# Patient Record
Sex: Male | Born: 1980 | Race: White | Hispanic: No | Marital: Single | Smoking: Current every day smoker
Health system: Southern US, Community
[De-identification: ages and names within clinical notes are randomized; demographics above are authoritative.]

## PROBLEM LIST (undated history)

## (undated) DIAGNOSIS — F17200 Nicotine dependence, unspecified, uncomplicated: Secondary | ICD-10-CM

## (undated) DIAGNOSIS — M549 Dorsalgia, unspecified: Secondary | ICD-10-CM

## (undated) DIAGNOSIS — G8929 Other chronic pain: Secondary | ICD-10-CM

## (undated) DIAGNOSIS — F1910 Other psychoactive substance abuse, uncomplicated: Secondary | ICD-10-CM

## (undated) DIAGNOSIS — B1920 Unspecified viral hepatitis C without hepatic coma: Secondary | ICD-10-CM

## (undated) DIAGNOSIS — F319 Bipolar disorder, unspecified: Secondary | ICD-10-CM

## (undated) HISTORY — DX: Nicotine dependence, unspecified, uncomplicated: F17.200

## (undated) HISTORY — PX: MANDIBLE FRACTURE SURGERY: SHX706

## (undated) HISTORY — PX: ARM AMPUTATION: SUR21

---

## 2013-12-13 ENCOUNTER — Emergency Department (HOSPITAL_COMMUNITY): Payer: Self-pay

## 2013-12-13 ENCOUNTER — Inpatient Hospital Stay (HOSPITAL_COMMUNITY): Payer: MEDICAID

## 2013-12-13 ENCOUNTER — Inpatient Hospital Stay (HOSPITAL_COMMUNITY): Payer: Self-pay

## 2013-12-13 ENCOUNTER — Inpatient Hospital Stay (HOSPITAL_COMMUNITY)
Admission: EM | Admit: 2013-12-13 | Discharge: 2013-12-18 | DRG: 917 | Disposition: A | Discharge status: Home / Self Care | Payer: Self-pay | Attending: Pulmonary Disease | Admitting: Pulmonary Disease

## 2013-12-13 ENCOUNTER — Encounter (HOSPITAL_COMMUNITY): Payer: Self-pay | Admitting: Emergency Medicine

## 2013-12-13 DIAGNOSIS — J690 Pneumonitis due to inhalation of food and vomit: Secondary | ICD-10-CM | POA: Diagnosis present

## 2013-12-13 DIAGNOSIS — R7309 Other abnormal glucose: Secondary | ICD-10-CM | POA: Diagnosis present

## 2013-12-13 DIAGNOSIS — E876 Hypokalemia: Secondary | ICD-10-CM | POA: Diagnosis present

## 2013-12-13 DIAGNOSIS — D649 Anemia, unspecified: Secondary | ICD-10-CM | POA: Diagnosis present

## 2013-12-13 DIAGNOSIS — T40601A Poisoning by unspecified narcotics, accidental (unintentional), initial encounter: Secondary | ICD-10-CM | POA: Diagnosis present

## 2013-12-13 DIAGNOSIS — D696 Thrombocytopenia, unspecified: Secondary | ICD-10-CM | POA: Diagnosis present

## 2013-12-13 DIAGNOSIS — J9600 Acute respiratory failure, unspecified whether with hypoxia or hypercapnia: Secondary | ICD-10-CM | POA: Diagnosis present

## 2013-12-13 DIAGNOSIS — J9690 Respiratory failure, unspecified, unspecified whether with hypoxia or hypercapnia: Secondary | ICD-10-CM

## 2013-12-13 DIAGNOSIS — J019 Acute sinusitis, unspecified: Secondary | ICD-10-CM | POA: Diagnosis present

## 2013-12-13 DIAGNOSIS — T401X1A Poisoning by heroin, accidental (unintentional), initial encounter: Secondary | ICD-10-CM

## 2013-12-13 DIAGNOSIS — F1110 Opioid abuse, uncomplicated: Secondary | ICD-10-CM | POA: Diagnosis present

## 2013-12-13 DIAGNOSIS — J9621 Acute and chronic respiratory failure with hypoxia: Secondary | ICD-10-CM

## 2013-12-13 DIAGNOSIS — E8809 Other disorders of plasma-protein metabolism, not elsewhere classified: Secondary | ICD-10-CM | POA: Diagnosis present

## 2013-12-13 DIAGNOSIS — F329 Major depressive disorder, single episode, unspecified: Secondary | ICD-10-CM | POA: Diagnosis present

## 2013-12-13 DIAGNOSIS — E872 Acidosis, unspecified: Secondary | ICD-10-CM | POA: Diagnosis present

## 2013-12-13 DIAGNOSIS — B1920 Unspecified viral hepatitis C without hepatic coma: Secondary | ICD-10-CM | POA: Diagnosis present

## 2013-12-13 DIAGNOSIS — F3289 Other specified depressive episodes: Secondary | ICD-10-CM | POA: Diagnosis present

## 2013-12-13 DIAGNOSIS — G9340 Encephalopathy, unspecified: Secondary | ICD-10-CM | POA: Diagnosis present

## 2013-12-13 DIAGNOSIS — I10 Essential (primary) hypertension: Secondary | ICD-10-CM | POA: Diagnosis present

## 2013-12-13 DIAGNOSIS — T401X4A Poisoning by heroin, undetermined, initial encounter: Principal | ICD-10-CM | POA: Diagnosis present

## 2013-12-13 DIAGNOSIS — I959 Hypotension, unspecified: Secondary | ICD-10-CM | POA: Diagnosis present

## 2013-12-13 LAB — URINALYSIS, ROUTINE W REFLEX MICROSCOPIC
Bilirubin Urine: NEGATIVE
Hgb urine dipstick: NEGATIVE
KETONES UR: NEGATIVE mg/dL
LEUKOCYTES UA: NEGATIVE
NITRITE: NEGATIVE
PH: 5.5 (ref 5.0–8.0)
Protein, ur: NEGATIVE mg/dL
SPECIFIC GRAVITY, URINE: 1.022 (ref 1.005–1.030)
Urobilinogen, UA: 0.2 mg/dL (ref 0.0–1.0)

## 2013-12-13 LAB — COMPREHENSIVE METABOLIC PANEL
ALT: 29 U/L (ref 0–53)
AST: 51 U/L — ABNORMAL HIGH (ref 0–37)
Albumin: 3.5 g/dL (ref 3.5–5.2)
Alkaline Phosphatase: 74 U/L (ref 39–117)
BUN: 15 mg/dL (ref 6–23)
CO2: 23 meq/L (ref 19–32)
Calcium: 8.8 mg/dL (ref 8.4–10.5)
Chloride: 95 mEq/L — ABNORMAL LOW (ref 96–112)
Creatinine, Ser: 1.12 mg/dL (ref 0.50–1.35)
GFR calc non Af Amer: 84 mL/min — ABNORMAL LOW (ref >90)
Glucose, Bld: 221 mg/dL — ABNORMAL HIGH (ref 70–99)
POTASSIUM: 3.7 meq/L (ref 3.7–5.3)
Sodium: 136 mEq/L — ABNORMAL LOW (ref 137–147)
TOTAL PROTEIN: 6.5 g/dL (ref 6.0–8.3)
Total Bilirubin: 0.3 mg/dL (ref 0.3–1.2)

## 2013-12-13 LAB — CBC WITH DIFFERENTIAL/PLATELET
Basophils Absolute: 0 10*3/uL (ref 0.0–0.1)
Basophils Relative: 0 % (ref 0–1)
EOS ABS: 0.3 10*3/uL (ref 0.0–0.7)
EOS PCT: 2 % (ref 0–5)
HCT: 45.5 % (ref 39.0–52.0)
HEMOGLOBIN: 15.8 g/dL (ref 13.0–17.0)
LYMPHS PCT: 39 % (ref 12–46)
Lymphs Abs: 4.6 10*3/uL — ABNORMAL HIGH (ref 0.7–4.0)
MCH: 31.9 pg (ref 26.0–34.0)
MCHC: 34.7 g/dL (ref 30.0–36.0)
MCV: 91.7 fL (ref 78.0–100.0)
MONOS PCT: 8 % (ref 3–12)
Monocytes Absolute: 0.9 10*3/uL (ref 0.1–1.0)
Neutro Abs: 6.2 10*3/uL (ref 1.7–7.7)
Neutrophils Relative %: 51 % (ref 43–77)
Platelets: 218 10*3/uL (ref 150–400)
RBC: 4.96 MIL/uL (ref 4.22–5.81)
RDW: 12.4 % (ref 11.5–15.5)
WBC: 12 10*3/uL — AB (ref 4.0–10.5)

## 2013-12-13 LAB — I-STAT ARTERIAL BLOOD GAS, ED
ACID-BASE EXCESS: 1 mmol/L (ref 0.0–2.0)
BICARBONATE: 28.6 meq/L — AB (ref 20.0–24.0)
O2 SAT: 100 %
PO2 ART: 271 mmHg — AB (ref 80.0–100.0)
Patient temperature: 98.6
TCO2: 30 mmol/L (ref 0–100)
pCO2 arterial: 54.6 mmHg — ABNORMAL HIGH (ref 35.0–45.0)
pH, Arterial: 7.327 — ABNORMAL LOW (ref 7.350–7.450)

## 2013-12-13 LAB — GLUCOSE, CAPILLARY
GLUCOSE-CAPILLARY: 188 mg/dL — AB (ref 70–99)
GLUCOSE-CAPILLARY: 71 mg/dL (ref 70–99)
GLUCOSE-CAPILLARY: 88 mg/dL (ref 70–99)

## 2013-12-13 LAB — TROPONIN I
Troponin I: <0.3 ng/mL (ref <0.30)
Troponin I: <0.3 ng/mL (ref <0.30)
Troponin I: <0.3 ng/mL (ref <0.30)

## 2013-12-13 LAB — URINE MICROSCOPIC-ADD ON

## 2013-12-13 LAB — RAPID URINE DRUG SCREEN, HOSP PERFORMED
Amphetamines: NOT DETECTED
BARBITURATES: NOT DETECTED
BENZODIAZEPINES: NOT DETECTED
Cocaine: NOT DETECTED
Opiates: POSITIVE — AB
Tetrahydrocannabinol: NOT DETECTED

## 2013-12-13 LAB — MRSA PCR SCREENING: MRSA BY PCR: NEGATIVE

## 2013-12-13 LAB — LACTIC ACID, PLASMA
LACTIC ACID, VENOUS: 5.3 mmol/L — AB (ref 0.5–2.2)
LACTIC ACID, VENOUS: 1.6 mmol/L (ref 0.5–2.2)
Lactic Acid, Venous: 1.5 mmol/L (ref 0.5–2.2)

## 2013-12-13 MED ORDER — MIDAZOLAM HCL 2 MG/2ML IJ SOLN
INTRAMUSCULAR | Status: AC
  Administered 2013-12-13: 2 mg via INTRAVENOUS
  Filled 2013-12-13: qty 2

## 2013-12-13 MED ORDER — PANTOPRAZOLE SODIUM 40 MG IV SOLR
40.0000 mg | Freq: Every day | INTRAVENOUS | Status: DC
  Administered 2013-12-13 – 2013-12-14 (×2): 40 mg via INTRAVENOUS
  Filled 2013-12-13 (×4): qty 40

## 2013-12-13 MED ORDER — SODIUM CHLORIDE 0.9 % IV SOLN
INTRAVENOUS | Status: DC
  Administered 2013-12-13: 12:00:00 via INTRAVENOUS
  Administered 2013-12-13: 1000 mL via INTRAVENOUS
  Administered 2013-12-13 – 2013-12-14 (×2): via INTRAVENOUS
  Administered 2013-12-14: 1000 mL via INTRAVENOUS
  Administered 2013-12-15 – 2013-12-16 (×3): via INTRAVENOUS

## 2013-12-13 MED ORDER — PROPOFOL 10 MG/ML IV EMUL
INTRAVENOUS | Status: AC
  Administered 2013-12-13: 10 ug/kg/min via INTRAVENOUS
  Filled 2013-12-13: qty 100

## 2013-12-13 MED ORDER — SODIUM CHLORIDE 0.9 % IV SOLN
25.0000 ug/h | INTRAVENOUS | Status: DC
  Administered 2013-12-13: 100 ug/h via INTRAVENOUS
  Filled 2013-12-13 (×2): qty 50

## 2013-12-13 MED ORDER — MIDAZOLAM HCL 5 MG/ML IJ SOLN: 4.0000 mg | INTRAMUSCULAR | Status: DC | PRN

## 2013-12-13 MED ORDER — BIOTENE DRY MOUTH MT LIQD
15.0000 mL | Freq: Four times a day (QID) | OROMUCOSAL | Status: DC
  Administered 2013-12-13 – 2013-12-17 (×16): 15 mL via OROMUCOSAL

## 2013-12-13 MED ORDER — ENOXAPARIN SODIUM 40 MG/0.4ML ~~LOC~~ SOLN
40.0000 mg | SUBCUTANEOUS | Status: DC
  Administered 2013-12-13 – 2013-12-15 (×3): 40 mg via SUBCUTANEOUS
  Filled 2013-12-13 (×4): qty 0.4

## 2013-12-13 MED ORDER — MIDAZOLAM HCL 2 MG/2ML IJ SOLN
INTRAMUSCULAR | Status: AC
  Administered 2013-12-13: 2 mg
  Filled 2013-12-13: qty 8

## 2013-12-13 MED ORDER — METOPROLOL TARTRATE 1 MG/ML IV SOLN: 2.5000 mg | INTRAVENOUS | Status: DC | PRN

## 2013-12-13 MED ORDER — ACETAMINOPHEN 160 MG/5ML PO SOLN
650.0000 mg | Freq: Once | ORAL | Status: AC
  Administered 2013-12-13: 650 mg
  Filled 2013-12-13: qty 20.3

## 2013-12-13 MED ORDER — SODIUM CHLORIDE 0.9 % IV SOLN: 250.0000 mL | INTRAVENOUS | Status: DC | PRN

## 2013-12-13 MED ORDER — FENTANYL CITRATE 0.05 MG/ML IJ SOLN
100.0000 ug | INTRAMUSCULAR | Status: DC | PRN
  Administered 2013-12-14 – 2013-12-16 (×12): 100 ug via INTRAVENOUS
  Filled 2013-12-13 (×13): qty 2

## 2013-12-13 MED ORDER — PROPOFOL 10 MG/ML IV EMUL
5.0000 ug/kg/min | Freq: Once | INTRAVENOUS | Status: AC
  Administered 2013-12-13: 10 ug/kg/min via INTRAVENOUS

## 2013-12-13 MED ORDER — NALOXONE HCL 0.4 MG/ML IJ SOLN
0.4000 mg | Freq: Once | INTRAMUSCULAR | Status: AC
  Administered 2013-12-13: 0.4 mg via INTRAVENOUS

## 2013-12-13 MED ORDER — INSULIN ASPART 100 UNIT/ML ~~LOC~~ SOLN
0.0000 [IU] | SUBCUTANEOUS | Status: DC
  Administered 2013-12-15 (×2): 2 [IU] via SUBCUTANEOUS

## 2013-12-13 MED ORDER — MIDAZOLAM HCL 2 MG/2ML IJ SOLN
INTRAMUSCULAR | Status: AC
  Administered 2013-12-13: 4 mg
  Filled 2013-12-13: qty 4

## 2013-12-13 MED ORDER — CHLORHEXIDINE GLUCONATE 0.12 % MT SOLN
15.0000 mL | Freq: Two times a day (BID) | OROMUCOSAL | Status: DC
  Administered 2013-12-13 – 2013-12-17 (×6): 15 mL via OROMUCOSAL
  Filled 2013-12-13 (×7): qty 15

## 2013-12-13 MED ORDER — ETOMIDATE 2 MG/ML IV SOLN: 0.3000 mg/kg | Freq: Once | INTRAVENOUS | Status: DC

## 2013-12-13 MED ORDER — MIDAZOLAM HCL 2 MG/2ML IJ SOLN
2.0000 mg | Freq: Once | INTRAMUSCULAR | Status: AC
  Administered 2013-12-13: 2 mg via INTRAVENOUS

## 2013-12-13 MED ORDER — ETOMIDATE 2 MG/ML IV SOLN
20.0000 mg | Freq: Once | INTRAVENOUS | Status: AC
  Administered 2013-12-13: 20 mg via INTRAVENOUS

## 2013-12-13 MED ORDER — SODIUM CHLORIDE 0.9 % IV SOLN
Freq: Once | INTRAVENOUS | Status: AC
  Administered 2013-12-13: 10:00:00 via INTRAVENOUS

## 2013-12-13 MED ORDER — ROCURONIUM BROMIDE 50 MG/5ML IV SOLN
1.0000 mg/kg | Freq: Once | INTRAVENOUS | Status: AC
  Administered 2013-12-13: 70 mg via INTRAVENOUS

## 2013-12-13 MED ORDER — PROPOFOL 10 MG/ML IV EMUL
INTRAVENOUS | Status: AC
  Administered 2013-12-13: 20 ug/kg/min via INTRAVENOUS
  Filled 2013-12-13: qty 100

## 2013-12-13 MED ORDER — PROPOFOL 10 MG/ML IV EMUL
5.0000 ug/kg/min | INTRAVENOUS | Status: DC
  Administered 2013-12-13: 50 ug/kg/min via INTRAVENOUS
  Administered 2013-12-13 (×2): 70 ug/kg/min via INTRAVENOUS
  Administered 2013-12-13: 20 ug/kg/min via INTRAVENOUS
  Administered 2013-12-14: 50 ug/kg/min via INTRAVENOUS
  Administered 2013-12-14 (×3): 70 ug/kg/min via INTRAVENOUS
  Administered 2013-12-14: 40 ug/kg/min via INTRAVENOUS
  Administered 2013-12-14: 70 ug/kg/min via INTRAVENOUS
  Administered 2013-12-15: 60 ug/kg/min via INTRAVENOUS
  Administered 2013-12-15 (×3): 70 ug/kg/min via INTRAVENOUS
  Administered 2013-12-15: 30 ug/kg/min via INTRAVENOUS
  Administered 2013-12-15 – 2013-12-16 (×4): 70 ug/kg/min via INTRAVENOUS
  Filled 2013-12-13 (×21): qty 100

## 2013-12-13 NOTE — Progress Notes (Signed)
Pt transported to 2M11, report given to RT. No apparent complications noted.
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
## 2013-12-13 NOTE — ED Notes (Signed)
Propofol turned down to 6010mcg/kg from 30 mcg/kg to to drop in B/p Iv fluids infusing at this time
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
## 2013-12-13 NOTE — ED Notes (Signed)
Friends of pt at bedside , updated on pt status

## 2013-12-14 ENCOUNTER — Encounter (HOSPITAL_COMMUNITY): Payer: Self-pay | Admitting: Certified Registered Nurse Anesthetist

## 2013-12-14 ENCOUNTER — Inpatient Hospital Stay (HOSPITAL_COMMUNITY): Payer: Self-pay

## 2013-12-14 DIAGNOSIS — J690 Pneumonitis due to inhalation of food and vomit: Secondary | ICD-10-CM

## 2013-12-14 LAB — CBC
HEMATOCRIT: 42.9 % (ref 39.0–52.0)
Hemoglobin: 14.6 g/dL (ref 13.0–17.0)
MCH: 30.9 pg (ref 26.0–34.0)
MCHC: 34 g/dL (ref 30.0–36.0)
MCV: 90.9 fL (ref 78.0–100.0)
Platelets: 136 10*3/uL — ABNORMAL LOW (ref 150–400)
RBC: 4.72 MIL/uL (ref 4.22–5.81)
RDW: 12.6 % (ref 11.5–15.5)
WBC: 21.5 10*3/uL — ABNORMAL HIGH (ref 4.0–10.5)

## 2013-12-14 LAB — BASIC METABOLIC PANEL
BUN: 11 mg/dL (ref 6–23)
CO2: 22 mEq/L (ref 19–32)
Calcium: 8.4 mg/dL (ref 8.4–10.5)
Chloride: 105 mEq/L (ref 96–112)
Creatinine, Ser: 0.85 mg/dL (ref 0.50–1.35)
Glucose, Bld: 80 mg/dL (ref 70–99)
Potassium: 4 mEq/L (ref 3.7–5.3)
SODIUM: 140 meq/L (ref 137–147)

## 2013-12-14 LAB — TRIGLYCERIDES: TRIGLYCERIDES: 112 mg/dL (ref <150)

## 2013-12-14 LAB — GLUCOSE, CAPILLARY
GLUCOSE-CAPILLARY: 93 mg/dL (ref 70–99)
GLUCOSE-CAPILLARY: 125 mg/dL — AB (ref 70–99)
GLUCOSE-CAPILLARY: 94 mg/dL (ref 70–99)
Glucose-Capillary: 103 mg/dL — ABNORMAL HIGH (ref 70–99)
Glucose-Capillary: 79 mg/dL (ref 70–99)
Glucose-Capillary: 87 mg/dL (ref 70–99)

## 2013-12-14 LAB — BLOOD GAS, ARTERIAL
Acid-Base Excess: 2 mmol/L (ref 0.0–2.0)
Bicarbonate: 25.8 mEq/L — ABNORMAL HIGH (ref 20.0–24.0)
DRAWN BY: 10006
FIO2: 40 %
LHR: 16 {breaths}/min
O2 Saturation: 92.4 %
PCO2 ART: 40.5 mmHg (ref 35.0–45.0)
PEEP: 5 cmH2O
PH ART: 7.426 (ref 7.350–7.450)
Patient temperature: 100.3
TCO2: 27 mmol/L (ref 0–100)
VT: 640 mL
pO2, Arterial: 64.6 mmHg — ABNORMAL LOW (ref 80.0–100.0)

## 2013-12-14 LAB — LACTIC ACID, PLASMA: LACTIC ACID, VENOUS: 1.8 mmol/L (ref 0.5–2.2)

## 2013-12-14 LAB — PHOSPHORUS
PHOSPHORUS: 1.4 mg/dL — AB (ref 2.3–4.6)
Phosphorus: 1.8 mg/dL — ABNORMAL LOW (ref 2.3–4.6)
Phosphorus: 1.5 mg/dL — ABNORMAL LOW (ref 2.3–4.6)

## 2013-12-14 LAB — MAGNESIUM
Magnesium: 1.8 mg/dL (ref 1.5–2.5)
Magnesium: 2.4 mg/dL (ref 1.5–2.5)
Magnesium: 2 mg/dL (ref 1.5–2.5)

## 2013-12-14 LAB — STREP PNEUMONIAE URINARY ANTIGEN: STREP PNEUMO URINARY ANTIGEN: NEGATIVE

## 2013-12-14 LAB — PROCALCITONIN: PROCALCITONIN: 3.72 ng/mL

## 2013-12-14 MED ORDER — VANCOMYCIN HCL IN DEXTROSE 1-5 GM/200ML-% IV SOLN
1000.0000 mg | Freq: Three times a day (TID) | INTRAVENOUS | Status: DC
  Administered 2013-12-15 – 2013-12-16 (×4): 1000 mg via INTRAVENOUS
  Filled 2013-12-14 (×6): qty 200

## 2013-12-14 MED ORDER — ACETAMINOPHEN 160 MG/5ML PO SOLN
650.0000 mg | Freq: Four times a day (QID) | ORAL | Status: DC | PRN
  Administered 2013-12-14 – 2013-12-15 (×3): 650 mg
  Filled 2013-12-14 (×3): qty 20.3

## 2013-12-14 MED ORDER — PRO-STAT SUGAR FREE PO LIQD
30.0000 mL | Freq: Two times a day (BID) | ORAL | Status: DC
  Filled 2013-12-14 (×2): qty 30

## 2013-12-14 MED ORDER — SODIUM PHOSPHATE 3 MMOLE/ML IV SOLN
10.0000 mmol | Freq: Once | INTRAVENOUS | Status: AC
  Administered 2013-12-14: 10 mmol via INTRAVENOUS
  Filled 2013-12-14: qty 3.33

## 2013-12-14 MED ORDER — LEVOFLOXACIN IN D5W 750 MG/150ML IV SOLN
750.0000 mg | INTRAVENOUS | Status: DC
  Administered 2013-12-14 – 2013-12-17 (×4): 750 mg via INTRAVENOUS
  Filled 2013-12-14 (×5): qty 150

## 2013-12-14 MED ORDER — VANCOMYCIN HCL 10 G IV SOLR
1750.0000 mg | Freq: Once | INTRAVENOUS | Status: AC
  Administered 2013-12-14: 1750 mg via INTRAVENOUS
  Filled 2013-12-14: qty 1750

## 2013-12-14 MED ORDER — VITAL HIGH PROTEIN PO LIQD
1000.0000 mL | ORAL | Status: DC
  Filled 2013-12-14 (×2): qty 1000

## 2013-12-14 MED ORDER — MAGNESIUM SULFATE 40 MG/ML IJ SOLN
2.0000 g | Freq: Once | INTRAMUSCULAR | Status: AC
  Administered 2013-12-14: 2 g via INTRAVENOUS
  Filled 2013-12-14: qty 50

## 2013-12-14 MED ORDER — VITAL AF 1.2 CAL PO LIQD
1000.0000 mL | ORAL | Status: DC
  Administered 2013-12-14 – 2013-12-15 (×2): 1000 mL
  Filled 2013-12-14 (×7): qty 1000

## 2013-12-14 NOTE — Clinical Social Work Note (Signed)
CSW met with fiance and pt/fiance friend Pi in conference room on 37M.  Assessment to follow.  Nonnie Done, [place] [phone]  Clinical Social Work

## 2013-12-14 NOTE — Progress Notes (Signed)
ANTIBIOTIC CONSULT NOTE - INITIAL  Pharmacy Consult for Vancomcyin Indication:  Sepsis and ongoing Fevers  Allergies  Allergen Reactions  . Penicillins     Unknown-childhood allergy   Patient Measurements: Height: 6\' 1"  (185.4 cm) Weight: 178 lb 5.6 oz (80.9 kg) IBW/kg (Calculated) : 79.9  Vital Signs: Temp: 100.1 F (37.8 C) (04/09 1500) Temp src: Core (Comment) (04/09 1200) BP: 123/65 mmHg (04/09 1531) Pulse Rate: 81 (04/09 1531) Intake/Output from previous day: 04/08 0701 - 04/09 0700 In: 2895.1 [I.V.:2895.1] Out: 1375 [Urine:1375] Intake/Output from this shift: Total I/O In: 1703.1 [I.V.:1199.8; NG/GT:50; IV Piggyback:453.3] Out: 655 [Urine:655]  Labs:  Recent Labs  12/13/13 0956 12/14/13 0411  WBC 12.0* 21.5*  HGB 15.8 14.6  PLT 218 136*  CREATININE 1.12 0.85   Estimated Creatinine Clearance: 138.4 ml/min (by C-G formula based on Cr of 0.85).  Microbiology: Recent Results (from the past 720 hour(s))  MRSA PCR SCREENING     Status: None   Collection Time    12/13/13 12:26 PM      Result Value Ref Range Status   MRSA by PCR NEGATIVE  NEGATIVE Final   Comment:            The GeneXpert MRSA Assay (FDA     approved for NASAL specimens     only), is one component of a     comprehensive MRSA colonization     surveillance program. It is not     intended to diagnose MRSA     infection nor to guide or     monitor treatment for     MRSA infections.   Medical History: Past Medical History  Diagnosis Date  . Smoker     Medications:  Anti-infectives   Start     Dose/Rate Route Frequency Ordered Stop   12/14/13 1545  vancomycin (VANCOCIN) 1,750 mg in sodium chloride 0.9 % 500 mL IVPB     1,750 mg 250 mL/hr over 120 Minutes Intravenous  Once 12/14/13 1536     12/14/13 1130  levofloxacin (LEVAQUIN) IVPB 750 mg     750 mg 100 mL/hr over 90 Minutes Intravenous Every 24 hours 12/14/13 1057       Assessment: 32yo male with multiple medical problems that  continues to have fever.  He has had IV levofloxacin today around 11AM.  We have been asked to broaden his IV antibiotics to include Vancomycin.  He has a creatinine of 0.85 and an estimated crcl of 138 ml/min.  He continues to have some leukocytosis with WBC = 21.5K.  He remains intubated with recent cxr findings of bilateral lung infiltrates.  Goal of Therapy:  Vancomycin trough level 15-20 mcg/ml  Plan:  1.  Vancomycin 1750 mg IV x 1 2.  Begin Vancomycin 1000 mg every 8 hours 3.  Will check s/s level and adjust 4.  Monitor renal function  Nadara MustardNita Wisam Siefring, PharmD., MS Clinical Pharmacist Pager:  [phone] Thank you for allowing pharmacy to be part of this patients care team. 12/14/2013,3:50 PM

## 2013-12-14 NOTE — Progress Notes (Signed)
INITIAL NUTRITION ASSESSMENT  DOCUMENTATION CODES Per approved criteria  -Not Applicable   INTERVENTION:  Utilize 47M PEPuP Protocol: initiate TF via OGT with Vital AF 1.2 at 25 ml/h on day 1; on day 2, increase to goal rate of 65 ml/h (1560 ml per day) to provide 1872 kcals, 117 gm protein, 1265 ml free water daily.  Above TF regimen plus current propofol will provide a total of 2503 kcals, 117 gm protein, 1265 ml free water daily.  NUTRITION DIAGNOSIS: Inadequate oral intake related to inability to eat as evidenced by NPO status.   Goal: Intake to meet >90% of estimated nutrition needs.  Monitor:  TF tolerance/adequacy, weight trend, labs, vent status.  Reason for Assessment: MD Consult for TF initiation and management.  33 y.o. male  Admitting Dx: suspected heroin overdose  ASSESSMENT: Patient is a 33 yo M with unknown PMH brought to ED on 4/8 after his girlfriend found him unresponsive, suspected heroine overdose. In ED received 2 doses of Narcan without significant improvement and was intubated.   Patient is currently intubated on ventilator support. Received MD Consult for TF initiation and management. Nutrition focused physical exam completed.  No muscle or subcutaneous fat depletion noticed. MV: 9.1 L/min Temp (24hrs), Avg:101 F (38.3 C), Min:98.4 F (36.9 C), Max:104.6 F (40.3 C)  Propofol: 23.9 ml/hr providing 631 kcals/day.  Height: Ht Readings from Last 1 Encounters:  12/13/13 6\' 1"  (1.854 m)    Weight: Wt Readings from Last 1 Encounters:  12/14/13 178 lb 5.6 oz (80.9 kg)    Ideal Body Weight: 83.6 kg  % Ideal Body Weight: 97%  Wt Readings from Last 10 Encounters:  12/14/13 178 lb 5.6 oz (80.9 kg)    Usual Body Weight: unknown  % Usual Body Weight: N/A  BMI:  Body mass index is 23.54 kg/(m^2).  Estimated Nutritional Needs: Kcal: 2540 Protein: 115-130 gm Fluid: 2.3-2.5 L  Skin: no wounds  Diet Order: NPO  EDUCATION NEEDS: -Education  not appropriate at this time   Intake/Output Summary (Last 24 hours) at 12/14/13 1119 Last data filed at 12/14/13 1000  Gross per 24 hour  Intake 3178.4 ml  Output   1510 ml  Net 1668.4 ml    Last BM: PTA   Labs:   Recent Labs Lab 12/13/13 0956 12/14/13 0411  NA 136* 140  K 3.7 4.0  CL 95* 105  CO2 23 22  BUN 15 11  CREATININE 1.12 0.85  CALCIUM 8.8 8.4  MG  --  1.8  PHOS  --  1.8*  GLUCOSE 221* 80    CBG (last 3)   Recent Labs  12/14/13 0024 12/14/13 0413 12/14/13 0837  GLUCAP 87 103* 79    Scheduled Meds: . antiseptic oral rinse  15 mL Mouth Rinse QID  . chlorhexidine  15 mL Mouth Rinse BID  . enoxaparin (LOVENOX) injection  40 mg Subcutaneous Q24H  . feeding supplement (PRO-STAT SUGAR FREE 64)  30 mL Per Tube BID  . feeding supplement (VITAL HIGH PROTEIN)  1,000 mL Per Tube Q24H  . insulin aspart  0-15 Units Subcutaneous 6 times per day  . levofloxacin (LEVAQUIN) IV  750 mg Intravenous Q24H  . magnesium sulfate 1 - 4 g bolus IVPB  2 g Intravenous Once  . pantoprazole (PROTONIX) IV  40 mg Intravenous QHS  . sodium phosphate  Dextrose 5% IVPB  10 mmol Intravenous Once    Continuous Infusions: . sodium chloride 1,000 mL (12/14/13 0820)  . propofol  40 mcg/kg/min (12/14/13 0831)    Past Medical History  Diagnosis Date  . Smoker     History reviewed. No pertinent past surgical history.   Joaquin Courts, RD, LDN, CNSC Pager [phone] After Hours Pager [phone]
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
## 2013-12-14 NOTE — Progress Notes (Signed)
PULMONARY / CRITICAL CARE MEDICINE   Name: Oscar Burke MRN: [id number] DOB: 03-Sep-1981    ADMISSION DATE:  12/13/2013 CONSULTATION DATE:  12/13/2013  REFERRING MD : EDP PRIMARY SERVICE: PCCM  CHIEF COMPLAINT:  Suspected heroine overdose  BRIEF PATIENT DESCRIPTION: 33 yo M with unknown PMH brought to ED after his girlfriend found him unresponsive, suspected heroine overdose. In ED received 2 doses of Narcan without significant improvement and was intubated. PCCM was consulted.  SIGNIFICANT EVENTS / STUDIES:  4/8 Admitted for suspected heroine overdose, responded to narcan. Hypertensive with persistent fever(Tmax 104.6) 4/8 Head CT with no acute abnormality. Acute on chronic sinusitis  4/8 CXR Increased density in the right perihilar region, representing possible subsegmental atelectasis or pneumonia 4/9 CXR Development of bilateral pulmonary alveolar infiltrates, particularly left lower lobe  LINES / TUBES: ETT 4/8 >>  OGT 4/8 >>  Foley 4/8 >>  CULTURES: MRSA 4/8 NEG UC 4/8 NG BC 4/9 >> RC (tracheal) 4/9 >>  ANTIBIOTICS: None  SUBJECTIVE:  No acute events overnight. Pt currently sedated.   VITAL SIGNS: Temp:  [98.4 F (36.9 C)-104.6 F (40.3 C)] 100.2 F (37.9 C) (04/09 0900) Pulse Rate:  [68-142] 92 (04/09 0900) Resp:  [14-30] 21 (04/09 0900) BP: (84-195)/(42-116) 123/69 mmHg (04/09 0900) SpO2:  [92 %-100 %] 97 % (04/09 0900) FiO2 (%):  [40 %-100 %] 40 % (04/09 0900) Weight:  [79.2 kg (174 lb 9.7 oz)-80.9 kg (178 lb 5.6 oz)] 80.9 kg (178 lb 5.6 oz) (04/09 0500) HEMODYNAMICS:   VENTILATOR SETTINGS: Vent Mode:  [-] CPAP;PSV FiO2 (%):  [40 %-100 %] 40 % Set Rate:  [16 bmp] 16 bmp Vt Set:  [640 mL] 640 mL PEEP:  [5 cmH20] 5 cmH20 Plateau Pressure:  [9 cmH20-19 cmH20] 9 cmH20 INTAKE / OUTPUT: Intake/Output     04/08 0701 - 04/09 0700 04/09 0701 - 04/10 0700   I.V. (mL/kg) 2895.1 (35.8) 283.3 (3.5)   Total Intake(mL/kg) 2895.1 (35.8) 283.3 (3.5)   Urine  (mL/kg/hr) 1375 75 (0.3)   Total Output 1375 75   Net +1520.1 +208.3          PHYSICAL EXAMINATION: General: Intubated Neuro: Sedated, rass 2 to -2, not following commands HEENT: OETT / OGT  Cardiovascular: Normal rate and rhythm Lungs: Diminished breath sounds  Abdomen: Soft, nontender, bowel sounds diminished  Musculoskeletal: SCD's,  no edema  Skin: Intact. Multiple tattoos throughout body.    LABS:  CBC  Recent Labs Lab 12/13/13 0956 12/14/13 0411  WBC 12.0* 21.5*  HGB 15.8 14.6  HCT 45.5 42.9  PLT 218 136*   BMET  Recent Labs Lab 12/13/13 0956 12/14/13 0411  NA 136* 140  K 3.7 4.0  CL 95* 105  CO2 23 22  BUN 15 11  CREATININE 1.12 0.85  GLUCOSE 221* 80   Electrolytes  Recent Labs Lab 12/13/13 0956 12/14/13 0411  CALCIUM 8.8 8.4  MG  --  1.8  PHOS  --  1.8*   Sepsis Markers  Recent Labs Lab 12/13/13 1640 12/13/13 2240 12/14/13 0411  LATICACIDVEN 1.6 1.5 1.8   ABG  Recent Labs Lab 12/13/13 1055 12/14/13 0620  PHART 7.327* 7.426  PCO2ART 54.6* 40.5  PO2ART 271.0* 64.6*   Liver Enzymes  Recent Labs Lab 12/13/13 0956  AST 51*  ALT 29  ALKPHOS 74  BILITOT 0.3  ALBUMIN 3.5   Cardiac Enzymes  Recent Labs Lab 12/13/13 0956 12/13/13 1600 12/13/13 2222  TROPONINI <0.30 <0.30 <0.30   Glucose  Recent  Labs Lab 12/13/13 1219 12/13/13 1549 12/13/13 1920 12/14/13 0024 12/14/13 0413 12/14/13 0837  GLUCAP 188* 71 88 87 103* 79    Imaging Ct Head Wo Contrast  12/13/2013   CLINICAL DATA:  Overdose.  EXAM: CT HEAD WITHOUT CONTRAST  TECHNIQUE: Contiguous axial images were obtained from the base of the skull through the vertex without intravenous contrast.  COMPARISON:  None.  FINDINGS: No acute cortical infarct, hemorrhage, or mass lesion ispresent. Ventricles are of normal size. No significant extra-axial fluid collection is present. There is an air-fluid level identified within the left maxillary sinus. Mucosal thickening  involving the ethmoid air cells and sphenoid sinus also noted. The mastoid air cells appear clear. The skull is intact. The osseous skull is intact.  IMPRESSION: 1. No acute intracranial abnormalities. 2. Acute on chronic sinus inflammation.   Electronically Signed   By: Signa Kell M.D.   On: 12/13/2013 18:33   Dg Chest Port 1 View  12/14/2013   CLINICAL DATA:  Intubation.  EXAM: PORTABLE CHEST - 1 VIEW  COMPARISON:  DG CHEST 1V PORT dated 12/13/2013  FINDINGS: Endotracheal tube and NG tube in stable position. Mediastinum hilar structures are unremarkable. Heart size and pulmonary vascularity normal. Interval development of bilateral pulmonary infiltrates particularly in the left lower lobe. Pneumonia could present in this fashion. Acute pulmonary edema could present in this fashion. No pleural effusion or pneumothorax. No acute osseous abnormality.  IMPRESSION: 1. Endotracheal tube and NG tube in stable position. 2. Interval development of bilateral pulmonary alveolar infiltrates particularly left lower lobe. 3. Heart size and pulmonary vascularity normal.   Electronically Signed   By: Maisie Fus  Register   On: 12/14/2013 07:37   Dg Chest Portable 1 View  12/13/2013   CLINICAL DATA:  Suspected drug overdose  EXAM: PORTABLE CHEST - 1 VIEW  COMPARISON:  None.  FINDINGS: The lungs are adequately inflated. There is hazy increased density in the right mid lung which may reflect subsegmental atelectasis or other alveolar filling processes including pneumonia. There is no shift of the midline. The endotracheal tube tip lies 5.3 cm above the crotch of the carina. The cardiac silhouette is normal in size. The pulmonary vascularity is not engorged. The observed portions of the bony thorax appear normal.  IMPRESSION: 1. Increased density in the right perihilar region may reflect subsegmental atelectasis or pneumonia. 2. The endotracheal tube tip lies 5.3 cm above the crotch of the carina.   Electronically Signed   By:  David  [place]   On: 12/13/2013 11:05   Dg Abd Portable 1v  12/13/2013   CLINICAL DATA:  Orogastric tube placement  EXAM: PORTABLE ABDOMEN - 1 VIEW  COMPARISON:  None.  FINDINGS: Orogastric tube tip is in the proximal stomach. The side port is immediately inferior to the gastroesophageal junction. The bowel gas pattern is unremarkable. No obstruction or free air is seen on this supine examination there is moderate stool in the colon.  IMPRESSION: Orogastric tube tip in proximal stomach. The side port is immediately distal to the gastroesophageal junction. It may be prudent to consider advancing the tube 6 to 8 cm to confirm that the side port remains in the stomach. Bowel gas pattern unremarkable.   Electronically Signed   By: Bretta Bang M.D.   On: 12/13/2013 16:46     ASSESSMENT / PLAN:  PULMONARY  A:  Acute respiratory failure  Bilateral Pulmonary infiltrates    - PNA vs pulmonary edema  Acute on chronic sinusitis  P:  Continue mechanical support with daily SBT, cpap5 ps 5, goal 1 hr Follow pcxr in am  Keep same MV, abg reviewed Neurostatus status does not support extubation  CARDIOVASCULAR  A:  Sinus Tachycardia - resolved Hypotension - resolved P:  Monitor on telemetry  Control fevers  RENAL  A:  Hypophosphatemia, hypomag Lactic acidosis - resolved P:  Currently on NS 125, keep Replace electrolytes as needed Monitor volume status   GASTROINTESTINAL  A:  Nutrition  GI Ppx  Transaminitis - AST, possibly ethanol induced Hep c P:  NPO on IVF's, consider starting TF's IV protonix daily Limit tylenol 2 grams/day   HEMATOLOGIC  A:  Thrombocytopenia  DVT Ppx  P:  SCD  Monitor plt in setting of lovenox use  INFECTIOUS  A:  Leukocytosis with persistent low-grade fever,likely aspiration Acute on chronic sinusitis  History of IVDU   - consider endocarditis  Allergy pcn not well described P:  Follow CXR Monitor CBC  Baseline PCT to limit abx? Consider  addition levofloxacin If spike further add vanc Echo assess veg  ENDOCRINE  A:  Hyperglycemia - resolved P:  Moderate SSI  CBG Q4hr monitoring   NEUROLOGIC  A:  Acute encephalopathy  Substance abuse    -UDS  pos for opiates  Suspected Heroin overdose R/o anoxia during low RR P:  Daily WUA  Cont Propofol gtt  Cont Fentanyl PRN May need MRI if not improved   Marjan Rabbani PGY-1 IMTS  12/14/2013, 9:40 AM   Ccm time 30 min   I have fully examined this patient and agree with above findings.    And edited in full  Mcarthur RossettiDaniel J. Tyson AliasFeinstein, MD, FACP Pgr: [phone] [facility]

## 2013-12-15 ENCOUNTER — Inpatient Hospital Stay (HOSPITAL_COMMUNITY): Payer: Self-pay

## 2013-12-15 DIAGNOSIS — J9600 Acute respiratory failure, unspecified whether with hypoxia or hypercapnia: Secondary | ICD-10-CM

## 2013-12-15 LAB — COMPREHENSIVE METABOLIC PANEL
ALBUMIN: 2.4 g/dL — AB (ref 3.5–5.2)
ALT: 14 U/L (ref 0–53)
AST: 20 U/L (ref 0–37)
Alkaline Phosphatase: 63 U/L (ref 39–117)
BUN: 6 mg/dL (ref 6–23)
CO2: 23 mEq/L (ref 19–32)
Calcium: 8.1 mg/dL — ABNORMAL LOW (ref 8.4–10.5)
Chloride: 104 mEq/L (ref 96–112)
Creatinine, Ser: 0.79 mg/dL (ref 0.50–1.35)
GFR calc Af Amer: >90 mL/min (ref >90)
GFR calc non Af Amer: >90 mL/min (ref >90)
Glucose, Bld: 140 mg/dL — ABNORMAL HIGH (ref 70–99)
POTASSIUM: 3.4 meq/L — AB (ref 3.7–5.3)
Sodium: 139 mEq/L (ref 137–147)
TOTAL PROTEIN: 5.2 g/dL — AB (ref 6.0–8.3)
Total Bilirubin: 0.5 mg/dL (ref 0.3–1.2)

## 2013-12-15 LAB — CBC WITH DIFFERENTIAL/PLATELET
Basophils Absolute: 0 10*3/uL (ref 0.0–0.1)
Basophils Relative: 0 % (ref 0–1)
EOS PCT: 1 % (ref 0–5)
Eosinophils Absolute: 0.2 10*3/uL (ref 0.0–0.7)
HEMATOCRIT: 36.6 % — AB (ref 39.0–52.0)
Hemoglobin: 12.3 g/dL — ABNORMAL LOW (ref 13.0–17.0)
LYMPHS ABS: 1.3 10*3/uL (ref 0.7–4.0)
LYMPHS PCT: 9 % — AB (ref 12–46)
MCH: 30.4 pg (ref 26.0–34.0)
MCHC: 33.6 g/dL (ref 30.0–36.0)
MCV: 90.6 fL (ref 78.0–100.0)
MONO ABS: 1.3 10*3/uL — AB (ref 0.1–1.0)
MONOS PCT: 9 % (ref 3–12)
Neutro Abs: 11.9 10*3/uL — ABNORMAL HIGH (ref 1.7–7.7)
Neutrophils Relative %: 81 % — ABNORMAL HIGH (ref 43–77)
Platelets: 127 10*3/uL — ABNORMAL LOW (ref 150–400)
RBC: 4.04 MIL/uL — AB (ref 4.22–5.81)
RDW: 12.7 % (ref 11.5–15.5)
WBC: 14.7 10*3/uL — ABNORMAL HIGH (ref 4.0–10.5)

## 2013-12-15 LAB — HEPATITIS PANEL, ACUTE
HCV Ab: REACTIVE — AB
HEP A IGM: NONREACTIVE
Hep B C IgM: NONREACTIVE
Hepatitis B Surface Ag: NEGATIVE

## 2013-12-15 LAB — GLUCOSE, CAPILLARY
GLUCOSE-CAPILLARY: 102 mg/dL — AB (ref 70–99)
GLUCOSE-CAPILLARY: 95 mg/dL (ref 70–99)
Glucose-Capillary: 128 mg/dL — ABNORMAL HIGH (ref 70–99)
Glucose-Capillary: 139 mg/dL — ABNORMAL HIGH (ref 70–99)
Glucose-Capillary: 95 mg/dL (ref 70–99)
Glucose-Capillary: 99 mg/dL (ref 70–99)

## 2013-12-15 LAB — MAGNESIUM: Magnesium: 2 mg/dL (ref 1.5–2.5)

## 2013-12-15 LAB — HIV ANTIBODY (ROUTINE TESTING W REFLEX): HIV: NONREACTIVE

## 2013-12-15 LAB — CK: Total CK: 252 U/L — ABNORMAL HIGH (ref 7–232)

## 2013-12-15 LAB — PROCALCITONIN: Procalcitonin: 2.53 ng/mL

## 2013-12-15 LAB — PHOSPHORUS: Phosphorus: 1.4 mg/dL — ABNORMAL LOW (ref 2.3–4.6)

## 2013-12-15 MED ORDER — MIDAZOLAM HCL 2 MG/2ML IJ SOLN
4.0000 mg | Freq: Once | INTRAMUSCULAR | Status: AC
  Administered 2013-12-15: 4 mg via INTRAVENOUS
  Filled 2013-12-15: qty 4

## 2013-12-15 MED ORDER — SODIUM PHOSPHATE 3 MMOLE/ML IV SOLN
10.0000 mmol | Freq: Once | INTRAVENOUS | Status: AC
  Administered 2013-12-15: 10 mmol via INTRAVENOUS
  Filled 2013-12-15: qty 3.33

## 2013-12-15 MED ORDER — FENTANYL CITRATE 0.05 MG/ML IJ SOLN
100.0000 ug | Freq: Once | INTRAMUSCULAR | Status: AC
  Administered 2013-12-15: 100 ug via INTRAVENOUS
  Filled 2013-12-15: qty 2

## 2013-12-15 MED ORDER — FENTANYL CITRATE 0.05 MG/ML IJ SOLN
25.0000 ug/h | INTRAMUSCULAR | Status: DC
  Administered 2013-12-15: 100 ug/h via INTRAVENOUS
  Administered 2013-12-16: 400 ug/h via INTRAVENOUS
  Filled 2013-12-15 (×2): qty 50

## 2013-12-15 MED ORDER — PANTOPRAZOLE SODIUM 40 MG PO PACK
40.0000 mg | PACK | Freq: Every day | ORAL | Status: DC
  Administered 2013-12-15 – 2013-12-16 (×2): 40 mg
  Filled 2013-12-15 (×3): qty 20

## 2013-12-15 MED ORDER — POTASSIUM CHLORIDE 20 MEQ/15ML (10%) PO LIQD
20.0000 meq | ORAL | Status: AC
  Administered 2013-12-15 (×2): 20 meq
  Filled 2013-12-15 (×2): qty 15

## 2013-12-15 MED ORDER — POTASSIUM PHOSPHATE DIBASIC 3 MMOLE/ML IV SOLN
10.0000 mmol | Freq: Once | INTRAVENOUS | Status: DC
  Filled 2013-12-15: qty 3.33

## 2013-12-15 NOTE — Progress Notes (Signed)
  Echocardiogram 2D Echocardiogram has been performed.  Real [name] 12/15/2013, 11:35 AM

## 2013-12-15 NOTE — Progress Notes (Signed)
[facility] ICU Electrolyte Replacement Protocol  Patient Name: Oscar Burke DOB: 10-07-1980 MRN: [id number]  Date of Service  12/15/2013   HPI/Events of Note    Recent Labs Lab 12/13/13 0956 12/14/13 0411 12/14/13 1320 12/14/13 2246 12/15/13 0220  NA 136* 140  --   --  139  K 3.7 4.0  --   --  3.4*  CL 95* 105  --   --  104  CO2 23 22  --   --  23  GLUCOSE 221* 80  --   --  140*  BUN 15 11  --   --  6  CREATININE 1.12 0.85  --   --  0.79  CALCIUM 8.8 8.4  --   --  8.1*  MG  --  1.8 2.4 2.0  --   PHOS  --  1.8* 1.5* 1.4*  --     Estimated Creatinine Clearance: 147 ml/min (by C-G formula based on Cr of 0.79).  Intake/Output     04/09 0701 - 04/10 0700   I.V. (mL/kg) 3324.3 (41.1)   NG/GT 440   IV Piggyback 1363.3   Total Intake(mL/kg) 5127.6 (63.4)   Urine (mL/kg/hr) 2175 (1.1)   Total Output 2175   Net +2952.6        - I/O DETAILED x24h    Total I/O In: 2374.6 [I.V.:1649.6; NG/GT:315; IV Piggyback:410] Out: 1395 [Urine:1395] - I/O THIS SHIFT    ASSESSMENT   eICURN Interventions  ICU Electrolyte Replacement Protocol criteria met.Labs Replaced per protocol.MD notified   ASSESSMENT: MAJOR ELECTROLYTE    Lorene Dy 12/15/2013, 6:14 AM

## 2013-12-15 NOTE — Progress Notes (Signed)
PULMONARY / CRITICAL CARE MEDICINE   Name: Oscar Burke MRN: [id number] DOB: 07-Sep-1981    ADMISSION DATE:  12/13/2013 CONSULTATION DATE:  12/13/2013  REFERRING MD : EDP PRIMARY SERVICE: PCCM  CHIEF COMPLAINT:  Suspected heroine overdose  BRIEF PATIENT DESCRIPTION: 33 yo M with unknown PMH brought to ED after his girlfriend found him unresponsive, suspected heroine overdose. In ED received 2 doses of Narcan without significant improvement and was intubated. PCCM was consulted.  SIGNIFICANT EVENTS / STUDIES:  4/8 Admitted for suspected heroine overdose, responded to narcan. Hypertensive with persistent fever(Tmax 104.6) 4/8 Head CT with no acute abnormality. Acute on chronic sinusitis  4/8 CXR Increased density in the right perihilar region, representing possible subsegmental atelectasis or pneumonia 4/9 CXR Development of bilateral pulmonary alveolar infiltrates, particularly left lower lobe.  4/10 Persistent fevers overnight 4/10 CXR with worsening consolidation and collapse in left lower lobe    LINES / TUBES: ETT 4/8 >>  OGT 4/8 >>  Foley 4/8 >>  CULTURES: MRSA 4/8 NEG UC 4/8 NG BC 4/9 >> RC (tracheal) 4/9 >> few GPC pairs, rare GNR Strep Pneum 4/9 NEG Resp panel 4/10 >>  ANTIBIOTICS: IV Levaquin 4/9 >> IV Vanc 4/9 >>  SUBJECTIVE:  Persistent fevers yesterday and overnight. Still intubated and sedated.   VITAL SIGNS: Temp:  [99.1 F (37.3 C)-101.9 F (38.8 C)] 100.3 F (37.9 C) (04/10 0800) Pulse Rate:  [73-94] 86 (04/10 0800) Resp:  [15-18] 16 (04/10 0800) BP: (95-125)/(32-69) 121/66 mmHg (04/10 0800) SpO2:  [92 %-100 %] 98 % (04/10 0800) FiO2 (%):  [40 %] 40 % (04/10 0800) Weight:  [81.7 kg (180 lb 1.9 oz)] 81.7 kg (180 lb 1.9 oz) (04/10 0500) HEMODYNAMICS:   VENTILATOR SETTINGS: Vent Mode:  [-] PRVC FiO2 (%):  [40 %] 40 % Set Rate:  [16 bmp] 16 bmp Vt Set:  [640 mL] 640 mL PEEP:  [5 cmH20] 5 cmH20 Plateau Pressure:  [15 cmH20-20 cmH20] 15  cmH20 INTAKE / OUTPUT: Intake/Output     04/09 0701 - 04/10 0700 04/10 0701 - 04/11 0700   I.V. (mL/kg) 3482.6 (42.6) 158.3 (1.9)   NG/GT 505 65   IV Piggyback 1405.3 42   Total Intake(mL/kg) 5392.9 (66) 265.3 (3.2)   Urine (mL/kg/hr) 2175 (1.1) 225 (1.3)   Total Output 2175 225   Net +3217.9 +40.3          PHYSICAL EXAMINATION: General: Intubated Neuro: Sedated rass 1, not folllowing commands HEENT: OETT / OGT  Cardiovascular: Normal rate and rhythm Lungs: Diminished breath sounds  Abdomen: Soft, nontender, bowel sounds diminished  Musculoskeletal: SCD's,  no edema  Skin: Intact. Multiple tattoos throughout body.    LABS:  CBC  Recent Labs Lab 12/13/13 0956 12/14/13 0411 12/15/13 0220  WBC 12.0* 21.5* 14.7*  HGB 15.8 14.6 12.3*  HCT 45.5 42.9 36.6*  PLT 218 136* 127*   BMET  Recent Labs Lab 12/13/13 0956 12/14/13 0411 12/15/13 0220  NA 136* 140 139  K 3.7 4.0 3.4*  CL 95* 105 104  CO2 23 22 23   BUN 15 11 6   CREATININE 1.12 0.85 0.79  GLUCOSE 221* 80 140*   Electrolytes  Recent Labs Lab 12/13/13 0956 12/14/13 0411 12/14/13 1320 12/14/13 2246 12/15/13 0220  CALCIUM 8.8 8.4  --   --  8.1*  MG  --  1.8 2.4 2.0  --   PHOS  --  1.8* 1.5* 1.4*  --    Sepsis Markers  Recent Labs Lab 12/13/13 1640  12/13/13 2240 12/14/13 0411 12/14/13 1320 12/15/13 0220  LATICACIDVEN 1.6 1.5 1.8  --   --   PROCALCITON  --   --   --  3.72 2.53   ABG  Recent Labs Lab 12/13/13 1055 12/14/13 0620  PHART 7.327* 7.426  PCO2ART 54.6* 40.5  PO2ART 271.0* 64.6*   Liver Enzymes  Recent Labs Lab 12/13/13 0956 12/15/13 0220  AST 51* 20  ALT 29 14  ALKPHOS 74 63  BILITOT 0.3 0.5  ALBUMIN 3.5 2.4*   Cardiac Enzymes  Recent Labs Lab 12/13/13 0956 12/13/13 1600 12/13/13 2222  TROPONINI <0.30 <0.30 <0.30   Glucose  Recent Labs Lab 12/14/13 0837 12/14/13 1144 12/14/13 1545 12/14/13 1917 12/15/13 0030 12/15/13 0352  GLUCAP 79 93 125* 94 95  139*    Imaging Ct Head Wo Contrast  12/13/2013   CLINICAL DATA:  Overdose.  EXAM: CT HEAD WITHOUT CONTRAST  TECHNIQUE: Contiguous axial images were obtained from the base of the skull through the vertex without intravenous contrast.  COMPARISON:  None.  FINDINGS: No acute cortical infarct, hemorrhage, or mass lesion ispresent. Ventricles are of normal size. No significant extra-axial fluid collection is present. There is an air-fluid level identified within the left maxillary sinus. Mucosal thickening involving the ethmoid air cells and sphenoid sinus also noted. The mastoid air cells appear clear. The skull is intact. The osseous skull is intact.  IMPRESSION: 1. No acute intracranial abnormalities. 2. Acute on chronic sinus inflammation.   Electronically Signed   By: Signa Kell M.D.   On: 12/13/2013 18:33   Dg Chest Port 1 View  12/15/2013   CLINICAL DATA:  Endotracheal tube assessment.  EXAM: PORTABLE CHEST - 1 VIEW  COMPARISON:  12/14/2013  FINDINGS: Endotracheal tube has its tip 7 cm above the carina. Nasogastric tube enters the abdomen. There is worsening consolidation volume loss in the left lower lobe. Mild patchy density persists in the right lower lobe.  IMPRESSION: Endotracheal tube 7 cm above the carina.  Worsening consolidation and collapse in the left lower lobe.   Electronically Signed   By: Paulina Fusi M.D.   On: 12/15/2013 07:08   Dg Chest Port 1 View  12/14/2013   CLINICAL DATA:  Intubation.  EXAM: PORTABLE CHEST - 1 VIEW  COMPARISON:  DG CHEST 1V PORT dated 12/13/2013  FINDINGS: Endotracheal tube and NG tube in stable position. Mediastinum hilar structures are unremarkable. Heart size and pulmonary vascularity normal. Interval development of bilateral pulmonary infiltrates particularly in the left lower lobe. Pneumonia could present in this fashion. Acute pulmonary edema could present in this fashion. No pleural effusion or pneumothorax. No acute osseous abnormality.  IMPRESSION: 1.  Endotracheal tube and NG tube in stable position. 2. Interval development of bilateral pulmonary alveolar infiltrates particularly left lower lobe. 3. Heart size and pulmonary vascularity normal.   Electronically Signed   By: Maisie Fus  Register   On: 12/14/2013 07:37   Dg Chest Portable 1 View  12/13/2013   CLINICAL DATA:  Suspected drug overdose  EXAM: PORTABLE CHEST - 1 VIEW  COMPARISON:  None.  FINDINGS: The lungs are adequately inflated. There is hazy increased density in the right mid lung which may reflect subsegmental atelectasis or other alveolar filling processes including pneumonia. There is no shift of the midline. The endotracheal tube tip lies 5.3 cm above the crotch of the carina. The cardiac silhouette is normal in size. The pulmonary vascularity is not engorged. The observed portions of the bony thorax appear  normal.  IMPRESSION: 1. Increased density in the right perihilar region may reflect subsegmental atelectasis or pneumonia. 2. The endotracheal tube tip lies 5.3 cm above the crotch of the carina.   Electronically Signed   By: David  [place]   On: 12/13/2013 11:05   Dg Abd Portable 1v  12/13/2013   CLINICAL DATA:  Orogastric tube placement  EXAM: PORTABLE ABDOMEN - 1 VIEW  COMPARISON:  None.  FINDINGS: Orogastric tube tip is in the proximal stomach. The side port is immediately inferior to the gastroesophageal junction. The bowel gas pattern is unremarkable. No obstruction or free air is seen on this supine examination there is moderate stool in the colon.  IMPRESSION: Orogastric tube tip in proximal stomach. The side port is immediately distal to the gastroesophageal junction. It may be prudent to consider advancing the tube 6 to 8 cm to confirm that the side port remains in the stomach. Bowel gas pattern unremarkable.   Electronically Signed   By: Bretta Bang M.D.   On: 12/13/2013 16:46     ASSESSMENT / PLAN:  PULMONARY  A:  Acute respiratory failure requiring ventilator  support Left Lower Lobe PNA Acute on chronic sinusitis  P:  Continue mechanical support with daily SBT as tolerated, cpap5 ps 5 goal 2 hrs Continue vanc and levaquin Follow pCXR for LLL No role bronch  CARDIOVASCULAR  A:  Sinus Tachycardia - resolved Hypotension - resolved P:  Monitor on telemetry  Control fevers  RENAL  A:  Hypokalemia  Hypoalbuminemia  Lactic acidosis - resolved P:  Currently on NS 125 cc/hr Avoid free water till we r/o anoxia Replace electrolytes K  Monitor volume status   GASTROINTESTINAL  A:  Nutrition  GI Ppx  Hepatitis C infection P:  IVF's and TF's IV protonix daily F/U hepatitis panel Limit tylenol 2 grams/day   HEMATOLOGIC  A:  Thrombocytopenia  Normocytic Anemia    - likely dilutional DVT Ppx  P:  Maintain SCD's  Lovenox   INFECTIOUS  A:  Leukocytosis with persistent fevers    - CXR with worsening consolidation and collapse, baseline PCT 3.72  Acute on chronic sinusitis  History of IVDU   - consider endocarditis  Allergy pcn not well described P:  Follow pCXR for LL in am  Monitor CBC and PCT Continue levaquin and vanc Awaiting 2D-Echo to assess for veg F/U HIV Ab pending  ENDOCRINE  A:  Hyperglycemia - resolved P:  Moderate SSI  CBG Q4hr monitoring   NEUROLOGIC  A:  Acute encephalopathy  Depression Substance abuse    -UDS  pos for opiates  Suspected Heroin overdose ? UPDATE: follows commands, no MRI needed P:  Daily WUA  Cont Propofol gtt  Cont Fentanyl PRN   Marjan Rabbani PGY-1 IMTS  12/15/2013, 9:05 AM   Ccm time 30 min   I have fully examined this patient and agree with above findings.    Edited infull  Mcarthur Rossetti. Tyson Alias, MD, FACP Pgr: [phone] [facility]

## 2013-12-16 ENCOUNTER — Inpatient Hospital Stay (HOSPITAL_COMMUNITY): Payer: Self-pay

## 2013-12-16 LAB — BASIC METABOLIC PANEL
BUN: 7 mg/dL (ref 6–23)
CHLORIDE: 109 meq/L (ref 96–112)
CO2: 24 meq/L (ref 19–32)
Calcium: 9 mg/dL (ref 8.4–10.5)
Creatinine, Ser: 0.76 mg/dL (ref 0.50–1.35)
GFR calc non Af Amer: >90 mL/min (ref >90)
Glucose, Bld: 88 mg/dL (ref 70–99)
Potassium: 4.1 mEq/L (ref 3.7–5.3)
SODIUM: 147 meq/L (ref 137–147)

## 2013-12-16 LAB — CBC
HEMATOCRIT: 40.8 % (ref 39.0–52.0)
Hemoglobin: 13.9 g/dL (ref 13.0–17.0)
MCH: 30.9 pg (ref 26.0–34.0)
MCHC: 34.1 g/dL (ref 30.0–36.0)
MCV: 90.7 fL (ref 78.0–100.0)
PLATELETS: 170 10*3/uL (ref 150–400)
RBC: 4.5 MIL/uL (ref 4.22–5.81)
RDW: 12.5 % (ref 11.5–15.5)
WBC: 12.2 10*3/uL — AB (ref 4.0–10.5)

## 2013-12-16 LAB — PHOSPHORUS: PHOSPHORUS: 1.8 mg/dL — AB (ref 2.3–4.6)

## 2013-12-16 LAB — GLUCOSE, CAPILLARY
GLUCOSE-CAPILLARY: 71 mg/dL (ref 70–99)
GLUCOSE-CAPILLARY: 89 mg/dL (ref 70–99)
Glucose-Capillary: 115 mg/dL — ABNORMAL HIGH (ref 70–99)
Glucose-Capillary: 117 mg/dL — ABNORMAL HIGH (ref 70–99)
Glucose-Capillary: 82 mg/dL (ref 70–99)
Glucose-Capillary: 98 mg/dL (ref 70–99)

## 2013-12-16 LAB — MAGNESIUM: Magnesium: 1.8 mg/dL (ref 1.5–2.5)

## 2013-12-16 LAB — PROCALCITONIN: PROCALCITONIN: 0.97 ng/mL

## 2013-12-16 MED ORDER — ONDANSETRON HCL 4 MG/2ML IJ SOLN
INTRAMUSCULAR | Status: AC
  Filled 2013-12-16: qty 2

## 2013-12-16 MED ORDER — VANCOMYCIN HCL IN DEXTROSE 1-5 GM/200ML-% IV SOLN
1000.0000 mg | Freq: Three times a day (TID) | INTRAVENOUS | Status: DC
  Administered 2013-12-16 – 2013-12-17 (×3): 1000 mg via INTRAVENOUS
  Filled 2013-12-16 (×5): qty 200

## 2013-12-16 MED ORDER — KETOROLAC TROMETHAMINE 30 MG/ML IJ SOLN
30.0000 mg | Freq: Once | INTRAMUSCULAR | Status: AC
  Administered 2013-12-16: 30 mg via INTRAVENOUS
  Filled 2013-12-16: qty 1

## 2013-12-16 MED ORDER — SODIUM PHOSPHATE 3 MMOLE/ML IV SOLN
10.0000 mmol | Freq: Once | INTRAVENOUS | Status: AC
  Administered 2013-12-16: 10 mmol via INTRAVENOUS
  Filled 2013-12-16: qty 3.33

## 2013-12-16 MED ORDER — MIDAZOLAM HCL 2 MG/2ML IJ SOLN: 2.0000 mg | INTRAMUSCULAR | Status: DC | PRN

## 2013-12-16 MED ORDER — ONDANSETRON HCL 4 MG/2ML IJ SOLN
4.0000 mg | Freq: Three times a day (TID) | INTRAMUSCULAR | Status: DC | PRN
  Administered 2013-12-16 (×2): 4 mg via INTRAVENOUS
  Filled 2013-12-16: qty 2

## 2013-12-16 NOTE — Procedures (Signed)
Extubation Procedure Note  Patient Details:   Name: [name] DOB: May 08, 1981 MRN: [id number]   Airway Documentation:     Evaluation  O2 sats: stable throughout procedure. Complications: No apparent complications Patient did tolerate procedure well. Bilateral Breath Sounds: Clear;Coarse crackles Suctioning: Airway Yes Pt. lifting head of bed spontaneously, NIF(-20)cmh20, FVC(1L), (+) cuff leak, no Stridor noted s/p, plan to start I/S asap, placed on 2lpm n/c s/p, >'d to 4lpm per RN for sats > 92%, RT to monitor. Gypsy Decantatrick M Aeralyn Barna 12/16/2013, 9:42 AM

## 2013-12-16 NOTE — Progress Notes (Signed)
PULMONARY / CRITICAL CARE MEDICINE   Name: Oscar Burke MRN: [id number] DOB: 08/17/1981    ADMISSION DATE:  12/13/2013 CONSULTATION DATE:  12/13/2013  REFERRING MD : EDP PRIMARY SERVICE: PCCM  CHIEF COMPLAINT:  Suspected heroine overdose  BRIEF PATIENT DESCRIPTION: 33 yo M with unknown PMH brought to ED after his girlfriend found him unresponsive, suspected heroine overdose. In ED received 2 doses of Narcan without significant improvement and was intubated. PCCM was consulted.  SIGNIFICANT EVENTS / STUDIES:  4/8 Admitted for suspected heroine overdose, responded to narcan. Hypertensive with persistent fever(Tmax 104.6) 4/8 Head CT with no acute abnormality. Acute on chronic sinusitis  4/8 CXR Increased density in the right perihilar region, representing possible subsegmental atelectasis or pneumonia 4/9 CXR Development of bilateral pulmonary alveolar infiltrates, particularly left lower lobe.  4/10 Persistent fevers overnight 4/10 CXR with worsening consolidation and collapse in left lower lobe  4/10 echo - nml LV fn, no veg  LINES / TUBES: ETT 4/8 >>  OGT 4/8 >>  Foley 4/8 >>  CULTURES: MRSA 4/8 NEG UC 4/8 NG BC 4/9 >>ng RC (tracheal) 4/9 >> few GPC pairs, rare GNR >> Strep Pneum 4/9 NEG Resp panel 4/10 >>  ANTIBIOTICS: IV Levaquin 4/9 >> IV Vanc 4/9 >> 4/11  SUBJECTIVE: defervesced Still intubated and agitated on WUA  VITAL SIGNS: Temp:  [98.5 F (36.9 C)-99.8 F (37.7 C)] 99 F (37.2 C) (04/11 0414) Pulse Rate:  [54-82] 58 (04/11 0700) Resp:  [16-27] 16 (04/11 0700) BP: (99-138)/(52-75) 99/53 mmHg (04/11 0700) SpO2:  [94 %-100 %] 97 % (04/11 0700) FiO2 (%):  [40 %] 40 % (04/11 0400) Weight:  [82.3 kg (181 lb 7 oz)] 82.3 kg (181 lb 7 oz) (04/11 0500) HEMODYNAMICS:   VENTILATOR SETTINGS: Vent Mode:  [-] PRVC FiO2 (%):  [40 %] 40 % Set Rate:  [16 bmp] 16 bmp Vt Set:  [640 mL] 640 mL PEEP:  [5 cmH20] 5 cmH20 Pressure Support:  [5 cmH20] 5 cmH20 Plateau  Pressure:  [15 cmH20-27 cmH20] 17 cmH20 INTAKE / OUTPUT: Intake/Output     04/10 0701 - 04/11 0700 04/11 0701 - 04/12 0700   I.V. (mL/kg) 3659.1 (44.5)    NG/GT 1520    IV Piggyback 284    Total Intake(mL/kg) 5463.1 (66.4)    Urine (mL/kg/hr) 1790 (0.9)    Total Output 1790     Net +3673.1            PHYSICAL EXAMINATION: General: Intubated Neuro:agitated on WUA, grossly non focal, follows commands HEENT: OETT / OGT  Cardiovascular: Normal rate and rhythm Lungs: Diminished breath sounds  Abdomen: Soft, nontender, bowel sounds diminished  Musculoskeletal: SCD's,  no edema  Skin: Intact. Multiple tattoos throughout body.    LABS:  CBC  Recent Labs Lab 12/13/13 0956 12/14/13 0411 12/15/13 0220  WBC 12.0* 21.5* 14.7*  HGB 15.8 14.6 12.3*  HCT 45.5 42.9 36.6*  PLT 218 136* 127*   BMET  Recent Labs Lab 12/13/13 0956 12/14/13 0411 12/15/13 0220  NA 136* 140 139  K 3.7 4.0 3.4*  CL 95* 105 104  CO2 23 22 23   BUN 15 11 6   CREATININE 1.12 0.85 0.79  GLUCOSE 221* 80 140*   Electrolytes  Recent Labs Lab 12/13/13 0956 12/14/13 0411  12/14/13 2246 12/15/13 0220 12/15/13 1208 12/15/13 2338  CALCIUM 8.8 8.4  --   --  8.1*  --   --   MG  --  1.8  < > 2.0  --  2.0 1.8  PHOS  --  1.8*  < > 1.4*  --  1.4* 1.8*  < > = values in this interval not displayed. Sepsis Markers  Recent Labs Lab 12/13/13 1640 12/13/13 2240 12/14/13 0411 12/14/13 1320 12/15/13 0220  LATICACIDVEN 1.6 1.5 1.8  --   --   PROCALCITON  --   --   --  3.72 2.53   ABG  Recent Labs Lab 12/13/13 1055 12/14/13 0620  PHART 7.327* 7.426  PCO2ART 54.6* 40.5  PO2ART 271.0* 64.6*   Liver Enzymes  Recent Labs Lab 12/13/13 0956 12/15/13 0220  AST 51* 20  ALT 29 14  ALKPHOS 74 63  BILITOT 0.3 0.5  ALBUMIN 3.5 2.4*   Cardiac Enzymes  Recent Labs Lab 12/13/13 0956 12/13/13 1600 12/13/13 2222  TROPONINI <0.30 <0.30 <0.30   Glucose  Recent Labs Lab 12/15/13 1152  12/15/13 1556 12/15/13 1953 12/15/13 2359 12/16/13 0411 12/16/13 0725  GLUCAP 99 102* 95 117* 98 115*    Imaging Dg Chest Port 1 View  12/15/2013   CLINICAL DATA:  Endotracheal tube assessment.  EXAM: PORTABLE CHEST - 1 VIEW  COMPARISON:  12/14/2013  FINDINGS: Endotracheal tube has its tip 7 cm above the carina. Nasogastric tube enters the abdomen. There is worsening consolidation volume loss in the left lower lobe. Mild patchy density persists in the right lower lobe.  IMPRESSION: Endotracheal tube 7 cm above the carina.  Worsening consolidation and collapse in the left lower lobe.   Electronically Signed   By: Paulina FusiMark  Shogry M.D.   On: 12/15/2013 07:08     ASSESSMENT / PLAN:  PULMONARY  A:  Acute respiratory failure requiring ventilator support Left Lower Lobe PNA Acute on chronic sinusitis  P:  SBTS , goal extubate Continue vanc and levaquin Follow pCXR for LLL   CARDIOVASCULAR  A:  Sinus Tachycardia - resolved Hypotension - resolved P:  Monitor on telemetry  Control fevers  RENAL  A:  Hypokalemia  Hypoalbuminemia  Lactic acidosis - resolved P:  IVFs Replaced electrolytes K -phos Monitor volume status   GASTROINTESTINAL  A:  Nutrition  GI Ppx  Hepatitis C infection P:   Dc  protonix Limit tylenol 2 grams/day   HEMATOLOGIC  A:  Thrombocytopenia  Normocytic Anemia    - likely dilutional DVT Ppx  P:  Maintain SCD's  Lovenox   INFECTIOUS  A:  Leukocytosis with persistent fevers    - LLL aspn pna  baseline PCT 3.72  Acute on chronic sinusitis  History of IVDU   - consider endocarditis  Allergy pcn not well described P:  Monitor CBC and PCT Continue levaquin , dc vanc in 24h if no MRSA isolated   ENDOCRINE  A:  Hyperglycemia - resolved P:  Moderate SSI  CBG Q4hr monitoring   NEUROLOGIC  A:  Acute encephalopathy  Depression Substance abuse    -UDS  pos for opiates  Suspected Heroin overdose ?  P:  Dc sedation after  extubation  Care during the described time interval was provided by me and/or other providers on the critical care team.  I have reviewed this patient's available data, including medical history, events of note, physical examination and test results as part of my evaluation  CC time x 4129m  Rakesh V Jodene NamAlva  Rakesh Alva MD. FCCP. [facility] Pager [phone] If no response call [phone]    12/16/2013, 8:41 AM

## 2013-12-16 NOTE — Progress Notes (Signed)
eLink Physician-Brief Progress Note Patient Name: Oscar Burke DOB: 1981-03-12 MRN: [id number]  Date of Service  12/16/2013   HPI/Events of Note   SOB following extubation today. Per RT no stridor or wheezing.   eICU Interventions   Observe    Intervention Category Intermediate Interventions: Respiratory distress - evaluation and management  Jenelle Magesdam R. Heaton Sarin 12/16/2013, 8:45 PM

## 2013-12-16 NOTE — Progress Notes (Signed)
Wasted 100cc fentanyl from gtt, and 50cc propofol from gtt, after med d/c'd for extubation. Witnessed by Rosey Batheresa, Charity fundraiserN. Wasted in sink.

## 2013-12-16 NOTE — Progress Notes (Signed)
eLink Physician-Brief Progress Note Patient Name: Oscar Burke DOB: Jun 15, 1981 MRN: [id number]  Date of Service  12/16/2013   HPI/Events of Note   RN concern for withdrawal. Headache. No focal deficits per RN. CT head without evidence of hemorrhage on admission. Getting PRN Fentanyl so withdrawal possibility but doubt major component.   eICU Interventions   Trial of Toradol.     Intervention Category Intermediate Interventions: Other:  Jenelle MagesAdam R. Evelean Bigler 12/16/2013, 4:00 PM

## 2013-12-16 NOTE — Progress Notes (Signed)
eLink Physician-Brief Progress Note Patient Name: Oscar Burke DOB: 12/24/1980 MRN: [id number]  Date of Service  12/16/2013   HPI/Events of Note    Tracheal aspirate cultures reviewed. GP cocci and GNR. WBC downtrending. On vanc/Levaquin.   eICU Interventions   Maintain current abx pending further speciation/sensititivies.    Intervention Category Intermediate Interventions: Infection - evaluation and management  Jenelle MagesAdam R. Creighton Longley 12/16/2013, 4:45 PM

## 2013-12-17 ENCOUNTER — Encounter (HOSPITAL_COMMUNITY): Payer: Self-pay | Admitting: *Deleted

## 2013-12-17 ENCOUNTER — Other Ambulatory Visit: Payer: Self-pay

## 2013-12-17 LAB — CULTURE, RESPIRATORY W GRAM STAIN

## 2013-12-17 LAB — URINALYSIS, ROUTINE W REFLEX MICROSCOPIC
BILIRUBIN URINE: NEGATIVE
Glucose, UA: NEGATIVE mg/dL
KETONES UR: 15 mg/dL — AB
Leukocytes, UA: NEGATIVE
NITRITE: NEGATIVE
Protein, ur: NEGATIVE mg/dL
Specific Gravity, Urine: 1.015 (ref 1.005–1.030)
UROBILINOGEN UA: 2 mg/dL — AB (ref 0.0–1.0)
pH: 7.5 (ref 5.0–8.0)

## 2013-12-17 LAB — CULTURE, RESPIRATORY: SPECIAL REQUESTS: NORMAL

## 2013-12-17 LAB — URINE MICROSCOPIC-ADD ON

## 2013-12-17 LAB — VANCOMYCIN, TROUGH: Vancomycin Tr: 11 ug/mL (ref 10.0–20.0)

## 2013-12-17 LAB — GLUCOSE, CAPILLARY
GLUCOSE-CAPILLARY: 77 mg/dL (ref 70–99)
GLUCOSE-CAPILLARY: 78 mg/dL (ref 70–99)
GLUCOSE-CAPILLARY: 81 mg/dL (ref 70–99)

## 2013-12-17 MED ORDER — MORPHINE SULFATE 2 MG/ML IJ SOLN
INTRAMUSCULAR | Status: AC
  Filled 2013-12-17: qty 1

## 2013-12-17 MED ORDER — LEVOFLOXACIN 750 MG PO TABS
750.0000 mg | ORAL_TABLET | Freq: Every day | ORAL | Status: DC
  Administered 2013-12-18: 750 mg via ORAL
  Filled 2013-12-17: qty 1

## 2013-12-17 MED ORDER — MORPHINE SULFATE 2 MG/ML IJ SOLN
2.0000 mg | INTRAMUSCULAR | Status: DC | PRN
  Administered 2013-12-17 (×2): 2 mg via INTRAVENOUS
  Filled 2013-12-17: qty 1

## 2013-12-17 MED ORDER — ACETAMINOPHEN 325 MG PO TABS: 650.0000 mg | ORAL_TABLET | Freq: Three times a day (TID) | ORAL | Status: DC | PRN

## 2013-12-17 NOTE — Progress Notes (Addendum)
PULMONARY / CRITICAL CARE MEDICINE   Name: Oscar Burke MRN: [id number] DOB: 1981-01-29    ADMISSION DATE:  12/13/2013 CONSULTATION DATE:  12/13/2013  REFERRING MD : EDP PRIMARY SERVICE: PCCM  CHIEF COMPLAINT:  Suspected heroine overdose  BRIEF PATIENT DESCRIPTION: 33 yo M with unknown PMH brought to ED after his girlfriend found him unresponsive, suspected heroine overdose. In ED received 2 doses of Narcan without significant improvement and was intubated. PCCM was consulted.  SIGNIFICANT EVENTS / STUDIES:  4/8 Admitted for suspected heroine overdose, responded to narcan. Hypertensive with persistent fever(Tmax 104.6) 4/8 Head CT with no acute abnormality. Acute on chronic sinusitis  4/8 CXR Increased density in the right perihilar region, representing possible subsegmental atelectasis or pneumonia 4/9 CXR Development of bilateral pulmonary alveolar infiltrates, particularly left lower lobe.  4/10 Persistent fevers overnight 4/10 CXR with worsening consolidation and collapse in left lower lobe  4/10 echo - nml LV fn, no veg  LINES / TUBES: ETT 4/8 >> 04/11 OGT 4/8 >> 04/11 Foley 4/8 >> 04/11  CULTURES: MRSA 4/8 NEG UC 4/8 NG BC 4/9 >>ng RC (tracheal) 4/9 >> MSSA ,pneumococcus Strep Pneum 4/9 NEG Resp panel 4/10 >> Urine culture 04/12  ANTIBIOTICS: IV Levaquin 4/9 >> IV Vanc 4/9 >> 4/12  SUBJECTIVE: Throat sore. chest pain overnight, reproducible and brought on by cough.  No CP at present.  + dysuria.  No BM but passing gas.  No N/V and feels hungry.  Afebrile overnight.  VITAL SIGNS: Temp:  [98.4 F (36.9 C)-99.9 F (37.7 C)] 99.4 F (37.4 C) (04/12 0400) Pulse Rate:  [63-147] 78 (04/12 0600) Resp:  [16-36] 24 (04/12 0600) BP: (100-154)/(54-90) 140/89 mmHg (04/12 0600) SpO2:  [88 %-100 %] 95 % (04/12 0600) FiO2 (%):  [40 %] 40 % (04/11 0800) Weight:  [82.1 kg (181 lb)] 82.1 kg (181 lb) (04/12 0500)  HEMODYNAMICS:   VENTILATOR SETTINGS: Vent Mode:  [-]   FiO2 (%):  [40 %] 40 % INTAKE / OUTPUT: Intake/Output     04/11 0701 - 04/12 0700 04/12 0701 - 04/13 0700   P.O. 10    I.V. (mL/kg) 900 (11)    NG/GT 0    IV Piggyback 750    Total Intake(mL/kg) 1660 (20.2)    Urine (mL/kg/hr) 4725 (2.4)    Emesis/NG output 50 (0)    Total Output 4775     Net -3115          Stool Occurrence 1 x      PHYSICAL EXAMINATION: General:oob to chair in NAD Neuro: AAO x 3, responding appropriately, able to move extremities voluntarily HEENT: no abnormalities Cardiovascular: Normal rate and rhythm Lungs: Diminished breath sounds  Abdomen: Soft, +BS, diffusely mild TTP Musculoskeletal: SCD's, no edema  Skin: Intact. Multiple tattoos throughout body.    LABS:  CBC  Recent Labs Lab 12/14/13 0411 12/15/13 0220 12/16/13 2037  WBC 21.5* 14.7* 12.2*  HGB 14.6 12.3* 13.9  HCT 42.9 36.6* 40.8  PLT 136* 127* 170   BMET  Recent Labs Lab 12/14/13 0411 12/15/13 0220 12/16/13 2037  NA 140 139 147  K 4.0 3.4* 4.1  CL 105 104 109  CO2 22 23 24   BUN 11 6 7   CREATININE 0.85 0.79 0.76  GLUCOSE 80 140* 88   Electrolytes  Recent Labs Lab 12/14/13 0411  12/14/13 2246 12/15/13 0220 12/15/13 1208 12/15/13 2338 12/16/13 2037  CALCIUM 8.4  --   --  8.1*  --   --  9.0  MG 1.8  < > 2.0  --  2.0 1.8  --   PHOS 1.8*  < > 1.4*  --  1.4* 1.8*  --   < > = values in this interval not displayed. Sepsis Markers  Recent Labs Lab 12/13/13 1640 12/13/13 2240 12/14/13 0411 12/14/13 1320 12/15/13 0220 12/16/13 2037  LATICACIDVEN 1.6 1.5 1.8  --   --   --   PROCALCITON  --   --   --  3.72 2.53 0.97   ABG  Recent Labs Lab 12/13/13 1055 12/14/13 0620  PHART 7.327* 7.426  PCO2ART 54.6* 40.5  PO2ART 271.0* 64.6*   Liver Enzymes  Recent Labs Lab 12/13/13 0956 12/15/13 0220  AST 51* 20  ALT 29 14  ALKPHOS 74 63  BILITOT 0.3 0.5  ALBUMIN 3.5 2.4*   Cardiac Enzymes  Recent Labs Lab 12/13/13 0956 12/13/13 1600 12/13/13 2222   TROPONINI <0.30 <0.30 <0.30   Glucose  Recent Labs Lab 12/16/13 0725 12/16/13 1125 12/16/13 1526 12/16/13 1934 12/17/13 0011 12/17/13 0412  GLUCAP 115* 71 89 82 78 81    Imaging Dg Chest Port 1 View  12/16/2013   CLINICAL DATA:  Followup pneumonia.  EXAM: PORTABLE CHEST - 1 VIEW  COMPARISON:  12/15/13  FINDINGS: Left lung base opacity is without change consistent with left lower lobe pneumonia.  No new lung opacities.  No pleural effusion.  No pneumothorax.  Endotracheal tube is stable with its tip projecting 5 cm above the carina. Nasogastric tube passes below the diaphragm stomach.  IMPRESSION: 1. Left lower lobe pneumonia, without significant change from the previous day's study. No new lung abnormalities. 2. Support apparatus is well positioned.   Electronically Signed   By: Amie Portlandavid  Ormond M.D.   On: 12/16/2013 09:20     ASSESSMENT / PLAN:  PULMONARY  A:  Acute respiratory failure requiring ventilator support - extubated 12/16/13 Left Lower Lobe PNA Tracheal aspirate + staph aureus and strep pneumo - both sensitive to Levaquin Acute on chronic sinusitis  P:   D/c vanc; continue Levaquin --> change to po tomorrow; would continue Levaquin for total of 10 days Follow pCXR for LLL - AM CXR ordered   CARDIOVASCULAR  A:  Sinus Tachycardia - resolved Hypotension - resolved P:    RENAL  A:  Hypokalemia - resolved Hypoalbuminemia  Lactic acidosis - resolved P:  Replace electrolytes prn D/c fluids - pt to resume po  GASTROINTESTINAL  A:  Nutrition - resume po today Hepatitis C infection P:  Limit Tylenol to 2 grams/day    HEMATOLOGIC  A:  Thrombocytopenia - resolved Normocytic Anemia - resolved   - likely dilutional DVT Ppx  P:  Maintain SCD's    INFECTIOUS  A:  LLL PNA - WBC declining and afebrile   - LLL aspn pna  baseline PCT 3.72  Acute on chronic sinusitis  History of IVDU   - consider endocarditis  Allergy pcn not well described Dysuria -  ?UTI  P:   Continue levaquin  Sent UA and culture, patient on Levaquin   ENDOCRINE  A:  Hyperglycemia - resolved P:     NEUROLOGIC  A:  Acute encephalopathy - resolved Depression Substance abuse    -UDS  pos for opiates  Suspected Heroin overdose P:   - monitor  Evelena PeatAlex Wilson, DO IMTS, PGY1  Care during the described time interval was provided by me and/or other providers on the critical care team.  I have reviewed this patient's available  data, including medical history, events of note, physical examination and test results as part of my evaluation  Can transfer to floor & hopefully discharge in 24h   Cyril Mourning MD. FCCP. [facility] Pager [phone] If no response call [phone]    12/17/2013, 7:21 AM

## 2013-12-18 ENCOUNTER — Inpatient Hospital Stay (HOSPITAL_COMMUNITY): Payer: Self-pay

## 2013-12-18 LAB — COMPREHENSIVE METABOLIC PANEL
ALT: 39 U/L (ref 0–53)
AST: 50 U/L — AB (ref 0–37)
Albumin: 3 g/dL — ABNORMAL LOW (ref 3.5–5.2)
Alkaline Phosphatase: 102 U/L (ref 39–117)
BUN: 13 mg/dL (ref 6–23)
CALCIUM: 9.4 mg/dL (ref 8.4–10.5)
CHLORIDE: 101 meq/L (ref 96–112)
CO2: 25 mEq/L (ref 19–32)
CREATININE: 0.77 mg/dL (ref 0.50–1.35)
GFR calc Af Amer: >90 mL/min (ref >90)
GFR calc non Af Amer: >90 mL/min (ref >90)
Glucose, Bld: 99 mg/dL (ref 70–99)
Potassium: 3.9 mEq/L (ref 3.7–5.3)
Sodium: 141 mEq/L (ref 137–147)
TOTAL PROTEIN: 6.9 g/dL (ref 6.0–8.3)
Total Bilirubin: 0.5 mg/dL (ref 0.3–1.2)

## 2013-12-18 LAB — CBC WITH DIFFERENTIAL/PLATELET
BASOS PCT: 1 % (ref 0–1)
Basophils Absolute: 0.1 10*3/uL (ref 0.0–0.1)
EOS ABS: 0.3 10*3/uL (ref 0.0–0.7)
EOS PCT: 3 % (ref 0–5)
HEMATOCRIT: 43.8 % (ref 39.0–52.0)
Hemoglobin: 15 g/dL (ref 13.0–17.0)
Lymphocytes Relative: 22 % (ref 12–46)
Lymphs Abs: 2.1 10*3/uL (ref 0.7–4.0)
MCH: 30.1 pg (ref 26.0–34.0)
MCHC: 34.2 g/dL (ref 30.0–36.0)
MCV: 87.8 fL (ref 78.0–100.0)
MONOS PCT: 10 % (ref 3–12)
Monocytes Absolute: 1 10*3/uL (ref 0.1–1.0)
NEUTROS ABS: 6.1 10*3/uL (ref 1.7–7.7)
Neutrophils Relative %: 64 % (ref 43–77)
Platelets: 207 10*3/uL (ref 150–400)
RBC: 4.99 MIL/uL (ref 4.22–5.81)
RDW: 12.2 % (ref 11.5–15.5)
WBC: 9.5 10*3/uL (ref 4.0–10.5)

## 2013-12-18 LAB — URINE CULTURE
Colony Count: NO GROWTH
Culture: NO GROWTH

## 2013-12-18 LAB — PHOSPHORUS: PHOSPHORUS: 3.2 mg/dL (ref 2.3–4.6)

## 2013-12-18 LAB — HCV RNA QUANT
HCV QUANT LOG: 6.12 {Log} — AB (ref <1.18)
HCV Quantitative: 1319023 IU/mL — ABNORMAL HIGH (ref <15)

## 2013-12-18 LAB — MAGNESIUM: MAGNESIUM: 2 mg/dL (ref 1.5–2.5)

## 2013-12-18 MED ORDER — LEVOFLOXACIN 750 MG PO TABS: 750.0000 mg | ORAL_TABLET | Freq: Every day | ORAL | Status: DC

## 2013-12-18 MED ORDER — ALPRAZOLAM 0.25 MG PO TABS
0.2500 mg | ORAL_TABLET | Freq: Two times a day (BID) | ORAL | Status: DC | PRN
  Administered 2013-12-18: 0.25 mg via ORAL
  Filled 2013-12-18: qty 1

## 2013-12-18 NOTE — Progress Notes (Signed)
I called the patient and informed him of his quantitative HCV RNA lab result. He is now aware his viral load is very high and [facility] for further care and management.

## 2013-12-18 NOTE — Care Management Note (Signed)
    Page 1 of 1   12/18/2013     2:56:03 PM   CARE MANAGEMENT NOTE 12/18/2013  Patient:  Oscar Burke,Oscar Burke   Account Number:  [id number]  Date Initiated:  12/13/2013  Documentation initiated by:  Oscar Burke,Oscar Burke  Subjective/Objective Assessment:   OD on ?? heroin     Action/Plan:   Anticipated DC Date:  12/18/2013   Anticipated DC Plan:  [facility]  In-house referral  Clinical Social Worker      DC Planning Services  CM consult  MATCH Program      Choice offered to / List presented to:             Status of service:  Completed, signed off Medicare Important Message given?   (If response is "NO", the following Medicare IM given date fields will be blank) Date Medicare IM given:   Date Additional Medicare IM given:    Discharge Disposition:  HOME/SELF CARE  Per UR Regulation:  Reviewed for med. necessity/level of care/duration of stay  If discussed at Long Length of Stay Meetings, dates discussed:    Comments:  12/18/13 1453 Oscar Capeeborah Torence Palmeri RN, BSN [phone] patient for dc today, on levaquin 750 5 tabs which will cost him 46.33, patient states he does not have this and will need ast, NCM explained Match Program and patient decided he wanted to use Match.  Match Letter given to patient. Also Psych CSW gave patient outpt resources for detox.  12-13-13 1:30pm Oscar ArenasSarah Brown, RNBSN [phone] No contact noted - SW consult placed.

## 2013-12-18 NOTE — Discharge Summary (Signed)
Physician Discharge Summary  Patient ID: Oscar Burke MRN: [id number] DOB/AGE: 35-30-1982 35 y.o.  Admit date: 12/13/2013 Discharge date: 12/18/2013    Discharge Diagnoses:  Acute Respiratory Failure LLL PNA Acute on Chronic Sinusitis Hx of IV Drug Use Sinus Tachycardia Hypotension  Hypokalemia Hypoalbuminemia Lactic Acidosis Nutrition Hepatitis C Thrombocytopenia Normocytic Anemia Dysuria Hyperglycemia Acute Encephalopathy  Depression  Substance Abuse Suspected Heroin Overdose                                                                        DISCHARGE PLAN BY DIAGNOSIS     Acute Respiratory Failure LLL PNA Acute on Chronic Sinusitis Tobacco Abuse  Discharge Plan: -5 days levaquin post discharge to complete 10 days total abx -smoking cessation counseling at time of discharge -follow up with [facility] to establish PCP   Hx of IV Drug Use Sinus Tachycardia Hypotension   Discharge Plan: -Drug abuse counseling while inpatient, given outpatient resources for discharge -encouraged patient to attend NA meeting daily -hypotension / tachycardia resolved, no further follow up  Hypokalemia Hypoalbuminemia Lactic Acidosis  Discharge Plan: -resolved  Hepatitis C Thrombocytopenia Normocytic Anemia  Discharge Plan: -Anemia / thrombocytopenia resolved, no further follow up -establish care with PCP for follow up of LFT trend / Hep C care -avoid hepatotoxic agents  Dysuria  Discharge Plan: -resolved.   Hyperglycemia  Discharge Plan: -resolved.   Acute Encephalopathy  Depression  Substance Abuse Suspected Heroin Overdose  Discharge Plan: -outpatient counseling information given to patient at time of discharge -encouraged him to attend NA program & contact his sponsor -pt encouraged to seek outpatient counseling for depression -instructed patient to stop using heroin, discussed side effects to include critical illness &  death                 DISCHARGE SUMMARY   Oscar Burke is a 33 y.o. y/o male with a PMH of tobacco & polysubstance abuse who presented to [facility] ER on 12/13/13 after being found unresponsive by his girlfriend.  She reported on admission that he has been trying to get clean for the past few months. However, am of admit, she found him in the bathroom slumped over and unresponsive.  EMS activated and gave patient Narcan with subsequent vomiting & agitation.  Post arrival to ER, he was hypertensive and had fever of 104.6.  He required intubation in the setting of AMS.  CT of the head noted no acute bleeding / CVA but noted acute on chronic sinusitis.  Initial CXR demonstrated increased density in R perihilar region concerning for atelectasis vs PNA.  He was empirically treated with IV abx for PNA.  Hospital course noted worsening of LLL airspace disease and fevers.  He remained intubated until 4/11 at which time he was liberated from mechanical ventilation.  ECHO evaluated and revealed normal LV function & no vegetation.  Respiratory cultures were positive for MSSA and pneumococcus that was sensitive to levaquin.  Patient will complete 10 days antibiotics.  See discharge plan as above.               SIGNIFICANT EVENTS / STUDIES:  4/8 Admitted for suspected heroine overdose, responded to narcan. Hypertensive with persistent fever(Tmax 104.6)  4/8 Head  CT with no acute abnormality. Acute on chronic sinusitis  4/8 CXR Increased density in the right perihilar region, representing possible subsegmental atelectasis or pneumonia  4/9 CXR Development of bilateral pulmonary alveolar infiltrates, particularly left lower lobe.  4/10 Persistent fevers overnight  4/10 CXR with worsening consolidation and collapse in left lower lobe  4/10 echo - nml LV fn, no veg   LINES / TUBES:  ETT 4/8 >> 04/11  OGT 4/8 >> 04/11  Foley 4/8 >> 04/11   CULTURES:  MRSA 4/8 NEG  UC 4/8 NG  BC 4/9 >>ng  RC (tracheal)  4/9 >> MSSA, pneumococcus  Strep Pneum 4/9 NEG  Resp panel 4/10 >> not done Urine culture 04/12   ANTIBIOTICS:  IV Levaquin 4/9 >>  IV Vanc 4/9 >> 4/12    Discharge Exam: General: wdwn adult male in NAD Neuro: AAO x 3, responding appropriately, MAE HEENT: no abnormalities  Cardiovascular: Normal rate and rhythm  Lungs: Diminished breath sounds  Abdomen: Soft, +BS, tolerating PO's Musculoskeletal: SCD's, no edema  Skin: Intact. Multiple tattoos throughout body   Filed Vitals:   12/17/13 1700 12/17/13 1742 12/17/13 2140 12/18/13 0622  BP: 102/70 115/74 117/71 109/67  Pulse:  83 63 55  Temp:  98.4 F (36.9 C) 99.5 F (37.5 C) 98.4 F (36.9 C)  TempSrc:  Oral Oral Oral  Resp:  _0 Height:      Weight:   166 lb 3.2 oz (75.388 kg)   SpO2:  97% 94% 96%     Discharge Labs  BMET  Recent Labs Lab 12/13/13 0956  12/14/13 0411 12/14/13 1320 12/14/13 2246 12/15/13 0220 12/15/13 1208 12/15/13 2338 12/16/13 2037 12/18/13 0714  NA 136*  --  140  --   --  139  --   --  147 141  K 3.7  --  4.0  --   --  3.4*  --   --  4.1 3.9  CL 95*  --  105  --   --  104  --   --  109 101  CO2 23  --  22  --   --  23  --   --  24 25  GLUCOSE 221*  --  80  --   --  140*  --   --  88 99  BUN 15  --  11  --   --  6  --   --  7 13  CREATININE 1.12  --  0.85  --   --  0.79  --   --  0.76 0.77  CALCIUM 8.8  --  8.4  --   --  8.1*  --   --  9.0 9.4  MG  --   < > 1.8 2.4 2.0  --  2.0 1.8  --  2.0  PHOS  --   < > 1.8* 1.5* 1.4*  --  1.4* 1.8*  --  3.2  < > = values in this interval not displayed.  CBC  Recent Labs Lab 12/15/13 0220 12/16/13 2037 12/18/13 0714  HGB 12.3* 13.9 15.0  HCT 36.6* 40.8 43.8  WBC 14.7* 12.2* 9.5  PLT 127* 170 207       Medication List         levofloxacin 750 MG tablet  Commonly known as:  LEVAQUIN  Take 1 tablet (750 mg total) by mouth daily.       Follow-up Information   Follow up with [facility]    .  Schedule an appointment as soon as possible for a visit on 12/25/2013. (For primary care needs. Appointment is at 3pm)    Contact information:   [place] [id number] [phone]      Disposition:  Home.  No new home needs identified.  Outpatient drug / rehab counseling information given to patient.   Discharged Condition: Elridge Stemm has met maximum benefit of inpatient care and is medically stable and cleared for discharge.  Patient is pending follow up as above.      Time spent on disposition:  Greater than 35 minutes.   Signed: Noe Gens, NP-C [facility] Pgr: [phone] Office: [phone]    Independently examined pt, evaluated data & formulated above discharge care plan with NP who scribed this note & edited by me.  Rigoberto Noel MD

## 2013-12-18 NOTE — Progress Notes (Signed)
Oscar ParrAnthony Burke to be D/C'd Home per MD order.  Discussed with the patient and all questions fully answered.    Medication List         levofloxacin 750 MG tablet  Commonly known as:  LEVAQUIN  Take 1 tablet (750 mg total) by mouth daily.        VVS, Skin clean, dry and intact. Scattered bruising and track marks present. IV catheter discontinued intact. Site without signs and symptoms of complications. Dressing and pressure applied.  An After Visit Summary was printed and given to the patient. Patient escorted via WC, and D/C home via private auto.  Rhae LernerCortney D [name] 12/18/2013 10:53 AM

## 2013-12-18 NOTE — Progress Notes (Signed)
LATE ENTRY  Gave pt resource list for inpatient and outpatient substance abuse programs.   Maryclare LabradorJulie Elexa Kivi, MSW, [facility]WA Clinical Social Worker [phone]

## 2013-12-20 LAB — CULTURE, BLOOD (ROUTINE X 2)
Culture: NO GROWTH
Culture: NO GROWTH

## 2013-12-25 ENCOUNTER — Inpatient Hospital Stay: Payer: Self-pay | Admitting: Internal Medicine

## 2014-01-22 ENCOUNTER — Other Ambulatory Visit: Payer: Self-pay

## 2014-01-24 ENCOUNTER — Ambulatory Visit: Payer: Self-pay | Attending: Internal Medicine | Admitting: *Deleted

## 2014-01-24 DIAGNOSIS — Z599 Problem related to housing and economic circumstances, unspecified: Secondary | ICD-10-CM

## 2014-01-24 NOTE — Progress Notes (Signed)
LCSW met with patient in order assess social needs. Patient identified that he needed medical care after recent hospitalization for complications with Hep C and drug overrdose.  LCSW provided links for substance abuse and mental health treatment as well as information to get medical care with [facility].  Patient has some concerns about overall health plan, LCSW helped patient identify plan of action so that his medical and MHSA needs can be met.    Christene Lye MSW, LCSW

## 2014-02-08 ENCOUNTER — Ambulatory Visit: Payer: Self-pay | Admitting: Internal Medicine

## 2014-02-13 ENCOUNTER — Ambulatory Visit: Payer: Self-pay

## 2014-04-10 ENCOUNTER — Emergency Department (HOSPITAL_COMMUNITY)
Admission: EM | Admit: 2014-04-10 | Discharge: 2014-04-10 | Disposition: A | Discharge status: Home / Self Care | Payer: Self-pay | Attending: Emergency Medicine | Admitting: Emergency Medicine

## 2014-04-10 ENCOUNTER — Encounter (HOSPITAL_COMMUNITY): Payer: Self-pay | Admitting: Emergency Medicine

## 2014-04-10 DIAGNOSIS — F17200 Nicotine dependence, unspecified, uncomplicated: Secondary | ICD-10-CM | POA: Insufficient documentation

## 2014-04-10 DIAGNOSIS — R109 Unspecified abdominal pain: Secondary | ICD-10-CM | POA: Insufficient documentation

## 2014-04-10 DIAGNOSIS — F19939 Other psychoactive substance use, unspecified with withdrawal, unspecified: Principal | ICD-10-CM | POA: Insufficient documentation

## 2014-04-10 DIAGNOSIS — F1910 Other psychoactive substance abuse, uncomplicated: Secondary | ICD-10-CM | POA: Insufficient documentation

## 2014-04-10 DIAGNOSIS — F1110 Opioid abuse, uncomplicated: Secondary | ICD-10-CM | POA: Insufficient documentation

## 2014-04-10 DIAGNOSIS — E860 Dehydration: Secondary | ICD-10-CM | POA: Insufficient documentation

## 2014-04-10 DIAGNOSIS — Z880 Allergy status to penicillin: Secondary | ICD-10-CM | POA: Insufficient documentation

## 2014-04-10 DIAGNOSIS — R197 Diarrhea, unspecified: Secondary | ICD-10-CM | POA: Insufficient documentation

## 2014-04-10 DIAGNOSIS — F1123 Opioid dependence with withdrawal: Secondary | ICD-10-CM

## 2014-04-10 DIAGNOSIS — F1193 Opioid use, unspecified with withdrawal: Secondary | ICD-10-CM

## 2014-04-10 DIAGNOSIS — R110 Nausea: Secondary | ICD-10-CM | POA: Insufficient documentation

## 2014-04-10 LAB — COMPREHENSIVE METABOLIC PANEL
ALT: 24 U/L (ref 0–53)
AST: 52 U/L — ABNORMAL HIGH (ref 0–37)
Albumin: 4 g/dL (ref 3.5–5.2)
Alkaline Phosphatase: 61 U/L (ref 39–117)
Anion gap: 11 (ref 5–15)
BUN: 18 mg/dL (ref 6–23)
CALCIUM: 9.6 mg/dL (ref 8.4–10.5)
CO2: 30 meq/L (ref 19–32)
CREATININE: 0.89 mg/dL (ref 0.50–1.35)
Chloride: 94 mEq/L — ABNORMAL LOW (ref 96–112)
GLUCOSE: 107 mg/dL — AB (ref 70–99)
Potassium: 6 mEq/L — ABNORMAL HIGH (ref 3.7–5.3)
Sodium: 135 mEq/L — ABNORMAL LOW (ref 137–147)
Total Bilirubin: 0.6 mg/dL (ref 0.3–1.2)
Total Protein: 8 g/dL (ref 6.0–8.3)

## 2014-04-10 LAB — CBC WITH DIFFERENTIAL/PLATELET
Basophils Absolute: 0 10*3/uL (ref 0.0–0.1)
Basophils Relative: 0 % (ref 0–1)
EOS PCT: 0 % (ref 0–5)
Eosinophils Absolute: 0 10*3/uL (ref 0.0–0.7)
HEMATOCRIT: 54.2 % — AB (ref 39.0–52.0)
Hemoglobin: 18.4 g/dL — ABNORMAL HIGH (ref 13.0–17.0)
LYMPHS ABS: 2.3 10*3/uL (ref 0.7–4.0)
Lymphocytes Relative: 20 % (ref 12–46)
MCH: 29.7 pg (ref 26.0–34.0)
MCHC: 33.9 g/dL (ref 30.0–36.0)
MCV: 87.6 fL (ref 78.0–100.0)
MONO ABS: 1.1 10*3/uL — AB (ref 0.1–1.0)
Monocytes Relative: 10 % (ref 3–12)
Neutro Abs: 7.7 10*3/uL (ref 1.7–7.7)
Neutrophils Relative %: 70 % (ref 43–77)
Platelets: 241 10*3/uL (ref 150–400)
RBC: 6.19 MIL/uL — AB (ref 4.22–5.81)
RDW: 12.8 % (ref 11.5–15.5)
WBC: 11.2 10*3/uL — ABNORMAL HIGH (ref 4.0–10.5)

## 2014-04-10 LAB — RAPID URINE DRUG SCREEN, HOSP PERFORMED
AMPHETAMINES: NOT DETECTED
Barbiturates: NOT DETECTED
Benzodiazepines: POSITIVE — AB
COCAINE: POSITIVE — AB
OPIATES: NOT DETECTED
Tetrahydrocannabinol: NOT DETECTED

## 2014-04-10 LAB — ETHANOL: Alcohol, Ethyl (B): <11 mg/dL (ref 0–11)

## 2014-04-10 LAB — LIPASE, BLOOD: LIPASE: 38 U/L (ref 11–59)

## 2014-04-10 MED ORDER — SODIUM CHLORIDE 0.9 % IV BOLUS (SEPSIS)
1000.0000 mL | Freq: Once | INTRAVENOUS | Status: AC
  Administered 2014-04-10: 1000 mL via INTRAVENOUS

## 2014-04-10 MED ORDER — LOPERAMIDE HCL 2 MG PO CAPS: 2.0000 mg | ORAL_CAPSULE | Freq: Four times a day (QID) | ORAL | Status: DC | PRN

## 2014-04-10 MED ORDER — ONDANSETRON HCL 4 MG/2ML IJ SOLN
4.0000 mg | Freq: Once | INTRAMUSCULAR | Status: AC
  Administered 2014-04-10: 4 mg via INTRAVENOUS
  Filled 2014-04-10: qty 2

## 2014-04-10 MED ORDER — PROMETHAZINE HCL 25 MG PO TABS: 25.0000 mg | ORAL_TABLET | Freq: Four times a day (QID) | ORAL | Status: DC | PRN

## 2014-04-10 MED ORDER — DIPHENHYDRAMINE HCL 25 MG PO CAPS
25.0000 mg | ORAL_CAPSULE | Freq: Once | ORAL | Status: DC
Start: 2014-04-10 — End: 2014-04-10
  Filled 2014-04-10: qty 1

## 2014-04-10 NOTE — Discharge Instructions (Signed)
Opioid Withdrawal  Opioids are a group of narcotic drugs. They include the street drug heroin. They also include pain medicines, such as morphine, hydrocodone, oxycodone, and fentanyl. Opioid withdrawal is a group of characteristic physical and mental signs and symptoms. It typically occurs if you have been using opioids daily for several weeks or longer and stop using or rapidly decrease use. Opioid withdrawal can also occur if you have used opioids daily for a long time and are given a medicine to block the effect.   SIGNS AND SYMPTOMS  Opioid withdrawal includes three or more of the following symptoms:   · Depressed, anxious, or irritable mood.  · Nausea or vomiting.  · Muscle aches or spasms.    · Watery eyes.     · Runny nose.  · Dilated pupils, sweating, or hairs standing on end.  · Diarrhea or intestinal cramping.  · Yawning.    · Fever.  · Increased blood pressure.  · Fast pulse.  · Restlessness or trouble sleeping.  These signs and symptoms occur within several hours of stopping or reducing short-acting opioids, such as heroin. They can occur within 3 days of stopping or reducing long-acting opioids, such as methadone. Withdrawal begins within minutes of receiving a drug that blocks the effects of opioids, such as naltrexone or naloxone.  DIAGNOSIS   Opioid use disorder is diagnosed by your health care provider. You will be asked about your symptoms, drug and alcohol use, medical history, and use of medicines. A physical exam may be done. Lab tests may be ordered. Your health care provider may have you see a mental health professional.   TREATMENT   The treatment for opioid withdrawal is usually provided by medical doctors with special training in substance use disorders (addiction specialists). The following medicines may be included in treatment:  · Opioids given in place of the abused opioid. They turn on opioid receptors in the brain and lessen or prevent withdrawal symptoms. They are gradually  decreased (opioid substitution and taper).  · Non-opioids that can lessen certain opioid withdrawal symptoms. They may be used alone or with opioid substitution and taper.  Successful long-term recovery usually requires medicine, counseling, and group support.  HOME CARE INSTRUCTIONS   · Take medicines only as directed by your health care provider.  · Check with your health care provider before starting new medicines.  · Keep all follow-up visits as directed by your health care provider.  SEEK MEDICAL CARE IF:  · You are not able to take your medicines as directed.  · Your symptoms get worse.  · You relapse.  SEEK IMMEDIATE MEDICAL CARE IF:  · You have serious thoughts about hurting yourself or others.  · You have a seizure.  · You lose consciousness.  Document Released: 08/27/2003 Document Revised: 01/08/2014 Document Reviewed: 09/06/2013  ExitCare® Patient Information ©2015 [employer]. This information is not intended to replace advice given to you by your health care provider. Make sure you discuss any questions you have with your health care provider.

## 2014-04-10 NOTE — Progress Notes (Signed)
[facility] provided pt with a [facility] Orange Card application, highlighting [facility], to help patient establish primary care.  °

## 2014-04-10 NOTE — ED Notes (Signed)
[facility] Expected date:  Expected time:  Means of arrival:  Comments: EMS-detox

## 2014-04-10 NOTE — ED Notes (Addendum)
Per EMS, pt has been trying to "self-detox" over the past 2-3 days from opiates, complains of nausea/vomiting since Sunday. [facility], where EMS states he was given ativan and nausea medication. Pt states he could not afford to fill prescriptions given and still complains of n/v and abd pain, which he states started after vomiting for 2 days. Pt states he would like help finding resources for his drug problem. Pt given 4mg  zofran en route to hospital, which he states has helped with the nausea. Pt states he took a xanax this morning because he was feeling "panicky".

## 2014-04-10 NOTE — ED Provider Notes (Signed)
CSN: [id number]     Arrival date & time 04/10/14  1146 History   First MD Initiated Contact with Patient 04/10/14 1157     Chief Complaint  Patient presents with  . Drug Problem  . Nausea  . Abdominal Pain     (Consider location/radiation/quality/duration/timing/severity/associated sxs/prior Treatment) HPI  This is a 33 -year-old male with a history of polysubstance abuse he presents requesting detox.  Patient reports the he would like to detox from heroin and cocaine.  He also reports Molly ECT.  Last usage of all of these this Friday and Saturday.  Patient reports at that time he has been trying to self detox.  He reports feeling thirsty, having abdominal pain, nausea, and diarrhea.  Abdominal pain is diffuse and crampy.  Patient also reports Xanax use which he took this morning.  He is not interested in detoxing from this.  He denies HI or SI.  Past Medical History  Diagnosis Date  . Smoker    History reviewed. No pertinent past surgical history. No family history on file. History  Substance Use Topics  . Smoking status: Current Every Day Smoker  . Smokeless tobacco: Not on file  . Alcohol Use: Not on file    Review of Systems  Constitutional: Negative.  Negative for fever.  Respiratory: Negative.  Negative for chest tightness and shortness of breath.   Cardiovascular: Negative.  Negative for chest pain.  Gastrointestinal: Positive for nausea, abdominal pain and diarrhea. Negative for vomiting.  Genitourinary: Negative.  Negative for dysuria.  Musculoskeletal: Negative for back pain.  Skin: Negative for rash.  Neurological: Negative for headaches.  Psychiatric/Behavioral: Negative for suicidal ideas and agitation.  All other systems reviewed and are negative.     Allergies  Penicillins  Home Medications   Prior to Admission medications   Medication Sig Start Date End Date Taking? Authorizing Provider  loperamide (IMODIUM) 2 MG capsule Take 1 capsule (2 mg total)  by mouth 4 (four) times daily as needed for diarrhea or loose stools. 04/10/14   Shon Baton, MD  promethazine (PHENERGAN) 25 MG tablet Take 1 tablet (25 mg total) by mouth every 6 (six) hours as needed for nausea or vomiting. 04/10/14   Shon Baton, MD   BP 100/60  Pulse 47  Temp(Src) 97.9 F (36.6 C) (Oral)  Resp 16  SpO2 94% Physical Exam  Nursing note and vitals reviewed. Constitutional: He is oriented to person, place, and time. No distress.  thin  HENT:  Head: Normocephalic and atraumatic.  Mucous membranes dry  Eyes: Pupils are equal, round, and reactive to light.  Cardiovascular: Normal rate, regular rhythm and normal heart sounds.   No murmur heard. Pulmonary/Chest: Effort normal and breath sounds normal. No respiratory distress. He has no wheezes.  Abdominal: Soft. Bowel sounds are normal. There is no tenderness. There is no rebound and no guarding.  Musculoskeletal: He exhibits no edema.  Lymphadenopathy:    He has no cervical adenopathy.  Neurological: He is alert and oriented to person, place, and time.  Skin: Skin is warm and dry.  Multiple tattoos  Psychiatric: He has a normal mood and affect.    ED Course  Procedures (including critical care time) Labs Review Labs Reviewed  CBC WITH DIFFERENTIAL - Abnormal; Notable for the following:    WBC 11.2 (*)    RBC 6.19 (*)    Hemoglobin 18.4 (*)    HCT 54.2 (*)    Monocytes Absolute 1.1 (*)  All other components within normal limits  COMPREHENSIVE METABOLIC PANEL - Abnormal; Notable for the following:    Sodium 135 (*)    Potassium 6.0 (*)    Chloride 94 (*)    Glucose, Bld 107 (*)    AST 52 (*)    All other components within normal limits  URINE RAPID DRUG SCREEN (HOSP PERFORMED) - Abnormal; Notable for the following:    Cocaine POSITIVE (*)    Benzodiazepines POSITIVE (*)    All other components within normal limits  LIPASE, BLOOD  ETHANOL  POTASSIUM    Imaging Review No results found.    EKG Interpretation None      MDM   Final diagnoses:  Heroin withdrawal  Polysubstance abuse    Patient presents with request for detox.  Reports polysubstance abuse with heroin, cocaine, mildly, and Xanax.  He does not wish to detox from NSAIDs.  He reports abdominal pain, nausea, and diarrhea.  Mucous membranes are dry and he appears dehydrated.  Discussed with patient that while uncomfortable, detox from opioids and cocaine is not life-threatening.  He can be given outpatient resources.  Will obtain lab work to evaluate for a duration status.  The patient appears volume contracted.  The patient was given 2 L of normal saline and Zofran for his nausea.  He has an ambulatory in the ER.  Potassium is 6.0 with negative hemolysis.  Repeat ordered.  However, patient is requesting discharge so he can take a Xanax as he is feeling anxious and I have told him he will not receive benzodiazepines from me.  Feel patient's potassium is likely falsely high secondary to hemolysis.  After history, exam, and medical workup I feel the patient has been appropriately medically screened and is safe for discharge home. Pertinent diagnoses were discussed with the patient. Patient was given return precautions.    Shon Batonourtney F Horton, MD 04/10/14 [phone]

## 2014-07-22 ENCOUNTER — Emergency Department (HOSPITAL_COMMUNITY)
Admission: EM | Admit: 2014-07-22 | Discharge: 2014-07-24 | Disposition: A | Discharge status: Home / Self Care | Payer: Self-pay | Attending: Emergency Medicine | Admitting: Emergency Medicine

## 2014-07-22 ENCOUNTER — Encounter (HOSPITAL_COMMUNITY): Payer: Self-pay | Admitting: Emergency Medicine

## 2014-07-22 DIAGNOSIS — F1410 Cocaine abuse, uncomplicated: Secondary | ICD-10-CM | POA: Insufficient documentation

## 2014-07-22 DIAGNOSIS — Z8739 Personal history of other diseases of the musculoskeletal system and connective tissue: Secondary | ICD-10-CM | POA: Insufficient documentation

## 2014-07-22 DIAGNOSIS — Z880 Allergy status to penicillin: Secondary | ICD-10-CM | POA: Insufficient documentation

## 2014-07-22 DIAGNOSIS — G8929 Other chronic pain: Secondary | ICD-10-CM | POA: Insufficient documentation

## 2014-07-22 DIAGNOSIS — Z720 Tobacco use: Secondary | ICD-10-CM | POA: Insufficient documentation

## 2014-07-22 DIAGNOSIS — F319 Bipolar disorder, unspecified: Secondary | ICD-10-CM | POA: Insufficient documentation

## 2014-07-22 DIAGNOSIS — F1019 Alcohol abuse with unspecified alcohol-induced disorder: Secondary | ICD-10-CM

## 2014-07-22 DIAGNOSIS — F10229 Alcohol dependence with intoxication, unspecified: Secondary | ICD-10-CM | POA: Insufficient documentation

## 2014-07-22 HISTORY — DX: Bipolar disorder, unspecified: F31.9

## 2014-07-22 HISTORY — DX: Other psychoactive substance abuse, uncomplicated: F19.10

## 2014-07-22 HISTORY — DX: Other chronic pain: G89.29

## 2014-07-22 HISTORY — DX: Dorsalgia, unspecified: M54.9

## 2014-07-22 LAB — CBC WITH DIFFERENTIAL/PLATELET
BASOS ABS: 0 10*3/uL (ref 0.0–0.1)
BASOS PCT: 0 % (ref 0–1)
Eosinophils Absolute: 0.2 10*3/uL (ref 0.0–0.7)
Eosinophils Relative: 3 % (ref 0–5)
HEMATOCRIT: 42 % (ref 39.0–52.0)
Hemoglobin: 13.9 g/dL (ref 13.0–17.0)
LYMPHS ABS: 2.1 10*3/uL (ref 0.7–4.0)
Lymphocytes Relative: 38 % (ref 12–46)
MCH: 30 pg (ref 26.0–34.0)
MCHC: 33.1 g/dL (ref 30.0–36.0)
MCV: 90.5 fL (ref 78.0–100.0)
MONOS PCT: 10 % (ref 3–12)
Monocytes Absolute: 0.5 10*3/uL (ref 0.1–1.0)
NEUTROS ABS: 2.6 10*3/uL (ref 1.7–7.7)
Neutrophils Relative %: 49 % (ref 43–77)
Platelets: 164 10*3/uL (ref 150–400)
RBC: 4.64 MIL/uL (ref 4.22–5.81)
RDW: 14.2 % (ref 11.5–15.5)
WBC: 5.4 10*3/uL (ref 4.0–10.5)

## 2014-07-22 LAB — COMPREHENSIVE METABOLIC PANEL
ALT: 75 U/L — ABNORMAL HIGH (ref 0–53)
AST: 55 U/L — ABNORMAL HIGH (ref 0–37)
Albumin: 3.3 g/dL — ABNORMAL LOW (ref 3.5–5.2)
Alkaline Phosphatase: 63 U/L (ref 39–117)
Anion gap: 12 (ref 5–15)
BILIRUBIN TOTAL: 0.2 mg/dL — AB (ref 0.3–1.2)
BUN: 9 mg/dL (ref 6–23)
CO2: 28 meq/L (ref 19–32)
CREATININE: 0.89 mg/dL (ref 0.50–1.35)
Calcium: 9.3 mg/dL (ref 8.4–10.5)
Chloride: 101 mEq/L (ref 96–112)
Glucose, Bld: 92 mg/dL (ref 70–99)
Potassium: 4.1 mEq/L (ref 3.7–5.3)
Sodium: 141 mEq/L (ref 137–147)
Total Protein: 6.2 g/dL (ref 6.0–8.3)

## 2014-07-22 LAB — RAPID URINE DRUG SCREEN, HOSP PERFORMED
AMPHETAMINES: NOT DETECTED
BARBITURATES: NOT DETECTED
Benzodiazepines: NOT DETECTED
Cocaine: POSITIVE — AB
OPIATES: NOT DETECTED
TETRAHYDROCANNABINOL: NOT DETECTED

## 2014-07-22 LAB — ETHANOL

## 2014-07-22 LAB — SALICYLATE LEVEL: Salicylate Lvl: <2 mg/dL — ABNORMAL LOW (ref 2.8–20.0)

## 2014-07-22 LAB — ACETAMINOPHEN LEVEL

## 2014-07-22 MED ORDER — LORAZEPAM 1 MG PO TABS
1.0000 mg | ORAL_TABLET | Freq: Three times a day (TID) | ORAL | Status: DC | PRN
  Administered 2014-07-22 – 2014-07-23 (×3): 1 mg via ORAL
  Filled 2014-07-22 (×4): qty 1

## 2014-07-22 MED ORDER — ALUM & MAG HYDROXIDE-SIMETH 200-200-20 MG/5ML PO SUSP: 30.0000 mL | ORAL | Status: DC | PRN

## 2014-07-22 MED ORDER — IBUPROFEN 400 MG PO TABS
600.0000 mg | ORAL_TABLET | Freq: Three times a day (TID) | ORAL | Status: DC | PRN
  Administered 2014-07-22 – 2014-07-23 (×3): 600 mg via ORAL
  Filled 2014-07-22 (×8): qty 1

## 2014-07-22 MED ORDER — ONDANSETRON HCL 4 MG PO TABS: 4.0000 mg | ORAL_TABLET | Freq: Three times a day (TID) | ORAL | Status: DC | PRN

## 2014-07-22 MED ORDER — SODIUM CHLORIDE 0.9 % IV BOLUS (SEPSIS)
1000.0000 mL | Freq: Once | INTRAVENOUS | Status: AC
  Administered 2014-07-22: 1000 mL via INTRAVENOUS

## 2014-07-22 MED ORDER — NICOTINE 21 MG/24HR TD PT24
21.0000 mg | MEDICATED_PATCH | Freq: Every day | TRANSDERMAL | Status: DC | PRN
  Administered 2014-07-23 – 2014-07-24 (×2): 21 mg via TRANSDERMAL
  Filled 2014-07-22 (×2): qty 1

## 2014-07-22 MED ORDER — ACETAMINOPHEN 325 MG PO TABS: 650.0000 mg | ORAL_TABLET | ORAL | Status: DC | PRN

## 2014-07-22 MED ORDER — ZOLPIDEM TARTRATE 5 MG PO TABS
5.0000 mg | ORAL_TABLET | Freq: Every evening | ORAL | Status: DC | PRN
  Administered 2014-07-23: 5 mg via ORAL
  Filled 2014-07-22: qty 1
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
## 2014-07-22 NOTE — BH Assessment (Signed)
Assessment complete. Rosey BathKelly Southard, [facility], confirms adult unit is at capacity. Gave clinical report to Nanine MeansJamison Lord, NP who recommends Pt be evaluated by psychiatry later this morning to determine if Pt can be released from IVC or if inpatient treatment is necessary. Notified Dr. Samuel JesterKathleen McManus and Anell BarrKaren Cobb, RN of recommendation.  Harlin RainFord Ellis Ria CommentWarrick Jr, LPC, [facility] Triage Specialist [phone]

## 2014-07-22 NOTE — BH Assessment (Addendum)
[facility] Assessment Progress Note   The following facilities were called by this clinician in an attempt to place the pt:  Faxed for [facility] - taking referrals per Suzie OV- can send referral for possible wait list per Flint MelterKristen Moore - adult beds per [facility] - can send for possible wait list per Truddie HiddenJamelia Brynn Marr - possible adult beds per Margarito LinerEsmirna Davis - one male bed per Dorathy DaftKayla  [facility] [place] [facility] per [facility] per [facility] per [facility] per [facility] [place] [facility] [place] per [facility] per [facility] per Jeanella Caraonya [facility] per [facility] G[place] per Zollie Scalelivia   No Answer Abran CantorFrye @ [address] [facility] - left message @ [address] @ [address] [place] @ [address]  TTS will continue to seek placement for the pt. [facility].  Oscar LaniusKristen Novalee Horsfall, MS, [facility] Licensed Professional Counselor Therapeutic Triage Specialist [name] Phone: [phone] Fax: [phone]

## 2014-07-22 NOTE — ED Notes (Signed)
PT'S GIRLFRIEND NOT ALLOWED TO VISIT TODAY D/T PT UPSET ABOUT BEING IVC'D AND MUST STAY IN ED, PER DR ALLEN. NURSE 1ST AWARE.

## 2014-07-22 NOTE — BH Assessment (Signed)
Received call for assessment. Spoke with Dr. Samuel JesterKathleen McManus who said Pt reports Pt took a couple extra Latuda in addition to ibuprofen due to pain and Pt's girlfriend reports he took more. Pt denies SI. Tele-assessment will be initiated.  Harlin RainFord Ellis Ria CommentWarrick Jr, LPC, [facility] Triage Specialist [phone]
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
## 2014-07-22 NOTE — ED Notes (Signed)
Ate 100% of snack.  Pt is cooperative, denies any complaints.  Asking to call girlfriend, explained to pt that he may have a room shortly and that we will assist him with calls then.  Pt agreeable.

## 2014-07-22 NOTE — ED Notes (Signed)
[employer] brought IVC paperwork,

## 2014-07-22 NOTE — ED Notes (Signed)
Pt moved to room 28  Sitter at bedside.  Pt belongings take to unit.

## 2014-07-22 NOTE — ED Notes (Signed)
Pt's girlfriend st's pt took a bottle of Latuda 60mg .  Pt st's he took only 3-4 pills .   Also st's he took 4 or 5 Ibuprofen approx 1 hour ago.  Pt falling asleep while being triaged  Pt denies  Being HI or SI

## 2014-07-22 NOTE — ED Notes (Signed)
Poison control called-- updated on patient.

## 2014-07-22 NOTE — ED Notes (Signed)
Staffing office notified of need for sitter. 

## 2014-07-22 NOTE — ED Notes (Addendum)
PT AWARE GIRLFRIEND, JUDY, NOT ALLOWED TO VISIT. ALSO AWARE NO MORE PHONE CALLS TODAY D/T MADE 2 PHONE CALLS WHEN ARRIVED TO [facility]. GIRLFRIEND'S PHONE NUMBER [phone]

## 2014-07-22 NOTE — ED Notes (Addendum)
Spoke with Ala DachFord from [facility]-- plan is for pt to be evaluated by a psychiatrist today-- to re-evaluate IVC orders. Pt has been wanded by security and has a Comptrollersitter at bedside.

## 2014-07-22 NOTE — ED Provider Notes (Signed)
CSN: [id number]     Arrival date & time 07/22/14  0040 History   First MD Initiated Contact with Patient 07/22/14 0154     Chief Complaint  Patient presents with  . Drug Overdose      HPI  Pt was seen at 0150. Per pt, pt was brought in under IVC by his girlfriend for allegedly "[place]" [place] Pt states he has hx of chronic back pain and depression. States he "missed a few doses of the latuda, so I took 3 or 4 pills to catch up." Unknown time he took these pills. Pt states he also took "4 or 5 ibuprofen" a few hours PTA "because of my back pain." Pt states he also used cocaine and drank a beer last night. Pt denies SI/SA. Denies HI, no hallucinations. Denies CP/SOB, no abd pain, no N/V/D.      Past Medical History  Diagnosis Date  . Smoker   . Chronic back pain   . Bipolar 1 disorder   . Polysubstance abuse    History reviewed. No pertinent past surgical history.  History  Substance Use Topics  . Smoking status: Current Every Day Smoker  . Smokeless tobacco: Not on file  . Alcohol Use: Yes    Review of Systems ROS: Statement: All systems negative except as marked or noted in the HPI; Constitutional: Negative for fever and chills. ; ; Eyes: Negative for eye pain, redness and discharge. ; ; ENMT: Negative for ear pain, hoarseness, nasal congestion, sinus pressure and sore throat. ; ; Cardiovascular: Negative for chest pain, palpitations, diaphoresis, dyspnea and peripheral edema. ; ; Respiratory: Negative for cough, wheezing and stridor. ; ; Gastrointestinal: Negative for nausea, vomiting, diarrhea, abdominal pain, blood in stool, hematemesis, jaundice and rectal bleeding. . ; ; Genitourinary: Negative for dysuria, flank pain and hematuria. ; ; Musculoskeletal: Negative for back pain and neck pain. Negative for swelling and trauma.; ; Skin: Negative for pruritus, rash, abrasions, blisters, bruising and skin lesion.; ; Neuro: Negative for headache,  lightheadedness and neck stiffness. Negative for weakness, altered level of consciousness , altered mental status, extremity weakness, paresthesias, involuntary movement, seizure and syncope.; Psych:  No SI, no SA, no HI, no hallucinations.   Allergies  Penicillins  Home Medications   Prior to Admission medications   Medication Sig Start Date End Date Taking? Authorizing Provider  Lurasidone HCl (LATUDA) 60 MG TABS Take 60 mg by mouth at bedtime.   Yes Historical Provider, MD  loperamide (IMODIUM) 2 MG capsule Take 1 capsule (2 mg total) by mouth 4 (four) times daily as needed for diarrhea or loose stools. Patient not taking: Reported on 07/22/2014 04/10/14   Shon Batonourtney F Horton, MD  promethazine (PHENERGAN) 25 MG tablet Take 1 tablet (25 mg total) by mouth every 6 (six) hours as needed for nausea or vomiting. Patient not taking: Reported on 07/22/2014 04/10/14   Shon Batonourtney F Horton, MD   BP 102/58 mmHg  Pulse 75  Temp(Src) 98 F (36.7 C) (Oral)  Resp 18  Ht 6' (1.829 m)  Wt 167 lb (75.751 kg)  BMI 22.64 kg/m2  SpO2 100% Physical Exam  0155: Physical examination:  Nursing notes reviewed; Vital signs and O2 SAT reviewed;  Constitutional: Well developed, Well nourished, Well hydrated, In no acute distress; Head:  Normocephalic, atraumatic; Eyes: EOMI, PERRL, No scleral icterus; ENMT: Mouth and pharynx normal, Mucous membranes moist; Neck: Supple, Full range of motion, No lymphadenopathy; Cardiovascular: Regular rate and rhythm,  No murmur, rub, or gallop; Respiratory: Breath sounds clear & equal bilaterally, No rales, rhonchi, wheezes.  Speaking full sentences with ease, Normal respiratory effort/excursion; Chest: Nontender, Movement normal; Abdomen: Soft, Nontender, Nondistended, Normal bowel sounds; Genitourinary: No CVA tenderness; Extremities: Pulses normal, No tenderness, No edema, No calf edema or asymmetry.; Neuro: AA&Ox3, Major CN grossly intact.  Speech clear. No gross focal motor or sensory  deficits in extremities. Climbs on and off stretcher easily by himself. Gait steady.; Skin: Color normal, Warm, Dry.; Psych:  Denies SI, no HI, no hallucinations. Calm and cooperative.    ED Course  Procedures     MDM  MDM Reviewed: previous chart, nursing note and vitals Reviewed previous: labs Interpretation: labs   Results for orders placed or performed during the hospital encounter of 07/22/14  Urine rapid drug screen (hosp performed)  Result Value Ref Range   Opiates NONE DETECTED NONE DETECTED   Cocaine POSITIVE (A) NONE DETECTED   Benzodiazepines NONE DETECTED NONE DETECTED   Amphetamines NONE DETECTED NONE DETECTED   Tetrahydrocannabinol NONE DETECTED NONE DETECTED   Barbiturates NONE DETECTED NONE DETECTED  CBC with Differential  Result Value Ref Range   WBC 5.4 4.0 - 10.5 K/uL   RBC 4.64 4.22 - 5.81 MIL/uL   Hemoglobin 13.9 13.0 - 17.0 g/dL   HCT 16.142.0 09.639.0 - 04.552.0 %   MCV 90.5 78.0 - 100.0 fL   MCH 30.0 26.0 - 34.0 pg   MCHC 33.1 30.0 - 36.0 g/dL   RDW 40.914.2 81.111.5 - 91.415.5 %   Platelets 164 150 - 400 K/uL   Neutrophils Relative % 49 43 - 77 %   Lymphocytes Relative 38 12 - 46 %   Monocytes Relative 10 3 - 12 %   Eosinophils Relative 3 0 - 5 %   Basophils Relative 0 0 - 1 %   Neutro Abs 2.6 1.7 - 7.7 K/uL   Lymphs Abs 2.1 0.7 - 4.0 K/uL   Monocytes Absolute 0.5 0.1 - 1.0 K/uL   Eosinophils Absolute 0.2 0.0 - 0.7 K/uL   Basophils Absolute 0.0 0.0 - 0.1 K/uL  Comprehensive metabolic panel  Result Value Ref Range   Sodium 141 137 - 147 mEq/L   Potassium 4.1 3.7 - 5.3 mEq/L   Chloride 101 96 - 112 mEq/L   CO2 28 19 - 32 mEq/L   Glucose, Bld 92 70 - 99 mg/dL   BUN 9 6 - 23 mg/dL   Creatinine, Ser 7.820.89 0.50 - 1.35 mg/dL   Calcium 9.3 8.4 - 95.610.5 mg/dL   Total Protein 6.2 6.0 - 8.3 g/dL   Albumin 3.3 (L) 3.5 - 5.2 g/dL   AST 55 (H) 0 - 37 U/L   ALT 75 (H) 0 - 53 U/L   Alkaline Phosphatase 63 39 - 117 U/L   Total Bilirubin 0.2 (L) 0.3 - 1.2 mg/dL   GFR calc  non Af Amer >90 >90 mL/min   GFR calc Af Amer >90 >90 mL/min   Anion gap 12 5 - 15  Ethanol  Result Value Ref Range   Alcohol, Ethyl (B) <11 0 - 11 mg/dL  Acetaminophen level  Result Value Ref Range   Acetaminophen (Tylenol), Serum <15.0 10 - 30 ug/mL  Salicylate level  Result Value Ref Range   Salicylate Lvl <2.0 (L) 2.8 - 20.0 mg/dL    [id number]:  TTS has evaluated pt: continue IVC for now, states he will have psychiatrist evaluate pt in the morning. Pt remains calm  and cooperative with ED staff. Holding orders written.       Samuel Jester, DO 07/22/14 [phone]

## 2014-07-22 NOTE — ED Notes (Signed)
Breakfast tray given. °

## 2014-07-22 NOTE — ED Notes (Signed)
[facility]

## 2014-07-22 NOTE — ED Notes (Signed)
Tele evasluation completed. Pt aggitated that girlfriend took out papers on him.  Sitter remainis at bedside. Pt is cooperative but is asking when can he go home.  States his girlfriend is on meds for "issues" and that he only took 4 pills to help him sleep.  Denies suicidal intent

## 2014-07-22 NOTE — ED Notes (Signed)
Pt mis a former iv heroin user.  He uses cocaine now and his last  Use was today

## 2014-07-23 MED ORDER — ALPRAZOLAM 0.5 MG PO TABS
2.0000 mg | ORAL_TABLET | Freq: Two times a day (BID) | ORAL | Status: DC
  Administered 2014-07-23 – 2014-07-24 (×3): 2 mg via ORAL
  Filled 2014-07-23 (×3): qty 4

## 2014-07-23 MED ORDER — LURASIDONE HCL 60 MG PO TABS
60.0000 mg | ORAL_TABLET | Freq: Every day | ORAL | Status: DC
  Filled 2014-07-23 (×2): qty 1

## 2014-07-23 MED ORDER — RISPERIDONE 0.5 MG PO TABS
2.0000 mg | ORAL_TABLET | Freq: Two times a day (BID) | ORAL | Status: DC
  Administered 2014-07-23 – 2014-07-24 (×3): 2 mg via ORAL
  Filled 2014-07-23 (×3): qty 4

## 2014-07-23 NOTE — Progress Notes (Signed)
Pt clinicals faxed to [facility] for inpatient placement.  Oscar Burke, MSW Social Worker [phone] 

## 2014-07-23 NOTE — ED Notes (Addendum)
Pt girlfriend Amalia HaileyJudi Korb wants behavioral health to use the house phone number of [phone], when contacting her.

## 2014-07-23 NOTE — Progress Notes (Signed)
[facility] contacted the following psychiatric facilities for placement:  [facility] 2)[facility] 3)[facility] 4)[facility] 5)[facility] 6)[facility] 7)[facility] 8)[facility] 10)[facility] 11)[facility] 12)[facility]  FAX REFERRALS 1)[facility] 2)[facility] 3)[facility]  Blain PaisMichelle L Dewayne Severe, [facility]/NS

## 2014-07-23 NOTE — BH Assessment (Signed)
[facility] Assessment Progress Note  At 17:19 I received a call [facility].  She reports that the referral that she received for this pt was incomplete, and that at this time they no longer have beds available.  Doylene Canninghomas Niamh Rada, MA Triage Specialist 07/23/2014 @ 18:32

## 2014-07-23 NOTE — ED Notes (Signed)
Received report from HephzibahKeshia, Charity fundraiserN.

## 2014-07-24 DIAGNOSIS — F1994 Other psychoactive substance use, unspecified with psychoactive substance-induced mood disorder: Secondary | ICD-10-CM

## 2014-07-24 DIAGNOSIS — F14929 Cocaine use, unspecified with intoxication, unspecified: Secondary | ICD-10-CM

## 2014-07-24 DIAGNOSIS — F3160 Bipolar disorder, current episode mixed, unspecified: Secondary | ICD-10-CM

## 2014-07-24 MED ORDER — RISPERIDONE 2 MG PO TABS: 2.0000 mg | ORAL_TABLET | Freq: Every day | ORAL | Status: DC

## 2014-07-24 NOTE — ED Provider Notes (Signed)
Patient seen and evaluated by psychiatry. Medically cleared to be treated outpatient. They [facility] recommendation for substance abuse. Continue Risperdal. Prescription given for 5 days Risperdal.  Rolland PorterMark Arie Gable, MD 07/24/14 1241

## 2014-07-24 NOTE — ED Notes (Signed)
PT STATES HE IS NOT WILLING TO RE-START LATUDA D/T SIDE EFFECTS.

## 2014-07-24 NOTE — ED Notes (Signed)
Pt signed consent to release information to his girlfriend, Amalia HaileyJudi Korb

## 2014-07-24 NOTE — ED Notes (Addendum)
Dr Julien GirtJonnalangada in w/pt. Aware pt refusing to restart Latuda.

## 2014-07-24 NOTE — ED Notes (Signed)
SPOKE W/PT'S GIRLFIEND, JUDI. ADVISED HER PT HAD REQUESTED RN TO CALL AND ADVISE HER NOT TO VISIT. PT HAD ADVISED SHE IS ON HER WAY. JUDI SAID SHE WAS NOT COMING TO SEE PT. SHE WANTS HIM TO RECEIVE "THE HELP HE NEEDS". STATES HE WAS ON LATUDA WHICH HELPS HIM BUT REFUSING TO TAKE MEDS. STATES SHE IS WILLING TO LET PT COME HOME BUT WOULD RATHER FOR HIM TO BE ADMITTED TO PSYCH FACILITY FIRST. SHE ADVISED PT CAN BE MANIPULATIVE.
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
## 2017-03-31 ENCOUNTER — Emergency Department (HOSPITAL_COMMUNITY): Payer: Self-pay

## 2017-03-31 ENCOUNTER — Emergency Department (HOSPITAL_COMMUNITY)
Admission: EM | Admit: 2017-03-31 | Discharge: 2017-04-01 | Disposition: A | Discharge status: Home / Self Care | Payer: Self-pay | Attending: Emergency Medicine | Admitting: Emergency Medicine

## 2017-03-31 ENCOUNTER — Encounter (HOSPITAL_COMMUNITY): Payer: Self-pay | Admitting: Emergency Medicine

## 2017-03-31 DIAGNOSIS — R634 Abnormal weight loss: Secondary | ICD-10-CM | POA: Insufficient documentation

## 2017-03-31 DIAGNOSIS — Z79899 Other long term (current) drug therapy: Secondary | ICD-10-CM | POA: Insufficient documentation

## 2017-03-31 DIAGNOSIS — F17210 Nicotine dependence, cigarettes, uncomplicated: Secondary | ICD-10-CM | POA: Insufficient documentation

## 2017-03-31 DIAGNOSIS — M546 Pain in thoracic spine: Secondary | ICD-10-CM | POA: Insufficient documentation

## 2017-03-31 LAB — COMPREHENSIVE METABOLIC PANEL
ALK PHOS: 50 U/L (ref 38–126)
ALT: 38 U/L (ref 17–63)
AST: 34 U/L (ref 15–41)
Albumin: 3.9 g/dL (ref 3.5–5.0)
Anion gap: 6 (ref 5–15)
BUN: 9 mg/dL (ref 6–20)
CALCIUM: 9.2 mg/dL (ref 8.9–10.3)
CO2: 31 mmol/L (ref 22–32)
CREATININE: 0.91 mg/dL (ref 0.61–1.24)
Chloride: 103 mmol/L (ref 101–111)
GFR calc non Af Amer: >60 mL/min (ref >60)
GLUCOSE: 106 mg/dL — AB (ref 65–99)
Potassium: 4 mmol/L (ref 3.5–5.1)
SODIUM: 140 mmol/L (ref 135–145)
Total Bilirubin: 0.7 mg/dL (ref 0.3–1.2)
Total Protein: 6.7 g/dL (ref 6.5–8.1)

## 2017-03-31 LAB — CBC WITH DIFFERENTIAL/PLATELET
BASOS ABS: 0 10*3/uL (ref 0.0–0.1)
Basophils Relative: 0 %
EOS ABS: 0.1 10*3/uL (ref 0.0–0.7)
Eosinophils Relative: 2 %
HCT: 47.4 % (ref 39.0–52.0)
Hemoglobin: 16.1 g/dL (ref 13.0–17.0)
LYMPHS ABS: 2.9 10*3/uL (ref 0.7–4.0)
Lymphocytes Relative: 51 %
MCH: 31 pg (ref 26.0–34.0)
MCHC: 34 g/dL (ref 30.0–36.0)
MCV: 91.2 fL (ref 78.0–100.0)
Monocytes Absolute: 0.4 10*3/uL (ref 0.1–1.0)
Monocytes Relative: 8 %
Neutro Abs: 2.3 10*3/uL (ref 1.7–7.7)
Neutrophils Relative %: 39 %
Platelets: 133 10*3/uL — ABNORMAL LOW (ref 150–400)
RBC: 5.2 MIL/uL (ref 4.22–5.81)
RDW: 12.6 % (ref 11.5–15.5)
WBC: 5.7 10*3/uL (ref 4.0–10.5)

## 2017-03-31 MED ORDER — IOPAMIDOL (ISOVUE-300) INJECTION 61%
75.0000 mL | Freq: Once | INTRAVENOUS | Status: AC | PRN
  Administered 2017-03-31: 75 mL via INTRAVENOUS

## 2017-03-31 MED ORDER — IBUPROFEN 200 MG PO TABS
600.0000 mg | ORAL_TABLET | Freq: Once | ORAL | Status: AC
  Administered 2017-03-31: 600 mg via ORAL
  Filled 2017-03-31: qty 3

## 2017-03-31 NOTE — ED Notes (Signed)
Pt had drawn in lab" Blue Gold Lavender Lt green Dark green x2

## 2017-03-31 NOTE — ED Provider Notes (Signed)
[facility]-EMERGENCY DEPT Provider Note   CSN: [id number] Arrival date & time: 03/31/17  1654     History   Chief Complaint Chief Complaint  Patient presents with  . Back Pain    HPI Oscar Burke is a 36 y.o. male with a history of bipolar 1, polysubstance abuse, who presents today with 2 weeks of gradually worsening left posterior thoracic pain. He denies any recent trauma, says that it has been gradually getting worse. He has intermittently attempted ibuprofen and Flexeril for pain relief, however says that they are not helping.  He denies any fevers or chills, however does report an approximately 10 pound weight loss over the past 2 weeks.  He states that he has recently obtained a job where he is more active and says that he is not eating as much as he forgets to take breaks. He denies any night sweats, no hematemesis.  He denies recent drug use or injections, stating he has been clean for 2 years.   HPI  Past Medical History:  Diagnosis Date  . Bipolar 1 disorder (HCC)   . Chronic back pain   . Polysubstance abuse   . Smoker     Patient Active Problem List   Diagnosis Date Noted  . Alcohol dependence with acute intoxication, continuous (HCC)   . Overdose of heroin 12/13/2013  . Encephalopathy acute 12/13/2013  . Acute respiratory failure (HCC) 12/13/2013    History reviewed. No pertinent surgical history.     Home Medications    Prior to Admission medications   Medication Sig Start Date End Date Taking? Authorizing Provider  buPROPion (WELLBUTRIN XL) 150 MG 24 hr tablet Take 150 mg by mouth daily.   Yes [provider]  ibuprofen (ADVIL,MOTRIN) 200 MG tablet Take 800 mg by mouth every 6 (six) hours as needed for headache or mild pain.   Yes [provider]  meloxicam (MOBIC) 7.5 MG tablet Take 1 tablet (7.5 mg total) by mouth daily. 04/01/17   Cristina GongHammond, Liana Camerer W, PA-C  risperiDONE (RISPERDAL) 2 MG tablet Take 1 tablet (2 mg total) by mouth at  bedtime. Patient not taking: Reported on 03/31/2017 07/24/14   Rolland PorterJames, Mark, MD    Family History No family history on file.  Social History Social History  Substance Use Topics  . Smoking status: Current Every Day Smoker  . Smokeless tobacco: Not on file  . Alcohol use Yes     Allergies   Patient has no known allergies.   Review of Systems Review of Systems  Constitutional: Positive for unexpected weight change. Negative for chills, diaphoresis, fatigue and fever.  Respiratory: Negative for cough, chest tightness and shortness of breath.   Cardiovascular: Negative for chest pain.  Gastrointestinal: Negative for abdominal pain and nausea.  Musculoskeletal: Positive for arthralgias (Left thoracic back pain). Negative for myalgias.  Skin: Negative for color change and rash.  Neurological: Negative for light-headedness and headaches.  All other systems reviewed and are negative.    Physical Exam Updated Vital Signs BP 111/68 (BP Location: Right Arm)   Pulse 77   Temp 98 F (36.7 C) (Oral)   Resp 12   SpO2 100%   Physical Exam  Constitutional: He is oriented to person, place, and time. He appears well-developed and well-nourished. No distress.  HENT:  Head: Normocephalic and atraumatic.  Eyes: Conjunctivae are normal. Right eye exhibits no discharge. Left eye exhibits no discharge. No scleral icterus.  Neck: Normal range of motion. Neck supple.  Cardiovascular: Normal  rate, regular rhythm, normal heart sounds and intact distal pulses.   No murmur heard. Pulmonary/Chest: Effort normal and breath sounds normal. No stridor. No respiratory distress. He has no wheezes.  Abdominal: Soft. He exhibits no distension. There is no tenderness.  Musculoskeletal: He exhibits no edema or deformity.  TTP approximately 2 cm inferior to the inferior angle of the scapula.  No obvious masses, edema, erythema, or bruising.  Neurological: He is alert and oriented to person, place, and time.  He exhibits normal muscle tone.  Skin: Skin is warm and dry. He is not diaphoretic.  Psychiatric: He has a normal mood and affect. His behavior is normal.  Nursing note and vitals reviewed.    ED Treatments / Results  Labs (all labs ordered are listed, but only abnormal results are displayed) Labs Reviewed  CBC WITH DIFFERENTIAL/PLATELET - Abnormal; Notable for the following:       Result Value   Platelets 133 (*)    All other components within normal limits  COMPREHENSIVE METABOLIC PANEL - Abnormal; Notable for the following:    Glucose, Bld 106 (*)    All other components within normal limits    EKG  EKG Interpretation None       Radiology Dg Chest 2 View  Result Date: 03/31/2017 CLINICAL DATA:  Chest pressure for 2 weeks EXAM: CHEST  2 VIEW COMPARISON:  12/18/2013 FINDINGS: The heart size and mediastinal contours are within normal limits. Both lungs are clear. The visualized skeletal structures are unremarkable. IMPRESSION: No active cardiopulmonary disease. Electronically Signed   By: Alcide CleverMark  Lukens M.D.   On: 03/31/2017 21:20   Ct Chest W Contrast  Result Date: 03/31/2017 CLINICAL DATA:  Left-sided back pain for 2 weeks, nontraumatic. EXAM: CT CHEST WITH CONTRAST TECHNIQUE: Multidetector CT imaging of the chest was performed during intravenous contrast administration. CONTRAST:  75mL ISOVUE-300 IOPAMIDOL (ISOVUE-300) INJECTION 61% COMPARISON:  Radiographs 03/31/2017 FINDINGS: Cardiovascular: No significant vascular findings. Normal heart size. No pericardial effusion. Mediastinum/Nodes: No enlarged mediastinal, hilar, or axillary lymph nodes. Thyroid gland, trachea, and esophagus demonstrate no significant findings. Lungs/Pleura: Lungs are clear. No pleural effusion or pneumothorax. Upper Abdomen: No acute abnormality. Musculoskeletal: No chest wall abnormality. No acute or significant osseous findings. IMPRESSION: No significant abnormality. Electronically Signed   By: Ellery Plunkaniel R  Mitchell M.D.   On: 03/31/2017 23:42    Procedures Procedures (including critical care time)  Medications Ordered in ED Medications  ibuprofen (ADVIL,MOTRIN) tablet 600 mg (600 mg Oral Given 03/31/17 2138)  iopamidol (ISOVUE-300) 61 % injection 75 mL (75 mLs Intravenous Contrast Given 03/31/17 2319)     Initial Impression / Assessment and Plan / ED Course  I have reviewed the triage vital signs and the nursing notes.  Pertinent labs & imaging results that were available during my care of the patient were reviewed by me and considered in my medical decision making (see chart for details).    Oscar Burke presents with reported localized area of left infrascapular thoracic back pain and 10 pound weight loss over 2 weeks without recent history of trauma. Based on 2 week history of pain and the location of pain have very low suspicion for cardiac cause.  He has no increased shortness of breath, PERC negative.  While he does have a remote history of IV drug use, he reports that he has not used for 2 years, and his pain and lack of fevers do not appear consistent with a epidural abscess.  CT scan of chest was  obtained and reviewed, without cause for his pain or weight loss found.  Basic screening labs were obtained and reviewed.  All results were discussed with the patient was given the option to ask questions, all of which were answered to the best of my abilities. Based on lack of trauma and history of opioid abuse I do not feel that opioid pain medication is appropriate or needed for this patient at this time. As he reports Motrin is no longer working and he is unable to take Tylenol due to his hep C I will give him a prescription for meloxicam with instructions not to take additional NSAIDs and to monitor his stools for signs of blood.  I gave him the information for the wellness clinic for follow-up regarding his weight loss. Patient was given strict discharge precautions and states his  understanding. This patient was discussed with Dr. Rush Landmark who agrees with my plan. At this time there does not appear to be any evidence of an acute emergency medical condition, patient hemodynamically stable, appears safe for discharge at this time.   Final Clinical Impressions(s) / ED Diagnoses   Final diagnoses:  Acute left-sided thoracic back pain  Weight loss    New Prescriptions Discharge Medication List as of 04/01/2017 12:28 AM    START taking these medications   Details  meloxicam (MOBIC) 7.5 MG tablet Take 1 tablet (7.5 mg total) by mouth daily., Starting Thu 04/01/2017, Print         Cristina Gong, PA-C 04/01/17 0121    Tegeler, Canary Brim, MD 04/01/17 [phone]

## 2017-03-31 NOTE — ED Triage Notes (Signed)
Pt verbalizes ongoing left back pain for 2 weeks without associated n/v/d or GU symptoms; denies injury. Pain relieved with motrin at first but not anymore.

## 2017-03-31 NOTE — ED Notes (Signed)
Patient transported to CT 

## 2017-04-01 MED ORDER — MELOXICAM 7.5 MG PO TABS: 7.5000 mg | ORAL_TABLET | Freq: Every day | ORAL | 0 refills | Status: DC

## 2017-04-01 NOTE — Discharge Instructions (Signed)
Today your CT scans and x-ray did not show any cause for your pain or weight loss today.  If you take Mobic then do not take ibuprofen with it. For ibuprofen you may take 600 mg (3 pills) every 8 hours as needed. Please take it with food to decrease stomach upset. Please do not drink while taking ibuprofen.

## 2017-04-01 NOTE — ED Notes (Signed)
Pt ambulatory and independent at discharge.  Verbalized understanding of discharge instructions 

## 2017-04-07 ENCOUNTER — Emergency Department (HOSPITAL_COMMUNITY): Payer: Self-pay

## 2017-04-07 ENCOUNTER — Observation Stay (HOSPITAL_COMMUNITY)
Admission: EM | Admit: 2017-04-07 | Discharge: 2017-04-08 | Discharge status: Against Medical Advice | Payer: Self-pay | Attending: Family Medicine | Admitting: Family Medicine

## 2017-04-07 ENCOUNTER — Encounter (HOSPITAL_COMMUNITY): Payer: Self-pay | Admitting: *Deleted

## 2017-04-07 DIAGNOSIS — D72829 Elevated white blood cell count, unspecified: Secondary | ICD-10-CM

## 2017-04-07 DIAGNOSIS — Y929 Unspecified place or not applicable: Secondary | ICD-10-CM | POA: Insufficient documentation

## 2017-04-07 DIAGNOSIS — F1910 Other psychoactive substance abuse, uncomplicated: Secondary | ICD-10-CM

## 2017-04-07 DIAGNOSIS — F1029 Alcohol dependence with unspecified alcohol-induced disorder: Secondary | ICD-10-CM

## 2017-04-07 DIAGNOSIS — F10229 Alcohol dependence with intoxication, unspecified: Secondary | ICD-10-CM | POA: Insufficient documentation

## 2017-04-07 DIAGNOSIS — Z79899 Other long term (current) drug therapy: Secondary | ICD-10-CM | POA: Insufficient documentation

## 2017-04-07 DIAGNOSIS — R092 Respiratory arrest: Secondary | ICD-10-CM | POA: Insufficient documentation

## 2017-04-07 DIAGNOSIS — R400 Somnolence: Secondary | ICD-10-CM | POA: Insufficient documentation

## 2017-04-07 DIAGNOSIS — M549 Dorsalgia, unspecified: Secondary | ICD-10-CM | POA: Insufficient documentation

## 2017-04-07 DIAGNOSIS — J9621 Acute and chronic respiratory failure with hypoxia: Secondary | ICD-10-CM | POA: Diagnosis present

## 2017-04-07 DIAGNOSIS — T40601A Poisoning by unspecified narcotics, accidental (unintentional), initial encounter: Secondary | ICD-10-CM

## 2017-04-07 DIAGNOSIS — F319 Bipolar disorder, unspecified: Secondary | ICD-10-CM | POA: Insufficient documentation

## 2017-04-07 DIAGNOSIS — J9601 Acute respiratory failure with hypoxia: Secondary | ICD-10-CM

## 2017-04-07 DIAGNOSIS — F1310 Sedative, hypnotic or anxiolytic abuse, uncomplicated: Secondary | ICD-10-CM | POA: Insufficient documentation

## 2017-04-07 DIAGNOSIS — T401X1A Poisoning by heroin, accidental (unintentional), initial encounter: Principal | ICD-10-CM | POA: Insufficient documentation

## 2017-04-07 DIAGNOSIS — G9340 Encephalopathy, unspecified: Secondary | ICD-10-CM | POA: Insufficient documentation

## 2017-04-07 DIAGNOSIS — F1510 Other stimulant abuse, uncomplicated: Secondary | ICD-10-CM | POA: Insufficient documentation

## 2017-04-07 DIAGNOSIS — G8929 Other chronic pain: Secondary | ICD-10-CM | POA: Insufficient documentation

## 2017-04-07 DIAGNOSIS — F17200 Nicotine dependence, unspecified, uncomplicated: Secondary | ICD-10-CM | POA: Insufficient documentation

## 2017-04-07 LAB — COMPREHENSIVE METABOLIC PANEL
ALBUMIN: 3.8 g/dL (ref 3.5–5.0)
ALK PHOS: 55 U/L (ref 38–126)
ALT: 34 U/L (ref 17–63)
AST: 45 U/L — AB (ref 15–41)
Anion gap: 6 (ref 5–15)
BILIRUBIN TOTAL: 0.7 mg/dL (ref 0.3–1.2)
BUN: 10 mg/dL (ref 6–20)
CO2: 30 mmol/L (ref 22–32)
CREATININE: 1.14 mg/dL (ref 0.61–1.24)
Calcium: 8.3 mg/dL — ABNORMAL LOW (ref 8.9–10.3)
Chloride: 103 mmol/L (ref 101–111)
GFR calc Af Amer: >60 mL/min (ref >60)
GFR calc non Af Amer: >60 mL/min (ref >60)
GLUCOSE: 92 mg/dL (ref 65–99)
Potassium: 4.2 mmol/L (ref 3.5–5.1)
Sodium: 139 mmol/L (ref 135–145)
TOTAL PROTEIN: 6.2 g/dL — AB (ref 6.5–8.1)

## 2017-04-07 LAB — RAPID URINE DRUG SCREEN, HOSP PERFORMED
AMPHETAMINES: POSITIVE — AB
BARBITURATES: NOT DETECTED
Benzodiazepines: POSITIVE — AB
Cocaine: NOT DETECTED
OPIATES: POSITIVE — AB
TETRAHYDROCANNABINOL: NOT DETECTED

## 2017-04-07 LAB — URINALYSIS, ROUTINE W REFLEX MICROSCOPIC
BILIRUBIN URINE: NEGATIVE
Glucose, UA: 50 mg/dL — AB
Hgb urine dipstick: NEGATIVE
KETONES UR: NEGATIVE mg/dL
Leukocytes, UA: NEGATIVE
NITRITE: NEGATIVE
Protein, ur: NEGATIVE mg/dL
Specific Gravity, Urine: 1.01 (ref 1.005–1.030)
pH: 6 (ref 5.0–8.0)

## 2017-04-07 LAB — CBC WITH DIFFERENTIAL/PLATELET
BASOS PCT: 0 %
Basophils Absolute: 0 10*3/uL (ref 0.0–0.1)
EOS ABS: 0 10*3/uL (ref 0.0–0.7)
Eosinophils Relative: 0 %
HCT: 43.5 % (ref 39.0–52.0)
HEMOGLOBIN: 14.8 g/dL (ref 13.0–17.0)
Lymphocytes Relative: 7 %
Lymphs Abs: 0.9 10*3/uL (ref 0.7–4.0)
MCH: 31 pg (ref 26.0–34.0)
MCHC: 34 g/dL (ref 30.0–36.0)
MCV: 91 fL (ref 78.0–100.0)
MONO ABS: 1 10*3/uL (ref 0.1–1.0)
MONOS PCT: 7 %
NEUTROS PCT: 86 %
Neutro Abs: 11.7 10*3/uL — ABNORMAL HIGH (ref 1.7–7.7)
Platelets: 171 10*3/uL (ref 150–400)
RBC: 4.78 MIL/uL (ref 4.22–5.81)
RDW: 12.4 % (ref 11.5–15.5)
WBC: 13.7 10*3/uL — ABNORMAL HIGH (ref 4.0–10.5)

## 2017-04-07 LAB — ACETAMINOPHEN LEVEL: Acetaminophen (Tylenol), Serum: <10 ug/mL — ABNORMAL LOW (ref 10–30)

## 2017-04-07 LAB — CBG MONITORING, ED: Glucose-Capillary: 167 mg/dL — ABNORMAL HIGH (ref 65–99)

## 2017-04-07 LAB — ETHANOL: Alcohol, Ethyl (B): <5 mg/dL (ref <5)

## 2017-04-07 LAB — SALICYLATE LEVEL: Salicylate Lvl: <7 mg/dL (ref 2.8–30.0)

## 2017-04-07 MED ORDER — MELOXICAM 7.5 MG PO TABS: 7.5000 mg | ORAL_TABLET | Freq: Every day | ORAL | Status: DC

## 2017-04-07 MED ORDER — NALOXONE HCL 2 MG/2ML IJ SOSY
1.0000 mg/h | PREFILLED_SYRINGE | INTRAVENOUS | Status: DC
  Filled 2017-04-07: qty 4

## 2017-04-07 MED ORDER — SODIUM CHLORIDE 0.9 % IV SOLN: INTRAVENOUS | Status: DC

## 2017-04-07 MED ORDER — ONDANSETRON HCL 4 MG/2ML IJ SOLN
INTRAMUSCULAR | Status: AC
  Administered 2017-04-07: 4 mg via INTRAVENOUS
  Filled 2017-04-07: qty 2

## 2017-04-07 MED ORDER — NALOXONE HCL 2 MG/2ML IJ SOSY
PREFILLED_SYRINGE | INTRAMUSCULAR | Status: AC
  Administered 2017-04-07: 2 mg via INTRAVENOUS
  Filled 2017-04-07: qty 2

## 2017-04-07 MED ORDER — BUPROPION HCL ER (XL) 150 MG PO TB24
150.0000 mg | ORAL_TABLET | Freq: Every day | ORAL | Status: DC
Start: 2017-04-08 — End: 2017-04-08

## 2017-04-07 MED ORDER — ONDANSETRON HCL 4 MG/2ML IJ SOLN
4.0000 mg | Freq: Once | INTRAMUSCULAR | Status: AC
  Administered 2017-04-07: 4 mg via INTRAVENOUS

## 2017-04-07 MED ORDER — SODIUM CHLORIDE 0.9% FLUSH: 3.0000 mL | Freq: Two times a day (BID) | INTRAVENOUS | Status: DC

## 2017-04-07 MED ORDER — FOLIC ACID 5 MG/ML IJ SOLN: 1.0000 mg | Freq: Every day | INTRAMUSCULAR | Status: DC

## 2017-04-07 MED ORDER — NALOXONE HCL 0.4 MG/ML IJ SOLN: 0.4000 mg | INTRAMUSCULAR | Status: DC | PRN

## 2017-04-07 MED ORDER — LORAZEPAM 2 MG/ML IJ SOLN: 2.0000 mg | INTRAMUSCULAR | Status: DC | PRN

## 2017-04-07 MED ORDER — BISACODYL 5 MG PO TBEC: 5.0000 mg | DELAYED_RELEASE_TABLET | Freq: Every day | ORAL | Status: DC | PRN

## 2017-04-07 MED ORDER — THIAMINE HCL 100 MG/ML IJ SOLN: 100.0000 mg | Freq: Every day | INTRAMUSCULAR | Status: DC

## 2017-04-07 MED ORDER — ONDANSETRON HCL 4 MG PO TABS
4.0000 mg | ORAL_TABLET | Freq: Four times a day (QID) | ORAL | Status: DC | PRN
Start: 2017-04-07 — End: 2017-04-08

## 2017-04-07 MED ORDER — ONDANSETRON HCL 4 MG/2ML IJ SOLN: 4.0000 mg | Freq: Four times a day (QID) | INTRAMUSCULAR | Status: DC | PRN

## 2017-04-07 MED ORDER — SENNOSIDES-DOCUSATE SODIUM 8.6-50 MG PO TABS: 1.0000 | ORAL_TABLET | Freq: Every evening | ORAL | Status: DC | PRN

## 2017-04-07 MED ORDER — IBUPROFEN 200 MG PO TABS: 600.0000 mg | ORAL_TABLET | Freq: Four times a day (QID) | ORAL | Status: DC | PRN

## 2017-04-07 MED ORDER — NALOXONE HCL 2 MG/2ML IJ SOSY
2.0000 mg | PREFILLED_SYRINGE | Freq: Once | INTRAMUSCULAR | Status: AC
  Administered 2017-04-07: 2 mg via INTRAVENOUS

## 2017-04-07 MED ORDER — SODIUM CHLORIDE 0.9 % IV BOLUS (SEPSIS)
1000.0000 mL | Freq: Once | INTRAVENOUS | Status: AC
  Administered 2017-04-07: 1000 mL via INTRAVENOUS

## 2017-04-07 MED ORDER — ENOXAPARIN SODIUM 40 MG/0.4ML ~~LOC~~ SOLN: 40.0000 mg | SUBCUTANEOUS | Status: DC

## 2017-04-07 NOTE — ED Notes (Signed)
Pt has urinal, just waiting for pt to urinate

## 2017-04-07 NOTE — ED Provider Notes (Signed)
[facility]-EMERGENCY DEPT Provider Note   CSN: [id number] Arrival date & time: 04/07/17  1846     History   Chief Complaint Chief Complaint  Patient presents with  . Drug Overdose    HPI Oscar Burke is a 36 y.o. male.  Pt presents to the ED today with a drug OD.  Someone called 911 and said pt overdosed.  When EMS arrived, he was not breathing and multiple syringes were on the ground.  Pt initially given 2 mg IN, and when an IV was established, he was given an additional 2 mg IV.  He woke up after the IV dose, but has become more somnolent.  Pt is unable to give any hx.      Past Medical History:  Diagnosis Date  . Bipolar 1 disorder (HCC)   . Chronic back pain   . Polysubstance abuse   . Smoker     Patient Active Problem List   Diagnosis Date Noted  . Alcohol dependence with acute intoxication, continuous (HCC)   . Overdose of heroin 12/13/2013  . Encephalopathy acute 12/13/2013  . Acute respiratory failure (HCC) 12/13/2013    History reviewed. No pertinent surgical history.     Home Medications    Prior to Admission medications   Medication Sig Start Date End Date Taking? Authorizing Provider  buPROPion (WELLBUTRIN XL) 150 MG 24 hr tablet Take 150 mg by mouth daily.    [provider]  ibuprofen (ADVIL,MOTRIN) 200 MG tablet Take 800 mg by mouth every 6 (six) hours as needed for headache or mild pain.    [provider]  meloxicam (MOBIC) 7.5 MG tablet Take 1 tablet (7.5 mg total) by mouth daily. 04/01/17   Cristina Gong, PA-C  risperiDONE (RISPERDAL) 2 MG tablet Take 1 tablet (2 mg total) by mouth at bedtime. Patient not taking: Reported on 03/31/2017 07/24/14   Rolland Porter, MD    Family History No family history on file.  Social History Social History  Substance Use Topics  . Smoking status: Current Every Day Smoker  . Smokeless tobacco: Not on file  . Alcohol use Yes     Allergies   Patient has no known  allergies.   Review of Systems Review of Systems  Unable to perform ROS: Mental status change     Physical Exam Updated Vital Signs BP 111/69   Pulse 75   Temp 97.6 F (36.4 C) (Oral)   Resp 18   SpO2 100%   Physical Exam  Constitutional: He appears well-developed and well-nourished. He appears lethargic. He appears distressed.  HENT:  Head: Normocephalic and atraumatic.  Right Ear: External ear normal.  Left Ear: External ear normal.  Nose: Nose normal.  Mouth/Throat: Oropharynx is clear and moist.  Eyes: Pupils are equal, round, and reactive to light. Conjunctivae and EOM are normal.  Neck: Normal range of motion. Neck supple.  Cardiovascular: Normal rate, regular rhythm, normal heart sounds and intact distal pulses.   Pulmonary/Chest: Breath sounds normal. Bradypnea noted. He is in respiratory distress.  Abdominal: Soft. Bowel sounds are normal.  Musculoskeletal: Normal range of motion.  Neurological: He appears lethargic.  Skin: Skin is warm.  Nursing note and vitals reviewed.    ED Treatments / Results  Labs (all labs ordered are listed, but only abnormal results are displayed) Labs Reviewed  COMPREHENSIVE METABOLIC PANEL - Abnormal; Notable for the following:       Result Value   Calcium 8.3 (*)    Total Protein  6.2 (*)    AST 45 (*)    All other components within normal limits  ACETAMINOPHEN LEVEL - Abnormal; Notable for the following:    Acetaminophen (Tylenol), Serum <10 (*)    All other components within normal limits  RAPID URINE DRUG SCREEN, HOSP PERFORMED - Abnormal; Notable for the following:    Opiates POSITIVE (*)    Benzodiazepines POSITIVE (*)    Amphetamines POSITIVE (*)    All other components within normal limits  CBC WITH DIFFERENTIAL/PLATELET - Abnormal; Notable for the following:    WBC 13.7 (*)    Neutro Abs 11.7 (*)    All other components within normal limits  URINALYSIS, ROUTINE W REFLEX MICROSCOPIC - Abnormal; Notable for the  following:    Glucose, UA 50 (*)    All other components within normal limits  CBG MONITORING, ED - Abnormal; Notable for the following:    Glucose-Capillary 167 (*)    All other components within normal limits  SALICYLATE LEVEL  ETHANOL    EKG  EKG Interpretation  Date/Time:  Wednesday April 07 2017 18:57:21 EDT Ventricular Rate:  96 PR Interval:    QRS Duration: 109 QT Interval:  373 QTC Calculation: 472 R Axis:   80 Text Interpretation:  Sinus rhythm Confirmed by Jacalyn LefevreHaviland, Yoshika Vensel [phone]([id number]) on 04/07/2017 7:08:42 PM       Radiology Dg Chest Port 1 View  Result Date: 04/07/2017 CLINICAL DATA:  Overdose with subsequent respiratory arrest. Smoker. EXAM: PORTABLE CHEST 1 VIEW COMPARISON:  Chest radiographs and chest CT dated 03/31/2017. FINDINGS: The heart size and mediastinal contours are within normal limits. Both lungs are clear. The visualized skeletal structures are unremarkable. IMPRESSION: Normal examination. Electronically Signed   By: Beckie SaltsSteven  Reid M.D.   On: 04/07/2017 19:50    Procedures Procedures (including critical care time)  Medications Ordered in ED Medications  sodium chloride 0.9 % bolus 1,000 mL (1,000 mLs Intravenous New Bag/Given 04/07/17 1930)    And  0.9 %  sodium chloride infusion (not administered)  naloxone (NARCAN) 4 mg in dextrose 5 % 250 mL infusion (1 mg/hr Intravenous Not Given 04/07/17 2105)  naloxone [facility](NARCAN) injection 2 mg (2 mg Intravenous Given 04/07/17 1930)  ondansetron (ZOFRAN) injection 4 mg (4 mg Intravenous Given 04/07/17 1930)     Initial Impression / Assessment and Plan / ED Course  I have reviewed the triage vital signs and the nursing notes.  Pertinent labs & imaging results that were available during my care of the patient were reviewed by me and considered in my medical decision making (see chart for details).  Pt given an additional 2 more mg of narcan when he arrived.  Initially, it did not seem to help, so I ordered a narcan drip.   However, it did kick in and pt did not need the drip.  Pt still requiring oxygen.  He is still very somnolent.  He will wake up briefly, but will go back to sleep.  Pt d/w Dr. Antionette Charpyd for admission.    CRITICAL CARE Performed by: Jacalyn LefevreJulie Choua Chalker   Total critical care time: 30 minutes  Critical care time was exclusive of separately billable procedures and treating other patients.  Critical care was necessary to treat or prevent imminent or life-threatening deterioration.  Critical care was time spent personally by me on the following activities: development of treatment plan with patient and/or surrogate as well as nursing, discussions with consultants, evaluation of patient's response to treatment, examination of patient, obtaining history  from patient or surrogate, ordering and performing treatments and interventions, ordering and review of laboratory studies, ordering and review of radiographic studies, pulse oximetry and re-evaluation of patient's condition.   Final Clinical Impressions(s) / ED Diagnoses   Final diagnoses:  Opiate overdose, accidental or unintentional, initial encounter  Polysubstance abuse  Acute respiratory failure with hypoxia [facility](HCC)    New Prescriptions New Prescriptions   No medications on file     Jacalyn LefevreHaviland, Myana Schlup, MD 04/07/17 2258

## 2017-04-07 NOTE — ED Notes (Signed)
Bed: RESB Expected date:  Expected time:  Means of arrival:  Comments: Overdose, narcan given

## 2017-04-07 NOTE — H&P (Signed)
History and Physical    Oscar Burke DOB: 06-Feb-1981 DOA: 04/07/2017  PCP: Patient, No Pcp Per   Patient coming from: Home   Chief Complaint: Found down with respiratory arrest   HPI: Oscar Burke is a 36 y.o. male with medical history significant for polysubstance abuse, brought into the ED after being found unresponsive and apneic. Someone called 911 to report an overdose and police department arrived first, finding the patient unresponsive and apneic with syringes next to him. Narcan was administered intranasally with no significant effect. Paramedics arrived, started IV, gave 2 mg IV Narcan. Patient then began to breathe spontaneously, open his eyes, and began to make verbalizations, but remained incoherent. Pupils were reportedly pinpoint on the arrival of paramedics. The 911 caller was no longer on the scene when EMS arrived.  ED Course: Upon arrival to the ED, patient is found to be afebrile, saturating low 90s on room air, and with vitals otherwise stable. EKG features a normal sinus rhythm, chest x-ray is unremarkable, chemistry panel was essentially normal, and CBC features a leukocytosis to 13,700. Urinalysis is normal, ethanol, salicylate, and acetaminophen levels are undetectable, and UDS is positive for amphetamines, benzodiazepines, and opiates. Patient was given a liter of normal saline in the ED. He became apneic and could not be roused. He was treated with 2 mg IV Narcan in the ED, with improvement in his spontaneous respirations. He has remained hemodynamically stable and will be observed on the stepdown unit for ongoing evaluation and management of acute respiratory failure suspected secondary to opiate overdose.  Review of Systems:  Unable to complete ROS secondary to the patient's clinical condition with somnolence.   Past Medical History:  Diagnosis Date  . Bipolar 1 disorder (HCC)   . Chronic back pain   . Polysubstance abuse   . Smoker      History reviewed. No pertinent surgical history.   reports that he has been smoking.  He does not have any smokeless tobacco history on file. He reports that he drinks alcohol. He reports that he uses drugs, including Cocaine and IV.  No Known Allergies  History reviewed. No pertinent family history.   Prior to Admission medications   Medication Sig Start Date End Date Taking? Authorizing Provider  buPROPion (WELLBUTRIN XL) 150 MG 24 hr tablet Take 150 mg by mouth daily.    Provider, Historical, Oscar Burke  ibuprofen (ADVIL,MOTRIN) 200 MG tablet Take 800 mg by mouth every 6 (six) hours as needed for headache or mild pain.    Provider, Historical, Oscar Burke  meloxicam (MOBIC) 7.5 MG tablet Take 1 tablet (7.5 mg total) by mouth daily. 04/01/17   Cristina GongHammond, Elizabeth W, PA-C  risperiDONE (RISPERDAL) 2 MG tablet Take 1 tablet (2 mg total) by mouth at bedtime. Patient not taking: Reported on 03/31/2017 07/24/14   Rolland PorterJames, Mark, Oscar Burke    Physical Exam: Vitals:   04/07/17 2130 04/07/17 2145 04/07/17 2200 04/07/17 2217  BP: 105/73 105/73 111/69 111/69  Pulse: 93 82 78 75  Resp: 13 13 12 18   Temp:      TempSrc:      SpO2: (!) 89% 96% 99% 100%      Constitutional: Somnolent, rouses briefly, no pallor, no diaphoresis.  Eyes: PERTLA, lids and conjunctivae normal ENMT: Mucous membranes are moist. Dried blood in nares. Posterior pharynx clear of any exudate or lesions.   Neck: normal, supple, no masses, no thyromegaly Respiratory: clear to auscultation bilaterally, no wheezing, no crackles. No accessory muscle use.  Cardiovascular: S1 & S2 heard, regular rate and rhythm. No extremity edema. No significant JVD. Abdomen: No distension, no tenderness, no masses palpated. Bowel sounds normal.  Musculoskeletal: no clubbing / cyanosis. No joint deformity upper and lower extremities.    Skin: no significant rashes, lesions, ulcers. Warm, dry, well-perfused. Neurologic: PERRL. No facial asymmetry. Moving all  extremities. Patellar DTR's normal.    Psychiatric: Somnolent, rouses. Confused.     Labs on Admission: I have personally reviewed following labs and imaging studies  CBC:  Recent Labs Lab 04/07/17 2020  WBC 13.7*  NEUTROABS 11.7*  HGB 14.8  HCT 43.5  MCV 91.0  PLT 171   Basic Metabolic Panel:  Recent Labs Lab 04/07/17 2020  NA 139  K 4.2  CL 103  CO2 30  GLUCOSE 92  BUN 10  CREATININE 1.14  CALCIUM 8.3*   GFR: CrCl cannot be calculated (Unknown ideal weight.). Liver Function Tests:  Recent Labs Lab 04/07/17 2020  AST 45*  ALT 34  ALKPHOS 55  BILITOT 0.7  PROT 6.2*  ALBUMIN 3.8   No results for input(s): LIPASE, AMYLASE in the last 168 hours. No results for input(s): AMMONIA in the last 168 hours. Coagulation Profile: No results for input(s): INR, PROTIME in the last 168 hours. Cardiac Enzymes: No results for input(s): CKTOTAL, CKMB, CKMBINDEX, TROPONINI in the last 168 hours. BNP (last 3 results) No results for input(s): PROBNP in the last 8760 hours. HbA1C: No results for input(s): HGBA1C in the last 72 hours. CBG:  Recent Labs Lab 04/07/17 1922  GLUCAP 167*   Lipid Profile: No results for input(s): CHOL, HDL, LDLCALC, TRIG, CHOLHDL, LDLDIRECT in the last 72 hours. Thyroid Function Tests: No results for input(s): TSH, T4TOTAL, FREET4, T3FREE, THYROIDAB in the last 72 hours. Anemia Panel: No results for input(s): VITAMINB12, FOLATE, FERRITIN, TIBC, IRON, RETICCTPCT in the last 72 hours. Urine analysis:    Component Value Date/Time   COLORURINE YELLOW 04/07/2017 1905   APPEARANCEUR CLEAR 04/07/2017 1905   LABSPEC 1.010 04/07/2017 1905   PHURINE 6.0 04/07/2017 1905   GLUCOSEU 50 (A) 04/07/2017 1905   HGBUR NEGATIVE 04/07/2017 1905   BILIRUBINUR NEGATIVE 04/07/2017 1905   KETONESUR NEGATIVE 04/07/2017 1905   PROTEINUR NEGATIVE 04/07/2017 1905   UROBILINOGEN 2.0 (H) 12/17/2013 0848   NITRITE NEGATIVE 04/07/2017 1905   LEUKOCYTESUR  NEGATIVE 04/07/2017 1905   Sepsis Labs: @LABRCNTIP (procalcitonin:4,lacticidven:4) )No results found for this or any previous visit (from the past 240 hour(s)).   Radiological Exams on Admission: Dg Chest Port 1 View  Result Date: 04/07/2017 CLINICAL DATA:  Overdose with subsequent respiratory arrest. Smoker. EXAM: PORTABLE CHEST 1 VIEW COMPARISON:  Chest radiographs and chest CT dated 03/31/2017. FINDINGS: The heart size and mediastinal contours are within normal limits. Both lungs are clear. The visualized skeletal structures are unremarkable. IMPRESSION: Normal examination. Electronically Signed   By: Beckie Salts M.D.   On: 04/07/2017 19:50    EKG: Independently reviewed. Sinus rhythm.   Assessment/Plan  1. Acute respiratory failure, opiate overdose  - Pt was found unresponsive and apneic with pinpoint pupils and with syringes  - He did not respond to intranasal Narcan, but then improved with 2 mg IV Narcan in the field  - He required another 2 mg IVP Narcan in ED for apnea  - At time of admission, he is somnolent, can be roused with gentle touch, breathing spontaneously at ~16/min, saturating mid 90's on 2 Lpm supplemental O2  - Lungs sound clear and CXR  normal  - Plan to monitor in SDU with prn supplemental O2, prn Narcan  - Social work has been consulted and is evaluating patient in ED, their involvement much appreciated   2. Polysubstance abuse - Pt has documented hx of alcohol dependence, UDS positive for amphetamines, opiates, and benzodiazepines  - Social work on the case and much appreciated  - Monitor with CIWA and prn Ativan    3. Leukocytosis  - WBC is 13,700 on admission without fever or apparent source of infection  - Plan to culture if febrile    DVT prophylaxis: sq Lovenox  Code Status: Full  Family Communication: Discussed with patient Disposition Plan: Observe in SDU Consults called: None Admission status: Observation    Oscar Deutscherimothy S Evren Shankland, Oscar Burke [facility] Pager [phone]  If 7PM-7AM, please contact night-coverage [email or web address] Password [email or web address]  04/07/2017, 10:59 PM

## 2017-04-07 NOTE — ED Notes (Signed)
Patient is going to sign out AMA after speaking with hospitalist-patient trying to find a ride home

## 2017-04-07 NOTE — ED Triage Notes (Signed)
Per EMS, police found pt unresponsive on floor after a male called 911. Pt was alone with several empty syringes on the floor, pt was in respiratory arrest upon EMS arrival.  Pt received 2mg  intranasal narcan. Pt given 2mg  narcan IV by EMS. Pt's respirations improved after IV narcan. Pt incoherent with EMS. Pt's pupils were initially pinpoint, became 5mm and reactive after narcan. Lung sounds clear.   BP 160/90 RR 18  HR 108 SpO2 99 on RA

## 2017-04-07 NOTE — ED Notes (Signed)
Pt is lethargic, aroused easily with verbal stimulation.  Pt asks "what is going on" each time pt is woken.  Pt denies use of any illicit drugs, he states "only the one the doctor gives me."  Syringes was found on scene.  Person who called 911 was not there when EMS arrived on scene.

## 2017-04-07 NOTE — ED Notes (Signed)
Patient is awake and alert/talking with Child psychotherapistsocial worker, called girlfriend and sponsor on the phone. Patient wants to leave, hospitalist made aware and in to talk with patient.

## 2017-04-07 NOTE — ED Notes (Signed)
Obtained UA via In & out cath, ankle monitor noted on his R ankle.

## 2017-04-07 NOTE — Progress Notes (Addendum)
Consult request has been received. CSW attempting to follow up at present time.  11:34 PM ED CSW attempted assesment but pt was not oriented x 4.  Pt had just overdosed on heroin per ED RN.  Pt did not recall using heroin or relapsing.  Pt stated he had been sober for some time. CSW assisted pt in contacting his significant other.  Please reconsult if future social work needs arise.  CSW signing off, as social work intervention is no longer needed.   Dorothe PeaJonathan F. Jihan Burke, Oscar SorLCSWA, LCAS, CSI Clinical Social Worker Ph: [phone]

## 2017-04-08 NOTE — ED Notes (Signed)
Pt is still attempting to call for a ride.

## 2017-06-27 ENCOUNTER — Emergency Department (HOSPITAL_COMMUNITY)
Admission: EM | Admit: 2017-06-27 | Discharge: 2017-06-27 | Disposition: A | Discharge status: Home / Self Care | Payer: Self-pay | Attending: Emergency Medicine | Admitting: Emergency Medicine

## 2017-06-27 ENCOUNTER — Encounter (HOSPITAL_COMMUNITY): Payer: Self-pay | Admitting: Emergency Medicine

## 2017-06-27 ENCOUNTER — Emergency Department (HOSPITAL_COMMUNITY): Payer: Self-pay

## 2017-06-27 DIAGNOSIS — F1910 Other psychoactive substance abuse, uncomplicated: Secondary | ICD-10-CM | POA: Insufficient documentation

## 2017-06-27 DIAGNOSIS — R61 Generalized hyperhidrosis: Secondary | ICD-10-CM | POA: Insufficient documentation

## 2017-06-27 DIAGNOSIS — L02411 Cutaneous abscess of right axilla: Secondary | ICD-10-CM | POA: Insufficient documentation

## 2017-06-27 DIAGNOSIS — R002 Palpitations: Secondary | ICD-10-CM | POA: Insufficient documentation

## 2017-06-27 DIAGNOSIS — R079 Chest pain, unspecified: Secondary | ICD-10-CM | POA: Insufficient documentation

## 2017-06-27 DIAGNOSIS — F17200 Nicotine dependence, unspecified, uncomplicated: Secondary | ICD-10-CM | POA: Insufficient documentation

## 2017-06-27 DIAGNOSIS — Z79899 Other long term (current) drug therapy: Secondary | ICD-10-CM | POA: Insufficient documentation

## 2017-06-27 DIAGNOSIS — R0602 Shortness of breath: Secondary | ICD-10-CM | POA: Insufficient documentation

## 2017-06-27 DIAGNOSIS — Z590 Homelessness: Secondary | ICD-10-CM | POA: Insufficient documentation

## 2017-06-27 LAB — COMPREHENSIVE METABOLIC PANEL
ALT: 24 U/L (ref 17–63)
AST: 24 U/L (ref 15–41)
Albumin: 3.5 g/dL (ref 3.5–5.0)
Alkaline Phosphatase: 54 U/L (ref 38–126)
Anion gap: 4 — ABNORMAL LOW (ref 5–15)
BUN: 9 mg/dL (ref 6–20)
CHLORIDE: 107 mmol/L (ref 101–111)
CO2: 25 mmol/L (ref 22–32)
CREATININE: 0.71 mg/dL (ref 0.61–1.24)
Calcium: 9 mg/dL (ref 8.9–10.3)
GFR calc non Af Amer: >60 mL/min (ref >60)
Glucose, Bld: 112 mg/dL — ABNORMAL HIGH (ref 65–99)
POTASSIUM: 4 mmol/L (ref 3.5–5.1)
SODIUM: 136 mmol/L (ref 135–145)
Total Bilirubin: 0.5 mg/dL (ref 0.3–1.2)
Total Protein: 6.7 g/dL (ref 6.5–8.1)

## 2017-06-27 LAB — CBC WITH DIFFERENTIAL/PLATELET
Basophils Absolute: 0 10*3/uL (ref 0.0–0.1)
Basophils Relative: 0 %
Eosinophils Absolute: 0.1 10*3/uL (ref 0.0–0.7)
Eosinophils Relative: 1 %
HEMATOCRIT: 46.5 % (ref 39.0–52.0)
HEMOGLOBIN: 15.2 g/dL (ref 13.0–17.0)
LYMPHS ABS: 1.4 10*3/uL (ref 0.7–4.0)
LYMPHS PCT: 18 %
MCH: 29.5 pg (ref 26.0–34.0)
MCHC: 32.7 g/dL (ref 30.0–36.0)
MCV: 90.3 fL (ref 78.0–100.0)
MONOS PCT: 8 %
Monocytes Absolute: 0.6 10*3/uL (ref 0.1–1.0)
NEUTROS ABS: 5.6 10*3/uL (ref 1.7–7.7)
NEUTROS PCT: 73 %
Platelets: 204 10*3/uL (ref 150–400)
RBC: 5.15 MIL/uL (ref 4.22–5.81)
RDW: 12.7 % (ref 11.5–15.5)
WBC: 7.6 10*3/uL (ref 4.0–10.5)

## 2017-06-27 MED ORDER — IBUPROFEN 400 MG PO TABS
600.0000 mg | ORAL_TABLET | Freq: Once | ORAL | Status: AC
  Administered 2017-06-27: 15:00:00 600 mg via ORAL
  Filled 2017-06-27: qty 1

## 2017-06-27 MED ORDER — LIDOCAINE-EPINEPHRINE (PF) 2 %-1:200000 IJ SOLN
10.0000 mL | Freq: Once | INTRAMUSCULAR | Status: AC
  Administered 2017-06-27: 10 mL via INTRADERMAL
  Filled 2017-06-27: qty 20

## 2017-06-27 NOTE — ED Triage Notes (Addendum)
Pt has abscess to right underarm, redness and swelling, for 2 days. Pt concerned because it is painful and states he had night sweats when he woke up. Afebrile at triage. Vitals stable. PT also has what looks like bug bites to abdomen (pt states "its not bug bites it is a rash") but it looks like bites, also two to left arm pit. Pt states he has has been living at the shelter for a few days and noticed the bumps since then. States he cleaned his cot and sheets so he does not think it is bed bugs.  Med history include biploar disorder, substance abuse, overdose. Denies SI.

## 2017-06-27 NOTE — ED Provider Notes (Signed)
[name]GENC[facility] Provider Note   CSN: [id number] Arrival date & time: 06/27/17  1100     History   Chief Complaint Chief Complaint  Patient presents with  . Abscess  . Generalized Body Aches  . bug bites?    HPI Oscar Burke is a 36 y.o. male who presents with an abscess. PMH significant for polysubstance abuse, chronic back pain, tobacco abuse. He states that over the past several days he has had multiple abscesses on his body. He has been cutting them open and squeezing them to drain them and some have them have resolved but the one under his right armpit is getting worse and the pain is radiating down his arm. He endorses active IVDU - he uses heroin and molly. He states he uses clean needles. He also has been staying at a homeless shelter because he broke up with his girlfriend and moved out. He denies fever but has had sweats. He also endorses chest pain and SOB at times as well as palpitations when he stands. He denies decreased oral intake. He also has noted several blisters on his body which have burst.   HPI  Past Medical History:  Diagnosis Date  . Bipolar 1 disorder (HCC)   . Chronic back pain   . Polysubstance abuse (HCC)   . Smoker     Patient Active Problem List   Diagnosis Date Noted  . Polysubstance abuse (HCC) 04/07/2017  . Leukocytosis 04/07/2017  . Alcohol dependence with unspecified alcohol-induced disorder (HCC)   . Opiate overdose (HCC) 12/13/2013  . Encephalopathy acute 12/13/2013  . Acute respiratory failure (HCC) 12/13/2013    No past surgical history on file.     Home Medications    Prior to Admission medications   Medication Sig Start Date End Date Taking? Authorizing Provider  buPROPion (WELLBUTRIN XL) 150 MG 24 hr tablet Take 150 mg by mouth daily.    [provider]  ibuprofen (ADVIL,MOTRIN) 200 MG tablet Take 800 mg by mouth every 6 (six) hours as needed for headache or mild pain.     [provider]  meloxicam (MOBIC) 7.5 MG tablet Take 1 tablet (7.5 mg total) by mouth daily. 04/01/17   Cristina GongHammond, Elizabeth W, PA-C  risperiDONE (RISPERDAL) 2 MG tablet Take 1 tablet (2 mg total) by mouth at bedtime. Patient not taking: Reported on 03/31/2017 07/24/14   Rolland PorterJames, Mark, MD    Family History No family history on file.  Social History Social History  Substance Use Topics  . Smoking status: Current Every Day Smoker  . Smokeless tobacco: Not on file  . Alcohol use Yes     Allergies   Patient has no known allergies.   Review of Systems Review of Systems  Constitutional: Positive for diaphoresis. Negative for appetite change, chills and fever.  Respiratory: Positive for shortness of breath. Negative for cough.   Cardiovascular: Positive for chest pain and palpitations. Negative for leg swelling.  Gastrointestinal: Positive for abdominal pain. Negative for diarrhea, nausea and vomiting.  Genitourinary: Negative for dysuria.  Musculoskeletal: Positive for back pain (chronic).  Skin:       +abscess +blisters  Allergic/Immunologic: Negative for immunocompromised state.  Neurological: Negative for syncope and light-headedness.  All other systems reviewed and are negative.    Physical Exam Updated Vital Signs BP 118/75   Pulse 72   Temp 98.2 F (36.8 C) (Oral)   Resp 16   Ht 5\' 10"  (1.778 m)   Wt  77.1 kg (170 lb)   SpO2 99%   BMI 24.39 kg/m   Physical Exam  Constitutional: He is oriented to person, place, and time. He appears well-developed and well-nourished. No distress.  Thin, calm, cooperative  HENT:  Head: Normocephalic and atraumatic.  Eyes: Pupils are equal, round, and reactive to light. Conjunctivae are normal. Right eye exhibits no discharge. Left eye exhibits no discharge. No scleral icterus.  Neck: Normal range of motion.  Cardiovascular: Normal rate and regular rhythm.  Exam reveals no gallop and no friction rub.   No murmur  heard. Pulmonary/Chest: Effort normal and breath sounds normal. No respiratory distress. He has no wheezes. He has no rales. He exhibits no tenderness.  Abdominal: Soft. Bowel sounds are normal. He exhibits no distension. There is no tenderness.  Several well healing wounds around belly button  Neurological: He is alert and oriented to person, place, and time.  Skin: Skin is warm and dry.  Multiple scratches and scrapes of arms bilaterally  3x2 erythematous fluctuant abscess in right axilla. No lymphangitis.  Psychiatric: He has a normal mood and affect. His behavior is normal.  Nursing note and vitals reviewed.    ED Treatments / Results  Labs (all labs ordered are listed, but only abnormal results are displayed) Labs Reviewed  COMPREHENSIVE METABOLIC PANEL - Abnormal; Notable for the following:       Result Value   Glucose, Bld 112 (*)    Anion gap 4 (*)    All other components within normal limits  CULTURE, BLOOD (ROUTINE X 2)  CULTURE, BLOOD (ROUTINE X 2)  CBC WITH DIFFERENTIAL/PLATELET  HIV ANTIBODY (ROUTINE TESTING)  RPR    EKG  EKG Interpretation None       Radiology Dg Chest 2 View  Result Date: 06/27/2017 CLINICAL DATA:  Left-sided chest pressure and shortness of breath. EXAM: CHEST  2 VIEW COMPARISON:  April 07, 2017 FINDINGS: The heart size and mediastinal contours are within normal limits. Both lungs are clear. The visualized skeletal structures are unremarkable. IMPRESSION: No active cardiopulmonary disease. Electronically Signed   By: Gerome Sam III M.D   On: 06/27/2017 13:55    Procedures Procedures (including critical care time)  Medications Ordered in ED Medications  lidocaine-EPINEPHrine (XYLOCAINE W/EPI) 2 %-1:200000 (PF) injection 10 mL (10 mLs Intradermal Given 06/27/17 1321)     Initial Impression / Assessment and Plan / ED Course  I have reviewed the triage vital signs and the nursing notes.  Pertinent labs & imaging results that  were available during my care of the patient were reviewed by me and considered in my medical decision making (see chart for details).  36 year old male presents with axillary abscess which is amenable to I&D. Vitals are normal. Labs are unremarkable. Abscess was successfully I&D'd and patient tolerated well. He does exhibit some manipulative behavior stating he is going to use more heroin if his pain is uncontrolled but he has not been verbally agressive. Ibuprofen was given.  Additionally he complains of chest pain. CXR is negative. HIV and RPR sent. Low suspicion for endocarditis with no fever, leukocytosis, or new heart murmur.   Final Clinical Impressions(s) / ED Diagnoses   Final diagnoses:  Abscess of axilla, right    New Prescriptions New Prescriptions   No medications on file     Bethel Born, PA-C 06/27/17 1451    Rolan Bucco, MD 06/27/17 1454

## 2017-06-27 NOTE — ED Notes (Signed)
Patient not in room. Unable to get blood work at this time.

## 2017-06-27 NOTE — Discharge Instructions (Signed)
You can take Tylenol or ibuprofen for pain °Keep wound clean with warm soap and water and keep bandage dry, do not submerge in water for 24 hours. Change bandage sooner if it gets dirty °Return for fever, increased redness, swelling, pain, or worsening drainage ° °

## 2017-06-27 NOTE — ED Notes (Signed)
Patient transported to X-ray 

## 2017-06-28 LAB — HIV ANTIBODY (ROUTINE TESTING W REFLEX): HIV Screen 4th Generation wRfx: NONREACTIVE

## 2017-06-28 LAB — RPR: RPR Ser Ql: NONREACTIVE

## 2017-07-16 ENCOUNTER — Other Ambulatory Visit: Payer: Self-pay

## 2017-07-16 ENCOUNTER — Emergency Department (HOSPITAL_COMMUNITY)
Admission: EM | Admit: 2017-07-16 | Discharge: 2017-07-17 | Disposition: A | Discharge status: Home / Self Care | Payer: Self-pay | Attending: Emergency Medicine | Admitting: Emergency Medicine

## 2017-07-16 ENCOUNTER — Encounter (HOSPITAL_COMMUNITY): Payer: Self-pay

## 2017-07-16 DIAGNOSIS — F17210 Nicotine dependence, cigarettes, uncomplicated: Secondary | ICD-10-CM | POA: Insufficient documentation

## 2017-07-16 DIAGNOSIS — L089 Local infection of the skin and subcutaneous tissue, unspecified: Secondary | ICD-10-CM | POA: Insufficient documentation

## 2017-07-16 MED ORDER — SULFAMETHOXAZOLE-TRIMETHOPRIM 800-160 MG PO TABS
1.0000 | ORAL_TABLET | Freq: Once | ORAL | Status: AC
  Administered 2017-07-16: 1 via ORAL
  Filled 2017-07-16: qty 1

## 2017-07-16 MED ORDER — IBUPROFEN 400 MG PO TABS
600.0000 mg | ORAL_TABLET | Freq: Once | ORAL | Status: AC
  Administered 2017-07-16: 600 mg via ORAL
  Filled 2017-07-16: qty 1

## 2017-07-16 NOTE — ED Triage Notes (Signed)
Pt here for boil on mid abdomen. Pt seen here 2 weeks ago for axillary boil. Pt covered in what appears to be track marks. Boils has been draining green drainage per the pt. VSS. Afebrile.

## 2017-07-17 MED ORDER — SULFAMETHOXAZOLE-TRIMETHOPRIM 800-160 MG PO TABS: 1.0000 | ORAL_TABLET | Freq: Two times a day (BID) | ORAL | 0 refills | Status: AC

## 2017-07-17 NOTE — Discharge Instructions (Signed)
Please see the information and instructions below regarding your visit.  Your diagnoses today include:  1. Skin infection    Cellulitis is a superficial skin infection. Please take your antibiotics as prescribed for their ENTIRE prescribed duration.   Tests performed today include: See side panel of your discharge paperwork for testing performed today. Vital signs are listed at the bottom of these instructions.   Medications prescribed:    Take any prescribed medications only as prescribed, and any over the counter medications only as directed on the packaging.  1. Please take all of your antibiotics until finished.   You may develop abdominal discomfort or nausea from the antibiotic. If this occurs, you may take it with food. Some patients also get diarrhea with antibiotics. You may help offset this with probiotics which you can buy or get in yogurt. Do not eat or take the probiotics until 2 hours after your antibiotic.  Some people develop allergies to antibiotics. Symptoms of antibiotic allergy can be mild and include a flat rash and itching. They can also be more serious and include:  ?Hives - Hives are raised, red patches of skin that are usually very itchy.  ?Lip or tongue swelling  ?Trouble swallowing or breathing  ?Blistering of the skin or mouth.  If you have any of these serious symptoms, please seek emergency medical care immediately.  Home care instructions:  Please follow any educational materials contained in this packet.    Keep affected area above the level of your heart when possible. Wash area gently twice a day with warm soapy water. Do not apply alcohol or hydrogen peroxide. Cover the area if it draining or weeping.   Follow-up instructions: Please follow-up with your primary care provider or the ED in 48 hours for a check of the infection if symptoms are not improving.   Please bear in mind that you may need additional drainage of your wounds. If they are not  improving, we will need to drain them.   Return instructions:  Please return to the Emergency Department if you experience worsening symptoms. Call your doctor sooner or return to the ER if you develop worsening signs of infection such as: increased redness, increased pain, pus, fever, or other symptoms that concern you. Please monitor the area we marked with a pen today. Please return if you have any other emergent concerns.  Additional Information:   Your vital signs today were: BP 125/81    Pulse 76    Temp 98.7 F (37.1 C) (Oral)    Resp 18    Ht 5\' 10"  (1.778 m)    Wt 77.1 kg (170 lb)    SpO2 100%    BMI 24.39 kg/m  If your blood pressure (BP) was elevated on multiple readings during this visit above 130 for the top number or above 80 for the bottom number, please have this repeated by your primary care provider within one month. --------------  Thank you for allowing us to participate in your care today. It was a pleasure taking care of you today!

## 2017-07-17 NOTE — ED Provider Notes (Signed)
[name] Provider Note   CSN: [id number] Arrival date & time: 07/16/17  2041     History   Chief Complaint Chief Complaint  Patient presents with  . Recurrent Skin Infections    HPI Albertina Parrnthony Gens is a 36 y.o. male.  HPI   Patient is a 36 y.o. Male with a PMH significant for polysubstance abuse, IVDU, chronic back pain, tobacco abuse and bipolar I disorder presenting for recurrent skin infections and abscesses. Patient presented on 06/27/2017 for a right axillary abscess and reports that it has recurred, along with an actively draining periumbilical abscess. Patient picks at these lesions and causes them to drain. Patient reports that he has been on antibiotics prior for these infections. Patient reports that these lesions are nonpainful. Patient denies fever, chills, rashes, erythematous patches of skin, nausea, vomiting, abdominal pain, chest pain, or shortness of breath. Last IVDU 2 days ago. Patient is currently staying in a homeless shelter.  Tetanus is up to date.  Past Medical History:  Diagnosis Date  . Bipolar 1 disorder (HCC)   . Chronic back pain   . Polysubstance abuse (HCC)   . Smoker     Patient Active Problem List   Diagnosis Date Noted  . Polysubstance abuse (HCC) 04/07/2017  . Leukocytosis 04/07/2017  . Alcohol dependence with unspecified alcohol-induced disorder (HCC)   . Opiate overdose (HCC) 12/13/2013  . Encephalopathy acute 12/13/2013  . Acute respiratory failure (HCC) 12/13/2013    History reviewed. No pertinent surgical history.     Home Medications    Prior to Admission medications   Medication Sig Start Date End Date Taking? Authorizing Provider  buPROPion (WELLBUTRIN XL) 150 MG 24 hr tablet Take 150 mg by mouth daily.    [provider]  ibuprofen (ADVIL,MOTRIN) 200 MG tablet Take 800 mg by mouth every 6 (six) hours as needed for headache or mild pain.    [provider]    meloxicam (MOBIC) 7.5 MG tablet Take 1 tablet (7.5 mg total) by mouth daily. 04/01/17   Cristina GongHammond, Elizabeth W, PA-C  risperiDONE (RISPERDAL) 2 MG tablet Take 1 tablet (2 mg total) by mouth at bedtime. Patient not taking: Reported on 03/31/2017 07/24/14   Rolland PorterJames, Mark, MD  sulfamethoxazole-trimethoprim (BACTRIM DS,SEPTRA DS) 800-160 MG tablet Take 1 tablet 2 (two) times daily for 10 days by mouth. 07/17/17 07/27/17  Elisha PonderMurray, Uniqua Kihn B, PA-C    Family History History reviewed. No pertinent family history.  Social History Social History   Tobacco Use  . Smoking status: Current Every Day Smoker    Types: Cigarettes  Substance Use Topics  . Alcohol use: Yes  . Drug use: Yes    Types: Cocaine, IV    Comment: Heroin     Allergies   Patient has no known allergies.   Review of Systems Review of Systems  Constitutional: Negative for chills and fever.  Respiratory: Negative for shortness of breath.   Cardiovascular: Negative for chest pain.  Gastrointestinal: Negative for nausea and vomiting.  Skin: Positive for wound.     Physical Exam Updated Vital Signs BP 109/73 (BP Location: Left Arm)   Pulse 72   Temp 98.7 F (37.1 C) (Oral)   Resp 18   Ht 5\' 10"  (1.778 m)   Wt 77.1 kg (170 lb)   SpO2 97%   BMI 24.39 kg/m   Physical Exam  Constitutional: He appears well-developed and well-nourished. No distress.  Sitting comfortably in bed.  HENT:  Head: Normocephalic and atraumatic.  Mouth/Throat: Oropharynx is clear and moist.  Eyes: Conjunctivae are normal. Right eye exhibits no discharge. Left eye exhibits no discharge.  EOMs normal to gross examination.  Neck: Normal range of motion.  Cardiovascular: Normal rate and regular rhythm.  Intact, 2+ radial pulse.  Pulmonary/Chest: Effort normal and breath sounds normal.  Normal respiratory effort. Patient converses comfortably. No audible wheeze or stridor.  Abdominal: He exhibits no distension.  Musculoskeletal: Normal range of  motion.  Neurological: He is alert.  Cranial nerves intact to gross observation. Patient moves extremities without difficulty.  Skin: He is not diaphoretic.  Approximately 2 cm area of induration with minimal surrounding erythema in right axilla.  Approximately 3 cm in diameter oblong area of induration with minimal surrounding erythema inferior to the umbilicus. All 4 extremities with healing lesions and scabs as well as track marks. No erythematous streaks of extremities.   Psychiatric: He has a normal mood and affect. His behavior is normal. Judgment and thought content normal.  Nursing note and vitals reviewed.    ED Treatments / Results  Labs (all labs ordered are listed, but only abnormal results are displayed) Labs Reviewed - No data to display  EKG  EKG Interpretation None       Radiology No results found.  Procedures Procedures (including critical care time)  Medications Ordered in ED Medications  sulfamethoxazole-trimethoprim (BACTRIM DS,SEPTRA DS) 800-160 MG per tablet 1 tablet (1 tablet Oral Given 07/16/17 2359)  ibuprofen (ADVIL,MOTRIN) tablet 600 mg (600 mg Oral Given 07/16/17 2359)     Initial Impression / Assessment and Plan / ED Course  I have reviewed the triage vital signs and the nursing notes.  Pertinent labs & imaging results that were available during my care of the patient were reviewed by me and considered in my medical decision making (see chart for details).     Final Clinical Impressions(s) / ED Diagnoses   Final diagnoses:  Skin infection   Patient with active IVDU presenting with recurrent skin abscesses. Patient is nontoxic appearing, afebrile, and in no acute distress. I discussed with the patient that these areas of induration require I & D but patient is refusing I & D and wishing only for antibiotics today. I explained to patient that these lesions may not be amenable to antibiotic treatment and that he will likely need to have I & D  performed, particularly of the periumbilical abscess. Patient verbalized understanding of this, and stated that he will return for I & D should they not resolved with antibiotics. Return precautions given for any fever, chills, increasing erythema, or growth of abscesses. Patient is in understanding and agrees with the plan of care.  Tetanus up to date.  ED Discharge Orders        Ordered    sulfamethoxazole-trimethoprim (BACTRIM DS,SEPTRA DS) 800-160 MG tablet  2 times daily     07/17/17 0004       Delia ChimesMurray, Zacheriah Stumpe B, PA-C 07/17/17 1327    Lorre NickAllen, Masami, MD 07/19/17 [phone]

## 2017-07-20 MED FILL — SULFAMETHOXAZOLE/TMP DS TAB: 800-160 | 10 days supply | Qty: 20 | Fill #0

## 2017-08-03 ENCOUNTER — Emergency Department (HOSPITAL_COMMUNITY)
Admission: EM | Admit: 2017-08-03 | Discharge: 2017-08-03 | Disposition: A | Discharge status: Home / Self Care | Payer: Self-pay | Attending: Emergency Medicine | Admitting: Emergency Medicine

## 2017-08-03 ENCOUNTER — Encounter (HOSPITAL_COMMUNITY): Payer: Self-pay | Admitting: Emergency Medicine

## 2017-08-03 DIAGNOSIS — T401X1A Poisoning by heroin, accidental (unintentional), initial encounter: Secondary | ICD-10-CM | POA: Insufficient documentation

## 2017-08-03 DIAGNOSIS — F17210 Nicotine dependence, cigarettes, uncomplicated: Secondary | ICD-10-CM | POA: Insufficient documentation

## 2017-08-03 NOTE — ED Provider Notes (Signed)
[facility]-EMERGENCY DEPT Provider Note   CSN: [id number] Arrival date & time: 08/03/17  1719     History   Chief Complaint Chief Complaint  Patient presents with  . Drug Overdose    HPI Oscar Burke is a 36 y.o. male.  HPI Patient presents after heroin abuse.  States he injected heroin before arrival and was found snoring.  States he thinks it could have been laced with something.  Given Narcan and woke up.  States he feels better now.  Last used yesterday.  He denies suicidal or homicidal thoughts.  States he has not used any other drugs recently.  States he has [facility].  States that he missed dinner there and ask as he can have some food here.  Injected into his right forearm. Past Medical History:  Diagnosis Date  . Bipolar 1 disorder (HCC)   . Chronic back pain   . Polysubstance abuse (HCC)   . Smoker     Patient Active Problem List   Diagnosis Date Noted  . Polysubstance abuse (HCC) 04/07/2017  . Leukocytosis 04/07/2017  . Alcohol dependence with unspecified alcohol-induced disorder (HCC)   . Opiate overdose (HCC) 12/13/2013  . Encephalopathy acute 12/13/2013  . Acute respiratory failure (HCC) 12/13/2013    History reviewed. No pertinent surgical history.     Home Medications    Prior to Admission medications   Medication Sig Start Date End Date Taking? Authorizing Provider  buPROPion (WELLBUTRIN XL) 150 MG 24 hr tablet Take 150 mg by mouth daily.    [provider]  ibuprofen (ADVIL,MOTRIN) 200 MG tablet Take 800 mg by mouth every 6 (six) hours as needed for headache or mild pain.    [provider]  meloxicam (MOBIC) 7.5 MG tablet Take 1 tablet (7.5 mg total) by mouth daily. 04/01/17   Cristina GongHammond, Elizabeth W, PA-C  risperiDONE (RISPERDAL) 2 MG tablet Take 1 tablet (2 mg total) by mouth at bedtime. Patient not taking: Reported on 03/31/2017 07/24/14   Rolland PorterJames, Mark, MD    Family History No  family history on file.  Social History Social History   Tobacco Use  . Smoking status: Current Every Day Smoker    Types: Cigarettes  . Smokeless tobacco: Never Used  Substance Use Topics  . Alcohol use: Yes  . Drug use: Yes    Types: Cocaine, IV    Comment: Heroin     Allergies   Patient has no known allergies.   Review of Systems Review of Systems  Constitutional: Negative for appetite change, chills and fever.  Respiratory: Negative for shortness of breath.   Cardiovascular: Negative for chest pain.  Gastrointestinal: Positive for nausea. Negative for abdominal pain.  Musculoskeletal: Negative for back pain.  Neurological: Negative for weakness and numbness.  Psychiatric/Behavioral: Positive for confusion.     Physical Exam Updated Vital Signs BP (!) 125/92 (BP Location: Right Arm)   Pulse 99   Temp 98.2 F (36.8 C) (Oral)   Resp 16   SpO2 100%   Physical Exam  Constitutional: He is oriented to person, place, and time. He appears well-developed.  HENT:  Head: Normocephalic.  Cardiovascular: Normal rate.  Pulmonary/Chest: Effort normal.  Abdominal: There is no tenderness.  Musculoskeletal: He exhibits no edema.  Neurological: He is alert and oriented to person, place, and time.  Skin:  Injection sites along vasculature of right forearm     ED Treatments / Results  Labs (all labs ordered  are listed, but only abnormal results are displayed) Labs Reviewed - No data to display  EKG  EKG Interpretation None       Radiology No results found.  Procedures Procedures (including critical care time)  Medications Ordered in ED Medications - No data to display   Initial Impression / Assessment and Plan / ED Course  I have reviewed the triage vital signs and the nursing notes.  Pertinent labs & imaging results that were available during my care of the patient were reviewed by me and considered in my medical decision making (see chart for  details).     Patient post heroin overdose.  Improved after Narcan.  Has been monitored for around 4 hours.  He is safe for discharge at this time.  States he has Narcan at home with him.  Will discharge.  Given substance abuse resources.  Final Clinical Impressions(s) / ED Diagnoses   Final diagnoses:  Accidental overdose of heroin, initial encounter [facility](HCC)    ED Discharge Orders    None       Benjiman CorePickering, Magdeline Prange, MD 08/03/17 2127

## 2017-08-03 NOTE — ED Triage Notes (Signed)
Patient here via [facility] from [facility]. Reports overdose on heroin. States that he uses everyday. Found by staff with snoring respirations. 4mg  intranasal narcan given. AAO x4. Ambulatory.

## 2017-10-02 ENCOUNTER — Other Ambulatory Visit: Payer: Self-pay

## 2017-10-02 ENCOUNTER — Encounter (HOSPITAL_COMMUNITY): Payer: Self-pay | Admitting: *Deleted

## 2017-10-02 ENCOUNTER — Emergency Department (HOSPITAL_COMMUNITY)
Admission: EM | Admit: 2017-10-02 | Discharge: 2017-10-02 | Disposition: A | Discharge status: Home / Self Care | Payer: Self-pay | Attending: Emergency Medicine | Admitting: Emergency Medicine

## 2017-10-02 DIAGNOSIS — L0291 Cutaneous abscess, unspecified: Secondary | ICD-10-CM

## 2017-10-02 DIAGNOSIS — Z79899 Other long term (current) drug therapy: Secondary | ICD-10-CM | POA: Insufficient documentation

## 2017-10-02 DIAGNOSIS — F17210 Nicotine dependence, cigarettes, uncomplicated: Secondary | ICD-10-CM | POA: Insufficient documentation

## 2017-10-02 DIAGNOSIS — L02214 Cutaneous abscess of groin: Secondary | ICD-10-CM | POA: Insufficient documentation

## 2017-10-02 HISTORY — DX: Unspecified viral hepatitis C without hepatic coma: B19.20

## 2017-10-02 LAB — CBC WITH DIFFERENTIAL/PLATELET
Basophils Absolute: 0 10*3/uL (ref 0.0–0.1)
Basophils Relative: 0 %
EOS ABS: 0.2 10*3/uL (ref 0.0–0.7)
EOS PCT: 1 %
HCT: 38.2 % — ABNORMAL LOW (ref 39.0–52.0)
HEMOGLOBIN: 12.7 g/dL — AB (ref 13.0–17.0)
LYMPHS ABS: 2.6 10*3/uL (ref 0.7–4.0)
LYMPHS PCT: 14 %
MCH: 29.7 pg (ref 26.0–34.0)
MCHC: 33.2 g/dL (ref 30.0–36.0)
MCV: 89.5 fL (ref 78.0–100.0)
MONOS PCT: 10 %
Monocytes Absolute: 1.8 10*3/uL — ABNORMAL HIGH (ref 0.1–1.0)
NEUTROS PCT: 75 %
Neutro Abs: 13.9 10*3/uL — ABNORMAL HIGH (ref 1.7–7.7)
Platelets: 194 10*3/uL (ref 150–400)
RBC: 4.27 MIL/uL (ref 4.22–5.81)
RDW: 13.3 % (ref 11.5–15.5)
WBC: 18.5 10*3/uL — ABNORMAL HIGH (ref 4.0–10.5)

## 2017-10-02 LAB — URINALYSIS, ROUTINE W REFLEX MICROSCOPIC
BILIRUBIN URINE: NEGATIVE
GLUCOSE, UA: NEGATIVE mg/dL
HGB URINE DIPSTICK: NEGATIVE
Ketones, ur: NEGATIVE mg/dL
Leukocytes, UA: NEGATIVE
Nitrite: NEGATIVE
PH: 6 (ref 5.0–8.0)
Protein, ur: NEGATIVE mg/dL
SPECIFIC GRAVITY, URINE: 1.003 — AB (ref 1.005–1.030)

## 2017-10-02 LAB — BASIC METABOLIC PANEL
Anion gap: 9 (ref 5–15)
BUN: 12 mg/dL (ref 6–20)
CO2: 26 mmol/L (ref 22–32)
Calcium: 9 mg/dL (ref 8.9–10.3)
Chloride: 98 mmol/L — ABNORMAL LOW (ref 101–111)
Creatinine, Ser: 0.8 mg/dL (ref 0.61–1.24)
GFR calc Af Amer: >60 mL/min (ref >60)
GFR calc non Af Amer: >60 mL/min (ref >60)
Glucose, Bld: 81 mg/dL (ref 65–99)
POTASSIUM: 3.4 mmol/L — AB (ref 3.5–5.1)
Sodium: 133 mmol/L — ABNORMAL LOW (ref 135–145)

## 2017-10-02 MED ORDER — IBUPROFEN 800 MG PO TABS
800.0000 mg | ORAL_TABLET | Freq: Once | ORAL | Status: AC
  Administered 2017-10-02: 800 mg via ORAL
  Filled 2017-10-02: qty 1

## 2017-10-02 MED ORDER — ONDANSETRON HCL 4 MG/2ML IJ SOLN
4.0000 mg | Freq: Once | INTRAMUSCULAR | Status: AC
  Administered 2017-10-02: 4 mg via INTRAVENOUS
  Filled 2017-10-02: qty 2

## 2017-10-02 MED ORDER — ACETAMINOPHEN 500 MG PO TABS
1000.0000 mg | ORAL_TABLET | Freq: Once | ORAL | Status: AC
  Administered 2017-10-02: 1000 mg via ORAL
  Filled 2017-10-02: qty 2

## 2017-10-02 MED ORDER — BACITRACIN ZINC 500 UNIT/GM EX OINT
TOPICAL_OINTMENT | Freq: Once | CUTANEOUS | Status: AC
  Administered 2017-10-02: 1 via TOPICAL
  Filled 2017-10-02: qty 1.8

## 2017-10-02 MED ORDER — SULFAMETHOXAZOLE-TRIMETHOPRIM 800-160 MG PO TABS: 1.0000 | ORAL_TABLET | Freq: Two times a day (BID) | ORAL | 0 refills | Status: AC

## 2017-10-02 MED ORDER — TRAMADOL HCL 50 MG PO TABS: 50.0000 mg | ORAL_TABLET | Freq: Four times a day (QID) | ORAL | 0 refills | Status: DC | PRN

## 2017-10-02 MED ORDER — MORPHINE SULFATE (PF) 4 MG/ML IV SOLN
4.0000 mg | Freq: Once | INTRAVENOUS | Status: AC
  Administered 2017-10-02: 4 mg via INTRAVENOUS
  Filled 2017-10-02: qty 1

## 2017-10-02 MED ORDER — LIDOCAINE-EPINEPHRINE (PF) 2 %-1:200000 IJ SOLN
10.0000 mL | Freq: Once | INTRAMUSCULAR | Status: AC
  Administered 2017-10-02: 10 mL
  Filled 2017-10-02: qty 20

## 2017-10-02 MED ORDER — VANCOMYCIN HCL IN DEXTROSE 1-5 GM/200ML-% IV SOLN
1000.0000 mg | Freq: Once | INTRAVENOUS | Status: AC
  Administered 2017-10-02: 1000 mg via INTRAVENOUS
  Filled 2017-10-02: qty 200

## 2017-10-02 MED ORDER — SODIUM CHLORIDE 0.9 % IV BOLUS (SEPSIS)
1000.0000 mL | Freq: Once | INTRAVENOUS | Status: AC
  Administered 2017-10-02: 1000 mL via INTRAVENOUS

## 2017-10-02 NOTE — ED Notes (Signed)
I&D tray and lidocaine at bedside 

## 2017-10-02 NOTE — ED Triage Notes (Signed)
Pt states he had what he thought was a puss pocket in rt fem area, now is swollen and red with swelling and redness going into penis.Intermittient sores on legs and buttocks

## 2017-10-02 NOTE — ED Provider Notes (Signed)
[facility]-EMERGENCY DEPT Provider Note   CSN: [id number] Arrival date & time: 10/02/17  1705     History   Chief Complaint Chief Complaint  Patient presents with  . Abscess    HPI Oscar Burke is a 37 y.o. male.  Pt presents to the ED today with swelling and redness to right groin.  Pt has a hx of IVDA usually with heroin.  The pt developed the redness a few days ago and it has swollen up.      Past Medical History:  Diagnosis Date  . Bipolar 1 disorder (HCC)   . Chronic back pain   . Hepatitis C   . Polysubstance abuse (HCC)   . Smoker     Patient Active Problem List   Diagnosis Date Noted  . Polysubstance abuse (HCC) 04/07/2017  . Leukocytosis 04/07/2017  . Alcohol dependence with unspecified alcohol-induced disorder (HCC)   . Opiate overdose (HCC) 12/13/2013  . Encephalopathy acute 12/13/2013  . Acute respiratory failure (HCC) 12/13/2013    Past Surgical History:  Procedure Laterality Date  . ARM AMPUTATION    . MANDIBLE FRACTURE SURGERY         Home Medications    Prior to Admission medications   Medication Sig Start Date End Date Taking? Authorizing Provider  buPROPion (WELLBUTRIN XL) 150 MG 24 hr tablet Take 150 mg by mouth daily.    [provider]  ibuprofen (ADVIL,MOTRIN) 200 MG tablet Take 800 mg by mouth every 6 (six) hours as needed for headache or mild pain.    [provider]  meloxicam (MOBIC) 7.5 MG tablet Take 1 tablet (7.5 mg total) by mouth daily. 04/01/17   Cristina Gong, PA-C  risperiDONE (RISPERDAL) 2 MG tablet Take 1 tablet (2 mg total) by mouth at bedtime. Patient not taking: Reported on 03/31/2017 07/24/14   Rolland Porter, MD  sulfamethoxazole-trimethoprim (BACTRIM DS,SEPTRA DS) 800-160 MG tablet Take 1 tablet by mouth 2 (two) times daily for 7 days. 10/02/17 10/09/17  Jacalyn Lefevre, MD  traMADol (ULTRAM) 50 MG tablet Take 1 tablet (50 mg total) by mouth every 6 (six) hours as needed.  10/02/17   Jacalyn Lefevre, MD    Family History No family history on file.  Social History Social History   Tobacco Use  . Smoking status: Current Every Day Smoker    Types: Cigarettes  . Smokeless tobacco: Never Used  Substance Use Topics  . Alcohol use: Yes  . Drug use: Yes    Types: Cocaine, IV    Comment: Heroin     Allergies   Patient has no known allergies.   Review of Systems Review of Systems  Skin:       Track marks all over arms Large right groin abscess with redness/swelling to right scrotum  All other systems reviewed and are negative.    Physical Exam Updated Vital Signs BP 109/64   Pulse 73   Temp (!) 102.6 F (39.2 C) (Oral)   Resp 16   Ht 5\' 11"  (1.803 m)   Wt 88.5 kg (195 lb)   SpO2 97%   BMI 27.20 kg/m   Physical Exam  Constitutional: He is oriented to person, place, and time. He appears well-developed and well-nourished.  HENT:  Head: Normocephalic and atraumatic.  Right Ear: External ear normal.  Left Ear: External ear normal.  Nose: Nose normal.  Mouth/Throat: Oropharynx is clear and moist.  Eyes: Conjunctivae and EOM are normal. Pupils are equal, round, and  reactive to light.  Neck: Normal range of motion. Neck supple.  Cardiovascular: Regular rhythm, normal heart sounds and intact distal pulses. Tachycardia present.  Pulmonary/Chest: Effort normal and breath sounds normal.  Abdominal: Soft. Bowel sounds are normal.  Genitourinary:     Musculoskeletal: Normal range of motion.  Neurological: He is alert and oriented to person, place, and time.  Skin: Skin is warm. Capillary refill takes less than 2 seconds.  Psychiatric: He has a normal mood and affect. His behavior is normal. Judgment and thought content normal.  Nursing note and vitals reviewed.    ED Treatments / Results  Labs (all labs ordered are listed, but only abnormal results are displayed) Labs Reviewed  BASIC METABOLIC PANEL - Abnormal; Notable for the  following components:      Result Value   Sodium 133 (*)    Potassium 3.4 (*)    Chloride 98 (*)    All other components within normal limits  CBC WITH DIFFERENTIAL/PLATELET - Abnormal; Notable for the following components:   WBC 18.5 (*)    Hemoglobin 12.7 (*)    HCT 38.2 (*)    Neutro Abs 13.9 (*)    Monocytes Absolute 1.8 (*)    All other components within normal limits  URINALYSIS, ROUTINE W REFLEX MICROSCOPIC - Abnormal; Notable for the following components:   Specific Gravity, Urine 1.003 (*)    All other components within normal limits    EKG  EKG Interpretation None       Radiology No results found.  Procedures .Marland Kitchen.Incision and Drainage Date/Time: 10/02/2017 10:59 PM Performed by: Jacalyn LefevreHaviland, Maycee Blasco, MD Authorized by: Jacalyn LefevreHaviland, Sydnei Ohaver, MD   Consent:    Consent obtained:  Verbal   Consent given by:  Patient   Risks discussed:  Bleeding, incomplete drainage and pain   Alternatives discussed:  No treatment Location:    Type:  Abscess   Location:  Trunk   Trunk location:  Abdomen Pre-procedure details:    Skin preparation:  Betadine Anesthesia (see MAR for exact dosages):    Anesthesia method:  Local infiltration   Local anesthetic:  Lidocaine 2% WITH epi Procedure type:    Complexity:  Simple Procedure details:    Incision types:  Cruciate   Incision depth:  Submucosal   Scalpel blade:  11   Wound management:  Probed and deloculated   Drainage:  Purulent   Drainage amount:  Copious   Wound treatment:  Wound left open   Packing materials:  None Post-procedure details:    Patient tolerance of procedure:  Tolerated well, no immediate complications   (including critical care time)  Medications Ordered in ED Medications  vancomycin (VANCOCIN) IVPB 1000 mg/200 mL premix (1,000 mg Intravenous New Bag/Given 10/02/17 2210)  sodium chloride 0.9 % bolus 1,000 mL (1,000 mLs Intravenous New Bag/Given 10/02/17 2210)  morphine 4 MG/ML injection 4 mg (4 mg Intravenous  Given 10/02/17 2211)  ondansetron (ZOFRAN) injection 4 mg (4 mg Intravenous Given 10/02/17 2211)  lidocaine-EPINEPHrine (XYLOCAINE W/EPI) 2 %-1:200000 (PF) injection 10 mL (10 mLs Infiltration Given by Other 10/02/17 2157)  ibuprofen (ADVIL,MOTRIN) tablet 800 mg (800 mg Oral Given 10/02/17 2156)  acetaminophen (TYLENOL) tablet 1,000 mg (1,000 mg Oral Given 10/02/17 2156)     Initial Impression / Assessment and Plan / ED Course  I have reviewed the triage vital signs and the nursing notes.  Pertinent labs & imaging results that were available during my care of the patient were reviewed by me and considered in  my medical decision making (see chart for details).   Pt given dose of vancomycin in the ED.  He will be d/c with bactrim and tramadol.  He knows to return if worse and f/u with surgery for more drainage if necessary.  Final Clinical Impressions(s) / ED Diagnoses   Final diagnoses:  Abscess    ED Discharge Orders        Ordered    sulfamethoxazole-trimethoprim (BACTRIM DS,SEPTRA DS) 800-160 MG tablet  2 times daily     10/02/17 2257    traMADol (ULTRAM) 50 MG tablet  Every 6 hours PRN     10/02/17 2257       Jacalyn Lefevre, MD 10/02/17 2300

## 2017-10-02 NOTE — ED Notes (Signed)
AVS explained in detail to patient. Knows to follow up with MD [facility] in 2 days (gen surgery). Knows to take antibiotic until completion and tramadol as needed. Is not driving home, received IV Morphine. Bacitracin applied to right groin I&D site. Non stick dressing applied and securely taped. Sent home with supplies to keep site dry and clean. Knows which symptoms to return for. Sandwich and juice given. No other c/c. Ambulatory upon discharge.

## 2017-10-02 NOTE — ED Notes (Signed)
Called Pt to be roomed, no response in lobby x1. 

## 2017-10-05 MED FILL — SULFAMETHOXAZOLE-TMP DS TAB: 800-160 | 7 days supply | Qty: 14 | Fill #0

## 2017-11-19 ENCOUNTER — Emergency Department (HOSPITAL_COMMUNITY)
Admission: EM | Admit: 2017-11-19 | Discharge: 2017-11-19 | Disposition: A | Discharge status: Home / Self Care | Payer: Self-pay | Attending: Emergency Medicine | Admitting: Emergency Medicine

## 2017-11-19 ENCOUNTER — Encounter (HOSPITAL_COMMUNITY): Payer: Self-pay | Admitting: Emergency Medicine

## 2017-11-19 ENCOUNTER — Emergency Department (HOSPITAL_COMMUNITY): Payer: Self-pay

## 2017-11-19 ENCOUNTER — Other Ambulatory Visit: Payer: Self-pay

## 2017-11-19 DIAGNOSIS — R002 Palpitations: Secondary | ICD-10-CM | POA: Insufficient documentation

## 2017-11-19 DIAGNOSIS — R51 Headache: Secondary | ICD-10-CM | POA: Insufficient documentation

## 2017-11-19 DIAGNOSIS — F17210 Nicotine dependence, cigarettes, uncomplicated: Secondary | ICD-10-CM | POA: Insufficient documentation

## 2017-11-19 DIAGNOSIS — F1910 Other psychoactive substance abuse, uncomplicated: Secondary | ICD-10-CM | POA: Insufficient documentation

## 2017-11-19 DIAGNOSIS — R0602 Shortness of breath: Secondary | ICD-10-CM | POA: Insufficient documentation

## 2017-11-19 LAB — RAPID URINE DRUG SCREEN, HOSP PERFORMED
Amphetamines: POSITIVE — AB
BARBITURATES: NOT DETECTED
Benzodiazepines: NOT DETECTED
Cocaine: POSITIVE — AB
Opiates: POSITIVE — AB
Tetrahydrocannabinol: NOT DETECTED

## 2017-11-19 LAB — I-STAT TROPONIN, ED: Troponin i, poc: 0 ng/mL (ref 0.00–0.08)

## 2017-11-19 MED ORDER — LOPERAMIDE HCL 2 MG PO CAPS: 2.0000 mg | ORAL_CAPSULE | Freq: Four times a day (QID) | ORAL | 0 refills | Status: DC | PRN

## 2017-11-19 MED ORDER — ONDANSETRON HCL 4 MG PO TABS: 4.0000 mg | ORAL_TABLET | Freq: Three times a day (TID) | ORAL | 0 refills | Status: DC | PRN

## 2017-11-19 NOTE — ED Provider Notes (Signed)
[facility]-EMERGENCY DEPT Provider Note   CSN: [id number] Arrival date & time: 11/19/17  0020     History   Chief Complaint Chief Complaint  Patient presents with  . Drug Overdose    HPI Oscar Burke is a 37 y.o. male presenting for evaluation after a drug overdose.  Patient states he injected heroin around 10:00 last night.  He subsequently became unconscious, believes he hit his head on the ground at some point.  EMS was called, and he was given Narcan.  He had an episode of vomiting after being given IN Narcan.  At that time, he declined transport to the hospital.  He had one episode of hemoptysis, which made him concerned so he came to the emergency room.  He had mild epistaxis noted with EMS which resolved without intervention.  He has not had any hemoptysis since the initial event.  He currently reports headache of the left temple, where he hit his head.  He denies vision changes, slurred speech, chest pain, shortness of breath, nausea, vomiting, urinary symptoms, normal bowel movements, numbness, or tingling.  He is ambulatory without difficulty.  He states that he feels off.  He states he thinks he took just heroin, but is not positive.  He smokes cigarettes, denies alcohol use.  Patient states he wants to detox from heroin, but knows he will get sick if he does not use.  He states the only way that he can detox quickly as if he claims he is suicidal.  He denies suicidal thoughts at this time.    HPI  Past Medical History:  Diagnosis Date  . Bipolar 1 disorder (HCC)   . Chronic back pain   . Hepatitis C   . Polysubstance abuse (HCC)   . Smoker     Patient Active Problem List   Diagnosis Date Noted  . Polysubstance abuse (HCC) 04/07/2017  . Leukocytosis 04/07/2017  . Alcohol dependence with unspecified alcohol-induced disorder (HCC)   . Opiate overdose (HCC) 12/13/2013  . Encephalopathy acute 12/13/2013  . Acute respiratory failure (HCC)  12/13/2013    Past Surgical History:  Procedure Laterality Date  . ARM AMPUTATION    . MANDIBLE FRACTURE SURGERY         Home Medications    Prior to Admission medications   Medication Sig Start Date End Date Taking? Authorizing Provider  loperamide (IMODIUM) 2 MG capsule Take 1 capsule (2 mg total) by mouth 4 (four) times daily as needed for diarrhea or loose stools. 11/19/17   Aswad Wandrey, PA-C  meloxicam (MOBIC) 7.5 MG tablet Take 1 tablet (7.5 mg total) by mouth daily. Patient not taking: Reported on 11/19/2017 04/01/17   Cristina Gong, PA-C  ondansetron [facility]) 4 MG tablet Take 1 tablet (4 mg total) by mouth every 8 (eight) hours as needed for nausea or vomiting. 11/19/17   Torez Beauregard, PA-C  risperiDONE (RISPERDAL) 2 MG tablet Take 1 tablet (2 mg total) by mouth at bedtime. Patient not taking: Reported on 03/31/2017 07/24/14   Rolland Porter, MD  traMADol (ULTRAM) 50 MG tablet Take 1 tablet (50 mg total) by mouth every 6 (six) hours as needed. Patient not taking: Reported on 11/19/2017 10/02/17   Jacalyn Lefevre, MD    Family History Family History  Problem Relation Age of Onset  . Cancer Mother     Social History Social History   Tobacco Use  . Smoking status: Current Every Day Smoker    Types: Cigarettes  .  Smokeless tobacco: Never Used  Substance Use Topics  . Alcohol use: No    Frequency: Never  . Drug use: Yes    Types: Cocaine, IV    Comment: Heroin, ice, crack, and morphine pills     Allergies   Patient has no known allergies.   Review of Systems Review of Systems  Neurological: Positive for headaches.  All other systems reviewed and are negative.    Physical Exam Updated Vital Signs BP 114/70 (BP Location: Right Arm)   Pulse 65   Temp 97.8 F (36.6 C) (Oral)   Resp 18   SpO2 99%   Physical Exam  Constitutional: He is oriented to person, place, and time. He appears well-developed and well-nourished. No distress.  HENT:    Head: Normocephalic.  Right Ear: Tympanic membrane, external ear and ear canal normal. No hemotympanum.  Left Ear: Tympanic membrane, external ear and ear canal normal. No hemotympanum.  Nose: Nose normal. No epistaxis.  Mouth/Throat: Uvula is midline, oropharynx is clear and moist and mucous membranes are normal.  Contusion of the left eyebrow without laceration.  Tenderness palpation of the left forehead.  No obvious injury or pain noted elsewhere on the head.  Eyes: Conjunctivae and EOM are normal. Pupils are equal, round, and reactive to light.  Neck: Normal range of motion. Neck supple.  No tenderness palpation midline C-spine.  Full range of motion of the head.  Cardiovascular: Normal rate, regular rhythm and intact distal pulses.  HR slightly brady, 50-60   Pulmonary/Chest: Effort normal and breath sounds normal. No respiratory distress. He has no wheezes.  Abdominal: Soft. He exhibits no distension and no mass. There is no tenderness. There is no guarding.  Musculoskeletal: Normal range of motion.  Strength intact x4.  Sensation intact x4.  Radial and pedal pulses equal bilaterally.  Neurological: He is alert and oriented to person, place, and time. He has normal strength. No cranial nerve deficit or sensory deficit. GCS eye subscore is 4. GCS verbal subscore is 5. GCS motor subscore is 6.  Responses are slow, but speech is coherent and appropriate.   Skin: Skin is warm and dry.  Psychiatric: He has a normal mood and affect.  Nursing note and vitals reviewed.    ED Treatments / Results  Labs (all labs ordered are listed, but only abnormal results are displayed) Labs Reviewed  RAPID URINE DRUG SCREEN, HOSP PERFORMED - Abnormal; Notable for the following components:      Result Value   Opiates POSITIVE (*)    Cocaine POSITIVE (*)    Amphetamines POSITIVE (*)    All other components within normal limits  I-STAT TROPONIN, ED    EKG  EKG Interpretation  Date/Time:  Friday  November 19 2017 03:15:03 EDT Ventricular Rate:  66 PR Interval:    QRS Duration: 102 QT Interval:  416 QTC Calculation: 436 R Axis:   72 Text Interpretation:  Sinus rhythm No significant change since last tracing Confirmed by Rochele Raring [phone]) on 11/19/2017 3:34:05 AM Also confirmed by Rochele Raring [phone]), editor Elita Quick ([id number])  on 11/19/2017 7:12:07 AM       Radiology Dg Chest 2 View  Result Date: 11/19/2017 CLINICAL DATA:  Acute onset of shortness of breath and hemoptysis. Overdose. Tachycardia. EXAM: CHEST - 2 VIEW COMPARISON:  Chest radiograph performed 06/27/2017 FINDINGS: The lungs are well-aerated and clear. There is no evidence of focal opacification, pleural effusion or pneumothorax. The heart is normal in size; the mediastinal contour  is within normal limits. No acute osseous abnormalities are seen. IMPRESSION: No acute cardiopulmonary process seen. Electronically Signed   By: Roanna Raider M.D.   On: 11/19/2017 02:51   Ct Head Wo Contrast  Result Date: 11/19/2017 CLINICAL DATA:  Status post fall with injury to left side of head. EXAM: CT HEAD WITHOUT CONTRAST CT MAXILLOFACIAL WITHOUT CONTRAST CT CERVICAL SPINE WITHOUT CONTRAST TECHNIQUE: Multidetector CT imaging of the head, cervical spine, and maxillofacial structures were performed using the standard protocol without intravenous contrast. Multiplanar CT image reconstructions of the cervical spine and maxillofacial structures were also generated. COMPARISON:  Head CT 12/13/2013 FINDINGS: CT HEAD FINDINGS Brain: There is no evidence for acute hemorrhage, hydrocephalus, mass lesion, or abnormal extra-axial fluid collection. No definite CT evidence for acute infarction. Vascular: No hyperdense vessel or unexpected calcification. Skull: No evidence for fracture. No worrisome lytic or sclerotic lesion. Other: None. CT MAXILLOFACIAL FINDINGS Osseous: No fracture or mandibular dislocation. No destructive process. Fixation  hardware noted anterior left mandible. Orbits: Negative. No traumatic or inflammatory finding. Sinuses: Chronic mucosal thickening in the maxillary sinuses bilaterally. Soft tissues: Negative. CT CERVICAL SPINE FINDINGS Alignment: Normal. Skull base and vertebrae: No acute fracture. No primary bone lesion or focal pathologic process. Soft tissues and spinal canal: No prevertebral fluid or swelling. No visible canal hematoma. Disc levels:  Preserved. Upper chest: Negative. Other: None. IMPRESSION: 1. Normal CT exam of the brain. 2. 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 3. No cervical spine fracture. Loss of cervical lordosis. This can be related to patient positioning, muscle spasm or soft tissue injury. Electronically Signed   By: Kennith Center M.D.   On: 11/19/2017 09:00   Ct Cervical Spine Wo Contrast  Result Date: 11/19/2017 CLINICAL DATA:  Status post fall with injury to left side of head. EXAM: CT HEAD WITHOUT CONTRAST CT MAXILLOFACIAL WITHOUT CONTRAST CT CERVICAL SPINE WITHOUT CONTRAST TECHNIQUE: Multidetector CT imaging of the head, cervical spine, and maxillofacial structures were performed using the standard protocol without intravenous contrast. Multiplanar CT image reconstructions of the cervical spine and maxillofacial structures were also generated. COMPARISON:  Head CT 12/13/2013 FINDINGS: CT HEAD FINDINGS Brain: There is no evidence for acute hemorrhage, hydrocephalus, mass lesion, or abnormal extra-axial fluid collection. No definite CT evidence for acute infarction. Vascular: No hyperdense vessel or unexpected calcification. Skull: No evidence for fracture. No worrisome lytic or sclerotic lesion. Other: None. CT MAXILLOFACIAL FINDINGS Osseous: No fracture or mandibular dislocation. No destructive process. Fixation hardware noted anterior left mandible. Orbits: Negative. No traumatic or inflammatory finding. Sinuses: Chronic mucosal thickening in the  maxillary sinuses bilaterally. Soft tissues: Negative. CT CERVICAL SPINE FINDINGS Alignment: Normal. Skull base and vertebrae: No acute fracture. No primary bone lesion or focal pathologic process. Soft tissues and spinal canal: No prevertebral fluid or swelling. No visible canal hematoma. Disc levels:  Preserved. Upper chest: Negative. Other: None. IMPRESSION: 1. Normal CT exam of the brain. 2. 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 3. No cervical spine fracture. Loss of cervical lordosis. This can be related to patient positioning, muscle spasm or soft tissue injury. Electronically Signed   By: Kennith Center M.D.   On: 11/19/2017 09:00   Ct Maxillofacial Wo Contrast  Result Date: 11/19/2017 CLINICAL DATA:  Status post fall with injury to left side of head. EXAM: CT HEAD WITHOUT CONTRAST CT MAXILLOFACIAL WITHOUT CONTRAST CT CERVICAL SPINE WITHOUT CONTRAST TECHNIQUE: Multidetector CT imaging of the head, cervical spine, and maxillofacial structures  were performed using the standard protocol without intravenous contrast. Multiplanar CT image reconstructions of the cervical spine and maxillofacial structures were also generated. COMPARISON:  Head CT 12/13/2013 FINDINGS: CT HEAD FINDINGS Brain: There is no evidence for acute hemorrhage, hydrocephalus, mass lesion, or abnormal extra-axial fluid collection. No definite CT evidence for acute infarction. Vascular: No hyperdense vessel or unexpected calcification. Skull: No evidence for fracture. No worrisome lytic or sclerotic lesion. Other: None. CT MAXILLOFACIAL FINDINGS Osseous: No fracture or mandibular dislocation. No destructive process. Fixation hardware noted anterior left mandible. Orbits: Negative. No traumatic or inflammatory finding. Sinuses: Chronic mucosal thickening in the maxillary sinuses bilaterally. Soft tissues: Negative. CT CERVICAL SPINE FINDINGS Alignment: Normal. Skull base and vertebrae: No acute fracture.  No primary bone lesion or focal pathologic process. Soft tissues and spinal canal: No prevertebral fluid or swelling. No visible canal hematoma. Disc levels:  Preserved. Upper chest: Negative. Other: None. IMPRESSION: 1. Normal CT exam of the brain. 2. 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 3. No cervical spine fracture. Loss of cervical lordosis. This can be related to patient positioning, muscle spasm or soft tissue injury. Electronically Signed   By: Kennith CenterEric  Mansell M.D.   On: 11/19/2017 09:00    Procedures Procedures (including critical care time)  Medications Ordered in ED Medications - No data to display   Initial Impression / Assessment and Plan / ED Course  I have reviewed the triage vital signs and the nursing notes.  Pertinent labs & imaging results that were available during my care of the patient were reviewed by me and considered in my medical decision making (see chart for details).     Patient presenting for evaluation after drug dose.  No obvious neurologic deficits, although responses are slowed.  Patient had a head injury while unconscious, will obtain CT head neck and maxillofacial.  UDS ordered. CXR viewed and interpreted by me, no acute findings. EKG reassuring, no STEMI.   UDS positive for cocaine, opioids, and amphetamines. CT shows no sign of fracture, dislocation, or bleed.  Patient reporting occasional palpitations, will obtain troponin due to recent cocaine use and cardiac complaint.  Patient speech and responses have returned to a normal speed.  Troponin negative.  Discussed findings with patient.  Discussed importance of abstaining from drug use.  Given prescription for Zofran and Imodium for symptom control and attempt to detox.  Information for outpatient resources given.  At this time, patient appears safe for discharge.  Return precautions given.  Patient states he understands and agrees to plan.   Final Clinical  Impressions(s) / ED Diagnoses   Final diagnoses:  Polysubstance abuse [facility](HCC)    ED Discharge Orders        Ordered    ondansetron (ZOFRAN) 4 MG tablet  Every 8 hours PRN     11/19/17 1024    loperamide (IMODIUM) 2 MG capsule  4 times daily PRN     11/19/17 1024       Ishaan Villamar, PA-C 11/19/17 1043    Azalia Bilisampos, Kevin, MD 11/19/17 1626

## 2017-11-19 NOTE — ED Notes (Signed)
Pt given sandwich and a coke

## 2017-11-19 NOTE — Discharge Instructions (Signed)
Use Zofran and Imodium as needed for symptom control. Stop using opioids, cocaine, and amphetamines. There is information about outpatient counseling and substance abuse services in this paperwork.  You may contact them for further help. You may follow-up with [place] and wellness as needed to establish primary care. Return to the emergency room if you develop difficulty breathing, seizures, chest pain, or any new or concerning symptoms.

## 2017-11-19 NOTE — ED Triage Notes (Signed)
Pt states he was at [facility] and overdosed on heroin  Pt states he snorted heroin twice and shot up once  Pt states EMS came and gave him narcan and when he came to he was panicking and had an episode of vomiting  Pt refused transport  Pt states after they left he went upstairs and laid down and began to feel bad and started coughing and coughed up some blood  Pt states he feels anxious

## 2017-11-19 NOTE — ED Notes (Signed)
Pt given sandwich and ginger ale.

## 2018-06-01 ENCOUNTER — Encounter (HOSPITAL_COMMUNITY): Payer: Self-pay

## 2018-06-01 DIAGNOSIS — Z79899 Other long term (current) drug therapy: Secondary | ICD-10-CM | POA: Insufficient documentation

## 2018-06-01 DIAGNOSIS — F17210 Nicotine dependence, cigarettes, uncomplicated: Secondary | ICD-10-CM | POA: Insufficient documentation

## 2018-06-01 DIAGNOSIS — J111 Influenza due to unidentified influenza virus with other respiratory manifestations: Secondary | ICD-10-CM | POA: Insufficient documentation

## 2018-06-01 MED ORDER — ACETAMINOPHEN 325 MG PO TABS
650.0000 mg | ORAL_TABLET | Freq: Once | ORAL | Status: AC | PRN
  Administered 2018-06-01: 650 mg via ORAL
  Filled 2018-06-01: qty 2

## 2018-06-01 NOTE — ED Triage Notes (Signed)
Pt has multiple complaints aches with a , productive cough , and malaise x 2 days that has gotten worse. Pt denies taking any medication at home. Denies n/v/d. Pt is escorted with SO. Pt reports SOB , pt is able to talk in complete sentences and is breathing unlabored at this time Oxygen saturation in 100% . Pt given tylenol in triage.

## 2018-06-02 ENCOUNTER — Emergency Department (HOSPITAL_COMMUNITY): Payer: Self-pay

## 2018-06-02 ENCOUNTER — Emergency Department (HOSPITAL_COMMUNITY)
Admission: EM | Admit: 2018-06-02 | Discharge: 2018-06-02 | Disposition: A | Discharge status: Home / Self Care | Payer: Self-pay | Attending: Emergency Medicine | Admitting: Emergency Medicine

## 2018-06-02 DIAGNOSIS — R69 Illness, unspecified: Secondary | ICD-10-CM

## 2018-06-02 DIAGNOSIS — J111 Influenza due to unidentified influenza virus with other respiratory manifestations: Secondary | ICD-10-CM

## 2018-06-02 LAB — CBC WITH DIFFERENTIAL/PLATELET
Basophils Absolute: 0 10*3/uL (ref 0.0–0.1)
Basophils Relative: 0 %
Eosinophils Absolute: 0 10*3/uL (ref 0.0–0.7)
Eosinophils Relative: 0 %
HEMATOCRIT: 42.5 % (ref 39.0–52.0)
Hemoglobin: 14.2 g/dL (ref 13.0–17.0)
LYMPHS ABS: 1.1 10*3/uL (ref 0.7–4.0)
Lymphocytes Relative: 8 %
MCH: 29.5 pg (ref 26.0–34.0)
MCHC: 33.4 g/dL (ref 30.0–36.0)
MCV: 88.4 fL (ref 78.0–100.0)
MONO ABS: 1.4 10*3/uL — AB (ref 0.1–1.0)
MONOS PCT: 10 %
NEUTROS ABS: 11.7 10*3/uL — AB (ref 1.7–7.7)
Neutrophils Relative %: 82 %
Platelets: 154 10*3/uL (ref 150–400)
RBC: 4.81 MIL/uL (ref 4.22–5.81)
RDW: 12.8 % (ref 11.5–15.5)
WBC: 14.2 10*3/uL — ABNORMAL HIGH (ref 4.0–10.5)

## 2018-06-02 LAB — URINALYSIS, ROUTINE W REFLEX MICROSCOPIC
BILIRUBIN URINE: NEGATIVE
Glucose, UA: NEGATIVE mg/dL
KETONES UR: NEGATIVE mg/dL
Leukocytes, UA: NEGATIVE
Nitrite: NEGATIVE
PH: 7 (ref 5.0–8.0)
Protein, ur: 30 mg/dL — AB
SPECIFIC GRAVITY, URINE: 1.017 (ref 1.005–1.030)

## 2018-06-02 LAB — BASIC METABOLIC PANEL
ANION GAP: 11 (ref 5–15)
BUN: 8 mg/dL (ref 6–20)
CHLORIDE: 96 mmol/L — AB (ref 98–111)
CO2: 28 mmol/L (ref 22–32)
Calcium: 9 mg/dL (ref 8.9–10.3)
Creatinine, Ser: 0.67 mg/dL (ref 0.61–1.24)
GFR calc Af Amer: >60 mL/min (ref >60)
GFR calc non Af Amer: >60 mL/min (ref >60)
GLUCOSE: 119 mg/dL — AB (ref 70–99)
POTASSIUM: 3.3 mmol/L — AB (ref 3.5–5.1)
Sodium: 135 mmol/L (ref 135–145)

## 2018-06-02 LAB — INFLUENZA PANEL BY PCR (TYPE A & B)
Influenza A By PCR: NEGATIVE
Influenza B By PCR: NEGATIVE

## 2018-06-02 MED ORDER — KETOROLAC TROMETHAMINE 30 MG/ML IJ SOLN
30.0000 mg | Freq: Once | INTRAMUSCULAR | Status: AC
Start: 2018-06-02 — End: 2018-06-02
  Administered 2018-06-02: 30 mg via INTRAVENOUS
  Filled 2018-06-02: qty 1

## 2018-06-02 MED ORDER — ACETAMINOPHEN 500 MG PO TABS
1000.0000 mg | ORAL_TABLET | Freq: Once | ORAL | Status: AC
  Administered 2018-06-02: 1000 mg via ORAL
  Filled 2018-06-02: qty 2

## 2018-06-02 NOTE — ED Provider Notes (Signed)
[facility]-EMERGENCY DEPT Provider Note   CSN: [id number] Arrival date & time: 06/01/18  2202     History   Chief Complaint Chief Complaint  Patient presents with  . Fever  . Generalized Body Aches    HPI Oscar Burke is a 37 y.o. male.  Patient is a 37 year old male with past medical history of bipolar disorder, polysubstance abuse, hepatitis C.  He presents today for evaluation of fever and body aches.  This started approximately 2 days ago.  He reports cough that is productive of clear sputum.  He reports feeling generalized body aches and "hurts all over".  The history is provided by the patient.  Fever   This is a new problem. The current episode started 2 days ago. The problem occurs constantly. The problem has been rapidly worsening. The maximum temperature noted was 103 to 104 F. Associated symptoms include cough. Pertinent negatives include no diarrhea and no vomiting. He has tried nothing for the symptoms.    Past Medical History:  Diagnosis Date  . Bipolar 1 disorder (HCC)   . Chronic back pain   . Hepatitis C   . Polysubstance abuse (HCC)   . Smoker     Patient Active Problem List   Diagnosis Date Noted  . Polysubstance abuse (HCC) 04/07/2017  . Leukocytosis 04/07/2017  . Alcohol dependence with unspecified alcohol-induced disorder (HCC)   . Opiate overdose (HCC) 12/13/2013  . Encephalopathy acute 12/13/2013  . Acute respiratory failure (HCC) 12/13/2013    Past Surgical History:  Procedure Laterality Date  . ARM AMPUTATION    . MANDIBLE FRACTURE SURGERY          Home Medications    Prior to Admission medications   Medication Sig Start Date End Date Taking? Authorizing Provider  loperamide (IMODIUM) 2 MG capsule Take 1 capsule (2 mg total) by mouth 4 (four) times daily as needed for diarrhea or loose stools. 11/19/17   Caccavale, Sophia, PA-C  meloxicam (MOBIC) 7.5 MG tablet Take 1 tablet (7.5 mg total) by mouth  daily. Patient not taking: Reported on 11/19/2017 04/01/17   Cristina Gong, PA-C  ondansetron [facility]) 4 MG tablet Take 1 tablet (4 mg total) by mouth every 8 (eight) hours as needed for nausea or vomiting. 11/19/17   Caccavale, Sophia, PA-C  risperiDONE (RISPERDAL) 2 MG tablet Take 1 tablet (2 mg total) by mouth at bedtime. Patient not taking: Reported on 03/31/2017 07/24/14   Rolland Porter, MD  traMADol (ULTRAM) 50 MG tablet Take 1 tablet (50 mg total) by mouth every 6 (six) hours as needed. Patient not taking: Reported on 11/19/2017 10/02/17   Jacalyn Lefevre, MD    Family History Family History  Problem Relation Age of Onset  . Cancer Mother     Social History Social History   Tobacco Use  . Smoking status: Current Every Day Smoker    Types: Cigarettes  . Smokeless tobacco: Never Used  Substance Use Topics  . Alcohol use: No    Frequency: Never  . Drug use: Yes    Types: Cocaine, IV    Comment: Heroin, ice, crack, and morphine pills     Allergies   Patient has no known allergies.   Review of Systems Review of Systems  Constitutional: Positive for fever.  Respiratory: Positive for cough.   Gastrointestinal: Negative for diarrhea and vomiting.  All other systems reviewed and are negative.    Physical Exam Updated Vital Signs BP (!) 144/92 (BP Location: Right  Arm)   Pulse 97   Temp (!) 103.1 F (39.5 C) (Oral)   Resp 16   Ht 5\' 9"  (1.753 m)   Wt 63.5 kg   SpO2 96%   BMI 20.67 kg/m   Physical Exam  Constitutional: He is oriented to person, place, and time. He appears well-developed and well-nourished. No distress.  HENT:  Head: Normocephalic and atraumatic.  Mouth/Throat: Oropharynx is clear and moist.  Neck: Normal range of motion. Neck supple.  Cardiovascular: Normal rate and regular rhythm. Exam reveals no friction rub.  No murmur heard. Pulmonary/Chest: Effort normal and breath sounds normal. No respiratory distress. He has no wheezes. He has no  rales.  Abdominal: Soft. Bowel sounds are normal. He exhibits no distension. There is no tenderness.  Musculoskeletal: Normal range of motion. He exhibits no edema.  Lymphadenopathy:    He has no cervical adenopathy.  Neurological: He is alert and oriented to person, place, and time. Coordination normal.  Skin: Skin is warm and dry. He is not diaphoretic.  Nursing note and vitals reviewed.    ED Treatments / Results  Labs (all labs ordered are listed, but only abnormal results are displayed) Labs Reviewed  BASIC METABOLIC PANEL  CBC WITH DIFFERENTIAL/PLATELET  URINALYSIS, ROUTINE W REFLEX MICROSCOPIC  INFLUENZA PANEL BY PCR (TYPE A & B)    EKG None  Radiology No results found.  Procedures Procedures (including critical care time)  Medications Ordered in ED Medications  ketorolac (TORADOL) 30 MG/ML injection 30 mg (has no administration in time range)  acetaminophen (TYLENOL) tablet 1,000 mg (has no administration in time range)  acetaminophen (TYLENOL) tablet 650 mg (650 mg Oral Given 06/01/18 2252)     Initial Impression / Assessment and Plan / ED Course  I have reviewed the triage vital signs and the nursing notes.  Pertinent labs & imaging results that were available during my care of the patient were reviewed by me and considered in my medical decision making (see chart for details).  Patient presents with complaints of body aches, fever, and cough.  I highly suspect his symptoms are viral in nature.  His chest x-ray is clear and laboratory studies are reassuring.  He has a mild leukocytosis of 14,000 with no left shift.  Flu swab is negative and urinalysis is clear.  His temp was initially 103, however has improved to 101.3 with Tylenol.  I see no indication at this time for admission or antibiotics.  He will be discharged with rotating Tylenol and Motrin, plenty of fluids, and return as needed for any problems.  Final Clinical Impressions(s) / ED Diagnoses    Final diagnoses:  None    ED Discharge Orders    None       Geoffery Lyons, MD 06/02/18 [phone]

## 2018-06-02 NOTE — Discharge Instructions (Addendum)
Tylenol 1000 mg rotated with ibuprofen 600 mg every 4 hours as needed for fever.  Drink plenty of fluids and get plenty of rest.  Follow-up with your primary doctor if not improving in the next 2 to 3 days, and return to the ER if symptoms significantly worsen or change.

## 2018-06-08 ENCOUNTER — Encounter (HOSPITAL_COMMUNITY): Payer: Self-pay

## 2018-06-08 ENCOUNTER — Emergency Department (HOSPITAL_COMMUNITY): Payer: Self-pay

## 2018-06-08 ENCOUNTER — Other Ambulatory Visit: Payer: Self-pay

## 2018-06-08 ENCOUNTER — Inpatient Hospital Stay (HOSPITAL_COMMUNITY)
Admission: EM | Admit: 2018-06-08 | Discharge: 2018-06-21 | DRG: 871 | Discharge status: Against Medical Advice | Payer: Self-pay | Attending: Internal Medicine | Admitting: Internal Medicine

## 2018-06-08 ENCOUNTER — Inpatient Hospital Stay (HOSPITAL_COMMUNITY): Payer: Self-pay

## 2018-06-08 DIAGNOSIS — E871 Hypo-osmolality and hyponatremia: Secondary | ICD-10-CM | POA: Diagnosis present

## 2018-06-08 DIAGNOSIS — I079 Rheumatic tricuspid valve disease, unspecified: Secondary | ICD-10-CM | POA: Diagnosis present

## 2018-06-08 DIAGNOSIS — M25411 Effusion, right shoulder: Secondary | ICD-10-CM | POA: Diagnosis present

## 2018-06-08 DIAGNOSIS — I330 Acute and subacute infective endocarditis: Secondary | ICD-10-CM | POA: Diagnosis present

## 2018-06-08 DIAGNOSIS — F1110 Opioid abuse, uncomplicated: Secondary | ICD-10-CM | POA: Diagnosis present

## 2018-06-08 DIAGNOSIS — R7881 Bacteremia: Secondary | ICD-10-CM

## 2018-06-08 DIAGNOSIS — J15211 Pneumonia due to Methicillin susceptible Staphylococcus aureus: Secondary | ICD-10-CM | POA: Diagnosis present

## 2018-06-08 DIAGNOSIS — K029 Dental caries, unspecified: Secondary | ICD-10-CM | POA: Diagnosis present

## 2018-06-08 DIAGNOSIS — K089 Disorder of teeth and supporting structures, unspecified: Secondary | ICD-10-CM

## 2018-06-08 DIAGNOSIS — D509 Iron deficiency anemia, unspecified: Secondary | ICD-10-CM | POA: Diagnosis not present

## 2018-06-08 DIAGNOSIS — F1510 Other stimulant abuse, uncomplicated: Secondary | ICD-10-CM | POA: Diagnosis present

## 2018-06-08 DIAGNOSIS — J188 Other pneumonia, unspecified organism: Secondary | ICD-10-CM | POA: Diagnosis present

## 2018-06-08 DIAGNOSIS — R109 Unspecified abdominal pain: Secondary | ICD-10-CM | POA: Diagnosis present

## 2018-06-08 DIAGNOSIS — F319 Bipolar disorder, unspecified: Secondary | ICD-10-CM | POA: Diagnosis present

## 2018-06-08 DIAGNOSIS — J869 Pyothorax without fistula: Secondary | ICD-10-CM

## 2018-06-08 DIAGNOSIS — F419 Anxiety disorder, unspecified: Secondary | ICD-10-CM | POA: Diagnosis present

## 2018-06-08 DIAGNOSIS — M25511 Pain in right shoulder: Secondary | ICD-10-CM | POA: Diagnosis present

## 2018-06-08 DIAGNOSIS — I361 Nonrheumatic tricuspid (valve) insufficiency: Secondary | ICD-10-CM

## 2018-06-08 DIAGNOSIS — K5909 Other constipation: Secondary | ICD-10-CM | POA: Diagnosis present

## 2018-06-08 DIAGNOSIS — F1910 Other psychoactive substance abuse, uncomplicated: Secondary | ICD-10-CM | POA: Diagnosis present

## 2018-06-08 DIAGNOSIS — I368 Other nonrheumatic tricuspid valve disorders: Secondary | ICD-10-CM

## 2018-06-08 DIAGNOSIS — I2601 Septic pulmonary embolism with acute cor pulmonale: Secondary | ICD-10-CM | POA: Diagnosis present

## 2018-06-08 DIAGNOSIS — D696 Thrombocytopenia, unspecified: Secondary | ICD-10-CM | POA: Diagnosis present

## 2018-06-08 DIAGNOSIS — E860 Dehydration: Secondary | ICD-10-CM | POA: Diagnosis present

## 2018-06-08 DIAGNOSIS — F1029 Alcohol dependence with unspecified alcohol-induced disorder: Secondary | ICD-10-CM | POA: Diagnosis present

## 2018-06-08 DIAGNOSIS — R188 Other ascites: Secondary | ICD-10-CM | POA: Diagnosis present

## 2018-06-08 DIAGNOSIS — J189 Pneumonia, unspecified organism: Secondary | ICD-10-CM | POA: Diagnosis present

## 2018-06-08 DIAGNOSIS — I76 Septic arterial embolism: Secondary | ICD-10-CM | POA: Diagnosis present

## 2018-06-08 DIAGNOSIS — F1010 Alcohol abuse, uncomplicated: Secondary | ICD-10-CM | POA: Diagnosis present

## 2018-06-08 DIAGNOSIS — Z720 Tobacco use: Secondary | ICD-10-CM | POA: Diagnosis present

## 2018-06-08 DIAGNOSIS — A4101 Sepsis due to Methicillin susceptible Staphylococcus aureus: Principal | ICD-10-CM | POA: Diagnosis present

## 2018-06-08 DIAGNOSIS — J9621 Acute and chronic respiratory failure with hypoxia: Secondary | ICD-10-CM

## 2018-06-08 DIAGNOSIS — R0902 Hypoxemia: Secondary | ICD-10-CM

## 2018-06-08 DIAGNOSIS — K045 Chronic apical periodontitis: Secondary | ICD-10-CM | POA: Diagnosis present

## 2018-06-08 DIAGNOSIS — F1994 Other psychoactive substance use, unspecified with psychoactive substance-induced mood disorder: Secondary | ICD-10-CM

## 2018-06-08 DIAGNOSIS — I071 Rheumatic tricuspid insufficiency: Secondary | ICD-10-CM | POA: Diagnosis present

## 2018-06-08 DIAGNOSIS — J90 Pleural effusion, not elsewhere classified: Secondary | ICD-10-CM | POA: Diagnosis present

## 2018-06-08 DIAGNOSIS — B182 Chronic viral hepatitis C: Secondary | ICD-10-CM | POA: Diagnosis present

## 2018-06-08 DIAGNOSIS — F17210 Nicotine dependence, cigarettes, uncomplicated: Secondary | ICD-10-CM | POA: Diagnosis present

## 2018-06-08 LAB — URINALYSIS, ROUTINE W REFLEX MICROSCOPIC
Bilirubin Urine: NEGATIVE
Glucose, UA: NEGATIVE mg/dL
Ketones, ur: NEGATIVE mg/dL
Leukocytes, UA: NEGATIVE
Nitrite: NEGATIVE
Protein, ur: 30 mg/dL — AB
Specific Gravity, Urine: 1.017 (ref 1.005–1.030)
pH: 5 (ref 5.0–8.0)

## 2018-06-08 LAB — COMPREHENSIVE METABOLIC PANEL
ALT: 87 U/L — ABNORMAL HIGH (ref 0–44)
AST: 191 U/L — ABNORMAL HIGH (ref 15–41)
Albumin: 2.2 g/dL — ABNORMAL LOW (ref 3.5–5.0)
Alkaline Phosphatase: 122 U/L (ref 38–126)
Anion gap: 15 (ref 5–15)
BUN: 22 mg/dL — ABNORMAL HIGH (ref 6–20)
CO2: 29 mmol/L (ref 22–32)
Calcium: 8.3 mg/dL — ABNORMAL LOW (ref 8.9–10.3)
Chloride: 82 mmol/L — ABNORMAL LOW (ref 98–111)
Creatinine, Ser: 0.91 mg/dL (ref 0.61–1.24)
GFR calc Af Amer: >60 mL/min (ref >60)
GFR calc non Af Amer: >60 mL/min (ref >60)
Glucose, Bld: 96 mg/dL (ref 70–99)
Potassium: 3.8 mmol/L (ref 3.5–5.1)
Sodium: 126 mmol/L — ABNORMAL LOW (ref 135–145)
Total Bilirubin: 3.9 mg/dL — ABNORMAL HIGH (ref 0.3–1.2)
Total Protein: 6.1 g/dL — ABNORMAL LOW (ref 6.5–8.1)

## 2018-06-08 LAB — CBC WITH DIFFERENTIAL/PLATELET
Basophils Absolute: 0 10*3/uL (ref 0.0–0.1)
Basophils Relative: 0 %
Eosinophils Absolute: 0 10*3/uL (ref 0.0–0.7)
Eosinophils Relative: 0 %
HCT: 35.9 % — ABNORMAL LOW (ref 39.0–52.0)
Hemoglobin: 12.4 g/dL — ABNORMAL LOW (ref 13.0–17.0)
Lymphocytes Relative: 3 %
Lymphs Abs: 0.6 10*3/uL (ref 0.7–4.0)
MCH: 29.3 pg (ref 26.0–34.0)
MCHC: 34.5 g/dL (ref 30.0–36.0)
MCV: 84.9 fL (ref 78.0–100.0)
Monocytes Absolute: 1.8 10*3/uL (ref 0.1–1.0)
Monocytes Relative: 8 %
Neutro Abs: 18.8 10*3/uL (ref 1.7–7.7)
Neutrophils Relative %: 89 %
Platelets: 103 10*3/uL — ABNORMAL LOW (ref 150–400)
RBC: 4.23 MIL/uL (ref 4.22–5.81)
RDW: 13.7 % (ref 11.5–15.5)
WBC: 21.2 10*3/uL — ABNORMAL HIGH (ref 4.0–10.5)

## 2018-06-08 LAB — RAPID URINE DRUG SCREEN, HOSP PERFORMED
Amphetamines: NOT DETECTED
Barbiturates: NOT DETECTED
Benzodiazepines: NOT DETECTED
Cocaine: NOT DETECTED
Opiates: POSITIVE — AB
Tetrahydrocannabinol: NOT DETECTED

## 2018-06-08 LAB — BLOOD CULTURE ID PANEL (REFLEXED)

## 2018-06-08 LAB — STREP PNEUMONIAE URINARY ANTIGEN: STREP PNEUMO URINARY ANTIGEN: NEGATIVE

## 2018-06-08 LAB — EXPECTORATED SPUTUM ASSESSMENT W GRAM STAIN, RFLX TO RESP C

## 2018-06-08 LAB — I-STAT CG4 LACTIC ACID, ED: Lactic Acid, Venous: 1.39 mmol/L (ref 0.5–1.9)

## 2018-06-08 LAB — ECHOCARDIOGRAM COMPLETE
Height: 71 in
Weight: 2560 oz

## 2018-06-08 LAB — MRSA PCR SCREENING: MRSA by PCR: NEGATIVE

## 2018-06-08 LAB — LIPASE, BLOOD: Lipase: 29 U/L (ref 11–51)

## 2018-06-08 LAB — EXPECTORATED SPUTUM ASSESSMENT W REFEX TO RESP CULTURE

## 2018-06-08 MED ORDER — ONDANSETRON HCL 4 MG/2ML IJ SOLN
4.0000 mg | Freq: Once | INTRAMUSCULAR | Status: AC
  Administered 2018-06-08: 4 mg via INTRAVENOUS
  Filled 2018-06-08: qty 2

## 2018-06-08 MED ORDER — IOPAMIDOL (ISOVUE-300) INJECTION 61%
INTRAVENOUS | Status: AC
  Administered 2018-06-08: 100 mL via INTRAVENOUS
  Filled 2018-06-08: qty 100

## 2018-06-08 MED ORDER — IOPAMIDOL (ISOVUE-300) INJECTION 61%
100.0000 mL | Freq: Once | INTRAVENOUS | Status: AC | PRN
  Administered 2018-06-08: 100 mL via INTRAVENOUS

## 2018-06-08 MED ORDER — MORPHINE SULFATE (PF) 4 MG/ML IV SOLN
4.0000 mg | Freq: Once | INTRAVENOUS | Status: AC
  Administered 2018-06-08: 4 mg via INTRAVENOUS
  Filled 2018-06-08: qty 1

## 2018-06-08 MED ORDER — ONDANSETRON HCL 4 MG/2ML IJ SOLN: 4.0000 mg | Freq: Four times a day (QID) | INTRAMUSCULAR | Status: DC | PRN

## 2018-06-08 MED ORDER — ACETAMINOPHEN 325 MG PO TABS
650.0000 mg | ORAL_TABLET | Freq: Four times a day (QID) | ORAL | Status: DC | PRN
  Administered 2018-06-09 – 2018-06-17 (×14): 650 mg via ORAL
  Filled 2018-06-08 (×14): qty 2

## 2018-06-08 MED ORDER — ACETAMINOPHEN 650 MG RE SUPP: 650.0000 mg | Freq: Four times a day (QID) | RECTAL | Status: DC | PRN

## 2018-06-08 MED ORDER — ONDANSETRON HCL 4 MG PO TABS: 4.0000 mg | ORAL_TABLET | Freq: Four times a day (QID) | ORAL | Status: DC | PRN

## 2018-06-08 MED ORDER — BUPRENORPHINE HCL-NALOXONE HCL 2-0.5 MG SL SUBL: 2.0000 | SUBLINGUAL_TABLET | SUBLINGUAL | Status: AC | PRN

## 2018-06-08 MED ORDER — BUPRENORPHINE HCL-NALOXONE HCL 8-2 MG SL SUBL
1.0000 | SUBLINGUAL_TABLET | Freq: Two times a day (BID) | SUBLINGUAL | Status: DC
  Administered 2018-06-10 – 2018-06-11 (×4): 1 via SUBLINGUAL
  Filled 2018-06-08 (×4): qty 1

## 2018-06-08 MED ORDER — VANCOMYCIN HCL IN DEXTROSE 750-5 MG/150ML-% IV SOLN
750.0000 mg | Freq: Three times a day (TID) | INTRAVENOUS | Status: DC
  Administered 2018-06-08 – 2018-06-09 (×3): 750 mg via INTRAVENOUS
  Filled 2018-06-08 (×3): qty 150

## 2018-06-08 MED ORDER — TRAMADOL HCL 50 MG PO TABS
100.0000 mg | ORAL_TABLET | Freq: Four times a day (QID) | ORAL | Status: DC | PRN
  Administered 2018-06-08 – 2018-06-21 (×33): 100 mg via ORAL
  Filled 2018-06-08 (×34): qty 2

## 2018-06-08 MED ORDER — SODIUM CHLORIDE 0.9 % IV SOLN
1.0000 g | Freq: Three times a day (TID) | INTRAVENOUS | Status: DC
  Administered 2018-06-08 – 2018-06-09 (×3): 1 g via INTRAVENOUS
  Filled 2018-06-08 (×5): qty 1

## 2018-06-08 MED ORDER — SODIUM CHLORIDE 0.9 % IV SOLN
2.0000 g | Freq: Once | INTRAVENOUS | Status: AC
  Administered 2018-06-08: 2 g via INTRAVENOUS
  Filled 2018-06-08: qty 2

## 2018-06-08 MED ORDER — ACETAMINOPHEN 325 MG PO TABS
650.0000 mg | ORAL_TABLET | Freq: Once | ORAL | Status: AC
  Administered 2018-06-08: 650 mg via ORAL
  Filled 2018-06-08: qty 2

## 2018-06-08 MED ORDER — RISPERIDONE 2 MG PO TABS
2.0000 mg | ORAL_TABLET | Freq: Every day | ORAL | Status: DC
  Administered 2018-06-08: 2 mg via ORAL
  Filled 2018-06-08 (×2): qty 1

## 2018-06-08 MED ORDER — SODIUM CHLORIDE 0.9 % IV SOLN
INTRAVENOUS | Status: DC
  Administered 2018-06-08 – 2018-06-10 (×3): via INTRAVENOUS

## 2018-06-08 MED ORDER — VANCOMYCIN HCL 10 G IV SOLR
1500.0000 mg | Freq: Once | INTRAVENOUS | Status: AC
  Administered 2018-06-08: 1500 mg via INTRAVENOUS
  Filled 2018-06-08: qty 1500

## 2018-06-08 MED ORDER — SODIUM CHLORIDE 0.9 % IV BOLUS
1000.0000 mL | Freq: Once | INTRAVENOUS | Status: AC
  Administered 2018-06-08: 1000 mL via INTRAVENOUS

## 2018-06-08 MED ORDER — MORPHINE SULFATE (PF) 4 MG/ML IV SOLN: 4.0000 mg | Freq: Once | INTRAVENOUS | Status: DC

## 2018-06-08 MED ORDER — CLONAZEPAM 0.5 MG PO TABS
0.5000 mg | ORAL_TABLET | Freq: Three times a day (TID) | ORAL | Status: DC | PRN
  Administered 2018-06-08 – 2018-06-21 (×14): 0.5 mg via ORAL
  Filled 2018-06-08 (×14): qty 1

## 2018-06-08 MED ORDER — SODIUM CHLORIDE 0.9 % IJ SOLN
INTRAMUSCULAR | Status: AC
  Administered 2018-06-08: 10:00:00
  Filled 2018-06-08: qty 50

## 2018-06-08 MED ORDER — METRONIDAZOLE IN NACL 5-0.79 MG/ML-% IV SOLN
500.0000 mg | Freq: Three times a day (TID) | INTRAVENOUS | Status: DC
  Administered 2018-06-08 (×2): 500 mg via INTRAVENOUS
  Filled 2018-06-08 (×2): qty 100

## 2018-06-08 MED ORDER — VANCOMYCIN HCL IN DEXTROSE 1-5 GM/200ML-% IV SOLN: 1000.0000 mg | Freq: Once | INTRAVENOUS | Status: DC

## 2018-06-08 NOTE — ED Provider Notes (Signed)
[facility]-EMERGENCY DEPT Provider Note   CSN: [id number] Arrival date & time: 06/08/18  0705     History   Chief Complaint Chief Complaint  Patient presents with  . Abdominal Pain  . Code Sepsis    HPI Oscar Burke is a 37 y.o. male with history of bipolar 1 disorder, hepatitis C, polysubstance abuse who presents with abdominal pain.  Patient reports he has been having pain for about a week.  He was seen on 06/02/2018 for leg symptoms and he states he has gotten worse since then.  He denies any nausea, abdomen, or symptoms.  He is unable to describe his pain.  It is constant.  Patient related to a he also reports a 2-day history of ulcer on the bottom of his right foot.  Patient admits to using heroin weekly and last used about a week ago.  HPI  Past Medical History:  Diagnosis Date  . Bipolar 1 disorder (HCC)   . Chronic back pain   . Hepatitis C   . Polysubstance abuse (HCC)   . Smoker     Patient Active Problem List   Diagnosis Date Noted  . Abdominal pain 06/08/2018  . Hepatitis C, chronic (HCC) 06/08/2018  . Endocarditis 06/08/2018  . Septic embolism (HCC) 06/08/2018  . Multifocal pneumonia 06/08/2018  . Polysubstance abuse (HCC) 04/07/2017  . Leukocytosis 04/07/2017  . Alcohol dependence with unspecified alcohol-induced disorder (HCC)   . Opiate overdose (HCC) 12/13/2013  . Encephalopathy acute 12/13/2013  . Acute respiratory failure (HCC) 12/13/2013    Past Surgical History:  Procedure Laterality Date  . ARM AMPUTATION    . MANDIBLE FRACTURE SURGERY          Home Medications    Prior to Admission medications   Medication Sig Start Date End Date Taking? Authorizing Provider  loperamide (IMODIUM) 2 MG capsule Take 1 capsule (2 mg total) by mouth 4 (four) times daily as needed for diarrhea or loose stools. Patient not taking: Reported on 06/02/2018 11/19/17   Caccavale, Sophia, PA-C  meloxicam (MOBIC) 7.5 MG tablet Take 1 tablet  (7.5 mg total) by mouth daily. Patient not taking: Reported on 11/19/2017 04/01/17   Cristina Gong, PA-C  ondansetron (ZOFRAN) 4 MG tablet Take 1 tablet (4 mg total) by mouth every 8 (eight) hours as needed for nausea or vomiting. Patient not taking: Reported on 06/02/2018 11/19/17   Caccavale, Sophia, PA-C  risperiDONE (RISPERDAL) 2 MG tablet Take 1 tablet (2 mg total) by mouth at bedtime. Patient not taking: Reported on 03/31/2017 07/24/14   Rolland Porter, MD  traMADol (ULTRAM) 50 MG tablet Take 1 tablet (50 mg total) by mouth every 6 (six) hours as needed. Patient not taking: Reported on 11/19/2017 10/02/17   Jacalyn Lefevre, MD    Family History Family History  Problem Relation Age of Onset  . Cancer Mother     Social History Social History   Tobacco Use  . Smoking status: Current Every Day Smoker    Packs/day: 1.00    Types: Cigarettes  . Smokeless tobacco: Never Used  Substance Use Topics  . Alcohol use: No    Frequency: Never  . Drug use: Yes    Types: Cocaine, IV    Comment: Heroin, ice, crack, and morphine pills     Allergies   Patient has no known allergies.   Review of Systems Review of Systems  Constitutional: Negative for chills and fever.  HENT: Negative for facial swelling and sore  throat.   Respiratory: Positive for shortness of breath.   Cardiovascular: Negative for chest pain.  Gastrointestinal: Positive for abdominal pain. Negative for blood in stool, diarrhea, nausea and vomiting.  Genitourinary: Negative for dysuria.  Musculoskeletal: Negative for back pain.  Skin: Positive for rash. Negative for wound.  Neurological: Negative for headaches.  Psychiatric/Behavioral: The patient is not nervous/anxious.      Physical Exam Updated Vital Signs BP 106/69   Pulse (!) 101   Temp (S) (!) 103.5 F (39.7 C) (Rectal)   Resp (!) 25   Ht 5\' 11"  (1.803 m)   Wt 72.6 kg   SpO2 96%   BMI 22.32 kg/m   Physical Exam  Constitutional: He appears  well-developed and well-nourished. No distress.  HENT:  Head: Normocephalic and atraumatic.  Mouth/Throat: Oropharynx is clear and moist. No oropharyngeal exudate.  Eyes: Pupils are equal, round, and reactive to light. Conjunctivae are normal. Right eye exhibits no discharge. Left eye exhibits no discharge. No scleral icterus.  Neck: Normal range of motion. Neck supple. No thyromegaly present.  Cardiovascular: Regular rhythm, normal heart sounds and intact distal pulses. Tachycardia present. Exam reveals no gallop and no friction rub.  No murmur heard. Pulmonary/Chest: Breath sounds normal. No stridor. Tachypnea noted. No respiratory distress. He has no wheezes. He has no rales.  Abdominal: Soft. Bowel sounds are normal. He exhibits no distension. There is tenderness in the right lower quadrant, epigastric area and periumbilical area. There is no rebound and no guarding.  Musculoskeletal: He exhibits no edema.  Lymphadenopathy:    He has no cervical adenopathy.  Neurological: He is alert. Coordination normal.  Skin: Skin is warm and dry. No rash noted. He is not diaphoretic. No pallor.  Nonblanching, petechial rash around bilateral ankles 2 cm in diameter fluctuant area on the bottom of the right foot with tenderness, no erythema or drainage  Psychiatric: He has a normal mood and affect.  Nursing note and vitals reviewed.    ED Treatments / Results  Labs (all labs ordered are listed, but only abnormal results are displayed) Labs Reviewed  COMPREHENSIVE METABOLIC PANEL - Abnormal; Notable for the following components:      Result Value   Sodium 126 (*)    Chloride 82 (*)    BUN 22 (*)    Calcium 8.3 (*)    Total Protein 6.1 (*)    Albumin 2.2 (*)    AST 191 (*)    ALT 87 (*)    Total Bilirubin 3.9 (*)    All other components within normal limits  CBC WITH DIFFERENTIAL/PLATELET - Abnormal; Notable for the following components:   WBC 21.2 (*)    Hemoglobin 12.4 (*)    HCT 35.9  (*)    Platelets 103 (*)    All other components within normal limits  URINALYSIS, ROUTINE W REFLEX MICROSCOPIC - Abnormal; Notable for the following components:   Color, Urine AMBER (*)    APPearance HAZY (*)    Hgb urine dipstick MODERATE (*)    Protein, ur 30 (*)    Bacteria, UA RARE (*)    All other components within normal limits  RAPID URINE DRUG SCREEN, HOSP PERFORMED - Abnormal; Notable for the following components:   Opiates POSITIVE (*)    All other components within normal limits  CULTURE, BLOOD (ROUTINE X 2)  CULTURE, BLOOD (ROUTINE X 2)  LIPASE, BLOOD  HIV ANTIBODY (ROUTINE TESTING W REFLEX)  HEPATITIS PANEL, ACUTE  I-STAT CG4 LACTIC  ACID, ED    EKG EKG Interpretation  Date/Time:  Wednesday June 08 2018 07:58:25 EDT Ventricular Rate:  113 PR Interval:    QRS Duration: 103 QT Interval:  367 QTC Calculation: 504 R Axis:   80 Text Interpretation:  Sinus tachycardia Probable left atrial enlargement Prolonged QT interval Baseline wander in lead(s) V3 Since last tracing rate faster Confirmed by Linwood Dibbles [phone]) on 06/08/2018 8:13:34 AM Also confirmed by Linwood Dibbles [id number]), editor Sheppard Evens ([id number])  on 06/08/2018 8:31:50 AM   Radiology Dg Chest 2 View  Result Date: 06/08/2018 CLINICAL DATA:  Upper abdominal pain and tightness, poor appetite, chest pain with shortness of breath EXAM: CHEST - 2 VIEW COMPARISON:  Chest x-ray of 06/02/2018 and 11/19/2016 FINDINGS: There is now increased opacity at the left lung and mid left lung as well as the medial left apex suspicious for patchy pneumonia. There may be minimal involvement of the right lung base as well and follow-up is recommended. There is a small left pleural effusion present. Some fluid tracks into the major fissure on the lateral view. Mediastinal and hilar contours are unremarkable and the heart is minimally enlarged. No bony abnormality is seen. IMPRESSION: 1. Interval development of patchy opacities  particularly at the left lung base, left mid lung and left lung apex with perhaps mild involvement of the right lung base, suspicious for multifocal pneumonia. Recommend follow-up chest x-ray. 2. Small left pleural effusion. Electronically Signed   By: Dwyane Dee M.D.   On: 06/08/2018 08:37   Ct Abdomen Pelvis W Contrast  Result Date: 06/08/2018 CLINICAL DATA:  Epigastric pain. Right lower quadrant pain and tenderness. Abdominal pain, appendicitis suspected. Acute pain, generalized. EXAM: CT ABDOMEN AND PELVIS WITH CONTRAST TECHNIQUE: Multidetector CT imaging of the abdomen and pelvis was performed using the standard protocol following bolus administration of intravenous contrast. CONTRAST:  ISOVUE-300 IOPAMIDOL (ISOVUE-300) INJECTION 61% COMPARISON:  One-view abdomen 12/13/2013 FINDINGS: Lower chest: Multiple poorly marginated cavitary pulmonary nodules are present in the lower lobes bilaterally. Left greater than right pleural effusion is present with associated atelectasis. There is no pneumothorax or greater consolidation. The heart size is normal. Hepatobiliary: A 9 mm simple cyst is present in the anterior right lobe of the liver. No other focal lesions are present. There is no ductal dilation. The gallbladder is normal. Pancreas: Unremarkable. No pancreatic ductal dilatation or surrounding inflammatory changes. Spleen: Mild splenomegaly is noted. No discrete lesions are evident. Adrenals/Urinary Tract: Adrenal glands are normal bilaterally. Kidneys and ureters are within normal limits. Gallbladder is normal. Stomach/Bowel: Stomach and duodenum are within normal limits. Small bowel is unremarkable. Terminal ileum is visualized and within normal limits. The appendix is visualized and normal. Ascending transverse colon are normal. Descending and sigmoid colon are within normal limits. Vascular/Lymphatic: No significant vascular findings are present. No enlarged abdominal or pelvic lymph nodes.  Reproductive: Prostate is unremarkable. Other: No abdominal wall hernia or abnormality. No abdominopelvic ascites. Musculoskeletal: Bilateral L2 pars defects are present. Alignment is anatomic. Vertebral body heights are normal. No focal lytic or blastic lesions are present. Pelvis is intact. Hips are located and within normal limits bilaterally. IMPRESSION: 1. Multiple poorly marginated cavitary pulmonary nodules in the lower lobes bilaterally. Given the history of polysubstance abuse, this most likely reflects septic emboli. Other atypical infection or malignancy is considered much less likely. 2. No acute or focal abnormality in the abdomen to explain abdominal pain. 3. Mild splenomegaly without focal lesion. Electronically Signed   By: Cristal Deer  Mattern M.D.   On: 06/08/2018 10:11    Procedures Procedures (including critical care time)  CRITICAL CARE Performed by: Emi Holes   Total critical care time: 45 minutes  Critical care time was exclusive of separately billable procedures and treating other patients.  Critical care was necessary to treat or prevent imminent or life-threatening deterioration.  Critical care was time spent personally by me on the following activities: development of treatment plan with patient and/or surrogate as well as nursing, discussions with consultants, evaluation of patient's response to treatment, examination of patient, obtaining history from patient or surrogate, ordering and performing treatments and interventions, ordering and review of laboratory studies, ordering and review of radiographic studies, pulse oximetry and re-evaluation of patient's condition.  EMERGENCY DEPARTMENT US SOFT TISSUE INTERPRETATION "Study: Limited Soft Tissue Ultrasound"  INDICATIONS: Soft tissue infection Multiple views of the body part were obtained in real-time with a multi-frequency linear probe  PERFORMED BY: Myself IMAGES ARCHIVED?: Yes SIDE:Right  BODY  PART:foot INTERPRETATION:  No abcess noted, No cellulitis noted and inflammatory tissue noted      Medications Ordered in ED Medications  metroNIDAZOLE (FLAGYL) IVPB 500 mg (0 mg Intravenous Stopped 06/08/18 1119)  risperiDONE (RISPERDAL) tablet 2 mg (has no administration in time range)  sodium chloride 0.9 % bolus 1,000 mL (0 mLs Intravenous Stopped 06/08/18 0915)  morphine 4 MG/ML injection 4 mg (4 mg Intravenous Given 06/08/18 0801)  ondansetron (ZOFRAN) injection 4 mg (4 mg Intravenous Given 06/08/18 0800)  ceFEPIme (MAXIPIME) 2 g in sodium chloride 0.9 % 100 mL IVPB (0 g Intravenous Stopped 06/08/18 0915)  acetaminophen (TYLENOL) tablet 650 mg (650 mg Oral Given 06/08/18 0833)  vancomycin (VANCOCIN) 1,500 mg in sodium chloride 0.9 % 500 mL IVPB (0 mg Intravenous Stopped 06/08/18 1222)  iopamidol (ISOVUE-300) 61 % injection 100 mL (100 mLs Intravenous Contrast Given 06/08/18 0916)  sodium chloride 0.9 % injection (  Given by Other 06/08/18 0940)  morphine 4 MG/ML injection 4 mg (4 mg Intravenous Given 06/08/18 1026)  sodium chloride 0.9 % bolus 1,000 mL (0 mLs Intravenous Stopped 06/08/18 1222)     Initial Impression / Assessment and Plan / ED Course  I have reviewed the triage vital signs and the nursing notes.  Pertinent labs & imaging results that were available during my care of the patient were reviewed by me and considered in my medical decision making (see chart for details).     Patient with endocarditis with septic emboli.  Code sepsis called as patient was febrile, tachypnea and tachycardic on arrival.  Broad-spectrum antibiotics and fluids initiated.  Initially complaining of abdominal pain and CT abdomen pelvis was conducted which showed probable septic emboli in the lower lung fields.  In the setting of patient's IV drug use, this is most likely.  Chest x-ray showed multi focal pneumonia.  CBC shows WBC 21.2, hemoglobin 12.4.  CMP shows sodium 126, chloride 82, BUN 22, AST 191,  ALT 87, total bilirubin 3.9.  Acute hepatitis panel sent, as well as HIV.  UA shows moderate hematuria, 30 protein.  UDS positive for opiates.  Blood cultures pending.  Lactic negative.  Bedside ultrasound did not show abscess of the area on patient's foot, more inflammatory tissue.  Suspect osler node.  Echocardiogram confirmed tricuspid vegetation.  Cardiology will schedule TEE for Friday, 06/10/2018.  I consulted Dr. Rito Ehrlich with [facility] who accepts patient for admission.  I appreciate his assistance with the patient.  Patient also evaluated by my attending, Dr.  Lynelle Doctor, who guided the patient's management and agrees with plan.  Final Clinical Impressions(s) / ED Diagnoses   Final diagnoses:  Acute bacterial endocarditis    ED Discharge Orders    None       Emi Holes, PA-C 06/08/18 1323    Shon Baton, MD 06/12/18 [phone]

## 2018-06-08 NOTE — ED Notes (Signed)
Report given to [facility] [place] [phone].

## 2018-06-08 NOTE — ED Notes (Signed)
Patient transported to X-ray 

## 2018-06-08 NOTE — ED Triage Notes (Signed)
Pt complains of abdominal tightness, denies vomiting ro diarrhea and he states he hasn't eaten in eight days

## 2018-06-08 NOTE — Progress Notes (Signed)
  Echocardiogram 2D Echocardiogram has been performed.  Reva Pinkley G Thor Nannini 06/08/2018, 11:17 AM

## 2018-06-08 NOTE — Progress Notes (Signed)
Pharmacy Antibiotic Note  Oscar Burke is a 37 y.o. male admitted on 06/08/2018 with sepsis.  Pharmacy has been consulted for Vancomycin dosing.  Chest x-ray noted possible multifocal pneumonia.  Given history of IV drug use and other presenting symptoms, suspicious for endocarditis.  Blood cultures drawn and patient started on broad-spectrum antibiotics.  Echocardiogram done noted positive vegetation on tricuspid valve.      Plan:  Vancomycin 1500 mg IV x1, then 750 mg IV q8h   Cefepime 1 gr IV q8h ( MD)   Monitor clinical course, renal function, cultures as available     Height: 5\' 11"  (180.3 cm) Weight: 160 lb (72.6 kg) IBW/kg (Calculated) : 75.3  Temp (24hrs), Avg:100.8 F (38.2 C), Min:98 F (36.7 C), Max:103.5 F (39.7 C)  Recent Labs  Lab 06/02/18 0608 06/08/18 0742 06/08/18 0750  WBC 14.2* 21.2*  --   CREATININE 0.67 0.91  --   LATICACIDVEN  --   --  1.39    Estimated Creatinine Clearance: 111.9 mL/min (by C-G formula based on SCr of 0.91 mg/dL).    No Known Allergies  Antimicrobials this admission: 10/2 vancomycin >>  10/2 cefepime >>   Dose adjustments this admission:    Microbiology results: 10/2 BCx:  10/2 HIV antibody:  10/3 Hepatitis panel:  10/3 Strep pneumo antigen:  10/3 Sputum culture:     Thank you for allowing pharmacy to be a part of this patient's care.   Adalberto Cole, PharmD, BCPS Pager [phone] 06/08/2018 2:55 PM

## 2018-06-08 NOTE — ED Notes (Signed)
ED TO INPATIENT HANDOFF REPORT  Name/Age/Gender Oscar Burke 37 y.o. male  Code Status    Code Status Orders  (From admission, onward)         Start     Ordered   06/08/18 1040  Full code  Continuous     06/08/18 1039        Code Status History    Date Active Date Inactive Code Status Order ID Comments User Context   04/07/2017 2257 04/08/2017 0335 Full Code [id number]  Vianne Bulls, MD ED   07/22/2014 0705 07/24/2014 1628 Full Code [id number]  Francine Graven, DO ED   12/13/2013 1024 12/18/2013 1402 Full Code [id number]  Germain Osgood, PA-C ED      Home/SNF/Other Home  Chief Complaint abdominal pain  Level of Care/Admitting Diagnosis ED Disposition    ED Disposition Condition [facility] Area: [facility] [[id number]]  Level of Care: ICU [6]  Diagnosis: Endocarditis [676195]  Admitting Physician: Junius Argyle  Attending Physician: Annita Brod [[id number]]  Estimated length of stay: past midnight tomorrow  Certification:: I certify this patient will need inpatient services for at least 2 midnights  PT Class (Do Not Modify): Inpatient [101]  PT Acc Code (Do Not Modify): Private [1]       Medical History Past Medical History:  Diagnosis Date  . Bipolar 1 disorder ([place])   . Chronic back pain   . Hepatitis C   . Polysubstance abuse ([place])   . Smoker     Allergies No Known Allergies  IV Location/Drains/Wounds Patient Lines/Drains/Airways Status   Active Line/Drains/Airways    Name:   Placement date:   Placement time:   Site:   Days:   Peripheral IV 06/08/18 Right;Upper Arm   06/08/18    0756    Arm   less than 1   Peripheral IV 06/08/18 Right Wrist   06/08/18    0754    Wrist   less than 1          Labs/Imaging Results for orders placed or performed during the hospital encounter of 06/08/18 (from the past 48 hour(s))  Comprehensive metabolic panel     Status: Abnormal   Collection Time: 06/08/18  7:42  AM  Result Value Ref Range   Sodium 126 (L) 135 - 145 mmol/L   Potassium 3.8 3.5 - 5.1 mmol/L   Chloride 82 (L) 98 - 111 mmol/L   CO2 29 22 - 32 mmol/L   Glucose, Bld 96 70 - 99 mg/dL   BUN 22 (H) 6 - 20 mg/dL   Creatinine, Ser 0.91 0.61 - 1.24 mg/dL   Calcium 8.3 (L) 8.9 - 10.3 mg/dL   Total Protein 6.1 (L) 6.5 - 8.1 g/dL   Albumin 2.2 (L) 3.5 - 5.0 g/dL   AST 191 (H) 15 - 41 U/L   ALT 87 (H) 0 - 44 U/L   Alkaline Phosphatase 122 38 - 126 U/L   Total Bilirubin 3.9 (H) 0.3 - 1.2 mg/dL   GFR calc non Af Amer >60 >60 mL/min   GFR calc Af Amer >60 >60 mL/min    Comment: (NOTE) The eGFR has been calculated using the CKD EPI equation. This calculation has not been validated in all clinical situations. eGFR's persistently <60 mL/min signify possible Chronic Kidney Disease.    Anion gap 15 5 - 15    Comment: Performed at [facility], [address] [place],  [place] [address]  Lipase, blood     Status: None   Collection Time: 06/08/18  7:42 AM  Result Value Ref Range   Lipase 29 11 - 51 U/L    Comment: Performed at [facility], [place] [address], [place], [place] [address]  CBC with Differential     Status: Abnormal   Collection Time: 06/08/18  7:42 AM  Result Value Ref Range   WBC 21.2 (H) 4.0 - 10.5 K/uL   RBC 4.23 4.22 - 5.81 MIL/uL   Hemoglobin 12.4 (L) 13.0 - 17.0 g/dL   HCT 35.9 (L) 39.0 - 52.0 %   MCV 84.9 78.0 - 100.0 fL   MCH 29.3 26.0 - 34.0 pg   MCHC 34.5 30.0 - 36.0 g/dL   RDW 13.7 11.5 - 15.5 %   Platelets 103 (L) 150 - 400 K/uL    Comment: REPEATED TO VERIFY SPECIMEN CHECKED FOR CLOTS PLATELET COUNT CONFIRMED BY SMEAR    Neutrophils Relative % 89 %   Neutro Abs 18.8 1.7 - 7.7 K/uL   Lymphocytes Relative 3 %   Lymphs Abs 0.6 0.7 - 4.0 K/uL   Monocytes Relative 8 %   Monocytes Absolute 1.8 0.1 - 1.0 K/uL   Eosinophils Relative 0 %   Eosinophils Absolute 0.0 0.0 - 0.7 K/uL   Basophils Relative 0 %   Basophils Absolute  0.0 0.0 - 0.1 K/uL   WBC Morphology MILD LEFT SHIFT (1-5% METAS, OCC MYELO, OCC BANDS)     Comment: DOHLE BODIES Performed at [facility], [place] [address], [place], [place] [address]   Blood Culture (routine x 2)     Status: None (Preliminary result)   Collection Time: 06/08/18  7:47 AM  Result Value Ref Range   Specimen Description      BLOOD RIGHT ARM Performed at [facility] [address], [place], [place] [address]    Special Requests      BOTTLES DRAWN AEROBIC AND ANAEROBIC Blood Culture results may not be optimal due to an excessive volume of blood received in culture bottles Performed at [facility] [address], [place], [place] [address]    Culture      NO GROWTH < 12 HOURS Performed at [facility] [address], [place], [place] [address]    Report Status PENDING   I-Stat CG4 Lactic Acid, ED  (not at  [facility])     Status: None   Collection Time: 06/08/18  7:50 AM  Result Value Ref Range   Lactic Acid, Venous 1.39 0.5 - 1.9 mmol/L  Blood Culture (routine x 2)     Status: None (Preliminary result)   Collection Time: 06/08/18  7:55 AM  Result Value Ref Range   Specimen Description      BLOOD RIGHT WRIST Performed at [facility] [address], [place], [place] [address]    Special Requests      BOTTLES DRAWN AEROBIC AND ANAEROBIC Blood Culture results may not be optimal due to an excessive volume of blood received in culture bottles Performed at [facility] [address], [place], [place] [address]    Culture      NO GROWTH < 12 HOURS Performed at [facility] [address] [place],  [address]    Report Status PENDING   Urinalysis, Routine w reflex microscopic     Status: Abnormal   Collection Time: 06/08/18  8:11 AM  Result Value Ref Range   Color, Urine AMBER (A) YELLOW    Comment: BIOCHEMICALS MAY BE AFFECTED BY COLOR   APPearance  HAZY (A) CLEAR   Specific Gravity, Urine 1.017 1.005 - 1.030   pH 5.0 5.0 - 8.0   Glucose, UA NEGATIVE NEGATIVE mg/dL   Hgb urine dipstick MODERATE (A) NEGATIVE   Bilirubin Urine NEGATIVE NEGATIVE   Ketones, ur NEGATIVE NEGATIVE mg/dL   Protein, ur 30 (A) NEGATIVE mg/dL   Nitrite NEGATIVE NEGATIVE   Leukocytes, UA NEGATIVE NEGATIVE   RBC / HPF 0-5 0 - 5 RBC/hpf   WBC, UA 6-10 0 - 5 WBC/hpf   Bacteria, UA RARE (A) NONE SEEN   Squamous Epithelial / LPF 0-5 0 - 5    Comment: Performed at [facility], [place] [address] [place], [place] [address]  Rapid urine drug screen (hospital performed)     Status: Abnormal   Collection Time: 06/08/18  8:11 AM  Result Value Ref Range   Opiates POSITIVE (A) NONE DETECTED   Cocaine NONE DETECTED NONE DETECTED   Benzodiazepines NONE DETECTED NONE DETECTED   Amphetamines NONE DETECTED NONE DETECTED   Tetrahydrocannabinol NONE DETECTED NONE DETECTED   Barbiturates NONE DETECTED NONE DETECTED    Comment: (NOTE) DRUG SCREEN FOR MEDICAL PURPOSES ONLY.  IF CONFIRMATION IS NEEDED FOR ANY PURPOSE, NOTIFY LAB WITHIN 5 DAYS. LOWEST DETECTABLE LIMITS FOR URINE DRUG SCREEN Drug Class                     Cutoff (ng/mL) Amphetamine and metabolites    1000 Barbiturate and metabolites    200 Benzodiazepine                 937 Tricyclics and metabolites     300 Opiates and metabolites        300 Cocaine and metabolites        300 THC                            50 Performed at [facility], [place] [address], [place], [place] [address]    Dg Chest 2 View  Result Date: 06/08/2018 CLINICAL DATA:  Upper abdominal pain and tightness, poor appetite, chest pain with shortness of breath EXAM: CHEST - 2 VIEW COMPARISON:  Chest x-ray of 06/02/2018 and 11/19/2016 FINDINGS: There is now increased opacity at the left lung and mid left lung as well as the medial left apex suspicious for patchy pneumonia. There may be minimal involvement  of the right lung base as well and follow-up is recommended. There is a small left pleural effusion present. Some fluid tracks into the major fissure on the lateral view. Mediastinal and hilar contours are unremarkable and the heart is minimally enlarged. No bony abnormality is seen. IMPRESSION: 1. Interval development of patchy opacities particularly at the left lung base, left mid lung and left lung apex with perhaps mild involvement of the right lung base, suspicious for multifocal pneumonia. Recommend follow-up chest x-ray. 2. Small left pleural effusion. Electronically Signed   By: Ivar Drape M.D.   On: 06/08/2018 08:37   Ct Abdomen Pelvis W Contrast  Result Date: 06/08/2018 CLINICAL DATA:  Epigastric pain. Right lower quadrant pain and tenderness. Abdominal pain, appendicitis suspected. Acute pain, generalized. EXAM: CT ABDOMEN AND PELVIS WITH CONTRAST TECHNIQUE: Multidetector CT imaging of the abdomen and pelvis was performed using the standard protocol following bolus administration  of intravenous contrast. CONTRAST:  18m ISOVUE-300 IOPAMIDOL (ISOVUE-300) INJECTION 61% COMPARISON:  One-view abdomen 12/13/2013 FINDINGS: Lower chest: Multiple poorly marginated cavitary pulmonary nodules are present in the lower lobes bilaterally. Left greater than right pleural effusion is present with associated atelectasis. There is no pneumothorax or greater consolidation. The heart size is normal. Hepatobiliary: A 9 mm simple cyst is present in the anterior right lobe of the liver. No other focal lesions are present. There is no ductal dilation. The gallbladder is normal. Pancreas: Unremarkable. No pancreatic ductal dilatation or surrounding inflammatory changes. Spleen: Mild splenomegaly is noted. No discrete lesions are evident. Adrenals/Urinary Tract: Adrenal glands are normal bilaterally. Kidneys and ureters are within normal limits. Gallbladder is normal. Stomach/Bowel: Stomach and duodenum are within normal  limits. Small bowel is unremarkable. Terminal ileum is visualized and within normal limits. The appendix is visualized and normal. Ascending transverse colon are normal. Descending and sigmoid colon are within normal limits. Vascular/Lymphatic: No significant vascular findings are present. No enlarged abdominal or pelvic lymph nodes. Reproductive: Prostate is unremarkable. Other: No abdominal wall hernia or abnormality. No abdominopelvic ascites. Musculoskeletal: Bilateral L2 pars defects are present. Alignment is anatomic. Vertebral body heights are normal. No focal lytic or blastic lesions are present. Pelvis is intact. Hips are located and within normal limits bilaterally. IMPRESSION: 1. Multiple poorly marginated cavitary pulmonary nodules in the lower lobes bilaterally. Given the history of polysubstance abuse, this most likely reflects septic emboli. Other atypical infection or malignancy is considered much less likely. 2. No acute or focal abnormality in the abdomen to explain abdominal pain. 3. Mild splenomegaly without focal lesion. Electronically Signed   By: CSan MorelleM.D.   On: 06/08/2018 10:11    Pending Labs Unresulted Labs (From admission, onward)    Start     Ordered   06/08/18 0934  HIV antibody  (STI Panel)  Once,   STAT     06/08/18 0933   06/08/18 0934  Hepatitis panel, acute  STAT,   STAT     06/08/18 0933   Signed and Held  Culture, sputum-assessment  Once,   R     Signed and Held   Signed and Held  Gram stain  Once,   R     Signed and Held   Signed and Held  Strep pneumoniae urinary antigen  Once,   R     Signed and Held   Signed and Held  Basic metabolic panel  Tomorrow morning,   R     Signed and Held   Signed and Held  CBC  Tomorrow morning,   R     Signed and Held          Vitals/Pain Today's Vitals   06/08/18 1303 06/08/18 1330 06/08/18 1400 06/08/18 1430  BP:  99/73 112/71 114/80  Pulse:  (!) 102 (!) 105 (!) 110  Resp:  (!) 26 (!) 28 (!) 35   Temp:      TempSrc:      SpO2:  91% 95% 95%  Weight:      Height:      PainSc: Asleep       Isolation Precautions No active isolations  Medications Medications  metroNIDAZOLE (FLAGYL) IVPB 500 mg (0 mg Intravenous Stopped 06/08/18 1119)  risperiDONE (RISPERDAL) tablet 2 mg (has no administration in time range)  ceFEPIme (MAXIPIME) 1 g in sodium chloride 0.9 % 100 mL IVPB (has no administration in time range)  sodium chloride 0.9 % bolus 1,000  mL (0 mLs Intravenous Stopped 06/08/18 0915)  morphine 4 MG/ML injection 4 mg (4 mg Intravenous Given 06/08/18 0801)  ondansetron (ZOFRAN) injection 4 mg (4 mg Intravenous Given 06/08/18 0800)  ceFEPIme (MAXIPIME) 2 g in sodium chloride 0.9 % 100 mL IVPB (0 g Intravenous Stopped 06/08/18 0915)  acetaminophen (TYLENOL) tablet 650 mg (650 mg Oral Given 06/08/18 0833)  vancomycin (VANCOCIN) 1,500 mg in sodium chloride 0.9 % 500 mL IVPB (0 mg Intravenous Stopped 06/08/18 1222)  iopamidol (ISOVUE-300) 61 % injection 100 mL (100 mLs Intravenous Contrast Given 06/08/18 0916)  sodium chloride 0.9 % injection (  Given by Other 06/08/18 0940)  morphine 4 MG/ML injection 4 mg (4 mg Intravenous Given 06/08/18 1026)  sodium chloride 0.9 % bolus 1,000 mL (0 mLs Intravenous Stopped 06/08/18 1222)  acetaminophen (TYLENOL) tablet 650 mg (650 mg Oral Given 06/08/18 1422)    Mobility walks

## 2018-06-08 NOTE — ED Provider Notes (Signed)
Medical screening examination/treatment/procedure(s) were conducted as a shared visit with non-physician practitioner(s) and myself.  I personally evaluated the patient during the encounter.  EKG Interpretation  Date/Time:  Wednesday June 08 2018 07:58:25 EDT Ventricular Rate:  113 PR Interval:    QRS Duration: 103 QT Interval:  367 QTC Calculation: 504 R Axis:   80 Text Interpretation:  Sinus tachycardia Probable left atrial enlargement Prolonged QT interval Baseline wander in lead(s) V3 Since last tracing rate faster Confirmed by Linwood Dibbles [phone]) on 06/08/2018 8:13:34 AM Also confirmed by Linwood Dibbles [phone]), editor Sheppard Evens ([id number])  on 06/08/2018 8:31:50 AM  Patient presented to the emergency room with complaints of fever and body aches.  Patient also mentioned a productive cough.  Patient has a history of intravenous heroin abuse.  Patient was noted to have a fever of 103.  On exam he has a faint systolic murmur.  He also has a petechial rash in his lower extremities.  He is not hypotensive and does not have a lactic acidosis.  Initial chest x-ray is suggestive of a multifocal pneumonia.  His symptoms are also concerning for the possibility of endocarditis.  Stat echocardiogram has been ordered.  Patient has been started on broad-spectrum antibiotics and will consult the medical service for admission.   Linwood Dibbles, MD 06/08/18 [phone]

## 2018-06-08 NOTE — Progress Notes (Signed)
A consult was received from an ED physician for vancomycin + cefepime per pharmacy dosing.  The patient's profile has been reviewed for ht/wt/allergies/indication/available labs.   A one time order has been placed for cefepime 2 g and vancomycin 1500 mg IV once.  Further antibiotics/pharmacy consults should be ordered by admitting physician if indicated.                       Thank you, Cindi Carbon, PharmD 06/08/2018  8:36 AM

## 2018-06-08 NOTE — H&P (Signed)
History and Physical  [name] ZOX:[id number] DOB: [birth year] DOA: 06/08/2018  Referring physician: Linwood Dibbles, ER physician PCP: Patient, No Pcp Per  Outpatient Specialists: None Patient coming from: Home & is able to ambulate without assistance  Chief Complaint: Generalized aches, fever  HPI: Oscar Burke is a 37 y.o. male with medical history significant for tobacco abuse, hepatitis C and IV heroin use who presented to the emergency room with complaints of fever and body aches that been going on for several days.  He also complained of a mild cough with mild whitish sputum.  He also complained of vague abdominal pain noted to be somewhat tachypneic.   ED Course: In the emergency room, patient was noted to have a fever of 103 as well as a petechial rash on his lower extremities.  Given complaints of abdominal pain, he underwent an abdominal CT which was unremarkable in the abdomen, but did note multiple poorly marginated cavitary pulmonary nodules in the lower lobes bilaterally.  Chest x-ray noted possible multifocal pneumonia.  Given history of IV drug use and other presenting symptoms, suspicious for endocarditis.  Blood cultures drawn and patient started on broad-spectrum antibiotics.  Echocardiogram done noted positive vegetation on tricuspid valve.  Patient noted to be hypoxic requiring 3 to 4 L nasal cannula.  Hospitalist called for further evaluation and admission  Review of Systems: Patient seen in the emergency room. Pt complains of pain, mostly in his chest as well as feeling shortness of breath.  Patient complains of a mild cough, minimally productive.  Complains of some bilateral hand pain as well.  He was complain of abdominal pain, he says it is not as bad now.  He feels very fatigued, feverish.  He has problems with chronic constipation  Pt denies any headaches, vision changes, dysphagia, palpitations, wheezing.  Denies any hematuria, dysuria, diarrhea.  Complains of his  hands hurting bilaterally, but does not complain of any lower extremity pain, numbness or weakness.  Review of systems are otherwise negative   Past Medical History:  Diagnosis Date  . Bipolar 1 disorder (HCC)   . Chronic back pain   . Hepatitis C   . Polysubstance abuse (HCC)   . Smoker    Past Surgical History:  Procedure Laterality Date  . ARM AMPUTATION    . MANDIBLE FRACTURE SURGERY      Social History:  reports that he has been smoking cigarettes. He has been smoking about 1.00 pack per day.  He last smoked a cigarette 3 days ago.  He has never used smokeless tobacco. He reports that he has current or past drug history. Drugs: Cocaine and IV heroin.  He last used heroin earlier this morning before coming in he reports that he does not drink alcohol.   No Known Allergies  Family History  Problem Relation Age of Onset  . Cancer Mother       Prior to Admission medications   Medication Sig Start Date End Date Taking? Authorizing Provider  loperamide (IMODIUM) 2 MG capsule Take 1 capsule (2 mg total) by mouth 4 (four) times daily as needed for diarrhea or loose stools. Patient not taking: Reported on 06/02/2018 11/19/17   Caccavale, Sophia, PA-C  meloxicam (MOBIC) 7.5 MG tablet Take 1 tablet (7.5 mg total) by mouth daily. Patient not taking: Reported on 11/19/2017 04/01/17   Cristina Gong, PA-C  ondansetron (ZOFRAN) 4 MG tablet Take 1 tablet (4 mg total) by mouth every 8 (eight) hours as needed for nausea  or vomiting. Patient not taking: Reported on 06/02/2018 11/19/17   Caccavale, Sophia, PA-C  risperiDONE (RISPERDAL) 2 MG tablet Take 1 tablet (2 mg total) by mouth at bedtime. Patient not taking: Reported on 03/31/2017 07/24/14   Rolland Porter, MD  traMADol (ULTRAM) 50 MG tablet Take 1 tablet (50 mg total) by mouth every 6 (six) hours as needed. Patient not taking: Reported on 11/19/2017 10/02/17   Jacalyn Lefevre, MD    Physical Exam: BP 112/71   Pulse (!) 105   Temp (S)  (!) 103.5 F (39.7 C) (Rectal)   Resp (!) 28   Ht 5\' 11"  (1.803 m)   Wt 72.6 kg   SpO2 95%   BMI 22.32 kg/m   General: Alert and oriented x3, moderate distress secondary to pain Eyes: Sclera nonicteric, extraocular movements are intact ENT: Normocephalic, atraumatic, mucous memories are slightly dry Neck: Supple, no JVD Cardiovascular: Regular rhythm, borderline tachycardia, 2 out of 6 systolic ejection murmur Respiratory: Decreased breath sounds bibasilar, no wheezes Abdomen: Soft, nontender, nondistended, hypoactive bowel sounds Skin: Patient noted to have petechiae above his ankles on both his lower extremities. Musculoskeletal: No clubbing cyanosis or edema Psychiatric: Appropriate, no evidence of psychoses Neurologic: No focal deficits          Labs on Admission:  Basic Metabolic Panel: Recent Labs  Lab 06/02/18 0608 06/08/18 0742  NA 135 126*  K 3.3* 3.8  CL 96* 82*  CO2 28 29  GLUCOSE 119* 96  BUN 8 22*  CREATININE 0.67 0.91  CALCIUM 9.0 8.3*   Liver Function Tests: Recent Labs  Lab 06/08/18 0742  AST 191*  ALT 87*  ALKPHOS 122  BILITOT 3.9*  PROT 6.1*  ALBUMIN 2.2*   Recent Labs  Lab 06/08/18 0742  LIPASE 29   No results for input(s): AMMONIA in the last 168 hours. CBC: Recent Labs  Lab 06/02/18 0608 06/08/18 0742  WBC 14.2* 21.2*  NEUTROABS 11.7* 18.8  HGB 14.2 12.4*  HCT 42.5 35.9*  MCV 88.4 84.9  PLT 154 103*   Cardiac Enzymes: No results for input(s): CKTOTAL, CKMB, CKMBINDEX, TROPONINI in the last 168 hours.  BNP (last 3 results) No results for input(s): BNP in the last 8760 hours.  ProBNP (last 3 results) No results for input(s): PROBNP in the last 8760 hours.  CBG: No results for input(s): GLUCAP in the last 168 hours.  Radiological Exams on Admission: Dg Chest 2 View  Result Date: 06/08/2018 CLINICAL DATA:  Upper abdominal pain and tightness, poor appetite, chest pain with shortness of breath EXAM: CHEST - 2 VIEW  COMPARISON:  Chest x-ray of 06/02/2018 and 11/19/2016 FINDINGS: There is now increased opacity at the left lung and mid left lung as well as the medial left apex suspicious for patchy pneumonia. There may be minimal involvement of the right lung base as well and follow-up is recommended. There is a small left pleural effusion present. Some fluid tracks into the major fissure on the lateral view. Mediastinal and hilar contours are unremarkable and the heart is minimally enlarged. No bony abnormality is seen. IMPRESSION: 1. Interval development of patchy opacities particularly at the left lung base, left mid lung and left lung apex with perhaps mild involvement of the right lung base, suspicious for multifocal pneumonia. Recommend follow-up chest x-ray. 2. Small left pleural effusion. Electronically Signed   By: Dwyane Dee M.D.   On: 06/08/2018 08:37   Ct Abdomen Pelvis W Contrast  Result Date: 06/08/2018 CLINICAL DATA:  Epigastric pain. Right lower quadrant pain and tenderness. Abdominal pain, appendicitis suspected. Acute pain, generalized. EXAM: CT ABDOMEN AND PELVIS WITH CONTRAST TECHNIQUE: Multidetector CT imaging of the abdomen and pelvis was performed using the standard protocol following bolus administration of intravenous contrast. CONTRAST:  ISOVUE-300 IOPAMIDOL (ISOVUE-300) INJECTION 61% COMPARISON:  One-view abdomen 12/13/2013 FINDINGS: Lower chest: Multiple poorly marginated cavitary pulmonary nodules are present in the lower lobes bilaterally. Left greater than right pleural effusion is present with associated atelectasis. There is no pneumothorax or greater consolidation. The heart size is normal. Hepatobiliary: A 9 mm simple cyst is present in the anterior right lobe of the liver. No other focal lesions are present. There is no ductal dilation. The gallbladder is normal. Pancreas: Unremarkable. No pancreatic ductal dilatation or surrounding inflammatory changes. Spleen: Mild splenomegaly is  noted. No discrete lesions are evident. Adrenals/Urinary Tract: Adrenal glands are normal bilaterally. Kidneys and ureters are within normal limits. Gallbladder is normal. Stomach/Bowel: Stomach and duodenum are within normal limits. Small bowel is unremarkable. Terminal ileum is visualized and within normal limits. The appendix is visualized and normal. Ascending transverse colon are normal. Descending and sigmoid colon are within normal limits. Vascular/Lymphatic: No significant vascular findings are present. No enlarged abdominal or pelvic lymph nodes. Reproductive: Prostate is unremarkable. Other: No abdominal wall hernia or abnormality. No abdominopelvic ascites. Musculoskeletal: Bilateral L2 pars defects are present. Alignment is anatomic. Vertebral body heights are normal. No focal lytic or blastic lesions are present. Pelvis is intact. Hips are located and within normal limits bilaterally. IMPRESSION: 1. Multiple poorly marginated cavitary pulmonary nodules in the lower lobes bilaterally. Given the history of polysubstance abuse, this most likely reflects septic emboli. Other atypical infection or malignancy is considered much less likely. 2. No acute or focal abnormality in the abdomen to explain abdominal pain. 3. Mild splenomegaly without focal lesion. Electronically Signed   By: Marin Roberts M.D.   On: 06/08/2018 10:11    EKG: Independently reviewed.  Sinus tachycardia with prolonged QT of 367, mild left atrial enlargement  Assessment/Plan Present on Admission: . Polysubstance abuse (HCC) . Abdominal pain . Hepatitis C, chronic (HCC) . Endocarditis of tricuspid valve . Septic embolism (HCC) . Multifocal pneumonia . Tobacco abuse  Principal Problem:   Endocarditis of tricuspid valve Active Problems:   Acute on chronic respiratory failure with hypoxia (HCC) secondary to multifocal pneumonia secondary to septic emboli from tricuspid endocarditis: Oxygen support.  IV antibiotics.   Blood cultures pending.  Patient critically ill.  Requiring 3 to 4 L to keep oxygen saturations above 90%.  Placed in ICU.  Infectious disease consulted.  Plan is for TEE on Friday.     Polysubstance abuse (HCC): Counseled.  Patient says he last used heroin earlier this morning   Alcohol dependence with unspecified alcohol-induced disorder (HCC)   Abdominal pain: Monitor for mesenteric ischemia.   Hepatitis C, chronic (HCC): Not currently on any medications and has not been seeing any physicians.  History of bipolar disorder: Not on any medications currently.  Patient may benefit from mood stabilizer which might help him with his substance use    Tobacco abuse: Patient declined nicotine patch.  Last cigarette was 3 days ago.   DVT prophylaxis: Lovenox  Code Status: Full code  Family Communication: No family, but patient gave him the number to update girlfriend  Disposition Plan:   Consults called: Infectious disease Cardiology  Admission status: Place in ICU, will be here for several days, requiring acute hospital services,  well past 2 midnights.  Therefore admit as inpatient    Hollice Espy MD [facility] Pager [phone]  If 7PM-7AM, please contact night-coverage [email or web address] Password [email or web address]  06/08/2018, 2:16 PM

## 2018-06-08 NOTE — ED Notes (Signed)
Bed: WA13 Expected date:  Expected time:  Means of arrival:  Comments: Abdominal pain 

## 2018-06-08 NOTE — ED Notes (Signed)
Patient transported to CT 

## 2018-06-08 NOTE — Progress Notes (Signed)
PHARMACY - PHYSICIAN COMMUNICATION CRITICAL VALUE ALERT - BLOOD CULTURE IDENTIFICATION (BCID)  Oscar Burke is an 37 y.o. male who presented to [facility] on 06/08/2018 with a chief complaint of aches ans fevers  Assessment:  Sepsis, with possible endocarditis  Name of physician (or Provider) Contacted: Jimmye Norman, NP  Current antibiotics: vancomycin, cefepime, Flagyl  Changes to prescribed antibiotics recommended:  Flagyl, stopped, vancomycin and cefepime continued  Results for orders placed or performed during the hospital encounter of 06/08/18  Blood Culture ID Panel (Reflexed) (Collected: 06/08/2018  7:47 AM)  Result Value Ref Range   Enterococcus species NOT DETECTED NOT DETECTED   Listeria monocytogenes NOT DETECTED NOT DETECTED   Staphylococcus species DETECTED (A) NOT DETECTED   Staphylococcus aureus DETECTED (A) NOT DETECTED   Methicillin resistance NOT DETECTED NOT DETECTED   Streptococcus species NOT DETECTED NOT DETECTED   Streptococcus agalactiae NOT DETECTED NOT DETECTED   Streptococcus pneumoniae NOT DETECTED NOT DETECTED   Streptococcus pyogenes NOT DETECTED NOT DETECTED   Acinetobacter baumannii NOT DETECTED NOT DETECTED   Enterobacteriaceae species NOT DETECTED NOT DETECTED   Enterobacter cloacae complex NOT DETECTED NOT DETECTED   Escherichia coli NOT DETECTED NOT DETECTED   Klebsiella oxytoca NOT DETECTED NOT DETECTED   Klebsiella pneumoniae NOT DETECTED NOT DETECTED   Proteus species NOT DETECTED NOT DETECTED   Serratia marcescens NOT DETECTED NOT DETECTED   Haemophilus influenzae NOT DETECTED NOT DETECTED   Neisseria meningitidis NOT DETECTED NOT DETECTED   Pseudomonas aeruginosa NOT DETECTED NOT DETECTED   Candida albicans NOT DETECTED NOT DETECTED   Candida glabrata NOT DETECTED NOT DETECTED   Candida krusei NOT DETECTED NOT DETECTED   Candida parapsilosis NOT DETECTED NOT DETECTED   Candida tropicalis NOT DETECTED NOT DETECTED   Adalberto Cole, PharmD, BCPS Pager [phone] 06/08/2018 9:45 PM

## 2018-06-09 DIAGNOSIS — R7881 Bacteremia: Secondary | ICD-10-CM

## 2018-06-09 DIAGNOSIS — Z872 Personal history of diseases of the skin and subcutaneous tissue: Secondary | ICD-10-CM

## 2018-06-09 DIAGNOSIS — F17210 Nicotine dependence, cigarettes, uncomplicated: Secondary | ICD-10-CM

## 2018-06-09 DIAGNOSIS — Z8659 Personal history of other mental and behavioral disorders: Secondary | ICD-10-CM

## 2018-06-09 DIAGNOSIS — B9561 Methicillin susceptible Staphylococcus aureus infection as the cause of diseases classified elsewhere: Secondary | ICD-10-CM

## 2018-06-09 DIAGNOSIS — J189 Pneumonia, unspecified organism: Secondary | ICD-10-CM

## 2018-06-09 DIAGNOSIS — I079 Rheumatic tricuspid valve disease, unspecified: Secondary | ICD-10-CM

## 2018-06-09 DIAGNOSIS — F1490 Cocaine use, unspecified, uncomplicated: Secondary | ICD-10-CM

## 2018-06-09 DIAGNOSIS — F1029 Alcohol dependence with unspecified alcohol-induced disorder: Secondary | ICD-10-CM

## 2018-06-09 DIAGNOSIS — F1994 Other psychoactive substance use, unspecified with psychoactive substance-induced mood disorder: Secondary | ICD-10-CM

## 2018-06-09 DIAGNOSIS — R1084 Generalized abdominal pain: Secondary | ICD-10-CM

## 2018-06-09 DIAGNOSIS — B182 Chronic viral hepatitis C: Secondary | ICD-10-CM

## 2018-06-09 DIAGNOSIS — F1190 Opioid use, unspecified, uncomplicated: Secondary | ICD-10-CM

## 2018-06-09 DIAGNOSIS — I2690 Septic pulmonary embolism without acute cor pulmonale: Secondary | ICD-10-CM

## 2018-06-09 DIAGNOSIS — F419 Anxiety disorder, unspecified: Secondary | ICD-10-CM

## 2018-06-09 LAB — BASIC METABOLIC PANEL
ANION GAP: 9 (ref 5–15)
BUN: 18 mg/dL (ref 6–20)
CALCIUM: 7.2 mg/dL — AB (ref 8.9–10.3)
CO2: 27 mmol/L (ref 22–32)
Chloride: 92 mmol/L — ABNORMAL LOW (ref 98–111)
Creatinine, Ser: 0.69 mg/dL (ref 0.61–1.24)
GFR calc Af Amer: >60 mL/min (ref >60)
Glucose, Bld: 112 mg/dL — ABNORMAL HIGH (ref 70–99)
POTASSIUM: 3.9 mmol/L (ref 3.5–5.1)
SODIUM: 128 mmol/L — AB (ref 135–145)

## 2018-06-09 LAB — HEPATITIS PANEL, ACUTE
HCV Ab: >11 s/co ratio — ABNORMAL HIGH (ref 0.0–0.9)
Hep A IgM: NEGATIVE
Hep B C IgM: NEGATIVE
Hepatitis B Surface Ag: NEGATIVE

## 2018-06-09 LAB — CBC
HEMATOCRIT: 29.7 % — AB (ref 39.0–52.0)
Hemoglobin: 10.2 g/dL — ABNORMAL LOW (ref 13.0–17.0)
MCH: 29.4 pg (ref 26.0–34.0)
MCHC: 34.3 g/dL (ref 30.0–36.0)
MCV: 85.6 fL (ref 78.0–100.0)
Platelets: 104 10*3/uL — ABNORMAL LOW (ref 150–400)
RBC: 3.47 MIL/uL — ABNORMAL LOW (ref 4.22–5.81)
RDW: 13.9 % (ref 11.5–15.5)
WBC: 19.1 10*3/uL — AB (ref 4.0–10.5)

## 2018-06-09 LAB — HIV ANTIBODY (ROUTINE TESTING W REFLEX): HIV Screen 4th Generation wRfx: NONREACTIVE

## 2018-06-09 MED ORDER — ENOXAPARIN SODIUM 40 MG/0.4ML ~~LOC~~ SOLN
40.0000 mg | SUBCUTANEOUS | Status: DC
  Administered 2018-06-09 – 2018-06-17 (×7): 40 mg via SUBCUTANEOUS
  Filled 2018-06-09 (×11): qty 0.4

## 2018-06-09 MED ORDER — RISPERIDONE 1 MG PO TABS
1.0000 mg | ORAL_TABLET | Freq: Every day | ORAL | Status: DC
  Administered 2018-06-09 – 2018-06-20 (×12): 1 mg via ORAL
  Filled 2018-06-09 (×13): qty 1

## 2018-06-09 MED ORDER — IBUPROFEN 200 MG PO TABS
600.0000 mg | ORAL_TABLET | Freq: Once | ORAL | Status: AC
  Administered 2018-06-09: 600 mg via ORAL
  Filled 2018-06-09: qty 3

## 2018-06-09 MED ORDER — CEFAZOLIN SODIUM-DEXTROSE 2-4 GM/100ML-% IV SOLN
2.0000 g | Freq: Three times a day (TID) | INTRAVENOUS | Status: DC
  Administered 2018-06-09 – 2018-06-21 (×36): 2 g via INTRAVENOUS
  Filled 2018-06-09 (×43): qty 100

## 2018-06-09 NOTE — Progress Notes (Signed)
PROGRESS NOTE  Talor Cheema  NWG:[id number] DOB: 15-Jun-1981 DOA: 06/08/2018 PCP: Patient, No Pcp Per   Brief Narrative: Jorma Tassinari is a 37 y.o. male with a history of tobacco use, IV heroin use, untreated bipolar disorder, and chronic hepatitis C who presented 10/2 with several days of fever, body aches, abdominal discomfort, pleuritic chest pain and mild cough. In the ED he was febrile to 103F with petechial rash to the lower extremities and hypoxia requiring 3-4L O2. CT abdomen noted no significant intraabdominal pathology, but demonstrated bilateral cavitary pulmonary nodules with CXR also showing multifocal pneumonia. Blood cultures were drawn, broad antibiotics started, and echocardiogram demonstrated tricuspid valve endocarditis.    Assessment & Plan: Principal Problem:   Endocarditis of tricuspid valve Active Problems:   Acute on chronic respiratory failure with hypoxia (HCC)   Polysubstance abuse (HCC)   Alcohol dependence with unspecified alcohol-induced disorder (HCC)   Abdominal pain   Hepatitis C, chronic (HCC)   Septic embolism (HCC)   Multifocal pneumonia   Tobacco abuse  Sepsis due to MSSA bacteremia and tricuspid endocarditis:  - ID consulted, recommending 6 weeks of IV antibiotics.  - Per echocardiogram read, recommending TEE for confirmation. - Repeat blood cultures 10/4 - IV fluids  Acute hypoxic respiratory failure due to multifocal pneumonia from septic emboli: - Continue supplemental oxygen and treating infection as above.  - Monitor closely in ICU for another 24 hours. Pt continues to have tachypnea, tachycardia, fever, and hypotension.  IV heroin use, opioid use disorder:  - Last heroin use was morning of 10/2. Started on suboxone, and prn tramadol prn. Will continue these, hold for sedation.  - Consider outpatient follow up pending clinical course.  - Not a candidate for outpatient IV therapy.   Alcohol use, polysubstance abuse:  - Monitor  closely in ICU until after period of peak withdrawal is past.   Abdominal pain: Unclear cause with negative CT and benign labs. Would not expect systemic emboli with only tricuspid involvement.  - Monitor clinically  Bipolar disorder:  - Monitor, consider mood stabilizer once outside the scope of active substance abuse.   Tobacco use:  - Declined nicotine patch.  - Cessation counseling provided for this and other substances.   DVT prophylaxis: Lovenox Code Status: Full Family Communication: None at bedside Disposition Plan: Uncertain, guarded prognosis  Consultants:   Infectious disease  Procedures:   Echocardiogram 06/08/2018:  - Left ventricle: The cavity size was normal. Wall thickness was   normal. Systolic function was normal. The estimated ejection   fraction was in the range of 55% to 60%. Wall motion was normal;   there were no regional wall motion abnormalities. Left   ventricular diastolic function parameters were normal. - Ventricular septum: Very mildly D-shaped interventricular septum   is concerning for RV pressure/volume overload. - Aortic valve: There was no stenosis. - Mitral valve: There was no regurgitation. - Right ventricle: The cavity size was mildly dilated. Systolic   function was normal. - Right atrium: The atrium was mildly dilated. - Tricuspid valve: There was mild-moderate regurgitation. Suspected   tricuspid vegetation. Peak RV-RA gradient (S): 25 mm Hg. - Pulmonary arteries: PA peak pressure: 33 mm Hg (S). - Systemic veins: IVC measured 2.3 cm with > 50% respirophasic   variation, suggesting RA pressure 8 mmHg. - Pericardium, extracardiac: Small circumferential pericardial   effusion.  Impressions:  - Normal LV size and systolic function, EF 55-60%. Normal diastolic   function. Mildly D-shaped interventricular septum suggesting a  degree of RV pressure/volume overload. Mildly dilated RV with   normal systolic function. Mild to moderate  TR. There does appear   to be a tricuspid valve vegetation. Need TEE to confirm.  Antimicrobials:  Vancomycin, cefepime, flagyl 10/2 - 10/3  Ancef 10/3 >>   Subjective: Tired with ongoing pleuritic chest pain. Abdominal pain is mild. Felt febrile this morning, having some shortness of breath but is better than at admission.  Objective: Vitals:   06/09/18 0900 06/09/18 1000 06/09/18 1100 06/09/18 1159  BP: 111/63 (!) 99/55 95/61   Pulse: 99 90 85   Resp: (!) 25 (!) 21 (!) 21   Temp:    (!) 97 F (36.1 C)  TempSrc:    Oral  SpO2: 96% 96% 96%   Weight:      Height:        Intake/Output Summary (Last 24 hours) at 06/09/2018 1507 Last data filed at 06/09/2018 1100 Gross per 24 hour  Intake 2089.18 ml  Output 1800 ml  Net 289.18 ml   Filed Weights   06/08/18 0755 06/09/18 0418  Weight: 72.6 kg 74.7 kg    Gen: 37 y.o. male in no distress Pulm: Tachypnea, diminished without wheezes or crackles.  CV: Regular tachycardia. No murmur, rub, or gallop. No JVD, no pedal edema. GI: Abdomen soft, non-tender, non-distended, with normoactive bowel sounds. No organomegaly or masses felt. Ext: Warm, no deformities. Left hand without visible deformity or palpable abnormality, though is tender to palpation.  Skin: Petechiae on lower legs bilaterally, track marks on arms bilaterally, diffuse tattoos  Neuro: Drowsy but rousable and oriented. No focal neurological deficits. Psych: Judgement and insight appear impaired. Mood & affect appropriate.   Data Reviewed: I have personally reviewed following labs and imaging studies  CBC: Recent Labs  Lab 06/08/18 0742 06/09/18 0315  WBC 21.2* 19.1*  NEUTROABS 18.8  --   HGB 12.4* 10.2*  HCT 35.9* 29.7*  MCV 84.9 85.6  PLT 103* 104*   Basic Metabolic Panel: Recent Labs  Lab 06/08/18 0742 06/09/18 0315  NA 126* 128*  K 3.8 3.9  CL 82* 92*  CO2 29 27  GLUCOSE 96 112*  BUN 22* 18  CREATININE 0.91 0.69  CALCIUM 8.3* 7.2*    GFR: Estimated Creatinine Clearance: 131 mL/min (by C-G formula based on SCr of 0.69 mg/dL). Liver Function Tests: Recent Labs  Lab 06/08/18 0742  AST 191*  ALT 87*  ALKPHOS 122  BILITOT 3.9*  PROT 6.1*  ALBUMIN 2.2*   Recent Labs  Lab 06/08/18 0742  LIPASE 29   No results for input(s): AMMONIA in the last 168 hours. Coagulation Profile: No results for input(s): INR, PROTIME in the last 168 hours. Cardiac Enzymes: No results for input(s): CKTOTAL, CKMB, CKMBINDEX, TROPONINI in the last 168 hours. BNP (last 3 results) No results for input(s): PROBNP in the last 8760 hours. HbA1C: No results for input(s): HGBA1C in the last 72 hours. CBG: No results for input(s): GLUCAP in the last 168 hours. Lipid Profile: No results for input(s): CHOL, HDL, LDLCALC, TRIG, CHOLHDL, LDLDIRECT in the last 72 hours. Thyroid Function Tests: No results for input(s): TSH, T4TOTAL, FREET4, T3FREE, THYROIDAB in the last 72 hours. Anemia Panel: No results for input(s): VITAMINB12, FOLATE, FERRITIN, TIBC, IRON, RETICCTPCT in the last 72 hours. Urine analysis:    Component Value Date/Time   COLORURINE AMBER (A) 06/08/2018 0811   APPEARANCEUR HAZY (A) 06/08/2018 0811   LABSPEC 1.017 06/08/2018 0811   PHURINE 5.0 06/08/2018  0811   GLUCOSEU NEGATIVE 06/08/2018 0811   HGBUR MODERATE (A) 06/08/2018 0811   BILIRUBINUR NEGATIVE 06/08/2018 0811   KETONESUR NEGATIVE 06/08/2018 0811   PROTEINUR 30 (A) 06/08/2018 0811   UROBILINOGEN 2.0 (H) 12/17/2013 0848   NITRITE NEGATIVE 06/08/2018 0811   LEUKOCYTESUR NEGATIVE 06/08/2018 0811   Recent Results (from the past 240 hour(s))  Blood Culture (routine x 2)     Status: Abnormal (Preliminary result)   Collection Time: 06/08/18  7:47 AM  Result Value Ref Range Status   Specimen Description   Final    BLOOD RIGHT ARM Performed at [facility], [address], [place], [place] [address]    Special Requests   Final    BOTTLES DRAWN  AEROBIC AND ANAEROBIC Blood Culture results may not be optimal due to an excessive volume of blood received in culture bottles Performed at [facility], [address], [place], [place] [address]    Culture  Setup Time   Final    GRAM POSITIVE COCCI IN BOTH AEROBIC AND ANAEROBIC BOTTLES CRITICAL RESULT CALLED TO, READ BACK BY AND VERIFIED WITH: PHARMD N GLOGOVAC [phone] MLM    Culture (A)  Final    STAPHYLOCOCCUS AUREUS SUSCEPTIBILITIES TO FOLLOW Performed at [facility], [address], [place], [place] [address]    Report Status PENDING  Incomplete  Blood Culture ID Panel (Reflexed)     Status: Abnormal   Collection Time: 06/08/18  7:47 AM  Result Value Ref Range Status   Enterococcus species NOT DETECTED NOT DETECTED Final   Listeria monocytogenes NOT DETECTED NOT DETECTED Final   Staphylococcus species DETECTED (A) NOT DETECTED Final    Comment: CRITICAL RESULT CALLED TO, READ BACK BY AND VERIFIED WITH: PHARMD N GLOGOVAC [phone] MLM    Staphylococcus aureus (BCID) DETECTED (A) NOT DETECTED Final    Comment: Methicillin (oxacillin) susceptible Staphylococcus aureus (MSSA). Preferred therapy is anti staphylococcal beta lactam antibiotic (Cefazolin or Nafcillin), unless clinically contraindicated. CRITICAL RESULT CALLED TO, READ BACK BY AND VERIFIED WITH: PHARMD N GLOGOVAC [phone] MLM    Methicillin resistance NOT DETECTED NOT DETECTED Final   Streptococcus species NOT DETECTED NOT DETECTED Final   Streptococcus agalactiae NOT DETECTED NOT DETECTED Final   Streptococcus pneumoniae NOT DETECTED NOT DETECTED Final   Streptococcus pyogenes NOT DETECTED NOT DETECTED Final   Acinetobacter baumannii NOT DETECTED NOT DETECTED Final   Enterobacteriaceae species NOT DETECTED NOT DETECTED Final   Enterobacter cloacae complex NOT DETECTED NOT DETECTED Final   Escherichia coli NOT DETECTED NOT DETECTED Final   Klebsiella oxytoca NOT DETECTED NOT  DETECTED Final   Klebsiella pneumoniae NOT DETECTED NOT DETECTED Final   Proteus species NOT DETECTED NOT DETECTED Final   Serratia marcescens NOT DETECTED NOT DETECTED Final   Haemophilus influenzae NOT DETECTED NOT DETECTED Final   Neisseria meningitidis NOT DETECTED NOT DETECTED Final   Pseudomonas aeruginosa NOT DETECTED NOT DETECTED Final   Candida albicans NOT DETECTED NOT DETECTED Final   Candida glabrata NOT DETECTED NOT DETECTED Final   Candida krusei NOT DETECTED NOT DETECTED Final   Candida parapsilosis NOT DETECTED NOT DETECTED Final   Candida tropicalis NOT DETECTED NOT DETECTED Final    Comment: Performed at [facility], [address] [place], [place] [address]  Blood Culture (routine x 2)     Status: Abnormal (Preliminary result)   Collection Time: 06/08/18  7:55 AM  Result Value Ref Range Status  Specimen Description   Final    BLOOD RIGHT WRIST Performed at [facility], [address], [place], [place] [address]    Special Requests   Final    BOTTLES DRAWN AEROBIC AND ANAEROBIC Blood Culture results may not be optimal due to an excessive volume of blood received in culture bottles Performed at [facility], [address], [place], [place] [address]    Culture  Setup Time   Final    GRAM POSITIVE COCCI IN BOTH AEROBIC AND ANAEROBIC BOTTLES CRITICAL VALUE NOTED.  VALUE IS CONSISTENT WITH PREVIOUSLY REPORTED AND CALLED VALUE.    Culture (A)  Final    STAPHYLOCOCCUS AUREUS SUSCEPTIBILITIES TO FOLLOW Performed at [facility], [address] [place], [place] [address]    Report Status PENDING  Incomplete  Culture, sputum-assessment     Status: None   Collection Time: 06/08/18  3:06 PM  Result Value Ref Range Status   Specimen Description EXPECTORATED SPUTUM  Final   Special Requests NONE  Final   Sputum evaluation   Final    THIS SPECIMEN IS ACCEPTABLE FOR SPUTUM CULTURE Performed at [facility], [address] [place], [place] [address]    Report Status 06/08/2018 FINAL  Final  Culture, respiratory     Status: None (Preliminary result)   Collection Time: 06/08/18  3:06 PM  Result Value Ref Range Status   Specimen Description   Final    EXPECTORATED SPUTUM Performed at [facility], [address] [place], [place] [address]    Special Requests   Final    NONE Reflexed from [phone] Performed at [facility], [address], [place], [place] [address]    Gram Stain   Final    ABUNDANT WBC PRESENT,BOTH PMN AND MONONUCLEAR FEW GRAM POSITIVE COCCI FEW GRAM NEGATIVE RODS FEW SQUAMOUS EPITHELIAL CELLS PRESENT    Culture   Final    TOO YOUNG TO READ Performed at [facility], [address], [place], [place] [address]    Report Status PENDING  Incomplete  MRSA PCR Screening     Status: None   Collection Time: 06/08/18  3:22 PM  Result Value Ref Range Status   MRSA by PCR NEGATIVE NEGATIVE Final    Comment:        The GeneXpert MRSA Assay (FDA approved for NASAL specimens only), is one component of a comprehensive MRSA colonization surveillance program. It is not intended to diagnose MRSA infection nor to guide or monitor treatment for MRSA infections. Performed at [facility], [address], [place], [place] [address]       Radiology Studies: Dg Chest 2 View  Result Date: 06/08/2018 CLINICAL DATA:  Upper abdominal pain and tightness, poor appetite, chest pain with shortness of breath EXAM: CHEST - 2 VIEW COMPARISON:  Chest x-ray of 06/02/2018 and 11/19/2016 FINDINGS: There is now increased opacity at the left lung and mid left lung as well as the medial left apex suspicious for patchy pneumonia. There may be minimal involvement of the right lung base as well and follow-up is recommended. There is a small left pleural effusion present. Some fluid tracks into the major fissure on the lateral view.  Mediastinal and hilar contours are unremarkable and the heart is minimally enlarged. No bony abnormality is seen. IMPRESSION: 1. Interval development of patchy opacities particularly at the left lung base, left mid lung and left lung apex with  perhaps mild involvement of the right lung base, suspicious for multifocal pneumonia. Recommend follow-up chest x-ray. 2. Small left pleural effusion. Electronically Signed   By: Dwyane Dee M.D.   On: 06/08/2018 08:37   Ct Abdomen Pelvis W Contrast  Result Date: 06/08/2018 CLINICAL DATA:  Epigastric pain. Right lower quadrant pain and tenderness. Abdominal pain, appendicitis suspected. Acute pain, generalized. EXAM: CT ABDOMEN AND PELVIS WITH CONTRAST TECHNIQUE: Multidetector CT imaging of the abdomen and pelvis was performed using the standard protocol following bolus administration of intravenous contrast. CONTRAST:  ISOVUE-300 IOPAMIDOL (ISOVUE-300) INJECTION 61% COMPARISON:  One-view abdomen 12/13/2013 FINDINGS: Lower chest: Multiple poorly marginated cavitary pulmonary nodules are present in the lower lobes bilaterally. Left greater than right pleural effusion is present with associated atelectasis. There is no pneumothorax or greater consolidation. The heart size is normal. Hepatobiliary: A 9 mm simple cyst is present in the anterior right lobe of the liver. No other focal lesions are present. There is no ductal dilation. The gallbladder is normal. Pancreas: Unremarkable. No pancreatic ductal dilatation or surrounding inflammatory changes. Spleen: Mild splenomegaly is noted. No discrete lesions are evident. Adrenals/Urinary Tract: Adrenal glands are normal bilaterally. Kidneys and ureters are within normal limits. Gallbladder is normal. Stomach/Bowel: Stomach and duodenum are within normal limits. Small bowel is unremarkable. Terminal ileum is visualized and within normal limits. The appendix is visualized and normal. Ascending transverse colon are normal.  Descending and sigmoid colon are within normal limits. Vascular/Lymphatic: No significant vascular findings are present. No enlarged abdominal or pelvic lymph nodes. Reproductive: Prostate is unremarkable. Other: No abdominal wall hernia or abnormality. No abdominopelvic ascites. Musculoskeletal: Bilateral L2 pars defects are present. Alignment is anatomic. Vertebral body heights are normal. No focal lytic or blastic lesions are present. Pelvis is intact. Hips are located and within normal limits bilaterally. IMPRESSION: 1. Multiple poorly marginated cavitary pulmonary nodules in the lower lobes bilaterally. Given the history of polysubstance abuse, this most likely reflects septic emboli. Other atypical infection or malignancy is considered much less likely. 2. No acute or focal abnormality in the abdomen to explain abdominal pain. 3. Mild splenomegaly without focal lesion. Electronically Signed   By: Marin Roberts M.D.   On: 06/08/2018 10:11   Dg Finger Index Left  Result Date: 06/08/2018 CLINICAL DATA:  Pain MCP joint of LEFT index finger. No known injury. EXAM: LEFT INDEX FINGER 2+V COMPARISON:  None. FINDINGS: There is no evidence of fracture or dislocation. There is no evidence of arthropathy or other focal bone abnormality. Soft tissues are unremarkable. IMPRESSION: Negative. Electronically Signed   By: Bary Richard M.D.   On: 06/08/2018 14:45    Scheduled Meds: . [START ON 06/10/2018] buprenorphine-naloxone  1 tablet Sublingual BID  . enoxaparin (LOVENOX) injection  40 mg Subcutaneous Q24H  . risperiDONE  2 mg Oral QHS   Continuous Infusions: . sodium chloride 100 mL/hr at 06/08/18 1535  .  ceFAZolin (ANCEF) IV Stopped (06/09/18 1416)     LOS: 1 day   Time spent: 35 minutes.  Tyrone Nine, MD [facility] [email or web address] Password [email or web address] 06/09/2018, 3:07 PM

## 2018-06-09 NOTE — Consult Note (Signed)
[facility]       Reason for Consult: Endocarditis    Referring Physician: Dr. Rito Ehrlich  Principal Problem:   Endocarditis of tricuspid valve Active Problems:   Acute on chronic respiratory failure with hypoxia (HCC)   Polysubstance abuse (HCC)   Alcohol dependence with unspecified alcohol-induced disorder (HCC)   Abdominal pain   Hepatitis C, chronic (HCC)   Septic embolism (HCC)   Multifocal pneumonia   Tobacco abuse   . [START ON 06/10/2018] buprenorphine-naloxone  1 tablet Sublingual BID  . risperiDONE  2 mg Oral QHS    Recommendations: Cefazolin IV Repeat blood cultures tomorrow Will need a prolonged course, not eligible for outpatient IV   Assessment: He has Staph aureus bacteremia with TV endocarditis based on TTE and based on septic emboli in lungs.  Will need 6 weeks of IV treatment.  From an ID standpoint, TEE is not needed.      Antibiotics: Vanco/cefepime/flagyl changed to cefazolin  HPI: Oscar Burke is a 37 y.o. male with IVDU currently, chronic hepatitis C, tobacco use, came in with significant pain in his chest, fever, chills and work up as above, TV endocarditis with septic emboli.  He has multiple skin infections in the past, untreated mental illness.  He complains of significant pleuritic chest pain.    No associated rash or diarrhea. + leukocytosis, fever.    Review of Systems:  Constitutional: positive for fevers, chills and malaise Respiratory: positive for pleurisy/chest pain, negative for hemoptysis Musculoskeletal: negative for arthralgias All other systems reviewed and are negative    Past Medical History:  Diagnosis Date  . Bipolar 1 disorder (HCC)   . Chronic back pain   . Hepatitis C   . Polysubstance abuse (HCC)   . Smoker     Social History   Tobacco Use  . Smoking status: Current Every Day Smoker    Packs/day: 1.00    Types: Cigarettes  . Smokeless tobacco: Never Used  Substance Use Topics   . Alcohol use: No    Frequency: Never  . Drug use: Yes    Types: Cocaine, IV    Comment: Heroin, ice, crack, and morphine pills    Family History  Problem Relation Age of Onset  . Cancer Mother     No Known Allergies  Physical Exam: Constitutional: in no apparent distress and alert  Vitals:   06/09/18 0758 06/09/18 0800  BP:  109/64  Pulse:  96  Resp:  (!) 22  Temp: 99.1 F (37.3 C)   SpO2:  95%   EYES: anicteric ENMT: no thrush Cardiovascular: Cor RRR Respiratory: CTA B; normal respiratory effort GI: Bowel sounds are normal, liver is not enlarged, spleen is not enlarged Musculoskeletal: no pedal edema noted Skin: multiple areas on arms of injection sites Hematologic: no cervical lad  Lab Results  Component Value Date   WBC 19.1 (H) 06/09/2018   HGB 10.2 (L) 06/09/2018   HCT 29.7 (L) 06/09/2018   MCV 85.6 06/09/2018   PLT 104 (L) 06/09/2018    Lab Results  Component Value Date   CREATININE 0.69 06/09/2018   BUN 18 06/09/2018   NA 128 (L) 06/09/2018   K 3.9 06/09/2018   CL 92 (L) 06/09/2018   CO2 27 06/09/2018    Lab Results  Component Value Date   ALT 87 (H) 06/08/2018   AST 191 (H) 06/08/2018   ALKPHOS 122 06/08/2018     Microbiology: Recent Results (from the past  240 hour(s))  Blood Culture (routine x 2)     Status: None (Preliminary result)   Collection Time: 06/08/18  7:47 AM  Result Value Ref Range Status   Specimen Description   Final    BLOOD RIGHT ARM Performed at [facility], [address], [place], [place] [address]    Special Requests   Final    BOTTLES DRAWN AEROBIC AND ANAEROBIC Blood Culture results may not be optimal due to an excessive volume of blood received in culture bottles Performed at [facility], [address] [place], [place] [address]    Culture  Setup Time   Final    GRAM POSITIVE COCCI IN BOTH AEROBIC AND ANAEROBIC BOTTLES CRITICAL RESULT CALLED TO, READ BACK BY AND  VERIFIED WITH: Abe People [phone] Performed at [facility], [address], [place], [place] [address]    Culture GRAM POSITIVE COCCI  Final   Report Status PENDING  Incomplete  Blood Culture ID Panel (Reflexed)     Status: Abnormal   Collection Time: 06/08/18  7:47 AM  Result Value Ref Range Status   Enterococcus species NOT DETECTED NOT DETECTED Final   Listeria monocytogenes NOT DETECTED NOT DETECTED Final   Staphylococcus species DETECTED (A) NOT DETECTED Final    Comment: CRITICAL RESULT CALLED TO, READ BACK BY AND VERIFIED WITH: PHARMD N GLOGOVAC [phone] MLM    Staphylococcus aureus DETECTED (A) NOT DETECTED Final    Comment: Methicillin (oxacillin) susceptible Staphylococcus aureus (MSSA). Preferred therapy is anti staphylococcal beta lactam antibiotic (Cefazolin or Nafcillin), unless clinically contraindicated. CRITICAL RESULT CALLED TO, READ BACK BY AND VERIFIED WITH: PHARMD N GLOGOVAC [phone] MLM    Methicillin resistance NOT DETECTED NOT DETECTED Final   Streptococcus species NOT DETECTED NOT DETECTED Final   Streptococcus agalactiae NOT DETECTED NOT DETECTED Final   Streptococcus pneumoniae NOT DETECTED NOT DETECTED Final   Streptococcus pyogenes NOT DETECTED NOT DETECTED Final   Acinetobacter baumannii NOT DETECTED NOT DETECTED Final   Enterobacteriaceae species NOT DETECTED NOT DETECTED Final   Enterobacter cloacae complex NOT DETECTED NOT DETECTED Final   Escherichia coli NOT DETECTED NOT DETECTED Final   Klebsiella oxytoca NOT DETECTED NOT DETECTED Final   Klebsiella pneumoniae NOT DETECTED NOT DETECTED Final   Proteus species NOT DETECTED NOT DETECTED Final   Serratia marcescens NOT DETECTED NOT DETECTED Final   Haemophilus influenzae NOT DETECTED NOT DETECTED Final   Neisseria meningitidis NOT DETECTED NOT DETECTED Final   Pseudomonas aeruginosa NOT DETECTED NOT DETECTED Final   Candida albicans NOT DETECTED NOT DETECTED Final    Candida glabrata NOT DETECTED NOT DETECTED Final   Candida krusei NOT DETECTED NOT DETECTED Final   Candida parapsilosis NOT DETECTED NOT DETECTED Final   Candida tropicalis NOT DETECTED NOT DETECTED Final    Comment: Performed at [facility], [address] [place], [place] [address]  Blood Culture (routine x 2)     Status: None (Preliminary result)   Collection Time: 06/08/18  7:55 AM  Result Value Ref Range Status   Specimen Description   Final    BLOOD RIGHT WRIST Performed at [facility], [address], [place], [place] [address]    Special Requests   Final    BOTTLES DRAWN AEROBIC AND ANAEROBIC Blood Culture results may not be optimal due to an excessive volume of blood received in culture bottles Performed at [facility], [address], [place], [place] [address]    Culture  Setup Time   Final    GRAM POSITIVE COCCI IN BOTH AEROBIC AND ANAEROBIC BOTTLES CRITICAL VALUE NOTED.  VALUE IS CONSISTENT WITH PREVIOUSLY REPORTED AND CALLED VALUE. Performed at [facility], [address], [place], [place] [address]    Culture GRAM POSITIVE COCCI  Final   Report Status PENDING  Incomplete  Culture, sputum-assessment     Status: None   Collection Time: 06/08/18  3:06 PM  Result Value Ref Range Status   Specimen Description EXPECTORATED SPUTUM  Final   Special Requests NONE  Final   Sputum evaluation   Final    THIS SPECIMEN IS ACCEPTABLE FOR SPUTUM CULTURE Performed at [facility], [address] [place], [place] [address]    Report Status 06/08/2018 FINAL  Final  Culture, respiratory     Status: None (Preliminary result)   Collection Time: 06/08/18  3:06 PM  Result Value Ref Range Status   Specimen Description   Final    EXPECTORATED SPUTUM Performed at [facility], [address], [place], [place] [address]    Special Requests   Final    NONE Reflexed from [id number] Performed at  [facility], [address] [place], [place] [address]    Gram Stain   Final    ABUNDANT WBC PRESENT,BOTH PMN AND MONONUCLEAR FEW GRAM POSITIVE COCCI FEW GRAM NEGATIVE RODS FEW SQUAMOUS EPITHELIAL CELLS PRESENT Performed at [facility], [address], [place], [place] [address]    Culture PENDING  Incomplete   Report Status PENDING  Incomplete  MRSA PCR Screening     Status: None   Collection Time: 06/08/18  3:22 PM  Result Value Ref Range Status   MRSA by PCR NEGATIVE NEGATIVE Final    Comment:        The GeneXpert MRSA Assay (FDA approved for NASAL specimens only), is one component of a comprehensive MRSA colonization surveillance program. It is not intended to diagnose MRSA infection nor to guide or monitor treatment for MRSA infections. Performed at [facility], [address] [place], [place] [address]     Gardiner Barefoot, MD [facility] [email or web address] [phone] pager  [phone] cell 06/09/2018, 9:40 AM

## 2018-06-09 NOTE — Progress Notes (Signed)
Pt has temp of 102.5. Brought pt 650 mg PO tylenol. Attempted to remove some of the blankets pt has covering him, refused. Will continue to monitor at this time

## 2018-06-09 NOTE — Consult Note (Signed)
[place] Psychiatry Consult   Reason for Consult:  Evaluation and assessment of co-morbid psychiatric diagnosis Referring Physician: Dr. Bonner Puna Patient Identification: Oscar Burke MRN:  [id number] Principal Diagnosis: Substance induced mood disorder [facility]) Diagnosis:   Patient Active Problem List   Diagnosis Date Noted  . Abdominal pain [R10.9] 06/08/2018  . Hepatitis C, chronic ([place]) [B18.2] 06/08/2018  . 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 Septic embolism ([place]) [I76] 06/08/2018  . Multifocal pneumonia [J18.9] 06/08/2018  . Tobacco abuse [Z72.0] 06/08/2018  . Polysubstance abuse ([place]) [F19.10] 04/07/2017  . Leukocytosis [D72.829] 04/07/2017  . Alcohol dependence with unspecified alcohol-induced disorder ([place]) [F10.29]   . Opiate overdose ([place]) [T40.601A] 12/13/2013  . Encephalopathy acute [G93.40] 12/13/2013  . Acute on chronic respiratory failure with hypoxia ([facility]) [J96.21] 12/13/2013    Total Time spent with patient: 1 hour  Subjective:   Oscar Burke is a 37 y.o. male patient admitted with endocarditis of tricuspid valve.  HPI:   Per chart review, patient was admitted with endocarditis of tricuspid valve and septic emboli. He has a history of bipolar disorder. Home medications include Risperdal 2 mg qhs. He is also receiving Klonopin 0.5 mg TID PRN. UDS was positive for opiates on admission.   On interview, Oscar Burke reports current depression and anxiety.  He reports that has not taken Risperdal in a month because he cannot get to [facility] to have his medication refilled.  He reports that it helps with his mood.  He endorses manic symptoms in the past in the absence of drug use.  He reports that he has not been manic in several months.  He was previously taking Klonopin but it was stopped by his provider due to concurrent drug use.  He reports that he uses heroin to keep him "calm."  He uses "2 points" daily.  He has been using methamphetamine to  stop using heroin.  He reports problematic drug use for several years.  He does not believe in rehab.  He believes that he will stop using when it is time.  He reports his longest period of sobriety was 8 months.  He denies problems with sleep or appetite.  He denies SI, HI or AVH.  He denies a history of suicide attempts.   Past Psychiatric History: BPAD and polysubstance (cocaine and heroin)  Risk to Self:  None. Denies SI.  Risk to Others:  None. Denies HI. Prior Inpatient Therapy:  He was hospitalized in [place] in 2014.  Prior Outpatient Therapy:  He was followed at [facility].   Past Medical History:  Past Medical History:  Diagnosis Date  . Bipolar 1 disorder ([place])   . Chronic back pain   . Hepatitis C   . Polysubstance abuse ([place])   . Smoker     Past Surgical History:  Procedure Laterality Date  . ARM AMPUTATION    . MANDIBLE FRACTURE SURGERY     Family History:  Family History  Problem Relation Age of Onset  . Cancer Mother    Family Psychiatric  History: Brother committed suicide in 2013. He abused drugs. His sister is also a substance abuser.   Social History:  Social History   Substance and Sexual Activity  Alcohol Use No  . Frequency: Never     Social History   Substance and Sexual Activity  Drug Use Yes  . Types: Cocaine, IV   Comment: Heroin, ice, crack, and morphine pills    Social History   Socioeconomic History  .  Marital status: Single    Spouse name: Not on file  . Number of children: Not on file  . Years of education: Not on file  . Highest education level: Not on file  Occupational History  . Not on file  Social Needs  . Financial resource strain: Not on file  . Food insecurity:    Worry: Not on file    Inability: Not on file  . Transportation needs:    Medical: Not on file    Non-medical: Not on file  Tobacco Use  . Smoking status: Current Every Day Smoker    Packs/day: 1.00    Types: Cigarettes  . Smokeless tobacco: Never Used   Substance and Sexual Activity  . Alcohol use: No    Frequency: Never  . Drug use: Yes    Types: Cocaine, IV    Comment: Heroin, ice, crack, and morphine pills  . Sexual activity: Not on file  Lifestyle  . Physical activity:    Days per week: Not on file    Minutes per session: Not on file  . Stress: Not on file  Relationships  . Social connections:    Talks on phone: Not on file    Gets together: Not on file    Attends religious service: Not on file    Active member of club or organization: Not on file    Attends meetings of clubs or organizations: Not on file    Relationship status: Not on file  Other Topics Concern  . Not on file  Social History Narrative  . Not on file   Additional Social History: He lives with his girlfriend of 5 years. He has 3 children (57, 59 and 20 y/o) although their mother has custody of them. He does not have contact with his children. He is unemployed x 2 years. 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 He reports heavy use of IV heroin and methamphetamine use.     Allergies:  No Known Allergies  Labs:  Results for orders placed or performed during the hospital encounter of 06/08/18 (from the past 48 hour(s))  Comprehensive metabolic panel     Status: Abnormal   Collection Time: 06/08/18  7:42 AM  Result Value Ref Range   Sodium 126 (L) 135 - 145 mmol/L   Potassium 3.8 3.5 - 5.1 mmol/L   Chloride 82 (L) 98 - 111 mmol/L   CO2 29 22 - 32 mmol/L   Glucose, Bld 96 70 - 99 mg/dL   BUN 22 (H) 6 - 20 mg/dL   Creatinine, Ser 0.91 0.61 - 1.24 mg/dL   Calcium 8.3 (L) 8.9 - 10.3 mg/dL   Total Protein 6.1 (L) 6.5 - 8.1 g/dL   Albumin 2.2 (L) 3.5 - 5.0 g/dL   AST 191 (H) 15 - 41 U/L   ALT 87 (H) 0 - 44 U/L   Alkaline Phosphatase 122 38 - 126 U/L   Total Bilirubin 3.9 (H) 0.3 - 1.2 mg/dL   GFR calc non Af Amer >60 >60 mL/min   GFR calc Af Amer >60 >60 mL/min    Comment: (NOTE) The eGFR has been calculated using the CKD EPI equation. This  calculation has not been validated in all clinical situations. eGFR's persistently <60 mL/min signify possible Chronic Kidney Disease.    Anion gap 15 5 - 15    Comment: Performed at [facility], [place] [address], [place], [place] [address]  Lipase, blood     Status:  None   Collection Time: 06/08/18  7:42 AM  Result Value Ref Range   Lipase 29 11 - 51 U/L    Comment: Performed at [facility], [place] [address], [place], [place] [address]  CBC with Differential     Status: Abnormal   Collection Time: 06/08/18  7:42 AM  Result Value Ref Range   WBC 21.2 (H) 4.0 - 10.5 K/uL   RBC 4.23 4.22 - 5.81 MIL/uL   Hemoglobin 12.4 (L) 13.0 - 17.0 g/dL   HCT 35.9 (L) 39.0 - 52.0 %   MCV 84.9 78.0 - 100.0 fL   MCH 29.3 26.0 - 34.0 pg   MCHC 34.5 30.0 - 36.0 g/dL   RDW 13.7 11.5 - 15.5 %   Platelets 103 (L) 150 - 400 K/uL    Comment: REPEATED TO VERIFY SPECIMEN CHECKED FOR CLOTS PLATELET COUNT CONFIRMED BY SMEAR    Neutrophils Relative % 89 %   Neutro Abs 18.8 1.7 - 7.7 K/uL   Lymphocytes Relative 3 %   Lymphs Abs 0.6 0.7 - 4.0 K/uL   Monocytes Relative 8 %   Monocytes Absolute 1.8 0.1 - 1.0 K/uL   Eosinophils Relative 0 %   Eosinophils Absolute 0.0 0.0 - 0.7 K/uL   Basophils Relative 0 %   Basophils Absolute 0.0 0.0 - 0.1 K/uL   WBC Morphology MILD LEFT SHIFT (1-5% METAS, OCC MYELO, OCC BANDS)     Comment: DOHLE BODIES Performed at [facility], [place] [address], [place], [place] [address]   Blood Culture (routine x 2)     Status: Abnormal (Preliminary result)   Collection Time: 06/08/18  7:47 AM  Result Value Ref Range   Specimen Description      BLOOD RIGHT ARM Performed at [place] [facility] [address] [place], [place] [address]    Special Requests      BOTTLES DRAWN AEROBIC AND ANAEROBIC Blood Culture results may not be optimal due to an excessive volume of blood received in culture bottles Performed at  [place] [address] [place], [place] [address]    Culture  Setup Time      GRAM POSITIVE COCCI IN BOTH AEROBIC AND ANAEROBIC BOTTLES CRITICAL RESULT CALLED TO, READ BACK BY AND VERIFIED WITH: PHARMD N Erwin [phone] MLM    Culture (A)     STAPHYLOCOCCUS AUREUS SUSCEPTIBILITIES TO FOLLOW Performed at [facility], [place] [address], [place], [place] [address]    Report Status PENDING   Blood Culture ID Panel (Reflexed)     Status: Abnormal   Collection Time: 06/08/18  7:47 AM  Result Value Ref Range   Enterococcus species NOT DETECTED NOT DETECTED   Listeria monocytogenes NOT DETECTED NOT DETECTED   Staphylococcus species DETECTED (A) NOT DETECTED    Comment: CRITICAL RESULT CALLED TO, READ BACK BY AND VERIFIED WITH: PHARMD N GLOGOVAC [phone] MLM    Staphylococcus aureus (BCID) DETECTED (A) NOT DETECTED    Comment: Methicillin (oxacillin) susceptible Staphylococcus aureus (MSSA). Preferred therapy is anti staphylococcal beta lactam antibiotic (Cefazolin or Nafcillin), unless clinically contraindicated. CRITICAL RESULT CALLED TO, READ BACK BY AND VERIFIED WITH: PHARMD N [place]o [phone] MLM    Methicillin resistance NOT DETECTED NOT DETECTED   Streptococcus species NOT DETECTED NOT DETECTED   Streptococcus agalactiae NOT DETECTED NOT DETECTED   Streptococcus pneumoniae NOT DETECTED NOT DETECTED   Streptococcus pyogenes NOT DETECTED NOT DETECTED   Acinetobacter baumannii NOT  DETECTED NOT DETECTED   Enterobacteriaceae species NOT DETECTED NOT DETECTED   Enterobacter cloacae complex NOT DETECTED NOT DETECTED   Escherichia coli NOT DETECTED NOT DETECTED   Klebsiella oxytoca NOT DETECTED NOT DETECTED   Klebsiella pneumoniae NOT DETECTED NOT DETECTED   Proteus species NOT DETECTED NOT DETECTED   Serratia marcescens NOT DETECTED NOT DETECTED   Haemophilus influenzae NOT DETECTED NOT DETECTED   Neisseria meningitidis NOT DETECTED NOT  DETECTED   Pseudomonas aeruginosa NOT DETECTED NOT DETECTED   Candida albicans NOT DETECTED NOT DETECTED   Candida glabrata NOT DETECTED NOT DETECTED   Candida krusei NOT DETECTED NOT DETECTED   Candida parapsilosis NOT DETECTED NOT DETECTED   Candida tropicalis NOT DETECTED NOT DETECTED    Comment: Performed at [facility], [place] [address] [place], [place] [address]  I-Stat CG4 Lactic Acid, ED  (not at  [facility])     Status: None   Collection Time: 06/08/18  7:50 AM  Result Value Ref Range   Lactic Acid, Venous 1.39 0.5 - 1.9 mmol/L  Blood Culture (routine x 2)     Status: Abnormal (Preliminary result)   Collection Time: 06/08/18  7:55 AM  Result Value Ref Range   Specimen Description      BLOOD RIGHT WRIST Performed at [facility] [address], [place], [place] [address]    Special Requests      BOTTLES DRAWN AEROBIC AND ANAEROBIC Blood Culture results may not be optimal due to an excessive volume of blood received in culture bottles Performed at [place] [address] [place], [place] [address]    Culture  Setup Time      GRAM POSITIVE COCCI IN BOTH AEROBIC AND ANAEROBIC BOTTLES CRITICAL VALUE NOTED.  VALUE IS CONSISTENT WITH PREVIOUSLY REPORTED AND CALLED VALUE.    Culture (A)     STAPHYLOCOCCUS AUREUS SUSCEPTIBILITIES TO FOLLOW Performed at [facility], [place] [address] [place], [place] [address]    Report Status PENDING   Urinalysis, Routine w reflex microscopic     Status: Abnormal   Collection Time: 06/08/18  8:11 AM  Result Value Ref Range   Color, Urine AMBER (A) YELLOW    Comment: BIOCHEMICALS MAY BE AFFECTED BY COLOR   APPearance HAZY (A) CLEAR   Specific Gravity, Urine 1.017 1.005 - 1.030   pH 5.0 5.0 - 8.0   Glucose, UA NEGATIVE NEGATIVE mg/dL   Hgb urine dipstick MODERATE (A) NEGATIVE   Bilirubin Urine NEGATIVE NEGATIVE   Ketones, ur NEGATIVE NEGATIVE mg/dL   Protein, ur 30 (A) NEGATIVE mg/dL    Nitrite NEGATIVE NEGATIVE   Leukocytes, UA NEGATIVE NEGATIVE   RBC / HPF 0-5 0 - 5 RBC/hpf   WBC, UA 6-10 0 - 5 WBC/hpf   Bacteria, UA RARE (A) NONE SEEN   Squamous Epithelial / LPF 0-5 0 - 5    Comment: Performed at [facility], [place] [address], [place], [place] [address]  Rapid urine drug screen (hospital performed)     Status: Abnormal   Collection Time: 06/08/18  8:11 AM  Result Value Ref Range   Opiates POSITIVE (A) NONE DETECTED   Cocaine NONE DETECTED NONE DETECTED   Benzodiazepines NONE DETECTED NONE DETECTED   Amphetamines NONE DETECTED NONE DETECTED   Tetrahydrocannabinol NONE DETECTED NONE DETECTED   Barbiturates NONE DETECTED NONE DETECTED    Comment: (NOTE) DRUG SCREEN FOR MEDICAL PURPOSES ONLY.  IF CONFIRMATION IS NEEDED FOR ANY  PURPOSE, NOTIFY LAB WITHIN 5 DAYS. LOWEST DETECTABLE LIMITS FOR URINE DRUG SCREEN Drug Class                     Cutoff (ng/mL) Amphetamine and metabolites    1000 Barbiturate and metabolites    200 Benzodiazepine                 416 Tricyclics and metabolites     300 Opiates and metabolites        300 Cocaine and metabolites        300 THC                            50 Performed at [facility], [place] [address], [place], [place] [address]   HIV antibody     Status: None   Collection Time: 06/08/18  9:34 AM  Result Value Ref Range   HIV Screen 4th Generation wRfx Non Reactive Non Reactive    Comment: (NOTE) Performed At: [facility] [place], [place] [id number] Rush Farmer MD FT:[id number]   Hepatitis panel, acute     Status: Abnormal   Collection Time: 06/08/18  9:34 AM  Result Value Ref Range   Hepatitis B Surface Ag Negative Negative   HCV Ab >11.0 (H) 0.0 - 0.9 s/co ratio    Comment: (NOTE)                                  Negative:     < 0.8                             Indeterminate: 0.8 - 0.9                                  Positive:     > 0.9 The CDC  recommends that a positive HCV antibody result be followed up with a HCV Nucleic Acid Amplification test (062376). Performed At: [facility] [place], [place] [id number] Rush Farmer MD YW:[id number]    Hep A IgM Negative Negative   Hep B C IgM Negative Negative  Culture, sputum-assessment     Status: None   Collection Time: 06/08/18  3:06 PM  Result Value Ref Range   Specimen Description EXPECTORATED SPUTUM    Special Requests NONE    Sputum evaluation      THIS SPECIMEN IS ACCEPTABLE FOR SPUTUM CULTURE Performed at [facility], [place] [address] [place], [place] [address]    Report Status 06/08/2018 FINAL   Strep pneumoniae urinary antigen     Status: None   Collection Time: 06/08/18  3:06 PM  Result Value Ref Range   Strep Pneumo Urinary Antigen NEGATIVE NEGATIVE    Comment:        Infection due to S. pneumoniae cannot be absolutely ruled out since the antigen present may be below the detection limit of the test. Performed at [facility], [address], [place], [place] [address]   Culture, respiratory     Status: None (Preliminary result)   Collection Time: 06/08/18  3:06 PM  Result Value Ref Range   Specimen Description      EXPECTORATED SPUTUM Performed at [facility],  [place] [address], [place], [place] [address]    Special Requests      NONE Reflexed from [id number] Performed at [facility], [place] [address] [place], [place] [address]    Gram Stain      ABUNDANT WBC PRESENT,BOTH PMN AND MONONUCLEAR FEW GRAM POSITIVE COCCI FEW GRAM NEGATIVE RODS FEW SQUAMOUS EPITHELIAL CELLS PRESENT    Culture      TOO YOUNG TO READ Performed at [facility], [place] [address] [place], [place] [address]    Report Status PENDING   MRSA PCR Screening     Status: None   Collection Time: 06/08/18  3:22 PM  Result Value Ref Range   MRSA by PCR NEGATIVE NEGATIVE    Comment:        The  GeneXpert MRSA Assay (FDA approved for NASAL specimens only), is one component of a comprehensive MRSA colonization surveillance program. It is not intended to diagnose MRSA infection nor to guide or monitor treatment for MRSA infections. Performed at [facility], [place] [address] [place], [place] [address]   Basic metabolic panel     Status: Abnormal   Collection Time: 06/09/18  3:15 AM  Result Value Ref Range   Sodium 128 (L) 135 - 145 mmol/L   Potassium 3.9 3.5 - 5.1 mmol/L   Chloride 92 (L) 98 - 111 mmol/L   CO2 27 22 - 32 mmol/L   Glucose, Bld 112 (H) 70 - 99 mg/dL   BUN 18 6 - 20 mg/dL   Creatinine, Ser 0.69 0.61 - 1.24 mg/dL   Calcium 7.2 (L) 8.9 - 10.3 mg/dL   GFR calc non Af Amer >60 >60 mL/min   GFR calc Af Amer >60 >60 mL/min    Comment: (NOTE) The eGFR has been calculated using the CKD EPI equation. This calculation has not been validated in all clinical situations. eGFR's persistently <60 mL/min signify possible Chronic Kidney Disease.    Anion gap 9 5 - 15    Comment: Performed at [facility], [place] [address] [place], [place] [address]  CBC     Status: Abnormal   Collection Time: 06/09/18  3:15 AM  Result Value Ref Range   WBC 19.1 (H) 4.0 - 10.5 K/uL   RBC 3.47 (L) 4.22 - 5.81 MIL/uL   Hemoglobin 10.2 (L) 13.0 - 17.0 g/dL   HCT 29.7 (L) 39.0 - 52.0 %   MCV 85.6 78.0 - 100.0 fL   MCH 29.4 26.0 - 34.0 pg   MCHC 34.3 30.0 - 36.0 g/dL   RDW 13.9 11.5 - 15.5 %   Platelets 104 (L) 150 - 400 K/uL    Comment: SPECIMEN CHECKED FOR CLOTS REPEATED TO VERIFY CONSISTENT WITH PREVIOUS RESULT Performed at [place] [address], [place],  [address]     Current Facility-Administered Medications  Medication Dose Route Frequency Provider Last Rate Last Dose  . 0.9 %  sodium chloride infusion   Intravenous Continuous Annita Brod, MD 100 mL/hr at 06/08/18 1535    . acetaminophen (TYLENOL)  tablet 650 mg  650 mg Oral Q6H PRN Annita Brod, MD   650 mg at 06/09/18 0417   Or  . acetaminophen (TYLENOL) suppository 650 mg  650 mg Rectal Q6H PRN Annita Brod, MD      . buprenorphine-naloxone (SUBOXONE) 2-0.5 mg per SL tablet 2 tablet  2 tablet Sublingual PRN Annita Brod, MD      . [  START ON 06/10/2018] buprenorphine-naloxone (SUBOXONE) 8-2 mg per SL tablet 1 tablet  1 tablet Sublingual BID Annita Brod, MD      . ceFAZolin (ANCEF) IVPB 2g/100 mL premix  2 g Intravenous Q8H Thayer Headings, MD      . clonazePAM [facility] [place]) tablet 0.5 mg  0.5 mg Oral TID PRN Annita Brod, MD   0.5 mg at 06/09/18 0324  . enoxaparin (LOVENOX) injection 40 mg  40 mg Subcutaneous Q24H Vance Gather B, MD      . ondansetron [facility]) tablet 4 mg  4 mg Oral Q6H PRN Annita Brod, MD       Or  . ondansetron [facility]) injection 4 mg  4 mg Intravenous Q6H PRN Annita Brod, MD      . risperiDONE (RISPERDAL) tablet 2 mg  2 mg Oral QHS Annita Brod, MD   2 mg at 06/08/18 2121  . traMADol (ULTRAM) tablet 100 mg  100 mg Oral Q6H PRN Annita Brod, MD   100 mg at 06/09/18 2297    Musculoskeletal: Strength & Muscle Tone: within normal limits Gait & Station: UTA since patient is lying in bed. Patient leans: N/A  Psychiatric Specialty Exam: Physical Exam  Nursing note and vitals reviewed. Constitutional: He is oriented to person, place, and time. He appears well-developed and well-nourished.  HENT:  Head: Normocephalic and atraumatic.  Neck: Normal range of motion.  Respiratory: Effort normal.  Musculoskeletal: Normal range of motion.  Neurological: He is alert and oriented to person, place, and time.  Psychiatric: He has a normal mood and affect. His behavior is normal. Judgment and thought content normal. His speech is slurred. Cognition and memory are normal.    Review of Systems  Constitutional: Negative for chills and fever.  Respiratory: Positive for cough.    Cardiovascular: Positive for chest pain.  Gastrointestinal: Negative for abdominal pain, constipation, diarrhea, nausea and vomiting.  Psychiatric/Behavioral: Positive for depression and substance abuse. Negative for hallucinations and suicidal ideas. The patient is nervous/anxious. The patient does not have insomnia.   All other systems reviewed and are negative.   Blood pressure 95/61, pulse 85, temperature (!) 97 F (36.1 C), temperature source Oral, resp. rate (!) 21, height '5\' 11"'$  (1.803 m), weight 74.7 kg, SpO2 96 %.Body mass index is 22.97 kg/m.  General Appearance: Fairly Groomed, young, Caucasian male, wearing a hospital gown with multiple tattoos who is lying in bed. NAD.   Eye Contact:  Good  Speech:  Slow and Slurred  Volume:  Decreased  Mood:  Anxious and Depressed  Affect:  Constricted  Thought Process:  Goal Directed, Linear and Descriptions of Associations: Intact  Orientation:  Full (Time, Place, and Person)  Thought Content:  Logical  Suicidal Thoughts:  No  Homicidal Thoughts:  No  Memory:  Immediate;   Good Recent;   Good Remote;   Good  Judgement:  Fair  Insight:  Fair  Psychomotor Activity:  Decreased  Concentration:  Concentration: Good and Attention Span: Good  Recall:  Good  Fund of Knowledge:  Good  Language:  Good  Akathisia:  No  Handed:  Right  AIMS (if indicated):   N/A  Assets:  Communication Skills Desire for Improvement Financial Resources/Insurance Housing Intimacy Social Support  ADL's:  Intact  Cognition:  WNL  Sleep:   Okay   Assessment:  Taison Celani is a 37 y.o. male who was admitted with endocarditis of tricuspid valve with septic emboli. He reports a  history of bipolar disorder with prior manic episodes but unclear if in setting of substance use. He reports good effect with Risperdal in the past for mood stabilization. He denies SI, HI or AVH. He does not warrant inpatient psychiatric hospitalization at this time.   Treatment  Plan Summary: -Restart Risperdal 1 mg qhs for mood stabilization. Can increase to 2 mg qhs if partially effective.  -EKG reviewed and QTc 504 on 10/2. Please closely monitor when starting or increasing QTc prolonging agents. Would repeat EKG given current QTc prolongation.  -Patient will follow up with [facility] for medication management.  -Psychiatry will sign off on patient at this time. Please consult psychiatry again as needed.    Disposition: No evidence of imminent risk to self or others at present.   Patient does not meet criteria for psychiatric inpatient admission.  Faythe Dingwall, DO 06/09/2018 1:08 PM

## 2018-06-09 NOTE — Progress Notes (Signed)
Patient sleeping at the time of the Chaplain visit. Chaplain will follow up at a later time. Chaplain Janell Quiet [phone]

## 2018-06-10 DIAGNOSIS — B1920 Unspecified viral hepatitis C without hepatic coma: Secondary | ICD-10-CM

## 2018-06-10 DIAGNOSIS — I76 Septic arterial embolism: Secondary | ICD-10-CM

## 2018-06-10 DIAGNOSIS — R091 Pleurisy: Secondary | ICD-10-CM

## 2018-06-10 DIAGNOSIS — F1910 Other psychoactive substance abuse, uncomplicated: Secondary | ICD-10-CM

## 2018-06-10 DIAGNOSIS — R0789 Other chest pain: Secondary | ICD-10-CM

## 2018-06-10 LAB — CBC
HCT: 29.1 % — ABNORMAL LOW (ref 39.0–52.0)
HEMOGLOBIN: 9.7 g/dL — AB (ref 13.0–17.0)
MCH: 28.8 pg (ref 26.0–34.0)
MCHC: 33.3 g/dL (ref 30.0–36.0)
MCV: 86.4 fL (ref 78.0–100.0)
Platelets: 108 10*3/uL — ABNORMAL LOW (ref 150–400)
RBC: 3.37 MIL/uL — AB (ref 4.22–5.81)
RDW: 14 % (ref 11.5–15.5)
WBC: 19.4 10*3/uL — AB (ref 4.0–10.5)

## 2018-06-10 LAB — COMPREHENSIVE METABOLIC PANEL
ALT: 36 U/L (ref 0–44)
ANION GAP: 8 (ref 5–15)
AST: 72 U/L — ABNORMAL HIGH (ref 15–41)
Albumin: 1.4 g/dL — ABNORMAL LOW (ref 3.5–5.0)
Alkaline Phosphatase: 105 U/L (ref 38–126)
BUN: 22 mg/dL — ABNORMAL HIGH (ref 6–20)
CALCIUM: 7.1 mg/dL — AB (ref 8.9–10.3)
CHLORIDE: 94 mmol/L — AB (ref 98–111)
CO2: 27 mmol/L (ref 22–32)
Creatinine, Ser: 0.83 mg/dL (ref 0.61–1.24)
Glucose, Bld: 115 mg/dL — ABNORMAL HIGH (ref 70–99)
Potassium: 4.2 mmol/L (ref 3.5–5.1)
Sodium: 129 mmol/L — ABNORMAL LOW (ref 135–145)
Total Bilirubin: 1.3 mg/dL — ABNORMAL HIGH (ref 0.3–1.2)
Total Protein: 4.6 g/dL — ABNORMAL LOW (ref 6.5–8.1)

## 2018-06-10 MED ORDER — BUPRENORPHINE HCL-NALOXONE HCL 2-0.5 MG SL SUBL: 2.0000 | SUBLINGUAL_TABLET | SUBLINGUAL | Status: AC | PRN

## 2018-06-10 MED ORDER — ALUM & MAG HYDROXIDE-SIMETH 200-200-20 MG/5ML PO SUSP
30.0000 mL | ORAL | Status: DC | PRN
  Administered 2018-06-18: 30 mL via ORAL
  Filled 2018-06-10: qty 30

## 2018-06-10 MED ORDER — GUAIFENESIN-DM 100-10 MG/5ML PO SYRP
5.0000 mL | ORAL_SOLUTION | ORAL | Status: DC | PRN
  Administered 2018-06-10 – 2018-06-18 (×8): 5 mL via ORAL
  Filled 2018-06-10 (×2): qty 5
  Filled 2018-06-10 (×3): qty 10
  Filled 2018-06-10: qty 5
  Filled 2018-06-10 (×2): qty 10

## 2018-06-10 MED ORDER — ORAL CARE MOUTH RINSE
15.0000 mL | Freq: Two times a day (BID) | OROMUCOSAL | Status: DC
  Administered 2018-06-10 – 2018-06-21 (×12): 15 mL via OROMUCOSAL

## 2018-06-10 MED ORDER — MUSCLE RUB 10-15 % EX CREA
TOPICAL_CREAM | CUTANEOUS | Status: DC | PRN
  Administered 2018-06-10 – 2018-06-11 (×2): 1 via TOPICAL
  Filled 2018-06-10: qty 85

## 2018-06-10 MED ORDER — SODIUM CHLORIDE 0.9 % IV SOLN
INTRAVENOUS | Status: DC | PRN
  Administered 2018-06-10 – 2018-06-20 (×4): 250 mL via INTRAVENOUS

## 2018-06-10 NOTE — Progress Notes (Signed)
    [facility] has been requested to perform a transesophageal echocardiogram on Oscar Burke for endocarditis.  After careful review of history and examination, the risks and benefits of transesophageal echocardiogram have been explained including risks of esophageal damage, perforation (1:10,000 risk), bleeding, pharyngeal hematoma as well as other potential complications associated with conscious sedation including aspiration, arrhythmia, respiratory failure and death. Alternatives to treatment were discussed, questions were answered. Patient is willing to proceed.   Pt is scheduled for TEE on Monday, 06/13/18 at 12:30 with Dr. Eden Emms. NPO at [facility] Sunday night please. Nursing will arrange transport to [facility].   Roe Rutherford Ledger Heindl, [place]  06/10/2018 8:36 AM

## 2018-06-10 NOTE — Progress Notes (Addendum)
CSW attempted to meet with the patient.  Patient sleeping soundly.  CSW reviewed psychiatrist evaluation and recommendation, "Patient will follow up with [facility] for medication management." CSW put this information on the AVS.  CSW will continue to follow for needs.   Vivi Barrack, [place], MSW Clinical Social Worker  [phone] 06/10/2018  3:19 PM

## 2018-06-10 NOTE — Care Management Note (Signed)
Case Management Note  Patient Details  Name: Orvile Corona MRN: [id number] Date of Birth: 1981-06-29  Subjective/Objective:                  abd pain, wcb=19.4/hgb=9.7/na=129/ o2 [place]/ iv ns/ iv ancef/ temp this am 102.5  Action/Plan: Following for progression of care and discharge planning No cm needs present at this time.  Expected Discharge Date:  (unknown)               Expected Discharge Plan:  Home/Self Care  In-House Referral:     Discharge planning Services  CM Consult  Post Acute Care Choice:    Choice offered to:     DME Arranged:    DME Agency:     HH Arranged:    HH Agency:     Status of Service:  In process, will continue to follow  If discussed at Long Length of Stay Meetings, dates discussed:    Additional Comments:  Golda Acre, RN 06/10/2018, 10:27 AM

## 2018-06-10 NOTE — Progress Notes (Addendum)
[facility]   Reason for visit: Follow up on bacteremia  Interval History: some pain, WBC, Tmax 102.5, WBC stable at 19.  Repeat blood cultures sent this am. No associated rash, diarrhea   Physical Exam: Constitutional:  Vitals:   06/10/18 0800 06/10/18 1200  BP: 116/77   Pulse: (!) 105   Resp: (!) 21   Temp: 99.7 F (37.6 C) 100 F (37.8 C)  SpO2: 93%    patient appears in NAD Eyes: anicteric HENT: no thrush Respiratory: Normal respiratory effort; CTA B Cardiovascular: RRR GI: soft, nt, nd  Review of Systems: Constitutional: positive for fevers and chills or negative for anorexia Respiratory: positive for pleurisy/chest pain Gastrointestinal: negative for diarrhea  Lab Results  Component Value Date   WBC 19.4 (H) 06/10/2018   HGB 9.7 (L) 06/10/2018   HCT 29.1 (L) 06/10/2018   MCV 86.4 06/10/2018   PLT 108 (L) 06/10/2018    Lab Results  Component Value Date   CREATININE 0.83 06/10/2018   BUN 22 (H) 06/10/2018   NA 129 (L) 06/10/2018   K 4.2 06/10/2018   CL 94 (L) 06/10/2018   CO2 27 06/10/2018    Lab Results  Component Value Date   ALT 36 06/10/2018   AST 72 (H) 06/10/2018   ALKPHOS 105 06/10/2018     Microbiology: Recent Results (from the past 240 hour(s))  Blood Culture (routine x 2)     Status: Abnormal (Preliminary result)   Collection Time: 06/08/18  7:47 AM  Result Value Ref Range Status   Specimen Description   Final    BLOOD RIGHT ARM Performed at [facility], [address] [place], [place] [address]    Special Requests   Final    BOTTLES DRAWN AEROBIC AND ANAEROBIC Blood Culture results may not be optimal due to an excessive volume of blood received in culture bottles Performed at [facility], [address], [place], [place] [address]    Culture  Setup Time   Final    GRAM POSITIVE COCCI IN BOTH AEROBIC AND ANAEROBIC BOTTLES CRITICAL RESULT CALLED TO, READ BACK BY  AND VERIFIED WITH: PHARMD N GLOGOVAC [phone] MLM    Culture (A)  Final    STAPHYLOCOCCUS AUREUS SUSCEPTIBILITIES TO FOLLOW GRAM POSITIVE RODS CRITICAL RESULT CALLED TO, READ BACK BY AND VERIFIED WITH: PHARMD MEREDITH R [id number] FCP Performed at [facility], [address], [place], [place] [address]    Report Status PENDING  Incomplete  Blood Culture ID Panel (Reflexed)     Status: Abnormal   Collection Time: 06/08/18  7:47 AM  Result Value Ref Range Status   Enterococcus species NOT DETECTED NOT DETECTED Final   Listeria monocytogenes NOT DETECTED NOT DETECTED Final   Staphylococcus species DETECTED (A) NOT DETECTED Final    Comment: CRITICAL RESULT CALLED TO, READ BACK BY AND VERIFIED WITH: PHARMD N GLOGOVAC [phone] MLM    Staphylococcus aureus (BCID) DETECTED (A) NOT DETECTED Final    Comment: Methicillin (oxacillin) susceptible Staphylococcus aureus (MSSA). Preferred therapy is anti staphylococcal beta lactam antibiotic (Cefazolin or Nafcillin), unless clinically contraindicated. CRITICAL RESULT CALLED TO, READ BACK BY AND VERIFIED WITH: PHARMD N GLOGOVAC [phone] MLM    Methicillin resistance NOT DETECTED NOT DETECTED Final   Streptococcus species NOT DETECTED NOT DETECTED Final   Streptococcus agalactiae NOT DETECTED NOT DETECTED Final   Streptococcus pneumoniae NOT DETECTED NOT DETECTED Final   Streptococcus  pyogenes NOT DETECTED NOT DETECTED Final   Acinetobacter baumannii NOT DETECTED NOT DETECTED Final   Enterobacteriaceae species NOT DETECTED NOT DETECTED Final   Enterobacter cloacae complex NOT DETECTED NOT DETECTED Final   Escherichia coli NOT DETECTED NOT DETECTED Final   Klebsiella oxytoca NOT DETECTED NOT DETECTED Final   Klebsiella pneumoniae NOT DETECTED NOT DETECTED Final   Proteus species NOT DETECTED NOT DETECTED Final   Serratia marcescens NOT DETECTED NOT DETECTED Final   Haemophilus influenzae NOT DETECTED NOT DETECTED Final    Neisseria meningitidis NOT DETECTED NOT DETECTED Final   Pseudomonas aeruginosa NOT DETECTED NOT DETECTED Final   Candida albicans NOT DETECTED NOT DETECTED Final   Candida glabrata NOT DETECTED NOT DETECTED Final   Candida krusei NOT DETECTED NOT DETECTED Final   Candida parapsilosis NOT DETECTED NOT DETECTED Final   Candida tropicalis NOT DETECTED NOT DETECTED Final    Comment: Performed at [facility], [address], [place], [place] [address]  Blood Culture (routine x 2)     Status: Abnormal (Preliminary result)   Collection Time: 06/08/18  7:55 AM  Result Value Ref Range Status   Specimen Description   Final    BLOOD RIGHT WRIST Performed at [facility], [address] [place], [place] [address]    Special Requests   Final    BOTTLES DRAWN AEROBIC AND ANAEROBIC Blood Culture results may not be optimal due to an excessive volume of blood received in culture bottles Performed at [facility], [address], [place], [place] [address]    Culture  Setup Time   Final    GRAM POSITIVE COCCI IN BOTH AEROBIC AND ANAEROBIC BOTTLES CRITICAL VALUE NOTED.  VALUE IS CONSISTENT WITH PREVIOUSLY REPORTED AND CALLED VALUE.    Culture (A)  Final    STAPHYLOCOCCUS AUREUS SUSCEPTIBILITIES TO FOLLOW Performed at [facility], [address] [place], [place] [address]    Report Status PENDING  Incomplete  Culture, sputum-assessment     Status: None   Collection Time: 06/08/18  3:06 PM  Result Value Ref Range Status   Specimen Description EXPECTORATED SPUTUM  Final   Special Requests NONE  Final   Sputum evaluation   Final    THIS SPECIMEN IS ACCEPTABLE FOR SPUTUM CULTURE Performed at [facility], [address], [place], [place] [address]    Report Status 06/08/2018 FINAL  Final  Culture, respiratory     Status: None (Preliminary result)   Collection Time: 06/08/18  3:06 PM  Result Value Ref Range Status   Specimen  Description   Final    EXPECTORATED SPUTUM Performed at [facility], [address], [place], [place] [address]    Special Requests   Final    NONE Reflexed from [id number] Performed at [facility], [address], [place], [place] [address]    Gram Stain   Final    ABUNDANT WBC PRESENT,BOTH PMN AND MONONUCLEAR FEW GRAM POSITIVE COCCI FEW GRAM NEGATIVE RODS FEW SQUAMOUS EPITHELIAL CELLS PRESENT    Culture   Final    FEW STAPHYLOCOCCUS AUREUS SUSCEPTIBILITIES TO FOLLOW Performed at [facility], [address], [place], [place] [address]    Report Status PENDING  Incomplete  MRSA PCR Screening     Status: None   Collection Time: 06/08/18  3:22 PM  Result Value Ref Range Status   MRSA by PCR NEGATIVE NEGATIVE Final    Comment:  The GeneXpert MRSA Assay (FDA approved for NASAL specimens only), is one component of a comprehensive MRSA colonization surveillance program. It is not intended to diagnose MRSA infection nor to guide or monitor treatment for MRSA infections. Performed at [facility], [address], [place], [place] [address]   Culture, blood (routine x 2)     Status: None (Preliminary result)   Collection Time: 06/10/18  3:14 AM  Result Value Ref Range Status   Specimen Description   Final    BLOOD LEFT ARM Performed at [facility], [address], [place], [place] [address]    Special Requests   Final    BOTTLES DRAWN AEROBIC ONLY Blood Culture adequate volume Performed at [facility], [address], [place], [place] [address]    Culture   Final    NO GROWTH < 12 HOURS Performed at [facility], [address], [place], [place] [address]    Report Status PENDING  Incomplete  Culture, blood (routine x 2)     Status: None (Preliminary result)   Collection Time: 06/10/18  3:14 AM  Result Value Ref Range Status   Specimen Description   Final     BLOOD LEFT HAND Performed at [facility], [address] [place], [place] [address]    Special Requests   Final    BOTTLES DRAWN AEROBIC ONLY Blood Culture results may not be optimal due to an inadequate volume of blood received in culture bottles Performed at [facility], [address], [place], [place] [address]    Culture   Final    NO GROWTH < 12 HOURS Performed at [facility], 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   Repeat cultures sent, on cefazolin Continue 6 weeks of IV cefazolin.    2.  Hepatitis C - could offer him treatment as outpatient  3.  Substance abuse - on suboxone   Dr. Orvan Falconer available over the weekend if needed, otherwise I will follow up on Monday

## 2018-06-10 NOTE — Progress Notes (Addendum)
PROGRESS NOTE  Dwyne Hasegawa  QIO:[id number] DOB: Jul 17, 1981 DOA: 06/08/2018 PCP: Patient, No Pcp Per   Brief Narrative: Dezmond Downie is a 37 y.o. male with a history of tobacco use, IV heroin use, untreated bipolar disorder, and chronic hepatitis C who presented 10/2 with several days of fever, body aches, abdominal discomfort, pleuritic chest pain and mild cough. In the ED he was febrile to 103F with petechial rash to the lower extremities and hypoxia requiring 3-4L O2. CT abdomen noted no significant intraabdominal pathology, but demonstrated bilateral cavitary pulmonary nodules with CXR also showing multifocal pneumonia. 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 TEE recommended to confirm which is scheduled for 10/7. ID consulted and recommended 6 weeks IV ancef.  Assessment & Plan: Principal Problem:   Substance induced mood disorder (HCC) Active Problems:   Acute on chronic respiratory failure with hypoxia (HCC)   Polysubstance abuse (HCC)   Alcohol dependence with unspecified alcohol-induced disorder (HCC)   Abdominal pain   Hepatitis C, chronic (HCC)   Endocarditis of tricuspid valve   Septic embolism (HCC)   Multifocal pneumonia   Tobacco abuse  Sepsis due to MSSA bacteremia and tricuspid endocarditis:  - ID consulted, recommending 6 weeks of IV ancef - Per echocardiogram read, recommending TEE for confirmation, scheduled 10/7 at [facility]. - Repeated blood cultures 10/4, monitor for clearance. - Getting peripheral edema and had RV overload suggested on echo. Will stop IV fluids and monitor I/O  Acute RV failure, acute cor pulmonale secondary to septic pulmonary embolism: - Stop IVF - Daily weights, strict I/O  Acute hypoxic respiratory failure due to multifocal pneumonia from septic emboli: - Continue supplemental oxygen and treating infection as above.  - Can deescalate to SDU status given  improvement in BP but still with septic pulmonary emboli, tenuous respiratory status requiring close monitoring.  IV heroin use, opioid use disorder:  - Last heroin use was morning of 10/2. Started on suboxone, and prn tramadol prn. COWS scores still elevated, will continue these, hold for sedation.  - Consider outpatient follow up pending clinical course.  - Not a candidate for outpatient IV therapy.   Alcohol use, polysubstance abuse:  - Monitor closely    Hyponatremia: Improving slowly with isotonic saline consistent with dehydration.  - Monitor daily, stop IVF as above.  Abdominal pain: Unclear cause with negative CT and benign labs. Would not expect systemic emboli with only tricuspid involvement.  - Monitor clinically  Bipolar disorder:  - Psychiatry consulted, recommending restart risperidone 1mg  qHS which is ordered. QTc improved to on last ECG 10/3.   Tobacco use:  - Declined nicotine patch.  - Cessation counseling provided for this and other substances.   Left hand/joint pain: Negative XR and reassuring exam. Possibly related to opioid withdrawal. Also ?if left heart involvement and microemboli though exam shows no nodules, splinter hemorrhages, etc.  - Monitor  DVT prophylaxis: Lovenox Code Status: Full Family Communication: None at bedside Disposition Plan: Uncertain, guarded prognosis  Consultants:   Infectious disease  Procedures:   Echocardiogram 06/08/2018:  - Left ventricle: The cavity size was normal. Wall thickness was   normal. Systolic function was normal. The estimated ejection   fraction was in the range of 55% to 60%. Wall motion was normal;   there were no regional wall motion abnormalities. Left   ventricular diastolic function parameters were normal. - Ventricular septum: Very mildly D-shaped interventricular septum   is concerning for RV pressure/volume  overload. - Aortic valve: There was no stenosis. - Mitral valve: There was no  regurgitation. - Right ventricle: The cavity size was mildly dilated. Systolic   function was normal. - Right atrium: The atrium was mildly dilated. - Tricuspid valve: There was mild-moderate regurgitation. Suspected   tricuspid vegetation. Peak RV-RA gradient (S): 25 mm Hg. - Pulmonary arteries: PA peak pressure: 33 mm Hg (S). - Systemic veins: IVC measured 2.3 cm with > 50% respirophasic   variation, suggesting RA pressure 8 mmHg. - Pericardium, extracardiac: Small circumferential pericardial   effusion.  Impressions: - Normal LV size and systolic function, EF 55-60%. Normal diastolic   function. Mildly D-shaped interventricular septum suggesting a   degree of RV pressure/volume overload. Mildly dilated RV with   normal systolic function. Mild to moderate TR. There does appear   to be a tricuspid valve vegetation. Need TEE to confirm.  Antimicrobials:  Vancomycin, cefepime, flagyl 10/2 - 10/3  Ancef 10/3 >>   Subjective: Feels he's breathing a bit better, eating some. Joint aches all over remain, worst on left hand but had negative XR. Chest pain with breathing is significant and stable, improved with tramadol.    Objective: Vitals:   06/10/18 0500 06/10/18 0600 06/10/18 0800 06/10/18 1200  BP: 112/70 132/80 116/77   Pulse: 95 95 (!) 105   Resp: (!) 26 (!) 34 (!) 21   Temp:   99.7 F (37.6 C) 100 F (37.8 C)  TempSrc:   Oral Oral  SpO2: 94% 96% 93%   Weight: 80 kg     Height:        Intake/Output Summary (Last 24 hours) at 06/10/2018 1443 Last data filed at 06/10/2018 0841 Gross per 24 hour  Intake 1367.46 ml  Output 1700 ml  Net -332.54 ml   Filed Weights   06/08/18 0755 06/09/18 0418 06/10/18 0500  Weight: 72.6 kg 74.7 kg 80 kg   Gen: Acutely ill-appearing male in no distress Pulm: Tachypneic with supplemental oxygen. No crackles or wheezes. CV: Regular tachycardia. No murmur, rub, or gallop. No JVD, 1+ pitting dependent edema. GI: Abdomen soft, non-tender,  non-distended, with normoactive bowel sounds.  Ext: Warm, no deformities. Left hand remains with full AROM, sensation intact, pulsations 2+ at radial sites bilaterally.  Skin: No new rashes, lesions or ulcers on visualized skin. LE petechiae resolving. Upper extremity track marks stable, no abscesses.  Neuro: Drowsy but rousable and conversant, not restless. No focal neurological deficits. Psych: Judgement and insight appear impaired. Mood euthymic & affect congruent.    Data Reviewed: I have personally reviewed following labs and imaging studies  CBC: Recent Labs  Lab 06/08/18 0742 06/09/18 0315 06/10/18 0314  WBC 21.2* 19.1* 19.4*  NEUTROABS 18.8  --   --   HGB 12.4* 10.2* 9.7*  HCT 35.9* 29.7* 29.1*  MCV 84.9 85.6 86.4  PLT 103* 104* 108*   Basic Metabolic Panel: Recent Labs  Lab 06/08/18 0742 06/09/18 0315 06/10/18 0314  NA 126* 128* 129*  K 3.8 3.9 4.2  CL 82* 92* 94*  CO2 29 27 27   GLUCOSE 96 112* 115*  BUN 22* 18 22*  CREATININE 0.91 0.69 0.83  CALCIUM 8.3* 7.2* 7.1*   GFR: Estimated Creatinine Clearance: 127.3 mL/min (by C-G formula based on SCr of 0.83 mg/dL). Liver Function Tests: Recent Labs  Lab 06/08/18 0742 06/10/18 0314  AST 191* 72*  ALT 87* 36  ALKPHOS 122 105  BILITOT 3.9* 1.3*  PROT 6.1* 4.6*  ALBUMIN 2.2*  1.4*   Recent Labs  Lab 06/08/18 0742  LIPASE 29   No results for input(s): AMMONIA in the last 168 hours. Coagulation Profile: No results for input(s): INR, PROTIME in the last 168 hours. Cardiac Enzymes: No results for input(s): CKTOTAL, CKMB, CKMBINDEX, TROPONINI in the last 168 hours. BNP (last 3 results) No results for input(s): PROBNP in the last 8760 hours. HbA1C: No results for input(s): HGBA1C in the last 72 hours. CBG: No results for input(s): GLUCAP in the last 168 hours. Lipid Profile: No results for input(s): CHOL, HDL, LDLCALC, TRIG, CHOLHDL, LDLDIRECT in the last 72 hours. Thyroid Function Tests: No results for  input(s): TSH, T4TOTAL, FREET4, T3FREE, THYROIDAB in the last 72 hours. Anemia Panel: No results for input(s): VITAMINB12, FOLATE, FERRITIN, TIBC, IRON, RETICCTPCT in the last 72 hours. Urine analysis:    Component Value Date/Time   COLORURINE AMBER (A) 06/08/2018 0811   APPEARANCEUR HAZY (A) 06/08/2018 0811   LABSPEC 1.017 06/08/2018 0811   PHURINE 5.0 06/08/2018 0811   GLUCOSEU NEGATIVE 06/08/2018 0811   HGBUR MODERATE (A) 06/08/2018 0811   BILIRUBINUR NEGATIVE 06/08/2018 0811   KETONESUR NEGATIVE 06/08/2018 0811   PROTEINUR 30 (A) 06/08/2018 0811   UROBILINOGEN 2.0 (H) 12/17/2013 0848   NITRITE NEGATIVE 06/08/2018 0811   LEUKOCYTESUR NEGATIVE 06/08/2018 0811   Recent Results (from the past 240 hour(s))  Blood Culture (routine x 2)     Status: Abnormal (Preliminary result)   Collection Time: 06/08/18  7:47 AM  Result Value Ref Range Status   Specimen Description   Final    BLOOD RIGHT ARM Performed at [facility], [address], [place], [place] [address]    Special Requests   Final    BOTTLES DRAWN AEROBIC AND ANAEROBIC Blood Culture results may not be optimal due to an excessive volume of blood received in culture bottles Performed at [facility], [address] [place], [place] [address]    Culture  Setup Time   Final    GRAM POSITIVE COCCI IN BOTH AEROBIC AND ANAEROBIC BOTTLES CRITICAL RESULT CALLED TO, READ BACK BY AND VERIFIED WITH: PHARMD N GLOGOVAC [phone] MLM    Culture (A)  Final    STAPHYLOCOCCUS AUREUS SUSCEPTIBILITIES TO FOLLOW GRAM POSITIVE RODS CRITICAL RESULT CALLED TO, READ BACK BY AND VERIFIED WITH: PHARMD MEREDITH R [id number] [id number] FCP Performed at [facility], [address] [place], [place] [address]    Report Status PENDING  Incomplete  Blood Culture ID Panel (Reflexed)     Status: Abnormal   Collection Time: 06/08/18  7:47 AM  Result Value Ref Range Status   Enterococcus species NOT DETECTED NOT  DETECTED Final   Listeria monocytogenes NOT DETECTED NOT DETECTED Final   Staphylococcus species DETECTED (A) NOT DETECTED Final    Comment: CRITICAL RESULT CALLED TO, READ BACK BY AND VERIFIED WITH: PHARMD N GLOGOVAC [phone] MLM    Staphylococcus aureus (BCID) DETECTED (A) NOT DETECTED Final    Comment: Methicillin (oxacillin) susceptible Staphylococcus aureus (MSSA). Preferred therapy is anti staphylococcal beta lactam antibiotic (Cefazolin or Nafcillin), unless clinically contraindicated. CRITICAL RESULT CALLED TO, READ BACK BY AND VERIFIED WITH: PHARMD N GLOGOVAC [phone] MLM    Methicillin resistance NOT DETECTED NOT DETECTED Final   Streptococcus species NOT DETECTED NOT DETECTED Final   Streptococcus agalactiae NOT DETECTED NOT DETECTED Final   Streptococcus pneumoniae NOT DETECTED NOT DETECTED Final   Streptococcus pyogenes NOT DETECTED NOT DETECTED Final  Acinetobacter baumannii NOT DETECTED NOT DETECTED Final   Enterobacteriaceae species NOT DETECTED NOT DETECTED Final   Enterobacter cloacae complex NOT DETECTED NOT DETECTED Final   Escherichia coli NOT DETECTED NOT DETECTED Final   Klebsiella oxytoca NOT DETECTED NOT DETECTED Final   Klebsiella pneumoniae NOT DETECTED NOT DETECTED Final   Proteus species NOT DETECTED NOT DETECTED Final   Serratia marcescens NOT DETECTED NOT DETECTED Final   Haemophilus influenzae NOT DETECTED NOT DETECTED Final   Neisseria meningitidis NOT DETECTED NOT DETECTED Final   Pseudomonas aeruginosa NOT DETECTED NOT DETECTED Final   Candida albicans NOT DETECTED NOT DETECTED Final   Candida glabrata NOT DETECTED NOT DETECTED Final   Candida krusei NOT DETECTED NOT DETECTED Final   Candida parapsilosis NOT DETECTED NOT DETECTED Final   Candida tropicalis NOT DETECTED NOT DETECTED Final    Comment: Performed at [facility], [address], [place], [place] [address]  Blood Culture (routine x 2)     Status: Abnormal (Preliminary  result)   Collection Time: 06/08/18  7:55 AM  Result Value Ref Range Status   Specimen Description   Final    BLOOD RIGHT WRIST Performed at [facility], [address] [place], [place] [address]    Special Requests   Final    BOTTLES DRAWN AEROBIC AND ANAEROBIC Blood Culture results may not be optimal due to an excessive volume of blood received in culture bottles Performed at [facility], [address], [place], [place] [address]    Culture  Setup Time   Final    GRAM POSITIVE COCCI IN BOTH AEROBIC AND ANAEROBIC BOTTLES CRITICAL VALUE NOTED.  VALUE IS CONSISTENT WITH PREVIOUSLY REPORTED AND CALLED VALUE.    Culture (A)  Final    STAPHYLOCOCCUS AUREUS SUSCEPTIBILITIES TO FOLLOW Performed at [facility], [address], [place], [place] [address]    Report Status PENDING  Incomplete  Culture, sputum-assessment     Status: None   Collection Time: 06/08/18  3:06 PM  Result Value Ref Range Status   Specimen Description EXPECTORATED SPUTUM  Final   Special Requests NONE  Final   Sputum evaluation   Final    THIS SPECIMEN IS ACCEPTABLE FOR SPUTUM CULTURE Performed at [facility], [address], [place], [place] [address]    Report Status 06/08/2018 FINAL  Final  Culture, respiratory     Status: None (Preliminary result)   Collection Time: 06/08/18  3:06 PM  Result Value Ref Range Status   Specimen Description   Final    EXPECTORATED SPUTUM Performed at [facility], [address] [place], [place] [address]    Special Requests   Final    NONE Reflexed from [id number] Performed at [facility], [address] [place], [place] [address]    Gram Stain   Final    ABUNDANT WBC PRESENT,BOTH PMN AND MONONUCLEAR FEW GRAM POSITIVE COCCI FEW GRAM NEGATIVE RODS FEW SQUAMOUS EPITHELIAL CELLS PRESENT    Culture   Final    FEW STAPHYLOCOCCUS AUREUS SUSCEPTIBILITIES TO  FOLLOW Performed at [facility], [address], [place], [place] [address]    Report Status PENDING  Incomplete  MRSA PCR Screening     Status: None   Collection Time: 06/08/18  3:22 PM  Result Value Ref Range Status   MRSA by PCR NEGATIVE NEGATIVE Final    Comment:        The  GeneXpert MRSA Assay (FDA approved for NASAL specimens only), is one component of a comprehensive MRSA colonization surveillance program. It is not intended to diagnose MRSA infection nor to guide or monitor treatment for MRSA infections. Performed at [facility], [address] [place], [place] [address]   Culture, blood (routine x 2)     Status: None (Preliminary result)   Collection Time: 06/10/18  3:14 AM  Result Value Ref Range Status   Specimen Description   Final    BLOOD LEFT ARM Performed at [facility], [address], [place], [place] [address]    Special Requests   Final    BOTTLES DRAWN AEROBIC ONLY Blood Culture adequate volume Performed at [facility], [address] [place], [place] [address]    Culture   Final    NO GROWTH < 12 HOURS Performed at [facility], [address], [place], [place] [address]    Report Status PENDING  Incomplete  Culture, blood (routine x 2)     Status: None (Preliminary result)   Collection Time: 06/10/18  3:14 AM  Result Value Ref Range Status   Specimen Description   Final    BLOOD LEFT HAND Performed at [facility], [address], [place], [place] [address]    Special Requests   Final    BOTTLES DRAWN AEROBIC ONLY Blood Culture results may not be optimal due to an inadequate volume of blood received in culture bottles Performed at [facility], [address], [place], [place] [address]    Culture   Final    NO GROWTH < 12 HOURS Performed at [facility], [address] [place], [place] [address]    Report Status PENDING   Incomplete      Radiology Studies: No results found.  Scheduled Meds: . buprenorphine-naloxone  1 tablet Sublingual BID  . enoxaparin (LOVENOX) injection  40 mg Subcutaneous Q24H  . mouth rinse  15 mL Mouth Rinse BID  . risperiDONE  1 mg Oral QHS   Continuous Infusions: . sodium chloride 100 mL/hr at 06/10/18 1102  . sodium chloride 250 mL (06/10/18 1438)  .  ceFAZolin (ANCEF) IV 2 g (06/10/18 1440)     LOS: 2 days   Time spent: 35 minutes.  Tyrone Nine, MD [facility] [email or web address] Password [email or web address] 06/10/2018, 2:43 PM

## 2018-06-10 NOTE — Progress Notes (Signed)
Pt has large blister on right foot, states that he's had it for over a week. Pt popped blister, purulent, bloody drainage out. Cleansed with saline, placed dry 4x4 gauze over wound, secured with hypafix tape. Instructed pt not to touch blister.

## 2018-06-11 LAB — CULTURE, BLOOD (ROUTINE X 2)

## 2018-06-11 LAB — BASIC METABOLIC PANEL
ANION GAP: 9 (ref 5–15)
BUN: 21 mg/dL — ABNORMAL HIGH (ref 6–20)
CHLORIDE: 92 mmol/L — AB (ref 98–111)
CO2: 27 mmol/L (ref 22–32)
Calcium: 7.2 mg/dL — ABNORMAL LOW (ref 8.9–10.3)
Creatinine, Ser: 1.01 mg/dL (ref 0.61–1.24)
GFR calc Af Amer: >60 mL/min (ref >60)
Glucose, Bld: 88 mg/dL (ref 70–99)
POTASSIUM: 3.7 mmol/L (ref 3.5–5.1)
SODIUM: 128 mmol/L — AB (ref 135–145)

## 2018-06-11 LAB — CULTURE, RESPIRATORY

## 2018-06-11 LAB — CULTURE, RESPIRATORY W GRAM STAIN

## 2018-06-11 LAB — CBC
HCT: 28.4 % — ABNORMAL LOW (ref 39.0–52.0)
HEMOGLOBIN: 9.6 g/dL — AB (ref 13.0–17.0)
MCH: 29.3 pg (ref 26.0–34.0)
MCHC: 33.8 g/dL (ref 30.0–36.0)
MCV: 86.6 fL (ref 78.0–100.0)
PLATELETS: 157 10*3/uL (ref 150–400)
RBC: 3.28 MIL/uL — ABNORMAL LOW (ref 4.22–5.81)
RDW: 14.2 % (ref 11.5–15.5)
WBC: 18.5 10*3/uL — ABNORMAL HIGH (ref 4.0–10.5)

## 2018-06-11 NOTE — Progress Notes (Signed)
PROGRESS NOTE  Oscar Burke  ZOX:[id number] DOB: 10/26/1980 DOA: 06/08/2018 PCP: Patient, No Pcp Per   Brief Narrative: Oscar Burke is a 37 y.o. male with a history of tobacco use, IV heroin use, untreated bipolar disorder, and chronic hepatitis C who presented 10/2 with several days of fever, body aches, abdominal discomfort, pleuritic chest pain and mild cough. In the ED he was febrile to 103F with petechial rash to the lower extremities and hypoxia requiring 3-4L O2. CT abdomen noted no significant intraabdominal pathology, but demonstrated bilateral cavitary pulmonary nodules with CXR also showing multifocal pneumonia. 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 TEE recommended to confirm which is scheduled for 10/7. ID consulted and recommended 6 weeks IV ancef.  Assessment & Plan: Principal Problem:   Substance induced mood disorder (HCC) Active Problems:   Acute on chronic respiratory failure with hypoxia (HCC)   Polysubstance abuse (HCC)   Alcohol dependence with unspecified alcohol-induced disorder (HCC)   Abdominal pain   Hepatitis C, chronic (HCC)   Endocarditis of tricuspid valve   Septic embolism (HCC)   Multifocal pneumonia   Tobacco abuse  Sepsis due to MSSA bacteremia and tricuspid endocarditis:  - ID consulted, recommending 6 weeks of IV ancef - Per echocardiogram read, recommending TEE for confirmation, scheduled 10/7 at [facility]. - Repeated blood cultures 10/4, NGTD, monitor for clearance. - Stopped IV fluids due to edema as below.  Acute RV failure, acute cor pulmonale secondary to septic pulmonary emboli: - Currently stable now that IV fluids have been stopped, edema improving. - Daily weights, strict I/O  Acute hypoxic respiratory failure due to multifocal pneumonia from septic emboli: - Continue supplemental oxygen and treating infection as above.  IV heroin use, opioid use  disorder:  - Last heroin use was morning of 10/2. Started on suboxone, and prn tramadol prn. COWS scores still elevated, will continue these, hold for sedation.  - Consider outpatient follow up pending clinical course.  - Not a candidate for outpatient IV therapy.   Alcohol use, polysubstance abuse:  - Monitor closely    Hyponatremia: Improving slowly with isotonic saline consistent with dehydration.  - Monitor daily, stopped IVF  Abdominal pain: Unclear cause with negative CT and benign labs. Would not expect systemic emboli with only tricuspid involvement. ?radiation of pleuritic pain. - Monitor clinically  Bipolar disorder:  - Psychiatry consulted, recommending restart risperidone 1mg  qHS which is ordered. QTc improved to on last ECG 10/3.  - Needs to follow up with [facility] as outpatient per psychiatry.  Tobacco use:  - Declined nicotine patch.  - Cessation counseling provided for this and other substances.   Left hand/joint pain: Negative XR and reassuring exam. Possibly related to opioid withdrawal. Also ?if left heart involvement and microemboli though exam shows no nodules, splinter hemorrhages, etc.  - Monitor  DVT prophylaxis: Lovenox Code Status: Full Family Communication: None at bedside Disposition Plan: Uncertain, guarded prognosis. Continue SDU level of care.  Consultants:   Infectious disease  Procedures:   Echocardiogram 06/08/2018:  - Left ventricle: The cavity size was normal. Wall thickness was   normal. Systolic function was normal. The estimated ejection   fraction was in the range of 55% to 60%. Wall motion was normal;   there were no regional wall motion abnormalities. Left   ventricular diastolic function parameters were normal. - Ventricular septum: Very mildly D-shaped interventricular septum   is concerning for RV pressure/volume overload. - Aortic valve: There  was no stenosis. - Mitral valve: There was no regurgitation. - Right  ventricle: The cavity size was mildly dilated. Systolic   function was normal. - Right atrium: The atrium was mildly dilated. - Tricuspid valve: There was mild-moderate regurgitation. Suspected   tricuspid vegetation. Peak RV-RA gradient (S): 25 mm Hg. - Pulmonary arteries: PA peak pressure: 33 mm Hg (S). - Systemic veins: IVC measured 2.3 cm with > 50% respirophasic   variation, suggesting RA pressure 8 mmHg. - Pericardium, extracardiac: Small circumferential pericardial   effusion.  Impressions: - Normal LV size and systolic function, EF 55-60%. Normal diastolic   function. Mildly D-shaped interventricular septum suggesting a   degree of RV pressure/volume overload. Mildly dilated RV with   normal systolic function. Mild to moderate TR. There does appear   to be a tricuspid valve vegetation. Need TEE to confirm.  Antimicrobials:  Vancomycin, cefepime, flagyl 10/2 - 10/3  Ancef 10/3 >>   Subjective: More rousable today and interactive. This is the first day he's expressed desire not to remain inpatient for 6 weeks. Willing to stay for a while though. No subjective dyspnea. Pleuritic pain is stable and abdominal pain is stable, not changed with foods/fluids.   Objective: Vitals:   06/11/18 0347 06/11/18 0400 06/11/18 0500 06/11/18 0600  BP:  (!) 102/46  (!) 124/57  Pulse:  (!) 107  (!) 106  Resp:  (!) 21  (!) 21  Temp: (!) 100.6 F (38.1 C)     TempSrc: Oral     SpO2:  92%  95%  Weight:   77 kg   Height:        Intake/Output Summary (Last 24 hours) at 06/11/2018 0843 Last data filed at 06/11/2018 0756 Gross per 24 hour  Intake 1134.75 ml  Output 1475 ml  Net -340.25 ml   Filed Weights   06/09/18 0418 06/10/18 0500 06/11/18 0500  Weight: 74.7 kg 80 kg 77 kg   Gen: Acutely and chronically ill-appearing male in no distress Pulm: Tachypneic with 2L O2. Clear. CV: Regular tachycardia. Very soft flow murmur at base, no rub, or gallop. No JVD, trace (improved) dependent  edema. GI: Abdomen soft, generally tender mildly without rebound or guarding, non-distended, with normoactive bowel sounds.  Ext: Warm, no deformities Skin: Right foot with unroofed blister, no discharge. Diffuse tattoos and track marks noted, stable. No other rashes, lesions or ulcers on visualized skin.  Neuro: Alert and oriented. No focal neurological deficits. Psych: Judgement and insight appear impaired.   Data Reviewed: I have personally reviewed following labs and imaging studies  CBC: Recent Labs  Lab 06/08/18 0742 06/09/18 0315 06/10/18 0314 06/11/18 0326  WBC 21.2* 19.1* 19.4* 18.5*  NEUTROABS 18.8  --   --   --   HGB 12.4* 10.2* 9.7* 9.6*  HCT 35.9* 29.7* 29.1* 28.4*  MCV 84.9 85.6 86.4 86.6  PLT 103* 104* 108* 157   Basic Metabolic Panel: Recent Labs  Lab 06/08/18 0742 06/09/18 0315 06/10/18 0314 06/11/18 0326  NA 126* 128* 129* 128*  K 3.8 3.9 4.2 3.7  CL 82* 92* 94* 92*  CO2 29 27 27 27   GLUCOSE 96 112* 115* 88  BUN 22* 18 22* 21*  CREATININE 0.91 0.69 0.83 1.01  CALCIUM 8.3* 7.2* 7.1* 7.2*   GFR: Estimated Creatinine Clearance: 104.6 mL/min (by C-G formula based on SCr of 1.01 mg/dL). Liver Function Tests: Recent Labs  Lab 06/08/18 0742 06/10/18 0314  AST 191* 72*  ALT 87* 36  ALKPHOS 122 105  BILITOT 3.9* 1.3*  PROT 6.1* 4.6*  ALBUMIN 2.2* 1.4*   Recent Labs  Lab 06/08/18 0742  LIPASE 29   No results for input(s): AMMONIA in the last 168 hours. Coagulation Profile: No results for input(s): INR, PROTIME in the last 168 hours. Cardiac Enzymes: No results for input(s): CKTOTAL, CKMB, CKMBINDEX, TROPONINI in the last 168 hours. BNP (last 3 results) No results for input(s): PROBNP in the last 8760 hours. HbA1C: No results for input(s): HGBA1C in the last 72 hours. CBG: No results for input(s): GLUCAP in the last 168 hours. Lipid Profile: No results for input(s): CHOL, HDL, LDLCALC, TRIG, CHOLHDL, LDLDIRECT in the last 72 hours. Thyroid  Function Tests: No results for input(s): TSH, T4TOTAL, FREET4, T3FREE, THYROIDAB in the last 72 hours. Anemia Panel: No results for input(s): VITAMINB12, FOLATE, FERRITIN, TIBC, IRON, RETICCTPCT in the last 72 hours. Urine analysis:    Component Value Date/Time   COLORURINE AMBER (A) 06/08/2018 0811   APPEARANCEUR HAZY (A) 06/08/2018 0811   LABSPEC 1.017 06/08/2018 0811   PHURINE 5.0 06/08/2018 0811   GLUCOSEU NEGATIVE 06/08/2018 0811   HGBUR MODERATE (A) 06/08/2018 0811   BILIRUBINUR NEGATIVE 06/08/2018 0811   KETONESUR NEGATIVE 06/08/2018 0811   PROTEINUR 30 (A) 06/08/2018 0811   UROBILINOGEN 2.0 (H) 12/17/2013 0848   NITRITE NEGATIVE 06/08/2018 0811   LEUKOCYTESUR NEGATIVE 06/08/2018 0811   Recent Results (from the past 240 hour(s))  Blood Culture (routine x 2)     Status: Abnormal (Preliminary result)   Collection Time: 06/08/18  7:47 AM  Result Value Ref Range Status   Specimen Description   Final    BLOOD RIGHT ARM Performed at [facility], [address] [place], [place] [address]    Special Requests   Final    BOTTLES DRAWN AEROBIC AND ANAEROBIC Blood Culture results may not be optimal due to an excessive volume of blood received in culture bottles Performed at [facility], [address], [place], [place] [address]    Culture  Setup Time   Final    GRAM POSITIVE COCCI IN BOTH AEROBIC AND ANAEROBIC BOTTLES CRITICAL RESULT CALLED TO, READ BACK BY AND VERIFIED WITH: PHARMD N GLOGOVAC [phone] MLM    Culture (A)  Final    STAPHYLOCOCCUS AUREUS SUSCEPTIBILITIES TO FOLLOW GRAM POSITIVE RODS CRITICAL RESULT CALLED TO, READ BACK BY AND VERIFIED WITH: PHARMD MEREDITH R [phone] FCP Performed at [facility], [address] [place], [place] [address]    Report Status PENDING  Incomplete  Blood Culture ID Panel (Reflexed)     Status: Abnormal   Collection Time: 06/08/18  7:47 AM  Result Value Ref Range Status    Enterococcus species NOT DETECTED NOT DETECTED Final   Listeria monocytogenes NOT DETECTED NOT DETECTED Final   Staphylococcus species DETECTED (A) NOT DETECTED Final    Comment: CRITICAL RESULT CALLED TO, READ BACK BY AND VERIFIED WITH: PHARMD N GLOGOVAC [phone] MLM    Staphylococcus aureus (BCID) DETECTED (A) NOT DETECTED Final    Comment: Methicillin (oxacillin) susceptible Staphylococcus aureus (MSSA). Preferred therapy is anti staphylococcal beta lactam antibiotic (Cefazolin or Nafcillin), unless clinically contraindicated. CRITICAL RESULT CALLED TO, READ BACK BY AND VERIFIED WITH: PHARMD N GLOGOVAC [phone] MLM    Methicillin resistance NOT DETECTED NOT DETECTED Final   Streptococcus species NOT DETECTED NOT DETECTED Final   Streptococcus agalactiae NOT DETECTED NOT DETECTED Final   Streptococcus pneumoniae NOT  DETECTED NOT DETECTED Final   Streptococcus pyogenes NOT DETECTED NOT DETECTED Final   Acinetobacter baumannii NOT DETECTED NOT DETECTED Final   Enterobacteriaceae species NOT DETECTED NOT DETECTED Final   Enterobacter cloacae complex NOT DETECTED NOT DETECTED Final   Escherichia coli NOT DETECTED NOT DETECTED Final   Klebsiella oxytoca NOT DETECTED NOT DETECTED Final   Klebsiella pneumoniae NOT DETECTED NOT DETECTED Final   Proteus species NOT DETECTED NOT DETECTED Final   Serratia marcescens NOT DETECTED NOT DETECTED Final   Haemophilus influenzae NOT DETECTED NOT DETECTED Final   Neisseria meningitidis NOT DETECTED NOT DETECTED Final   Pseudomonas aeruginosa NOT DETECTED NOT DETECTED Final   Candida albicans NOT DETECTED NOT DETECTED Final   Candida glabrata NOT DETECTED NOT DETECTED Final   Candida krusei NOT DETECTED NOT DETECTED Final   Candida parapsilosis NOT DETECTED NOT DETECTED Final   Candida tropicalis NOT DETECTED NOT DETECTED Final    Comment: Performed at [facility], [address], [place], [place] [address]  Blood Culture (routine x 2)      Status: Abnormal (Preliminary result)   Collection Time: 06/08/18  7:55 AM  Result Value Ref Range Status   Specimen Description   Final    BLOOD RIGHT WRIST Performed at [facility], [address], [place], [place] [address]    Special Requests   Final    BOTTLES DRAWN AEROBIC AND ANAEROBIC Blood Culture results may not be optimal due to an excessive volume of blood received in culture bottles Performed at [facility], [address], [place], [place] [address]    Culture  Setup Time   Final    GRAM POSITIVE COCCI IN BOTH AEROBIC AND ANAEROBIC BOTTLES CRITICAL VALUE NOTED.  VALUE IS CONSISTENT WITH PREVIOUSLY REPORTED AND CALLED VALUE.    Culture (A)  Final    STAPHYLOCOCCUS AUREUS SUSCEPTIBILITIES TO FOLLOW Performed at [facility], [address] [place], [place] [address]    Report Status PENDING  Incomplete  Culture, sputum-assessment     Status: None   Collection Time: 06/08/18  3:06 PM  Result Value Ref Range Status   Specimen Description EXPECTORATED SPUTUM  Final   Special Requests NONE  Final   Sputum evaluation   Final    THIS SPECIMEN IS ACCEPTABLE FOR SPUTUM CULTURE Performed at [facility], [address], [place], [place] [address]    Report Status 06/08/2018 FINAL  Final  Culture, respiratory     Status: None (Preliminary result)   Collection Time: 06/08/18  3:06 PM  Result Value Ref Range Status   Specimen Description   Final    EXPECTORATED SPUTUM Performed at [facility], [address], [place], [place] [address]    Special Requests   Final    NONE Reflexed from [phone] Performed at [facility], [address] [place], [place] [address]    Gram Stain   Final    ABUNDANT WBC PRESENT,BOTH PMN AND MONONUCLEAR FEW GRAM POSITIVE COCCI FEW GRAM NEGATIVE RODS FEW SQUAMOUS EPITHELIAL CELLS PRESENT    Culture   Final    FEW STAPHYLOCOCCUS  AUREUS SUSCEPTIBILITIES TO FOLLOW Performed at [facility], [address], [place], [place] [address]    Report Status PENDING  Incomplete  MRSA PCR Screening     Status: None   Collection Time: 06/08/18  3:22 PM  Result Value Ref Range Status   MRSA by PCR  NEGATIVE NEGATIVE Final    Comment:        The GeneXpert MRSA Assay (FDA approved for NASAL specimens only), is one component of a comprehensive MRSA colonization surveillance program. It is not intended to diagnose MRSA infection nor to guide or monitor treatment for MRSA infections. Performed at [facility] [address], [place], [place] [address]   Culture, blood (routine x 2)     Status: None (Preliminary result)   Collection Time: 06/10/18  3:14 AM  Result Value Ref Range Status   Specimen Description   Final    BLOOD LEFT ARM Performed at [facility], [address], [place], [place] [address]    Special Requests   Final    BOTTLES DRAWN AEROBIC ONLY Blood Culture adequate volume Performed at [facility], [address] [place], [place] [address]    Culture   Final    NO GROWTH < 12 HOURS Performed at [facility], [address], [place], [place] [address]    Report Status PENDING  Incomplete  Culture, blood (routine x 2)     Status: None (Preliminary result)   Collection Time: 06/10/18  3:14 AM  Result Value Ref Range Status   Specimen Description   Final    BLOOD LEFT HAND Performed at [facility], [address] [place], [place] [address]    Special Requests   Final    BOTTLES DRAWN AEROBIC ONLY Blood Culture results may not be optimal due to an inadequate volume of blood received in culture bottles Performed at [facility], [address] [place], [place] [address]    Culture   Final    NO GROWTH < 12 HOURS Performed at [facility], [address] [place], [place] [address]     Report Status PENDING  Incomplete      Radiology Studies: No results found.  Scheduled Meds: . buprenorphine-naloxone  1 tablet Sublingual BID  . enoxaparin (LOVENOX) injection  40 mg Subcutaneous Q24H  . mouth rinse  15 mL Mouth Rinse BID  . risperiDONE  1 mg Oral QHS   Continuous Infusions: . sodium chloride 10 mL/hr at 06/10/18 1700  .  ceFAZolin (ANCEF) IV Stopped (06/11/18 0531)     LOS: 3 days   Time spent: 35 minutes.  Tyrone Nine, MD [facility] [email or web address] Password [facility] 06/11/2018, 8:43 AM

## 2018-06-12 LAB — BILIRUBIN, FRACTIONATED(TOT/DIR/INDIR)
Bilirubin, Direct: 0.9 mg/dL — ABNORMAL HIGH (ref 0.0–0.2)
Indirect Bilirubin: 0.7 mg/dL (ref 0.3–0.9)
Total Bilirubin: 1.6 mg/dL — ABNORMAL HIGH (ref 0.3–1.2)

## 2018-06-12 LAB — BASIC METABOLIC PANEL
Anion gap: 8 (ref 5–15)
BUN: 22 mg/dL — ABNORMAL HIGH (ref 6–20)
CALCIUM: 7.5 mg/dL — AB (ref 8.9–10.3)
CO2: 28 mmol/L (ref 22–32)
CREATININE: 1.08 mg/dL (ref 0.61–1.24)
Chloride: 90 mmol/L — ABNORMAL LOW (ref 98–111)
GFR calc Af Amer: >60 mL/min (ref >60)
Glucose, Bld: 116 mg/dL — ABNORMAL HIGH (ref 70–99)
Potassium: 3.6 mmol/L (ref 3.5–5.1)
SODIUM: 126 mmol/L — AB (ref 135–145)

## 2018-06-12 LAB — CBC
HCT: 26.4 % — ABNORMAL LOW (ref 39.0–52.0)
Hemoglobin: 8.9 g/dL — ABNORMAL LOW (ref 13.0–17.0)
MCH: 29.5 pg (ref 26.0–34.0)
MCHC: 33.7 g/dL (ref 30.0–36.0)
MCV: 87.4 fL (ref 78.0–100.0)
PLATELETS: 194 10*3/uL (ref 150–400)
RBC: 3.02 MIL/uL — ABNORMAL LOW (ref 4.22–5.81)
RDW: 14.1 % (ref 11.5–15.5)
WBC: 17.5 10*3/uL — ABNORMAL HIGH (ref 4.0–10.5)

## 2018-06-12 LAB — LACTATE DEHYDROGENASE: LDH: 187 U/L (ref 98–192)

## 2018-06-12 MED ORDER — SENNA 8.6 MG PO TABS
2.0000 | ORAL_TABLET | Freq: Every day | ORAL | Status: DC
  Administered 2018-06-12 – 2018-06-20 (×6): 17.2 mg via ORAL
  Filled 2018-06-12 (×7): qty 2

## 2018-06-12 MED ORDER — BUPRENORPHINE HCL-NALOXONE HCL 8-2 MG SL SUBL
1.0000 | SUBLINGUAL_TABLET | Freq: Every day | SUBLINGUAL | Status: DC
  Administered 2018-06-12 – 2018-06-21 (×9): 1 via SUBLINGUAL
  Filled 2018-06-12 (×10): qty 1

## 2018-06-12 MED ORDER — BISACODYL 10 MG RE SUPP: 10.0000 mg | Freq: Every day | RECTAL | Status: DC | PRN

## 2018-06-12 MED ORDER — IPRATROPIUM-ALBUTEROL 0.5-2.5 (3) MG/3ML IN SOLN: 3.0000 mL | RESPIRATORY_TRACT | Status: DC | PRN

## 2018-06-12 MED ORDER — POLYETHYLENE GLYCOL 3350 17 G PO PACK
17.0000 g | PACK | Freq: Every day | ORAL | Status: DC
  Administered 2018-06-12 – 2018-06-20 (×5): 17 g via ORAL
  Filled 2018-06-12 (×6): qty 1

## 2018-06-12 NOTE — Anesthesia Preprocedure Evaluation (Addendum)
Anesthesia Evaluation    Reviewed: Allergy & Precautions, NPO status , Patient's Chart, lab work & pertinent test results  History of Anesthesia Complications Negative for: history of anesthetic complications  Airway Mallampati: II  TM Distance: >3 FB Neck ROM: Full    Dental  (+) Poor Dentition   Pulmonary pneumonia, unresolved, Current Smoker,    Pulmonary exam normal        Cardiovascular Normal cardiovascular exam+ Valvular Problems/Murmurs   TTE 06/08/18: EF 55-60%, mild-mod TR, likely tricuspid endocarditis   Neuro/Psych PSYCHIATRIC DISORDERS Bipolar Disorder negative neurological ROS     GI/Hepatic negative GI ROS, (+)     substance abuse  IV drug use, Hepatitis -, C  Endo/Other  negative endocrine ROS  Renal/GU negative Renal ROS  negative genitourinary   Musculoskeletal negative musculoskeletal ROS (+)   Abdominal Normal abdominal exam  (+)   Peds  Hematology  (+) anemia ,   Anesthesia Other Findings   Reproductive/Obstetrics negative OB ROS                            Anesthesia Physical Anesthesia Plan  ASA: III  Anesthesia Plan: MAC   Post-op Pain Management:    Induction:   PONV Risk Score and Plan: 0 and Treatment may vary due to age or medical condition  Airway Management Planned: Natural Airway and Nasal Cannula  Additional Equipment:   Intra-op Plan:   Post-operative Plan:   Informed Consent: I have reviewed the patients History and Physical, chart, labs and discussed the procedure including the risks, benefits and alternatives for the proposed anesthesia with the patient or authorized representative who has indicated his/her understanding and acceptance.   Dental advisory given  Plan Discussed with: CRNA and Surgeon  Anesthesia Plan Comments:        Anesthesia Quick Evaluation

## 2018-06-12 NOTE — Progress Notes (Signed)
PROGRESS NOTE  Rodolphe Edmonston  ZOX:[id number] DOB: 1981/06/23 DOA: 06/08/2018 PCP: Patient, No Pcp Per   Brief Narrative: Leanthony Rhett is a 37 y.o. male with a history of tobacco use, IV heroin use, untreated bipolar disorder, and chronic hepatitis C who presented 10/2 with several days of fever, body aches, abdominal discomfort, pleuritic chest pain and mild cough. In the ED he was febrile to 103F with petechial rash to the lower extremities and hypoxia requiring 3-4L O2. CT abdomen noted no significant intraabdominal pathology, but demonstrated bilateral cavitary pulmonary nodules with CXR also showing multifocal pneumonia. 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 TEE recommended to confirm which is scheduled for 10/7. ID consulted and recommended 6 weeks IV ancef.  Assessment & Plan: Principal Problem:   Substance induced mood disorder (HCC) Active Problems:   Acute on chronic respiratory failure with hypoxia (HCC)   Polysubstance abuse (HCC)   Alcohol dependence with unspecified alcohol-induced disorder (HCC)   Abdominal pain   Hepatitis C, chronic (HCC)   Endocarditis of tricuspid valve   Septic embolism (HCC)   Multifocal pneumonia   Tobacco abuse  Sepsis due to MSSA bacteremia and tricuspid endocarditis:  - ID consulted, recommending 6 weeks of IV ancef - Per echocardiogram read, recommending TEE for confirmation, scheduled 10/7 at [facility] - Repeated blood cultures 10/4, NGTD, monitor for clearance. - Stopped IV fluids due to edema as below. Modest improvement in leukocytosis.   Acute RV failure, acute cor pulmonale secondary to septic pulmonary emboli: - Currently stable now that IV fluids have been stopped, edema continues to improve. - Daily weights, strict I/O  Acute hypoxic respiratory failure due to multifocal MSSA pneumonia from septic emboli: Sputum culture confirming S. aureus.  -  Continue supplemental oxygen and treating infection as above.  Anemia: Progressive, new. Pt having hemoptysis but not large volume and respiratory status is stabilizing so doubt worsening pulmonary hemorrhage. Thrombocytopenia is improving.  - Check LDH, haptoglobin, fractionate bili to r/o hemolysis - FOBT to r/o GI bleeding - Trend CBC, if continues to decline may need repeat abdominal imaging with ongoing abdominal pain.   IV heroin use, opioid use disorder:  - Last heroin use was morning of 10/2. Started on suboxone, and prn tramadol prn. COWS scores now down, will switch to daily suboxone and taper to off if continues to do well. - Consider outpatient follow up pending clinical course.  - Not a candidate for outpatient IV therapy. Discussed with patient daily.   Alcohol use, polysubstance abuse:  - Monitor closely    Hyponatremia: Improving slowly with isotonic saline consistent with dehydration.  - Monitor daily, stopped IVF  Abdominal pain, constipation: Unclear cause with negative CT and benign labs. Would not expect systemic emboli with only tricuspid involvement. ?radiation of pleuritic pain. - Monitor clinically - Bowel regimen (senna, miralax)  Bipolar disorder:  - Psychiatry consulted, recommending restart risperidone 1mg  qHS which is ordered. QTc improved to on last ECG 10/3.  - Needs to follow up with [facility] as outpatient per psychiatry.  Tobacco use:  - Declined nicotine patch.  - Cessation counseling provided for this and other substances.   Left hand/joint pain: Negative XR and reassuring exam. Possibly related to opioid withdrawal. Also ?if left heart involvement and microemboli though exam shows no nodules, splinter hemorrhages, etc.  - Monitor  DVT prophylaxis: Lovenox Code Status: Full Family Communication: None at bedside Disposition Plan: Will need 6 weeks of IV antibiotics.  TEE 10/7. Not stable for deescalation of care acuity.   Consultants:    Infectious disease  Procedures:   Echocardiogram 06/08/2018:  - Left ventricle: The cavity size was normal. Wall thickness was   normal. Systolic function was normal. The estimated ejection   fraction was in the range of 55% to 60%. Wall motion was normal;   there were no regional wall motion abnormalities. Left   ventricular diastolic function parameters were normal. - Ventricular septum: Very mildly D-shaped interventricular septum   is concerning for RV pressure/volume overload. - Aortic valve: There was no stenosis. - Mitral valve: There was no regurgitation. - Right ventricle: The cavity size was mildly dilated. Systolic   function was normal. - Right atrium: The atrium was mildly dilated. - Tricuspid valve: There was mild-moderate regurgitation. Suspected   tricuspid vegetation. Peak RV-RA gradient (S): 25 mm Hg. - Pulmonary arteries: PA peak pressure: 33 mm Hg (S). - Systemic veins: IVC measured 2.3 cm with > 50% respirophasic   variation, suggesting RA pressure 8 mmHg. - Pericardium, extracardiac: Small circumferential pericardial   effusion.  Impressions: - Normal LV size and systolic function, EF 55-60%. Normal diastolic   function. Mildly D-shaped interventricular septum suggesting a   degree of RV pressure/volume overload. Mildly dilated RV with   normal systolic function. Mild to moderate TR. There does appear   to be a tricuspid valve vegetation. Need TEE to confirm.  Antimicrobials:  Vancomycin, cefepime, flagyl 10/2 - 10/3  Ancef 10/3 >>   Subjective: Started having red streaks in sputum, no large clots/continuous hemoptysis. Shortness of breath is stable if not improving. Chest and upper abdominal pain is stable. Swelling decreasing, though he isn't getting up out of bed. Febrile to 101.63F today.    Objective: Vitals:   06/12/18 0946 06/12/18 1000 06/12/18 1100 06/12/18 1110  BP: (!) 104/46 (!) 95/44 (!) 101/50   Pulse: (!) 102 98 89   Resp: (!) 23  (!) 25 (!) 29   Temp:    98.6 F (37 C)  TempSrc:    Oral  SpO2: 91% 94% 94%   Weight:      Height:        Intake/Output Summary (Last 24 hours) at 06/12/2018 1327 Last data filed at 06/12/2018 0740 Gross per 24 hour  Intake 1110.54 ml  Output 800 ml  Net 310.54 ml   Filed Weights   06/10/18 0500 06/11/18 0500 06/12/18 0352  Weight: 80 kg 77 kg 79.3 kg   Gen: Ill-appearing male in no distress Pulm: Tachypnea with supplemental oxygen. Clear. CV: Regular tachycardia. Soft SEM, no rub, or gallop. No JVD, trace dependent edema. GI: Abdomen soft, non-tender, non-distended, with normoactive bowel sounds.  Ext: Warm, no deformities Skin: Unroofed blister on sole of forefoot without purulence or surrounding erythema. Neuro: Alert and oriented. No focal neurological deficits. Psych: Judgement and insight appear impaired.  Data Reviewed: I have personally reviewed following labs and imaging studies  CBC: Recent Labs  Lab 06/08/18 0742 06/09/18 0315 06/10/18 0314 06/11/18 0326 06/12/18 0343  WBC 21.2* 19.1* 19.4* 18.5* 17.5*  NEUTROABS 18.8  --   --   --   --   HGB 12.4* 10.2* 9.7* 9.6* 8.9*  HCT 35.9* 29.7* 29.1* 28.4* 26.4*  MCV 84.9 85.6 86.4 86.6 87.4  PLT 103* 104* 108* 157 194   Basic Metabolic Panel: Recent Labs  Lab 06/08/18 0742 06/09/18 0315 06/10/18 0314 06/11/18 0326 06/12/18 0343  NA 126* 128* 129* 128* 126*  K 3.8 3.9 4.2 3.7 3.6  CL 82* 92* 94* 92* 90*  CO2 29 27 27 27 28   GLUCOSE 96 112* 115* 88 116*  BUN 22* 18 22* 21* 22*  CREATININE 0.91 0.69 0.83 1.01 1.08  CALCIUM 8.3* 7.2* 7.1* 7.2* 7.5*   GFR: Estimated Creatinine Clearance: 97.8 mL/min (by C-G formula based on SCr of 1.08 mg/dL). Liver Function Tests: Recent Labs  Lab 06/08/18 0742 06/10/18 0314 06/12/18 0951  AST 191* 72*  --   ALT 87* 36  --   ALKPHOS 122 105  --   BILITOT 3.9* 1.3* 1.6*  PROT 6.1* 4.6*  --   ALBUMIN 2.2* 1.4*  --    Recent Labs  Lab 06/08/18 0742  LIPASE  29   No results for input(s): AMMONIA in the last 168 hours. Coagulation Profile: No results for input(s): INR, PROTIME in the last 168 hours. Cardiac Enzymes: No results for input(s): CKTOTAL, CKMB, CKMBINDEX, TROPONINI in the last 168 hours. BNP (last 3 results) No results for input(s): PROBNP in the last 8760 hours. HbA1C: No results for input(s): HGBA1C in the last 72 hours. CBG: No results for input(s): GLUCAP in the last 168 hours. Lipid Profile: No results for input(s): CHOL, HDL, LDLCALC, TRIG, CHOLHDL, LDLDIRECT in the last 72 hours. Thyroid Function Tests: No results for input(s): TSH, T4TOTAL, FREET4, T3FREE, THYROIDAB in the last 72 hours. Anemia Panel: No results for input(s): VITAMINB12, FOLATE, FERRITIN, TIBC, IRON, RETICCTPCT in the last 72 hours. Urine analysis:    Component Value Date/Time   COLORURINE AMBER (A) 06/08/2018 0811   APPEARANCEUR HAZY (A) 06/08/2018 0811   LABSPEC 1.017 06/08/2018 0811   PHURINE 5.0 06/08/2018 0811   GLUCOSEU NEGATIVE 06/08/2018 0811   HGBUR MODERATE (A) 06/08/2018 0811   BILIRUBINUR NEGATIVE 06/08/2018 0811   KETONESUR NEGATIVE 06/08/2018 0811   PROTEINUR 30 (A) 06/08/2018 0811   UROBILINOGEN 2.0 (H) 12/17/2013 0848   NITRITE NEGATIVE 06/08/2018 0811   LEUKOCYTESUR NEGATIVE 06/08/2018 0811   Recent Results (from the past 240 hour(s))  Blood Culture (routine x 2)     Status: Abnormal   Collection Time: 06/08/18  7:47 AM  Result Value Ref Range Status   Specimen Description   Final    BLOOD RIGHT ARM Performed at [facility], [address] [place], [place] [address]    Special Requests   Final    BOTTLES DRAWN AEROBIC AND ANAEROBIC Blood Culture results may not be optimal due to an excessive volume of blood received in culture bottles Performed at [facility], [address], [place], [place] [address]    Culture  Setup Time   Final    GRAM POSITIVE COCCI IN BOTH AEROBIC AND  ANAEROBIC BOTTLES CRITICAL RESULT CALLED TO, READ BACK BY AND VERIFIED WITH: PHARMD N GLOGOVAC [phone] MLM    Culture (A)  Final    STAPHYLOCOCCUS AUREUS BACILLUS SPECIES Standardized susceptibility testing for this organism is not available. CRITICAL RESULT CALLED TO, READ BACK BY AND VERIFIED WITH: PHARMD MEREDITH R [id number] FCP Performed at [facility], [address], [place], [place] [address]    Report Status 06/11/2018 FINAL  Final   Organism ID, Bacteria STAPHYLOCOCCUS AUREUS  Final      Susceptibility   Staphylococcus aureus - MIC*    CIPROFLOXACIN <=0.5 SENSITIVE Sensitive     ERYTHROMYCIN >=8 RESISTANT Resistant     GENTAMICIN <=0.5 SENSITIVE Sensitive     OXACILLIN 0.5  SENSITIVE Sensitive     TETRACYCLINE <=1 SENSITIVE Sensitive     VANCOMYCIN 1 SENSITIVE Sensitive     TRIMETH/SULFA <=10 SENSITIVE Sensitive     CLINDAMYCIN <=0.25 SENSITIVE Sensitive     RIFAMPIN <=0.5 SENSITIVE Sensitive     Inducible Clindamycin NEGATIVE Sensitive     * STAPHYLOCOCCUS AUREUS  Blood Culture ID Panel (Reflexed)     Status: Abnormal   Collection Time: 06/08/18  7:47 AM  Result Value Ref Range Status   Enterococcus species NOT DETECTED NOT DETECTED Final   Listeria monocytogenes NOT DETECTED NOT DETECTED Final   Staphylococcus species DETECTED (A) NOT DETECTED Final    Comment: CRITICAL RESULT CALLED TO, READ BACK BY AND VERIFIED WITH: PHARMD N GLOGOVAC [phone] MLM    Staphylococcus aureus (BCID) DETECTED (A) NOT DETECTED Final    Comment: Methicillin (oxacillin) susceptible Staphylococcus aureus (MSSA). Preferred therapy is anti staphylococcal beta lactam antibiotic (Cefazolin or Nafcillin), unless clinically contraindicated. CRITICAL RESULT CALLED TO, READ BACK BY AND VERIFIED WITH: PHARMD N GLOGOVAC [phone] MLM    Methicillin resistance NOT DETECTED NOT DETECTED Final   Streptococcus species NOT DETECTED NOT DETECTED Final   Streptococcus agalactiae NOT  DETECTED NOT DETECTED Final   Streptococcus pneumoniae NOT DETECTED NOT DETECTED Final   Streptococcus pyogenes NOT DETECTED NOT DETECTED Final   Acinetobacter baumannii NOT DETECTED NOT DETECTED Final   Enterobacteriaceae species NOT DETECTED NOT DETECTED Final   Enterobacter cloacae complex NOT DETECTED NOT DETECTED Final   Escherichia coli NOT DETECTED NOT DETECTED Final   Klebsiella oxytoca NOT DETECTED NOT DETECTED Final   Klebsiella pneumoniae NOT DETECTED NOT DETECTED Final   Proteus species NOT DETECTED NOT DETECTED Final   Serratia marcescens NOT DETECTED NOT DETECTED Final   Haemophilus influenzae NOT DETECTED NOT DETECTED Final   Neisseria meningitidis NOT DETECTED NOT DETECTED Final   Pseudomonas aeruginosa NOT DETECTED NOT DETECTED Final   Candida albicans NOT DETECTED NOT DETECTED Final   Candida glabrata NOT DETECTED NOT DETECTED Final   Candida krusei NOT DETECTED NOT DETECTED Final   Candida parapsilosis NOT DETECTED NOT DETECTED Final   Candida tropicalis NOT DETECTED NOT DETECTED Final    Comment: Performed at [facility], [address], [place], [place] [address]  Blood Culture (routine x 2)     Status: Abnormal   Collection Time: 06/08/18  7:55 AM  Result Value Ref Range Status   Specimen Description   Final    BLOOD RIGHT WRIST Performed at [facility], [address], [place], [place] [address]    Special Requests   Final    BOTTLES DRAWN AEROBIC AND ANAEROBIC Blood Culture results may not be optimal due to an excessive volume of blood received in culture bottles Performed at [facility], [address], [place], [place] [address]    Culture  Setup Time   Final    GRAM POSITIVE COCCI IN BOTH AEROBIC AND ANAEROBIC BOTTLES CRITICAL VALUE NOTED.  VALUE IS CONSISTENT WITH PREVIOUSLY REPORTED AND CALLED VALUE.    Culture (A)  Final    STAPHYLOCOCCUS AUREUS SUSCEPTIBILITIES PERFORMED ON PREVIOUS CULTURE WITHIN THE  LAST 5 DAYS. Performed at [facility], [address], [place], [place] [address]    Report Status 06/11/2018 FINAL  Final  Culture, sputum-assessment     Status: None   Collection Time: 06/08/18  3:06 PM  Result Value Ref Range Status   Specimen Description EXPECTORATED SPUTUM  Final  Special Requests NONE  Final   Sputum evaluation   Final    THIS SPECIMEN IS ACCEPTABLE FOR SPUTUM CULTURE Performed at [facility], [address] [place] [address], [place], [place] [address]    Report Status 06/08/2018 FINAL  Final  Culture, respiratory     Status: None   Collection Time: 06/08/18  3:06 PM  Result Value Ref Range Status   Specimen Description   Final    EXPECTORATED SPUTUM Performed at [facility], [address] [place], [place] [address]    Special Requests   Final    NONE Reflexed from [id number] Performed at [facility], [address] [place], [place] [address]    Gram Stain   Final    ABUNDANT WBC PRESENT,BOTH PMN AND MONONUCLEAR FEW GRAM POSITIVE COCCI FEW GRAM NEGATIVE RODS FEW SQUAMOUS EPITHELIAL CELLS PRESENT Performed at [facility], [address] [place], [place] [address]    Culture FEW STAPHYLOCOCCUS AUREUS  Final   Report Status 06/11/2018 FINAL  Final   Organism ID, Bacteria STAPHYLOCOCCUS AUREUS  Final      Susceptibility   Staphylococcus aureus - MIC*    CIPROFLOXACIN <=0.5 SENSITIVE Sensitive     ERYTHROMYCIN >=8 RESISTANT Resistant     GENTAMICIN <=0.5 SENSITIVE Sensitive     OXACILLIN 0.5 SENSITIVE Sensitive     TETRACYCLINE <=1 SENSITIVE Sensitive     VANCOMYCIN 1 SENSITIVE Sensitive     TRIMETH/SULFA <=10 SENSITIVE Sensitive     CLINDAMYCIN <=0.25 SENSITIVE Sensitive     RIFAMPIN <=0.5 SENSITIVE Sensitive     Inducible Clindamycin NEGATIVE Sensitive     * FEW STAPHYLOCOCCUS AUREUS  MRSA PCR Screening     Status: None   Collection Time: 06/08/18  3:22 PM  Result Value Ref Range Status    MRSA by PCR NEGATIVE NEGATIVE Final    Comment:        The GeneXpert MRSA Assay (FDA approved for NASAL specimens only), is one component of a comprehensive MRSA colonization surveillance program. It is not intended to diagnose MRSA infection nor to guide or monitor treatment for MRSA infections. Performed at [facility], [address] [place], [place] [address]   Culture, blood (routine x 2)     Status: None (Preliminary result)   Collection Time: 06/10/18  3:14 AM  Result Value Ref Range Status   Specimen Description   Final    BLOOD LEFT ARM Performed at [facility], [address] [place], [place] [address]    Special Requests   Final    BOTTLES DRAWN AEROBIC ONLY Blood Culture adequate volume Performed at [facility], [address], [place], [place] [address]    Culture   Final    NO GROWTH 1 DAY Performed at [facility], [address] [place], [place] [address]    Report Status PENDING  Incomplete  Culture, blood (routine x 2)     Status: None (Preliminary result)   Collection Time: 06/10/18  3:14 AM  Result Value Ref Range Status   Specimen Description   Final    BLOOD LEFT HAND Performed at [facility], [address] [place], [place] [address]    Special Requests   Final    BOTTLES DRAWN AEROBIC ONLY Blood Culture results may not be optimal due to an inadequate volume of blood received in culture bottles Performed at [facility], [address] [place], [place] [address]    Culture   Final    NO GROWTH 1 DAY Performed at [facility], [address] [place], [place] [address]    Report Status PENDING  Incomplete      Radiology Studies: No results found.  Scheduled Meds: . buprenorphine-naloxone  1 tablet Sublingual Daily  . enoxaparin (LOVENOX) injection  40 mg Subcutaneous Q24H  . mouth rinse  15 mL Mouth Rinse BID  . risperiDONE  1  mg Oral QHS   Continuous Infusions: . sodium chloride 10 mL/hr at 06/11/18 1800  .  ceFAZolin (ANCEF) IV Stopped (06/12/18 0606)     LOS: 4 days   Time spent: 35 minutes.  Tyrone Nine, MD [facility] [email or web address] Password [email or web address] 06/12/2018, 1:27 PM

## 2018-06-12 NOTE — H&P (View-Only) (Signed)
PROGRESS NOTE  Oscar Burke  MRN:[id number] DOB: 09/09/1980 DOA: 06/08/2018 PCP: Patient, No Pcp Per   Brief Narrative: Oscar Burke is a 37 y.o. male with a history of tobacco use, IV heroin use, untreated bipolar disorder, and chronic hepatitis C who presented 10/2 with several days of fever, body aches, abdominal discomfort, pleuritic chest pain and mild cough. In the ED he was febrile to 103F with petechial rash to the lower extremities and hypoxia requiring 3-4L O2. CT abdomen noted no significant intraabdominal pathology, but demonstrated bilateral cavitary pulmonary nodules with CXR also showing multifocal pneumonia. 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 TEE recommended to confirm which is scheduled for 10/7. ID consulted and recommended 6 weeks IV ancef.  Assessment & Plan: Principal Problem:   Substance induced mood disorder (HCC) Active Problems:   Acute on chronic respiratory failure with hypoxia (HCC)   Polysubstance abuse (HCC)   Alcohol dependence with unspecified alcohol-induced disorder (HCC)   Abdominal pain   Hepatitis C, chronic (HCC)   Endocarditis of tricuspid valve   Septic embolism (HCC)   Multifocal pneumonia   Tobacco abuse  Sepsis due to MSSA bacteremia and tricuspid endocarditis:  - ID consulted, recommending 6 weeks of IV ancef - Per echocardiogram read, recommending TEE for confirmation, scheduled 10/7 at [facility]. - Repeated blood cultures 10/4, NGTD, monitor for clearance. - Stopped IV fluids due to edema as below. Modest improvement in leukocytosis.   Acute RV failure, acute cor pulmonale secondary to septic pulmonary emboli: - Currently stable now that IV fluids have been stopped, edema continues to improve. - Daily weights, strict I/O  Acute hypoxic respiratory failure due to multifocal MSSA pneumonia from septic emboli: Sputum culture confirming S. aureus.  -  Continue supplemental oxygen and treating infection as above.  Anemia: Progressive, new. Pt having hemoptysis but not large volume and respiratory status is stabilizing so doubt worsening pulmonary hemorrhage. Thrombocytopenia is improving.  - Check LDH, haptoglobin, fractionate bili to r/o hemolysis - FOBT to r/o GI bleeding - Trend CBC, if continues to decline may need repeat abdominal imaging with ongoing abdominal pain.   IV heroin use, opioid use disorder:  - Last heroin use was morning of 10/2. Started on suboxone, and prn tramadol prn. COWS scores now down, will switch to daily suboxone and taper to off if continues to do well. - Consider outpatient follow up pending clinical course.  - Not a candidate for outpatient IV therapy. Discussed with patient daily.   Alcohol use, polysubstance abuse:  - Monitor closely    Hyponatremia: Improving slowly with isotonic saline consistent with dehydration.  - Monitor daily, stopped IVF  Abdominal pain, constipation: Unclear cause with negative CT and benign labs. Would not expect systemic emboli with only tricuspid involvement. ?radiation of pleuritic pain. - Monitor clinically - Bowel regimen (senna, miralax)  Bipolar disorder:  - Psychiatry consulted, recommending restart risperidone 1mg qHS which is ordered. QTc improved to 444msec on last ECG 10/3.  - Needs to follow up with [facility] as outpatient per psychiatry.  Tobacco use:  - Declined nicotine patch.  - Cessation counseling provided for this and other substances.   Left hand/joint pain: Negative XR and reassuring exam. Possibly related to opioid withdrawal. Also ?if left heart involvement and microemboli though exam shows no nodules, splinter hemorrhages, etc.  - Monitor  DVT prophylaxis: Lovenox Code Status: Full Family Communication: None at bedside Disposition Plan: Will need 6 weeks of IV antibiotics.   TEE 10/7. Not stable for deescalation of care acuity.   Consultants:    Infectious disease  Procedures:   Echocardiogram 06/08/2018:  - Left ventricle: The cavity size was normal. Wall thickness was   normal. Systolic function was normal. The estimated ejection   fraction was in the range of 55% to 60%. Wall motion was normal;   there were no regional wall motion abnormalities. Left   ventricular diastolic function parameters were normal. - Ventricular septum: Very mildly D-shaped interventricular septum   is concerning for RV pressure/volume overload. - Aortic valve: There was no stenosis. - Mitral valve: There was no regurgitation. - Right ventricle: The cavity size was mildly dilated. Systolic   function was normal. - Right atrium: The atrium was mildly dilated. - Tricuspid valve: There was mild-moderate regurgitation. Suspected   tricuspid vegetation. Peak RV-RA gradient (S): 25 mm Hg. - Pulmonary arteries: PA peak pressure: 33 mm Hg (S). - Systemic veins: IVC measured 2.3 cm with > 50% respirophasic   variation, suggesting RA pressure 8 mmHg. - Pericardium, extracardiac: Small circumferential pericardial   effusion.  Impressions: - Normal LV size and systolic function, EF 55-60%. Normal diastolic   function. Mildly D-shaped interventricular septum suggesting a   degree of RV pressure/volume overload. Mildly dilated RV with   normal systolic function. Mild to moderate TR. There does appear   to be a tricuspid valve vegetation. Need TEE to confirm.  Antimicrobials:  Vancomycin, cefepime, flagyl 10/2 - 10/3  Ancef 10/3 >>   Subjective: Started having red streaks in sputum, no large clots/continuous hemoptysis. Shortness of breath is stable if not improving. Chest and upper abdominal pain is stable. Swelling decreasing, though he isn't getting up out of bed. Febrile to 101.6F today.    Objective: Vitals:   06/12/18 0946 06/12/18 1000 06/12/18 1100 06/12/18 1110  BP: (!) 104/46 (!) 95/44 (!) 101/50   Pulse: (!) 102 98 89   Resp: (!) 23  (!) 25 (!) 29   Temp:    98.6 F (37 C)  TempSrc:    Oral  SpO2: 91% 94% 94%   Weight:      Height:        Intake/Output Summary (Last 24 hours) at 06/12/2018 1327 Last data filed at 06/12/2018 0740 Gross per 24 hour  Intake 1110.54 ml  Output 800 ml  Net 310.54 ml   Filed Weights   06/10/18 0500 06/11/18 0500 06/12/18 0352  Weight: 80 kg 77 kg 79.3 kg   Gen: Ill-appearing male in no distress Pulm: Tachypnea with supplemental oxygen. Clear. CV: Regular tachycardia. Soft SEM, no rub, or gallop. No JVD, trace dependent edema. GI: Abdomen soft, non-tender, non-distended, with normoactive bowel sounds.  Ext: Warm, no deformities Skin: Unroofed blister on sole of forefoot without purulence or surrounding erythema. Neuro: Alert and oriented. No focal neurological deficits. Psych: Judgement and insight appear impaired.  Data Reviewed: I have personally reviewed following labs and imaging studies  CBC: Recent Labs  Lab 06/08/18 0742 06/09/18 0315 06/10/18 0314 06/11/18 0326 06/12/18 0343  WBC 21.2* 19.1* 19.4* 18.5* 17.5*  NEUTROABS 18.8  --   --   --   --   HGB 12.4* 10.2* 9.7* 9.6* 8.9*  HCT 35.9* 29.7* 29.1* 28.4* 26.4*  MCV 84.9 85.6 86.4 86.6 87.4  PLT 103* 104* 108* 157 194   Basic Metabolic Panel: Recent Labs  Lab 06/08/18 0742 06/09/18 0315 06/10/18 0314 06/11/18 0326 06/12/18 0343  NA 126* 128* 129* 128* 126*    K 3.8 3.9 4.2 3.7 3.6  CL 82* 92* 94* 92* 90*  CO2 29 27 27 27 28  GLUCOSE 96 112* 115* 88 116*  BUN 22* 18 22* 21* 22*  CREATININE 0.91 0.69 0.83 1.01 1.08  CALCIUM 8.3* 7.2* 7.1* 7.2* 7.5*   GFR: Estimated Creatinine Clearance: 97.8 mL/min (by C-G formula based on SCr of 1.08 mg/dL). Liver Function Tests: Recent Labs  Lab 06/08/18 0742 06/10/18 0314 06/12/18 0951  AST 191* 72*  --   ALT 87* 36  --   ALKPHOS 122 105  --   BILITOT 3.9* 1.3* 1.6*  PROT 6.1* 4.6*  --   ALBUMIN 2.2* 1.4*  --    Recent Labs  Lab 06/08/18 0742  LIPASE  29   No results for input(s): AMMONIA in the last 168 hours. Coagulation Profile: No results for input(s): INR, PROTIME in the last 168 hours. Cardiac Enzymes: No results for input(s): CKTOTAL, CKMB, CKMBINDEX, TROPONINI in the last 168 hours. BNP (last 3 results) No results for input(s): PROBNP in the last 8760 hours. HbA1C: No results for input(s): HGBA1C in the last 72 hours. CBG: No results for input(s): GLUCAP in the last 168 hours. Lipid Profile: No results for input(s): CHOL, HDL, LDLCALC, TRIG, CHOLHDL, LDLDIRECT in the last 72 hours. Thyroid Function Tests: No results for input(s): TSH, T4TOTAL, FREET4, T3FREE, THYROIDAB in the last 72 hours. Anemia Panel: No results for input(s): VITAMINB12, FOLATE, FERRITIN, TIBC, IRON, RETICCTPCT in the last 72 hours. Urine analysis:    Component Value Date/Time   COLORURINE AMBER (A) 06/08/2018 0811   APPEARANCEUR HAZY (A) 06/08/2018 0811   LABSPEC 1.017 06/08/2018 0811   PHURINE 5.0 06/08/2018 0811   GLUCOSEU NEGATIVE 06/08/2018 0811   HGBUR MODERATE (A) 06/08/2018 0811   BILIRUBINUR NEGATIVE 06/08/2018 0811   KETONESUR NEGATIVE 06/08/2018 0811   PROTEINUR 30 (A) 06/08/2018 0811   UROBILINOGEN 2.0 (H) 12/17/2013 0848   NITRITE NEGATIVE 06/08/2018 0811   LEUKOCYTESUR NEGATIVE 06/08/2018 0811   Recent Results (from the past 240 hour(s))  Blood Culture (routine x 2)     Status: Abnormal   Collection Time: 06/08/18  7:47 AM  Result Value Ref Range Status   Specimen Description   Final    BLOOD RIGHT ARM Performed at [facility], [address] [place], [place] [address]    Special Requests   Final    BOTTLES DRAWN AEROBIC AND ANAEROBIC Blood Culture results may not be optimal due to an excessive volume of blood received in culture bottles Performed at [facility], [address] [place], [place] [address]    Culture  Setup Time   Final    GRAM POSITIVE COCCI IN BOTH AEROBIC AND  ANAEROBIC BOTTLES CRITICAL RESULT CALLED TO, READ BACK BY AND VERIFIED WITH: PHARMD N GLOGOVAC [id number] 2052 MLM    Culture (A)  Final    STAPHYLOCOCCUS AUREUS BACILLUS SPECIES Standardized susceptibility testing for this organism is not available. CRITICAL RESULT CALLED TO, READ BACK BY AND VERIFIED WITH: PHARMD MEREDITH R [id number] FCP Performed at [facility], [address] [place], [place] [address]    Report Status 06/11/2018 FINAL  Final   Organism ID, Bacteria STAPHYLOCOCCUS AUREUS  Final      Susceptibility   Staphylococcus aureus - MIC*    CIPROFLOXACIN <=0.5 SENSITIVE Sensitive     ERYTHROMYCIN >=8 RESISTANT Resistant     GENTAMICIN <=0.5 SENSITIVE Sensitive     OXACILLIN 0.5   SENSITIVE Sensitive     TETRACYCLINE <=1 SENSITIVE Sensitive     VANCOMYCIN 1 SENSITIVE Sensitive     TRIMETH/SULFA <=10 SENSITIVE Sensitive     CLINDAMYCIN <=0.25 SENSITIVE Sensitive     RIFAMPIN <=0.5 SENSITIVE Sensitive     Inducible Clindamycin NEGATIVE Sensitive     * STAPHYLOCOCCUS AUREUS  Blood Culture ID Panel (Reflexed)     Status: Abnormal   Collection Time: 06/08/18  7:47 AM  Result Value Ref Range Status   Enterococcus species NOT DETECTED NOT DETECTED Final   Listeria monocytogenes NOT DETECTED NOT DETECTED Final   Staphylococcus species DETECTED (A) NOT DETECTED Final    Comment: CRITICAL RESULT CALLED TO, READ BACK BY AND VERIFIED WITH: PHARMD N GLOGOVAC [id number] 2052 MLM    Staphylococcus aureus (BCID) DETECTED (A) NOT DETECTED Final    Comment: Methicillin (oxacillin) susceptible Staphylococcus aureus (MSSA). Preferred therapy is anti staphylococcal beta lactam antibiotic (Cefazolin or Nafcillin), unless clinically contraindicated. CRITICAL RESULT CALLED TO, READ BACK BY AND VERIFIED WITH: PHARMD N GLOGOVAC [id number] 2052 MLM    Methicillin resistance NOT DETECTED NOT DETECTED Final   Streptococcus species NOT DETECTED NOT DETECTED Final   Streptococcus agalactiae NOT  DETECTED NOT DETECTED Final   Streptococcus pneumoniae NOT DETECTED NOT DETECTED Final   Streptococcus pyogenes NOT DETECTED NOT DETECTED Final   Acinetobacter baumannii NOT DETECTED NOT DETECTED Final   Enterobacteriaceae species NOT DETECTED NOT DETECTED Final   Enterobacter cloacae complex NOT DETECTED NOT DETECTED Final   Escherichia coli NOT DETECTED NOT DETECTED Final   Klebsiella oxytoca NOT DETECTED NOT DETECTED Final   Klebsiella pneumoniae NOT DETECTED NOT DETECTED Final   Proteus species NOT DETECTED NOT DETECTED Final   Serratia marcescens NOT DETECTED NOT DETECTED Final   Haemophilus influenzae NOT DETECTED NOT DETECTED Final   Neisseria meningitidis NOT DETECTED NOT DETECTED Final   Pseudomonas aeruginosa NOT DETECTED NOT DETECTED Final   Candida albicans NOT DETECTED NOT DETECTED Final   Candida glabrata NOT DETECTED NOT DETECTED Final   Candida krusei NOT DETECTED NOT DETECTED Final   Candida parapsilosis NOT DETECTED NOT DETECTED Final   Candida tropicalis NOT DETECTED NOT DETECTED Final    Comment: Performed at [facility], [address] [place], [place] [address]  Blood Culture (routine x 2)     Status: Abnormal   Collection Time: 06/08/18  7:55 AM  Result Value Ref Range Status   Specimen Description   Final    BLOOD RIGHT WRIST Performed at [facility], [address] [place], [place] [address]    Special Requests   Final    BOTTLES DRAWN AEROBIC AND ANAEROBIC Blood Culture results may not be optimal due to an excessive volume of blood received in culture bottles Performed at [facility], [address] [place], [place] [address]    Culture  Setup Time   Final    GRAM POSITIVE COCCI IN BOTH AEROBIC AND ANAEROBIC BOTTLES CRITICAL VALUE NOTED.  VALUE IS CONSISTENT WITH PREVIOUSLY REPORTED AND CALLED VALUE.    Culture (A)  Final    STAPHYLOCOCCUS AUREUS SUSCEPTIBILITIES PERFORMED ON PREVIOUS CULTURE WITHIN THE  LAST 5 DAYS. Performed at [facility], [address] [place], [place] [address]    Report Status 06/11/2018 FINAL  Final  Culture, sputum-assessment     Status: None   Collection Time: 06/08/18  3:06 PM  Result Value Ref Range Status   Specimen Description EXPECTORATED SPUTUM  Final     Special Requests NONE  Final   Sputum evaluation   Final    THIS SPECIMEN IS ACCEPTABLE FOR SPUTUM CULTURE Performed at [facility], [address] [place], [place] [address]    Report Status 06/08/2018 FINAL  Final  Culture, respiratory     Status: None   Collection Time: 06/08/18  3:06 PM  Result Value Ref Range Status   Specimen Description   Final    EXPECTORATED SPUTUM Performed at [facility], [address] [place], [place] [address]    Special Requests   Final    NONE Reflexed from [id number] Performed at [facility], [address] [place], [place] [address]    Gram Stain   Final    ABUNDANT WBC PRESENT,BOTH PMN AND MONONUCLEAR FEW GRAM POSITIVE COCCI FEW GRAM NEGATIVE RODS FEW SQUAMOUS EPITHELIAL CELLS PRESENT Performed at [facility], [address] [place], [place] [address]    Culture FEW STAPHYLOCOCCUS AUREUS  Final   Report Status 06/11/2018 FINAL  Final   Organism ID, Bacteria STAPHYLOCOCCUS AUREUS  Final      Susceptibility   Staphylococcus aureus - MIC*    CIPROFLOXACIN <=0.5 SENSITIVE Sensitive     ERYTHROMYCIN >=8 RESISTANT Resistant     GENTAMICIN <=0.5 SENSITIVE Sensitive     OXACILLIN 0.5 SENSITIVE Sensitive     TETRACYCLINE <=1 SENSITIVE Sensitive     VANCOMYCIN 1 SENSITIVE Sensitive     TRIMETH/SULFA <=10 SENSITIVE Sensitive     CLINDAMYCIN <=0.25 SENSITIVE Sensitive     RIFAMPIN <=0.5 SENSITIVE Sensitive     Inducible Clindamycin NEGATIVE Sensitive     * FEW STAPHYLOCOCCUS AUREUS  MRSA PCR Screening     Status: None   Collection Time: 06/08/18  3:22 PM  Result Value Ref Range Status    MRSA by PCR NEGATIVE NEGATIVE Final    Comment:        The GeneXpert MRSA Assay (FDA approved for NASAL specimens only), is one component of a comprehensive MRSA colonization surveillance program. It is not intended to diagnose MRSA infection nor to guide or monitor treatment for MRSA infections. Performed at [facility], [address] [place], [place] [address]   Culture, blood (routine x 2)     Status: None (Preliminary result)   Collection Time: 06/10/18  3:14 AM  Result Value Ref Range Status   Specimen Description   Final    BLOOD LEFT ARM Performed at [facility], [address] [place], [place] [address]    Special Requests   Final    BOTTLES DRAWN AEROBIC ONLY Blood Culture adequate volume Performed at [facility], [address] [place], [place] [address]    Culture   Final    NO GROWTH 1 DAY Performed at [facility], [address] [place], [place] [address]    Report Status PENDING  Incomplete  Culture, blood (routine x 2)     Status: None (Preliminary result)   Collection Time: 06/10/18  3:14 AM  Result Value Ref Range Status   Specimen Description   Final    BLOOD LEFT HAND Performed at [facility], [address] , [place] [address]    Special Requests   Final    BOTTLES DRAWN AEROBIC ONLY Blood Culture results may not be optimal due to an inadequate volume of blood received in culture bottles Performed at [facility], [address] [place], [place] [address]    Culture   Final    NO GROWTH 1 DAY Performed at [facility], [address] [place], [place] [address]    Report Status PENDING  Incomplete      Radiology Studies: No results found.  Scheduled Meds: . buprenorphine-naloxone  1 tablet Sublingual Daily  . enoxaparin (LOVENOX) injection  40 mg Subcutaneous Q24H  . mouth rinse  15 mL Mouth Rinse BID  . risperiDONE  1  mg Oral QHS   Continuous Infusions: . sodium chloride 10 mL/hr at 06/11/18 1800  .  ceFAZolin (ANCEF) IV Stopped (06/12/18 0606)     LOS: 4 days   Time spent: 35 minutes.  Donatella Walski B Marvin Maenza, MD [facility] [email or web address] Password [email or web address] 06/12/2018, 1:27 PM  

## 2018-06-13 ENCOUNTER — Inpatient Hospital Stay (HOSPITAL_COMMUNITY): Payer: Self-pay | Admitting: Anesthesiology

## 2018-06-13 ENCOUNTER — Encounter (HOSPITAL_COMMUNITY)
Admission: EM | Discharge status: Against Medical Advice | Payer: Self-pay | Source: Home / Self Care | Attending: Internal Medicine

## 2018-06-13 ENCOUNTER — Inpatient Hospital Stay (HOSPITAL_COMMUNITY): Payer: Self-pay

## 2018-06-13 ENCOUNTER — Encounter (HOSPITAL_COMMUNITY): Payer: Self-pay | Admitting: *Deleted

## 2018-06-13 DIAGNOSIS — R7881 Bacteremia: Secondary | ICD-10-CM

## 2018-06-13 DIAGNOSIS — R079 Chest pain, unspecified: Secondary | ICD-10-CM

## 2018-06-13 DIAGNOSIS — I330 Acute and subacute infective endocarditis: Secondary | ICD-10-CM

## 2018-06-13 DIAGNOSIS — I071 Rheumatic tricuspid insufficiency: Secondary | ICD-10-CM

## 2018-06-13 HISTORY — PX: TEE WITHOUT CARDIOVERSION: SHX5443

## 2018-06-13 LAB — BASIC METABOLIC PANEL
ANION GAP: 8 (ref 5–15)
ANION GAP: 9 (ref 5–15)
BUN: 20 mg/dL (ref 6–20)
BUN: 22 mg/dL — AB (ref 6–20)
CALCIUM: 7.3 mg/dL — AB (ref 8.9–10.3)
CALCIUM: 7.3 mg/dL — AB (ref 8.9–10.3)
CO2: 29 mmol/L (ref 22–32)
CO2: 28 mmol/L (ref 22–32)
Chloride: 90 mmol/L — ABNORMAL LOW (ref 98–111)
Chloride: 89 mmol/L — ABNORMAL LOW (ref 98–111)
Creatinine, Ser: 1.03 mg/dL (ref 0.61–1.24)
Creatinine, Ser: 1.2 mg/dL (ref 0.61–1.24)
GFR calc Af Amer: >60 mL/min (ref >60)
GFR calc Af Amer: >60 mL/min (ref >60)
GFR calc non Af Amer: >60 mL/min (ref >60)
GLUCOSE: 110 mg/dL — AB (ref 70–99)
GLUCOSE: 99 mg/dL (ref 70–99)
Potassium: 4.1 mmol/L (ref 3.5–5.1)
Potassium: 4.3 mmol/L (ref 3.5–5.1)
SODIUM: 127 mmol/L — AB (ref 135–145)
SODIUM: 126 mmol/L — AB (ref 135–145)

## 2018-06-13 LAB — CBC
HCT: 24.7 % — ABNORMAL LOW (ref 39.0–52.0)
Hemoglobin: 8.2 g/dL — ABNORMAL LOW (ref 13.0–17.0)
MCH: 29.1 pg (ref 26.0–34.0)
MCHC: 33.2 g/dL (ref 30.0–36.0)
MCV: 87.6 fL (ref 78.0–100.0)
PLATELETS: 258 10*3/uL (ref 150–400)
RBC: 2.82 MIL/uL — AB (ref 4.22–5.81)
RDW: 14 % (ref 11.5–15.5)
WBC: 18.9 10*3/uL — AB (ref 4.0–10.5)

## 2018-06-13 LAB — MRSA PCR SCREENING: MRSA by PCR: NEGATIVE

## 2018-06-13 SURGERY — ECHOCARDIOGRAM, TRANSESOPHAGEAL
Anesthesia: Monitor Anesthesia Care

## 2018-06-13 MED ORDER — CYCLOBENZAPRINE HCL 10 MG PO TABS
5.0000 mg | ORAL_TABLET | Freq: Three times a day (TID) | ORAL | Status: DC | PRN
  Administered 2018-06-13 – 2018-06-21 (×10): 5 mg via ORAL
  Filled 2018-06-13 (×10): qty 1

## 2018-06-13 MED ORDER — BUTAMBEN-TETRACAINE-BENZOCAINE 2-2-14 % EX AERO
INHALATION_SPRAY | CUTANEOUS | Status: DC | PRN
  Administered 2018-06-13: 2 via TOPICAL

## 2018-06-13 MED ORDER — PROPOFOL 500 MG/50ML IV EMUL
INTRAVENOUS | Status: DC | PRN
  Administered 2018-06-13: 100 ug/kg/min via INTRAVENOUS

## 2018-06-13 MED ORDER — SODIUM CHLORIDE 0.9 % IV SOLN
INTRAVENOUS | Status: DC
  Administered 2018-06-13 (×2): via INTRAVENOUS

## 2018-06-13 MED ORDER — LIDOCAINE HCL (CARDIAC) PF 100 MG/5ML IV SOSY
PREFILLED_SYRINGE | INTRAVENOUS | Status: DC | PRN
  Administered 2018-06-13: 30 mg via INTRAVENOUS

## 2018-06-13 MED ORDER — PROPOFOL 10 MG/ML IV BOLUS
INTRAVENOUS | Status: DC | PRN
  Administered 2018-06-13: 30 mg via INTRAVENOUS

## 2018-06-13 NOTE — Anesthesia Postprocedure Evaluation (Signed)
Anesthesia Post Note  Patient: Oscar Burke  Procedure(s) Performed: TRANSESOPHAGEAL ECHOCARDIOGRAM (TEE) (N/A )     Patient location during evaluation: PACU Anesthesia Type: MAC Level of consciousness: awake and alert Pain management: pain level controlled Vital Signs Assessment: post-procedure vital signs reviewed and stable Respiratory status: spontaneous breathing, nonlabored ventilation, respiratory function stable and patient connected to nasal cannula oxygen Cardiovascular status: blood pressure returned to baseline and stable Postop Assessment: no apparent nausea or vomiting Anesthetic complications: no    Last Vitals:  Vitals:   06/13/18 1201 06/13/18 1212  BP: 102/74 (!) 100/59  Pulse:  91  Resp:  19  Temp:  37.6 C  SpO2:  93%    Last Pain:  Vitals:   06/13/18 1212  TempSrc: Oral  PainSc: 0-No pain                 Brennan Bailey

## 2018-06-13 NOTE — Progress Notes (Signed)
Report given to Porfirio Oar, Charity fundraiser at [facility] 6E Progressive unit

## 2018-06-13 NOTE — CV Procedure (Signed)
TEE: Anesthesia: Propofol 3D rendering of TV  EF 60% Normal PV/AV/MV Large vegetation on the lateral leaflet of the TV with severe TR measures 2.0 cm longest dimension No LAA thrombus No ASD/PFO No pericardial effusion Normal RV  No aortic debris  [name]

## 2018-06-13 NOTE — Progress Notes (Signed)
  Echocardiogram 2D Echocardiogram has been performed.  Oscar Burke 06/13/2018, 1:05 PM

## 2018-06-13 NOTE — Progress Notes (Signed)
Patient scheduled for TEE @ [facility] @ 1130. Patient needs to arrive to [facility] by 1000 for procedure via [facility]. Spoke with Sharlynn Oliphant, RN and she will verify transportation.

## 2018-06-13 NOTE — Care Management Note (Signed)
Case Management Note  Patient Details  Name: Oscar Burke MRN: [id number] Date of Birth: [birth year]  Subjective/Objective:                  Having TEE at [facility] today cxr-pneumonia mulitfocal, hx of polysubstance and etoh abuse. Action/Plan: Will follow for progression of care and clinical status. Will follow for case management needs none present at this time.  Expected Discharge Date:  (unknown)               Expected Discharge Plan:  Home/Self Care  In-House Referral:     Discharge planning Services  CM Consult  Post Acute Care Choice:    Choice offered to:     DME Arranged:    DME Agency:     HH Arranged:    HH Agency:     Status of Service:  In process, will continue to follow  If discussed at Long Length of Stay Meetings, dates discussed:    Additional Comments:  Golda Acre, RN 06/13/2018, 9:15 AM

## 2018-06-13 NOTE — Progress Notes (Signed)
Orders written to do a COWS assessment Q4hrs:  1. Resting pulse rate 98 Score 1    2. Sweating  Score 3   3. Restlessness Score 0   4. Pupil Size Score 0   5. Bone or Muscle Aches Score 2   6. Runny nose or tearing Score 0   7. GI Upset Score 0   8. Tremors Score 2   9. Yawning Score 1   10 Anxiety or Irritability Score 1    11 Gooseflesh skin Score 0      Total Score 10 Mild withdrawal

## 2018-06-13 NOTE — Interval H&P Note (Signed)
History and Physical Interval Note:  06/13/2018 11:04 AM  Oscar Burke  has presented today for surgery, with the diagnosis of BACTEREMIA  The various methods of treatment have been discussed with the patient and family. After consideration of risks, benefits and other options for treatment, the patient has consented to  Procedure(s): TRANSESOPHAGEAL ECHOCARDIOGRAM (TEE) (N/A) as a surgical intervention .  The patient's history has been reviewed, patient examined, no change in status, stable for surgery.  I have reviewed the patient's chart and labs.  Questions were answered to the patient's satisfaction.     [name]

## 2018-06-13 NOTE — Progress Notes (Addendum)
PROGRESS NOTE  Oscar Burke  ZOX:[id number] DOB: February 21, 1981 DOA: 06/08/2018 PCP: Patient, No Pcp Per   Brief Narrative: Oscar Burke is a 37 y.o. male with a history of tobacco use, IV heroin use, untreated bipolar disorder, and chronic hepatitis C who presented 10/2 with several days of fever, body aches, abdominal discomfort, pleuritic chest pain and mild cough. In the ED he was febrile to 103F with petechial rash to the lower extremities and hypoxia requiring 3-4L O2. CT abdomen noted no significant intraabdominal pathology, but demonstrated bilateral cavitary pulmonary nodules with CXR also showing multifocal pneumonia. 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 ID consulted and recommended 6 weeks IV ancef. TEE subsequently performed 10/7 showing a large (2cm) vegetation on the lateral leaflet of the tricuspid valve with severe TR. Cardiothoracic surgery is consulted.  Assessment & Plan: Principal Problem:   Substance induced mood disorder (HCC) Active Problems:   Acute on chronic respiratory failure with hypoxia (HCC)   Polysubstance abuse (HCC)   Alcohol dependence with unspecified alcohol-induced disorder (HCC)   Abdominal pain   Hepatitis C, chronic (HCC)   Endocarditis of tricuspid valve   Septic embolism (HCC)   Multifocal pneumonia   Tobacco abuse  Sepsis due to MSSA bacteremia and tricuspid endocarditis:  - ID consulted, recommending 6 weeks of IV ancef. - Repeated blood cultures 10/4, NGTD, monitor for clearance.  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 This has been called, recommended transfer to Oscar Burke for TCTS evaluation. Will also formally consult cardiology. D/w Oscar Burke, sent inbox message as well.  Acute RV failure, acute cor pulmonale secondary to septic pulmonary emboli and  tricuspid regurgitation: This is stabilizing since IV fluids were pulled back.  - Daily weights, strict I/O - Cardiology consulted as above  Acute hypoxic respiratory failure due to multifocal MSSA pneumonia from septic emboli, TR: Sputum culture confirming S. aureus.  - Continue supplemental oxygen and treating infection as above.  Anemia: Progressive, new. Pt having hemoptysis but not large volume and respiratory status is stabilizing so doubt worsening pulmonary hemorrhage. Thrombocytopenia is improving.  - Check LDH, haptoglobin, fractionate bili to r/o hemolysis - FOBT to r/o GI bleeding - Trend CBC, if continues to decline may need repeat abdominal imaging with ongoing abdominal pain.   IV heroin use, opioid use disorder:  - Last heroin use was morning of 10/2. Started on suboxone, and prn tramadol prn. COWS scores now down, will switch to daily suboxone and taper to off if continues to do well. - Consider outpatient follow up pending clinical course.  - Not a candidate for outpatient IV therapy. Discussed with patient daily.   Alcohol use, polysubstance abuse:  - Monitor closely    Hyponatremia: Improving slowly with isotonic saline consistent with dehydration.  - Monitor daily, stopped IVF  Abdominal pain, constipation: Unclear cause with negative CT and benign labs. Would not expect systemic emboli with only tricuspid involvement. ?radiation of pleuritic pain. - Monitor clinically - Bowel regimen (senna, miralax)  Bipolar disorder:  - Psychiatry consulted, recommending restart risperidone 1mg  qHS which is ordered. QTc improved to on last ECG 10/3.  - Needs to follow up with Oscar Burke as outpatient per psychiatry.  Tobacco use:  - Declined nicotine patch.  - Cessation counseling provided for this and other substances.   Left hand/joint pain: Negative XR and reassuring exam. Possibly related to opioid withdrawal. Also ?if  left heart involvement and microemboli though  exam shows no nodules, splinter hemorrhages, etc.  - Monitor  DVT prophylaxis: Lovenox Code Status: Full Family Communication: None at bedside Disposition Plan: Transfer to Oscar Burke for CT surgery evaluation. Will need 6 weeks of IV antibiotics. Not stable for deescalation of care acuity.   Consultants:   Infectious disease  CT surgery, Oscar Burke  Cardiology pending for 10/08  Procedures:   Echocardiogram 06/08/2018:  - Left ventricle: The cavity size was normal. Wall thickness was   normal. Systolic function was normal. The estimated ejection   fraction was in the range of 55% to 60%. Wall motion was normal;   there were no regional wall motion abnormalities. Left   ventricular diastolic function parameters were normal. - Ventricular septum: Very mildly D-shaped interventricular septum   is concerning for RV pressure/volume overload. - Aortic valve: There was no stenosis. - Mitral valve: There was no regurgitation. - Right ventricle: The cavity size was mildly dilated. Systolic   function was normal. - Right atrium: The atrium was mildly dilated. - Tricuspid valve: There was mild-moderate regurgitation. Suspected   tricuspid vegetation. Peak RV-RA gradient (S): 25 mm Hg. - Pulmonary arteries: PA peak pressure: 33 mm Hg (S). - Systemic veins: IVC measured 2.3 cm with > 50% respirophasic   variation, suggesting RA pressure 8 mmHg. - Pericardium, extracardiac: Small circumferential pericardial   effusion.  Impressions: - Normal LV size and systolic function, EF 55-60%. Normal diastolic   function. Mildly D-shaped interventricular septum suggesting a   degree of RV pressure/volume overload. Mildly dilated RV with   normal systolic function. Mild to moderate TR. There does appear   to be a tricuspid valve vegetation. Need TEE to confirm.  TRANSESOPHAGEAL ECHOCARDIOGRAM 06/13/2018: EF 60% Normal PV/AV/MV Large vegetation on the lateral leaflet of the TV with severe TR measures  2.0 cm longest dimension No LAA thrombus No ASD/PFO No pericardial effusion Normal RV  No aortic debris  Oscar Burke  Antimicrobials:  Vancomycin, cefepime, flagyl 10/2 - 10/3  Ancef 10/3 >>   Subjective: Still with small volume hemoptysis, no other bleeding noted. Dyspnea stable, still pleuritic chest pain. Swelling in legs is improving. Tmax over past 24 hours 101.83F.   Objective: Vitals:   06/13/18 1302 06/13/18 1312 06/13/18 1330 06/13/18 1430  BP: (!) 107/59 111/63 110/67 122/66  Pulse: 88 88 61   Resp: 14 16 (!) 21 17  Temp:      TempSrc:      SpO2: 95% 94% 99%   Weight:      Height:        Intake/Output Summary (Last 24 hours) at 06/13/2018 1513 Last data filed at 06/13/2018 1214 Gross per 24 hour  Intake 565.18 ml  Output 700 ml  Net -134.82 ml   Filed Weights   06/11/18 0500 06/12/18 0352 06/13/18 1016  Weight: 77 kg 79.3 kg 79.3 kg   Gen: Ill-appearing male in no distress Pulm: Nonlabored with supplemental oxygen. No focal sounds. CV: Regular tachycardia. +Systolic murmur over precordium, no rub, or gallop. No JVD, trace dependent edema. GI: Abdomen soft, non-tender, non-distended, with normoactive bowel sounds.  Ext: Warm, no deformities Skin: Unroofed blister on sole of right forefoot is stable. Diffuse tattoos. No other rashes, lesions or ulcers on visualized skin.  Neuro: Alert and oriented. No focal neurological deficits. Psych: Judgement and insight appear impaired. Behavior is appropriate.    Data Reviewed: I have personally reviewed following labs and imaging studies  CBC:  Recent Labs  Lab 06/08/18 0742 06/09/18 0315 06/10/18 0314 06/11/18 0326 06/12/18 0343 06/13/18 0328  WBC 21.2* 19.1* 19.4* 18.5* 17.5* 18.9*  NEUTROABS 18.8  --   --   --   --   --   HGB 12.4* 10.2* 9.7* 9.6* 8.9* 8.2*  HCT 35.9* 29.7* 29.1* 28.4* 26.4* 24.7*  MCV 84.9 85.6 86.4 86.6 87.4 87.6  PLT 103* 104* 108* 157 194 258   Basic Metabolic Panel: Recent  Labs  Lab 06/09/18 0315 06/10/18 0314 06/11/18 0326 06/12/18 0343 06/13/18 0328  NA 128* 129* 128* 126* 127*  K 3.9 4.2 3.7 3.6 4.1  CL 92* 94* 92* 90* 90*  CO2 27 27 27 28 29   GLUCOSE 112* 115* 88 116* 110*  BUN 18 22* 21* 22* 20  CREATININE 0.69 0.83 1.01 1.08 1.03  CALCIUM 7.2* 7.1* 7.2* 7.5* 7.3*   GFR: Estimated Creatinine Clearance: 102.6 mL/min (by C-G formula based on SCr of 1.03 mg/dL). Liver Function Tests: Recent Labs  Lab 06/08/18 0742 06/10/18 0314 06/12/18 0951  AST 191* 72*  --   ALT 87* 36  --   ALKPHOS 122 105  --   BILITOT 3.9* 1.3* 1.6*  PROT 6.1* 4.6*  --   ALBUMIN 2.2* 1.4*  --    Recent Labs  Lab 06/08/18 0742  LIPASE 29   No results for input(s): AMMONIA in the last 168 hours. Coagulation Profile: No results for input(s): INR, PROTIME in the last 168 hours. Cardiac Enzymes: No results for input(s): CKTOTAL, CKMB, CKMBINDEX, TROPONINI in the last 168 hours. BNP (last 3 results) No results for input(s): PROBNP in the last 8760 hours. HbA1C: No results for input(s): HGBA1C in the last 72 hours. CBG: No results for input(s): GLUCAP in the last 168 hours. Lipid Profile: No results for input(s): CHOL, HDL, LDLCALC, TRIG, CHOLHDL, LDLDIRECT in the last 72 hours. Thyroid Function Tests: No results for input(s): TSH, T4TOTAL, FREET4, T3FREE, THYROIDAB in the last 72 hours. Anemia Panel: No results for input(s): VITAMINB12, FOLATE, FERRITIN, TIBC, IRON, RETICCTPCT in the last 72 hours. Urine analysis:    Component Value Date/Time   COLORURINE AMBER (A) 06/08/2018 0811   APPEARANCEUR HAZY (A) 06/08/2018 0811   LABSPEC 1.017 06/08/2018 0811   PHURINE 5.0 06/08/2018 0811   GLUCOSEU NEGATIVE 06/08/2018 0811   HGBUR MODERATE (A) 06/08/2018 0811   BILIRUBINUR NEGATIVE 06/08/2018 0811   KETONESUR NEGATIVE 06/08/2018 0811   PROTEINUR 30 (A) 06/08/2018 0811   UROBILINOGEN 2.0 (H) 12/17/2013 0848   NITRITE NEGATIVE 06/08/2018 0811   LEUKOCYTESUR  NEGATIVE 06/08/2018 0811   Recent Results (from the past 240 hour(s))  Blood Culture (routine x 2)     Status: Abnormal   Collection Time: 06/08/18  7:47 AM  Result Value Ref Range Status   Specimen Description   Final    BLOOD RIGHT ARM Performed at [facility], [address], [place], [place] [address]    Special Requests   Final    BOTTLES DRAWN AEROBIC AND ANAEROBIC Blood Culture results may not be optimal due to an excessive volume of blood received in culture bottles Performed at [facility], [address], [place], [place] [address]    Culture  Setup Time   Final    GRAM POSITIVE COCCI IN BOTH AEROBIC AND ANAEROBIC BOTTLES CRITICAL RESULT CALLED TO, READ BACK BY AND VERIFIED WITH: PHARMD N GLOGOVAC [phone] MLM    Culture (A)  Final  STAPHYLOCOCCUS AUREUS BACILLUS SPECIES Standardized susceptibility testing for this organism is not available. CRITICAL RESULT CALLED TO, READ BACK BY AND VERIFIED WITH: PHARMD MEREDITH R [id number] FCP Performed at [facility], [address] [place], [place] [address]    Report Status 06/11/2018 FINAL  Final   Organism ID, Bacteria STAPHYLOCOCCUS AUREUS  Final      Susceptibility   Staphylococcus aureus - MIC*    CIPROFLOXACIN <=0.5 SENSITIVE Sensitive     ERYTHROMYCIN >=8 RESISTANT Resistant     GENTAMICIN <=0.5 SENSITIVE Sensitive     OXACILLIN 0.5 SENSITIVE Sensitive     TETRACYCLINE <=1 SENSITIVE Sensitive     VANCOMYCIN 1 SENSITIVE Sensitive     TRIMETH/SULFA <=10 SENSITIVE Sensitive     CLINDAMYCIN <=0.25 SENSITIVE Sensitive     RIFAMPIN <=0.5 SENSITIVE Sensitive     Inducible Clindamycin NEGATIVE Sensitive     * STAPHYLOCOCCUS AUREUS  Blood Culture ID Panel (Reflexed)     Status: Abnormal   Collection Time: 06/08/18  7:47 AM  Result Value Ref Range Status   Enterococcus species NOT DETECTED NOT DETECTED Final   Listeria monocytogenes NOT DETECTED NOT DETECTED Final    Staphylococcus species DETECTED (A) NOT DETECTED Final    Comment: CRITICAL RESULT CALLED TO, READ BACK BY AND VERIFIED WITH: PHARMD N GLOGOVAC [phone] MLM    Staphylococcus aureus (BCID) DETECTED (A) NOT DETECTED Final    Comment: Methicillin (oxacillin) susceptible Staphylococcus aureus (MSSA). Preferred therapy is anti staphylococcal beta lactam antibiotic (Cefazolin or Nafcillin), unless clinically contraindicated. CRITICAL RESULT CALLED TO, READ BACK BY AND VERIFIED WITH: PHARMD N GLOGOVAC [phone] MLM    Methicillin resistance NOT DETECTED NOT DETECTED Final   Streptococcus species NOT DETECTED NOT DETECTED Final   Streptococcus agalactiae NOT DETECTED NOT DETECTED Final   Streptococcus pneumoniae NOT DETECTED NOT DETECTED Final   Streptococcus pyogenes NOT DETECTED NOT DETECTED Final   Acinetobacter baumannii NOT DETECTED NOT DETECTED Final   Enterobacteriaceae species NOT DETECTED NOT DETECTED Final   Enterobacter cloacae complex NOT DETECTED NOT DETECTED Final   Escherichia coli NOT DETECTED NOT DETECTED Final   Klebsiella oxytoca NOT DETECTED NOT DETECTED Final   Klebsiella pneumoniae NOT DETECTED NOT DETECTED Final   Proteus species NOT DETECTED NOT DETECTED Final   Serratia marcescens NOT DETECTED NOT DETECTED Final   Haemophilus influenzae NOT DETECTED NOT DETECTED Final   Neisseria meningitidis NOT DETECTED NOT DETECTED Final   Pseudomonas aeruginosa NOT DETECTED NOT DETECTED Final   Candida albicans NOT DETECTED NOT DETECTED Final   Candida glabrata NOT DETECTED NOT DETECTED Final   Candida krusei NOT DETECTED NOT DETECTED Final   Candida parapsilosis NOT DETECTED NOT DETECTED Final   Candida tropicalis NOT DETECTED NOT DETECTED Final    Comment: Performed at [facility], [address], [place], [place] [address]  Blood Culture (routine x 2)     Status: Abnormal   Collection Time: 06/08/18  7:55 AM  Result Value Ref Range Status   Specimen Description    Final    BLOOD RIGHT WRIST Performed at [facility], [address], [place], [place] [address]    Special Requests   Final    BOTTLES DRAWN AEROBIC AND ANAEROBIC Blood Culture results may not be optimal due to an excessive volume of blood received in culture bottles Performed at [facility], [address] [place], [place] [address]    Culture  Setup Time  Final    GRAM POSITIVE COCCI IN BOTH AEROBIC AND ANAEROBIC BOTTLES CRITICAL VALUE NOTED.  VALUE IS CONSISTENT WITH PREVIOUSLY REPORTED AND CALLED VALUE.    Culture (A)  Final    STAPHYLOCOCCUS AUREUS SUSCEPTIBILITIES PERFORMED ON PREVIOUS CULTURE WITHIN THE LAST 5 DAYS. Performed at [facility], [address] [place], [place] [address]    Report Status 06/11/2018 FINAL  Final  Culture, sputum-assessment     Status: None   Collection Time: 06/08/18  3:06 PM  Result Value Ref Range Status   Specimen Description EXPECTORATED SPUTUM  Final   Special Requests NONE  Final   Sputum evaluation   Final    THIS SPECIMEN IS ACCEPTABLE FOR SPUTUM CULTURE Performed at [facility], [address], [place], [place] [address]    Report Status 06/08/2018 FINAL  Final  Culture, respiratory     Status: None   Collection Time: 06/08/18  3:06 PM  Result Value Ref Range Status   Specimen Description   Final    EXPECTORATED SPUTUM Performed at [facility], [address] [place], [place] [address]    Special Requests   Final    NONE Reflexed from [phone] Performed at [facility], [address] [place], [place] [address]    Gram Stain   Final    ABUNDANT WBC PRESENT,BOTH PMN AND MONONUCLEAR FEW GRAM POSITIVE COCCI FEW GRAM NEGATIVE RODS FEW SQUAMOUS EPITHELIAL CELLS PRESENT Performed at [facility], [address], [place], [place] [address]    Culture FEW STAPHYLOCOCCUS AUREUS  Final   Report Status 06/11/2018 FINAL   Final   Organism ID, Bacteria STAPHYLOCOCCUS AUREUS  Final      Susceptibility   Staphylococcus aureus - MIC*    CIPROFLOXACIN <=0.5 SENSITIVE Sensitive     ERYTHROMYCIN >=8 RESISTANT Resistant     GENTAMICIN <=0.5 SENSITIVE Sensitive     OXACILLIN 0.5 SENSITIVE Sensitive     TETRACYCLINE <=1 SENSITIVE Sensitive     VANCOMYCIN 1 SENSITIVE Sensitive     TRIMETH/SULFA <=10 SENSITIVE Sensitive     CLINDAMYCIN <=0.25 SENSITIVE Sensitive     RIFAMPIN <=0.5 SENSITIVE Sensitive     Inducible Clindamycin NEGATIVE Sensitive     * FEW STAPHYLOCOCCUS AUREUS  MRSA PCR Screening     Status: None   Collection Time: 06/08/18  3:22 PM  Result Value Ref Range Status   MRSA by PCR NEGATIVE NEGATIVE Final    Comment:        The GeneXpert MRSA Assay (FDA approved for NASAL specimens only), is one component of a comprehensive MRSA colonization surveillance program. It is not intended to diagnose MRSA infection nor to guide or monitor treatment for MRSA infections. Performed at [facility], [address] [place], [place] [address]   Culture, blood (routine x 2)     Status: None (Preliminary result)   Collection Time: 06/10/18  3:14 AM  Result Value Ref Range Status   Specimen Description   Final    BLOOD LEFT ARM Performed at [facility], [address] [place], [place] [address]    Special Requests   Final    BOTTLES DRAWN AEROBIC ONLY Blood Culture adequate volume Performed at [facility], [address] [place], [place] [address]    Culture   Final    NO GROWTH 3 DAYS Performed at [facility], [address], [place], [place] [address]  Report Status PENDING  Incomplete  Culture, blood (routine x 2)     Status: None (Preliminary result)   Collection Time: 06/10/18  3:14 AM  Result Value Ref Range Status   Specimen Description   Final    BLOOD LEFT HAND Performed at [facility], [address] [place], [place] [address]    Special Requests   Final    BOTTLES DRAWN AEROBIC ONLY Blood Culture results may not be optimal due to an inadequate volume of blood received in culture bottles Performed at [facility], [address], [place], [place] [address]    Culture   Final    NO GROWTH 3 DAYS Performed at [facility], [address] [place], [place] [address]    Report Status PENDING  Incomplete      Radiology Studies: No results found.  Scheduled Meds: . buprenorphine-naloxone  1 tablet Sublingual Daily  . enoxaparin (LOVENOX) injection  40 mg Subcutaneous Q24H  . mouth rinse  15 mL Mouth Rinse BID  . polyethylene glycol  17 g Oral Daily  . risperiDONE  1 mg Oral QHS  . senna  2 tablet Oral Daily   Continuous Infusions: . sodium chloride 10 mL/hr at 06/13/18 0000  .  ceFAZolin (ANCEF) IV Stopped (06/13/18 0717)     LOS: 5 days   Time spent: 35 minutes.  Tyrone Nine, MD [facility] [email or web address] Password [email or web address] 06/13/2018, 3:13 PM

## 2018-06-13 NOTE — Transfer of Care (Addendum)
Immediate Anesthesia Transfer of Care Note  Patient: Oscar Burke  Procedure(s) Performed: TRANSESOPHAGEAL ECHOCARDIOGRAM (TEE) (N/A )  Patient Location: Endoscopy Unit  Anesthesia Type:General  Level of Consciousness: awake, alert  and oriented  Airway & Oxygen Therapy: Patient Spontanous Breathing and Patient connected to nasal cannula oxygen  Post-op Assessment: Report given to RN and Post -op Vital signs reviewed and stable  Post vital signs: Reviewed and stable  Last Vitals:  Vitals Value Taken Time  BP 100/59 06/13/2018 12:13 PM  Temp 37.6 C 06/13/2018 12:12 PM  Pulse 86 06/13/2018 12:18 PM  Resp 15 06/13/2018 12:18 PM  SpO2 95 % 06/13/2018 12:18 PM  Vitals shown include unvalidated device data.  Last Pain:  Vitals:   06/13/18 1212  TempSrc: Oral  PainSc: 0-No pain      Patients Stated Pain Goal: 0 (06/11/18 2000)  Complications: No apparent anesthesia complications

## 2018-06-13 NOTE — Progress Notes (Addendum)
[facility]   Reason for visit: Follow up on bacteremia/endocarditis  Interval History: WBC 18.9, no fever, cefazolin day #5;  TEE noted and confirmed a large TV vegetation and severe TR.     Physical Exam: Constitutional:  Vitals:   06/13/18 1430 06/13/18 1500  BP: 122/66 134/74  Pulse:  92  Resp: 17 (!) 21  Temp:    SpO2:  100%   patient appears in NAD, sitting up in chair Eyes: anicteric HENT: no thrush Respiratory: Normal respiratory effort; CTA B Cardiovascular: RRR GI: soft, nt, nd  Review of Systems: Constitutional: positive for fevers and chills or negative for anorexia Respiratory: positive for pleurisy/chest pain Gastrointestinal: negative for diarrhea  Lab Results  Component Value Date   WBC 18.9 (H) 06/13/2018   HGB 8.2 (L) 06/13/2018   HCT 24.7 (L) 06/13/2018   MCV 87.6 06/13/2018   PLT 258 06/13/2018    Lab Results  Component Value Date   CREATININE 1.03 06/13/2018   BUN 20 06/13/2018   NA 127 (L) 06/13/2018   K 4.1 06/13/2018   CL 90 (L) 06/13/2018   CO2 29 06/13/2018    Lab Results  Component Value Date   ALT 36 06/10/2018   AST 72 (H) 06/10/2018   ALKPHOS 105 06/10/2018     Microbiology: Recent Results (from the past 240 hour(s))  Blood Culture (routine x 2)     Status: Abnormal   Collection Time: 06/08/18  7:47 AM  Result Value Ref Range Status   Specimen Description   Final    BLOOD RIGHT ARM Performed at [facility], [address] [place], [place] [address]    Special Requests   Final    BOTTLES DRAWN AEROBIC AND ANAEROBIC Blood Culture results may not be optimal due to an excessive volume of blood received in culture bottles Performed at [facility], [address] [place], [place] [address]    Culture  Setup Time   Final    GRAM POSITIVE COCCI IN BOTH AEROBIC AND ANAEROBIC BOTTLES CRITICAL RESULT CALLED TO, READ BACK BY AND VERIFIED WITH: PHARMD N  GLOGOVAC [phone] MLM    Culture (A)  Final    STAPHYLOCOCCUS AUREUS BACILLUS SPECIES Standardized susceptibility testing for this organism is not available. CRITICAL RESULT CALLED TO, READ BACK BY AND VERIFIED WITH: PHARMD MEREDITH R [id number] FCP Performed at [facility], [address], [place], [place] [address]    Report Status 06/11/2018 FINAL  Final   Organism ID, Bacteria STAPHYLOCOCCUS AUREUS  Final      Susceptibility   Staphylococcus aureus - MIC*    CIPROFLOXACIN <=0.5 SENSITIVE Sensitive     ERYTHROMYCIN >=8 RESISTANT Resistant     GENTAMICIN <=0.5 SENSITIVE Sensitive     OXACILLIN 0.5 SENSITIVE Sensitive     TETRACYCLINE <=1 SENSITIVE Sensitive     VANCOMYCIN 1 SENSITIVE Sensitive     TRIMETH/SULFA <=10 SENSITIVE Sensitive     CLINDAMYCIN <=0.25 SENSITIVE Sensitive     RIFAMPIN <=0.5 SENSITIVE Sensitive     Inducible Clindamycin NEGATIVE Sensitive     * STAPHYLOCOCCUS AUREUS  Blood Culture ID Panel (Reflexed)     Status: Abnormal   Collection Time: 06/08/18  7:47 AM  Result Value Ref Range Status   Enterococcus species NOT DETECTED NOT DETECTED Final   Listeria monocytogenes NOT DETECTED NOT DETECTED Final   Staphylococcus species DETECTED (A) NOT DETECTED Final    Comment:  CRITICAL RESULT CALLED TO, READ BACK BY AND VERIFIED WITH: PHARMD N GLOGOVAC [phone] MLM    Staphylococcus aureus (BCID) DETECTED (A) NOT DETECTED Final    Comment: Methicillin (oxacillin) susceptible Staphylococcus aureus (MSSA). Preferred therapy is anti staphylococcal beta lactam antibiotic (Cefazolin or Nafcillin), unless clinically contraindicated. CRITICAL RESULT CALLED TO, READ BACK BY AND VERIFIED WITH: PHARMD N GLOGOVAC [phone] MLM    Methicillin resistance NOT DETECTED NOT DETECTED Final   Streptococcus species NOT DETECTED NOT DETECTED Final   Streptococcus agalactiae NOT DETECTED NOT DETECTED Final   Streptococcus pneumoniae NOT DETECTED NOT DETECTED Final    Streptococcus pyogenes NOT DETECTED NOT DETECTED Final   Acinetobacter baumannii NOT DETECTED NOT DETECTED Final   Enterobacteriaceae species NOT DETECTED NOT DETECTED Final   Enterobacter cloacae complex NOT DETECTED NOT DETECTED Final   Escherichia coli NOT DETECTED NOT DETECTED Final   Klebsiella oxytoca NOT DETECTED NOT DETECTED Final   Klebsiella pneumoniae NOT DETECTED NOT DETECTED Final   Proteus species NOT DETECTED NOT DETECTED Final   Serratia marcescens NOT DETECTED NOT DETECTED Final   Haemophilus influenzae NOT DETECTED NOT DETECTED Final   Neisseria meningitidis NOT DETECTED NOT DETECTED Final   Pseudomonas aeruginosa NOT DETECTED NOT DETECTED Final   Candida albicans NOT DETECTED NOT DETECTED Final   Candida glabrata NOT DETECTED NOT DETECTED Final   Candida krusei NOT DETECTED NOT DETECTED Final   Candida parapsilosis NOT DETECTED NOT DETECTED Final   Candida tropicalis NOT DETECTED NOT DETECTED Final    Comment: Performed at [facility], [address], [place], [place] [address]  Blood Culture (routine x 2)     Status: Abnormal   Collection Time: 06/08/18  7:55 AM  Result Value Ref Range Status   Specimen Description   Final    BLOOD RIGHT WRIST Performed at [facility], [address], [place], [place] [address]    Special Requests   Final    BOTTLES DRAWN AEROBIC AND ANAEROBIC Blood Culture results may not be optimal due to an excessive volume of blood received in culture bottles Performed at [facility], [address], [place], [place] [address]    Culture  Setup Time   Final    GRAM POSITIVE COCCI IN BOTH AEROBIC AND ANAEROBIC BOTTLES CRITICAL VALUE NOTED.  VALUE IS CONSISTENT WITH PREVIOUSLY REPORTED AND CALLED VALUE.    Culture (A)  Final    STAPHYLOCOCCUS AUREUS SUSCEPTIBILITIES PERFORMED ON PREVIOUS CULTURE WITHIN THE LAST 5 DAYS. Performed at [facility], [address], [place], [place] [address]     Report Status 06/11/2018 FINAL  Final  Culture, sputum-assessment     Status: None   Collection Time: 06/08/18  3:06 PM  Result Value Ref Range Status   Specimen Description EXPECTORATED SPUTUM  Final   Special Requests NONE  Final   Sputum evaluation   Final    THIS SPECIMEN IS ACCEPTABLE FOR SPUTUM CULTURE Performed at [facility], [address] [place], [place] [address]    Report Status 06/08/2018 FINAL  Final  Culture, respiratory     Status: None   Collection Time: 06/08/18  3:06 PM  Result Value Ref Range Status   Specimen Description   Final    EXPECTORATED SPUTUM Performed at [facility], [address] [place], [place] [address]    Special Requests   Final    NONE Reflexed from [id number] Performed at [facility], [address] [place], [place] [address]    Gram Stain   Final    ABUNDANT WBC PRESENT,BOTH PMN AND MONONUCLEAR FEW GRAM POSITIVE COCCI FEW GRAM NEGATIVE RODS FEW SQUAMOUS EPITHELIAL CELLS PRESENT Performed at [facility], [address], [place], [place] [address]    Culture FEW STAPHYLOCOCCUS AUREUS  Final   Report Status 06/11/2018 FINAL  Final   Organism ID, Bacteria STAPHYLOCOCCUS AUREUS  Final      Susceptibility   Staphylococcus aureus - MIC*    CIPROFLOXACIN <=0.5 SENSITIVE Sensitive     ERYTHROMYCIN >=8 RESISTANT Resistant     GENTAMICIN <=0.5 SENSITIVE Sensitive     OXACILLIN 0.5 SENSITIVE Sensitive     TETRACYCLINE <=1 SENSITIVE Sensitive     VANCOMYCIN 1 SENSITIVE Sensitive     TRIMETH/SULFA <=10 SENSITIVE Sensitive     CLINDAMYCIN <=0.25 SENSITIVE Sensitive     RIFAMPIN <=0.5 SENSITIVE Sensitive     Inducible Clindamycin NEGATIVE Sensitive     * FEW STAPHYLOCOCCUS AUREUS  MRSA PCR Screening     Status: None   Collection Time: 06/08/18  3:22 PM  Result Value Ref Range Status   MRSA by PCR NEGATIVE NEGATIVE Final    Comment:        The GeneXpert MRSA Assay  (FDA approved for NASAL specimens only), is one component of a comprehensive MRSA colonization surveillance program. It is not intended to diagnose MRSA infection nor to guide or monitor treatment for MRSA infections. Performed at [facility], [address], [place], [place] [address]   Culture, blood (routine x 2)     Status: None (Preliminary result)   Collection Time: 06/10/18  3:14 AM  Result Value Ref Range Status   Specimen Description   Final    BLOOD LEFT ARM Performed at [facility], [address], [place], [place] [address]    Special Requests   Final    BOTTLES DRAWN AEROBIC ONLY Blood Culture adequate volume Performed at [facility], [address], [place], [place] [address]    Culture   Final    NO GROWTH 3 DAYS Performed at [facility], [address] [place], [place] [address]    Report Status PENDING  Incomplete  Culture, blood (routine x 2)     Status: None (Preliminary result)   Collection Time: 06/10/18  3:14 AM  Result Value Ref Range Status   Specimen Description   Final    BLOOD LEFT HAND Performed at [facility], [address], [place], [place] [address]    Special Requests   Final    BOTTLES DRAWN AEROBIC ONLY Blood Culture results may not be optimal due to an inadequate volume of blood received in culture bottles Performed at [facility], [address] [place], [place] [address]    Culture   Final    NO GROWTH 3 DAYS Performed at [facility], 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 With a large size, he may have intermittent high fever ongoing.     Continue 6 weeks of IV cefazolin.    2.  Hepatitis C - could offer him treatment as outpatient  3.  Substance abuse - on suboxone   Discussed with nursing.

## 2018-06-13 NOTE — Anesthesia Procedure Notes (Signed)
Procedure Name: MAC Date/Time: 06/13/2018 11:50 AM Performed by: Eligha Bridegroom, CRNA Pre-anesthesia Checklist: Patient identified, Suction available, Patient being monitored and Timeout performed Patient Re-evaluated:Patient Re-evaluated prior to induction Oxygen Delivery Method: Nasal cannula Preoxygenation: Pre-oxygenation with 100% oxygen Induction Type: IV induction

## 2018-06-13 NOTE — Progress Notes (Signed)
   06/13/18 1100  Clinical Encounter Type  Visit Type Initial  Referral From Nurse  [facility] consult made however, patient has no bed assignment. Can't find patient. When patient is assigned please put in another consult or page. Leane Call [phone]

## 2018-06-14 ENCOUNTER — Encounter (HOSPITAL_COMMUNITY): Payer: Self-pay

## 2018-06-14 DIAGNOSIS — I361 Nonrheumatic tricuspid (valve) insufficiency: Secondary | ICD-10-CM

## 2018-06-14 DIAGNOSIS — J9621 Acute and chronic respiratory failure with hypoxia: Secondary | ICD-10-CM

## 2018-06-14 DIAGNOSIS — I330 Acute and subacute infective endocarditis: Secondary | ICD-10-CM

## 2018-06-14 DIAGNOSIS — F1994 Other psychoactive substance use, unspecified with psychoactive substance-induced mood disorder: Secondary | ICD-10-CM

## 2018-06-14 DIAGNOSIS — I368 Other nonrheumatic tricuspid valve disorders: Secondary | ICD-10-CM

## 2018-06-14 LAB — HAPTOGLOBIN: HAPTOGLOBIN: 307 mg/dL — AB (ref 34–200)

## 2018-06-14 NOTE — Clinical Social Work Note (Signed)
Clinical Social Work Assessment  Patient Details  Name: Oscar Burke MRN: [id number] Date of Birth: 1981/01/18  Date of referral:  06/14/18               Reason for consult:  [employer], Substance Use/ETOH Abuse                Permission sought to share information with:    Permission granted to share information::     Name::        Agency::     Relationship::     Contact Information:     Housing/Transportation Living arrangements for the past 2 months:  Single Family Home Source of Information:  Patient Patient Interpreter Needed:  None Criminal Activity/Legal Involvement Pertinent to Current Situation/Hospitalization:  No - Comment as needed Significant Relationships:  Significant Other Lives with:  Significant Other Do you feel safe going back to the place where you live?  Yes Need for family participation in patient care:  No (Coment)  Care giving concerns: Patient positive for opiates on admission. History of IV drug use.    Social Worker assessment / plan: CSW met with patient at bedside. Patient lethargic and difficult to engage in conversation. Patient sitting up in bedside chair and insisted he could speak with CSW, but during conversation nodded off several times. CSW introduced self and role and discussed reason for consult - substance use. Patient endorsed history of drug use. CSW reflected patient positive for opiates on admission.   Patient reported history of substance use treatment at [facility]. Patient nodded off after CSW questions about patient motivation to continue in recovery from substance use.   CSW provided list of substance use resources - inpatient and outpatient and plan to meet with patient when patient more alert. CSW will review resources with patient and answer any questions. CSW to follow.  Employment status:    Insurance information:  Self Pay (Medicaid Pending) PT Recommendations:  Not assessed at this time Information / Referral to  community resources:  Residential Substance Abuse Treatment Options, Outpatient Substance Abuse Treatment Options, Other (Comment Required)([facility], [facility])  Patient/Family's Response to care: Patient lethargic.  Patient/Family's Understanding of and Emotional Response to Diagnosis, Current Treatment, and Prognosis:  Not discussed.  Emotional Assessment Appearance:  Appears stated age Attitude/Demeanor/Rapport:  Lethargic Affect (typically observed):  In denial, Flat Orientation:  Oriented to Self, Oriented to Place, Oriented to  Time, Oriented to Situation Alcohol / Substance use:  Alcohol Use, Illicit Drugs Psych involvement (Current and /or in the community):  No (Comment)  Discharge Needs  Concerns to be addressed:  Substance Abuse Concerns, Mental Health Concerns Readmission within the last 30 days:  No Current discharge risk:  Psychiatric Illness, Substance Abuse Barriers to Discharge:  Continued Medical Work up   Estanislado Emms, LCSW 06/14/2018, 2:11 PM

## 2018-06-14 NOTE — Progress Notes (Signed)
[place] TEAM 1 - Stepdown/ICU TEAM  Oscar Burke  WUJ:[id number] DOB: 05-Jul-1981 DOA: 06/08/2018 PCP: Patient, No Pcp Per    Brief Narrative:  37 y.o. male with a history of tobacco use, IV heroin use, untreated bipolar disorder, and chronic hepatitis C who presented 10/2 with several days of fever, body aches, abdominal discomfort, pleuritic chest pain and mild cough. In the ED he was febrile to 103F with petechial rash to the lower extremities and hypoxia requiring 3-4L O2. CT abdomen noted no significant intraabdominal pathology, but demonstrated bilateral cavitary pulmonary nodules with CXR also showing multifocal pneumonia. 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 ID consulted and recommended 6 weeks IV ancef. TEE subsequently performed 10/7 showing a large (2cm) vegetation on the lateral leaflet of the tricuspid valve with severe TR. Cardiothoracic surgery was consulted.  Subjective: No new complaints today. Resting comfortably in bed. Reports ongoing low back pain. Denies cp or sob.   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 aureus.  - resolved w/ pt now stable on RA   Anemia - lab studies not c/w hemolysis - suspect this is due to ongoing smouldering infection - recheck Hgb in AM  IV heroin use, opioid use disorder - Last heroin use was morning of 10/2 - started on suboxone, and  tramadol prn - taper suboxone to off if continues to do well  Alcohol use, polysubstance abuse - cont to counsel on need for abstinence - CSW to advise on community resources    Hyponatremia - stable - follow off IVF   Bipolar disorder - Psychiatry consulted, recommending restart risperidone 1mg  qHS which is ordered - Needs to follow up with [facility] as outpatient per Psychiatry  Tobacco use - Declined nicotine patch - Cessation counseling provided for this and other substances  DVT prophylaxis: lovenox  Code Status: FULL CODE Family Communication: no family present at time of exam  Disposition Plan: transfer to tele bed - cont IV abx   Consultants:  ID TCTS Cardiology   Antimicrobials:  Vancomycin, cefepime, flagyl 10/2 > 10/3 Ancef 10/3 >   Objective: Blood pressure 113/66, pulse 89, temperature 100.3 F (37.9 C), temperature source Oral, resp. rate (!) 22, height 5\' 11"  (1.803 m), weight 79.3 kg, SpO2 95 %.  Intake/Output Summary (Last 24 hours) at 06/14/2018 1444 Last data filed at 06/14/2018 0600 Gross per 24 hour  Intake 939.54 ml  Output 950 ml  Net -10.46 ml   Filed Weights   06/13/18 1016 06/13/18 1941 06/14/18 0500  Weight: 79.3 kg 78.2 kg 79.3 kg    Examination: General: No acute respiratory distress Lungs: Clear to auscultation bilaterally without wheezes or crackles Cardiovascular: Regular rate and rhythm without murmur gallop or rub normal S1 and S2 Abdomen: Nontender, nondistended, soft, bowel sounds positive, no rebound, no ascites, no appreciable mass Extremities: No significant cyanosis, clubbing, or edema bilateral lower extremities  CBC: Recent Labs  Lab 06/08/18 0742  06/11/18 0326  06/12/18 0343 06/13/18 0328  WBC 21.2*   < > 18.5* 17.5* 18.9*  NEUTROABS 18.8  --   --   --   --   HGB 12.4*   < > 9.6* 8.9* 8.2*  HCT 35.9*   < > 28.4* 26.4* 24.7*  MCV 84.9   < > 86.6 87.4 87.6  PLT 103*   < > 157 194 258   < > = values in this  interval not displayed.   Basic Metabolic Panel: Recent Labs  Lab 06/12/18 0343 06/13/18 0328 06/13/18 1935  NA 126* 127* 126*  K 3.6 4.1 4.3  CL 90* 90* 89*  CO2 28 29 28   GLUCOSE 116* 110* 99  BUN 22* 20 22*  CREATININE 1.08 1.03 1.20  CALCIUM 7.5* 7.3* 7.3*   GFR: Estimated Creatinine Clearance: 88 mL/min (by C-G formula based on SCr of 1.2 mg/dL).  Liver Function Tests: Recent Labs  Lab 06/08/18 0742 06/10/18 0314 06/12/18 0951  AST 191* 72*  --   ALT 87* 36  --   ALKPHOS 122 105  --   BILITOT 3.9* 1.3* 1.6*  PROT 6.1* 4.6*  --   ALBUMIN 2.2* 1.4*  --    Recent Labs  Lab 06/08/18 0742  LIPASE 29    Recent Results (from the past 240 hour(s))  Blood Culture (routine x 2)     Status: Abnormal   Collection Time: 06/08/18  7:47 AM  Result Value Ref Range Status   Specimen Description   Final    BLOOD RIGHT ARM Performed at [facility], [address], [place], [place] [address]    Special Requests   Final    BOTTLES DRAWN AEROBIC AND ANAEROBIC Blood Culture results may not be optimal due to an excessive volume of blood received in culture bottles Performed at [facility], [address], [place], [place] [address]    Culture  Setup Time   Final    GRAM POSITIVE COCCI IN BOTH AEROBIC AND ANAEROBIC BOTTLES CRITICAL RESULT CALLED TO, READ BACK BY AND VERIFIED WITH: PHARMD N GLOGOVAC [phone] MLM    Culture (A)  Final    STAPHYLOCOCCUS AUREUS BACILLUS SPECIES Standardized susceptibility testing for this organism is not available. CRITICAL RESULT CALLED TO, READ BACK BY AND VERIFIED WITH: PHARMD MEREDITH R [id number] FCP Performed at [facility], [address] [place], [place] [address]    Report Status 06/11/2018 FINAL  Final   Organism ID, Bacteria STAPHYLOCOCCUS AUREUS  Final      Susceptibility   Staphylococcus aureus - MIC*    CIPROFLOXACIN <=0.5 SENSITIVE Sensitive     ERYTHROMYCIN >=8 RESISTANT  Resistant     GENTAMICIN <=0.5 SENSITIVE Sensitive     OXACILLIN 0.5 SENSITIVE Sensitive     TETRACYCLINE <=1 SENSITIVE Sensitive     VANCOMYCIN 1 SENSITIVE Sensitive     TRIMETH/SULFA <=10 SENSITIVE Sensitive     CLINDAMYCIN <=0.25 SENSITIVE Sensitive     RIFAMPIN <=0.5 SENSITIVE Sensitive     Inducible Clindamycin NEGATIVE Sensitive     * STAPHYLOCOCCUS AUREUS  Blood Culture ID Panel (Reflexed)     Status: Abnormal   Collection Time: 06/08/18  7:47 AM  Result Value Ref Range Status   Enterococcus species NOT DETECTED NOT DETECTED Final   Listeria monocytogenes NOT DETECTED NOT DETECTED Final   Staphylococcus species DETECTED (A) NOT DETECTED Final    Comment: CRITICAL RESULT CALLED TO, READ BACK BY AND VERIFIED  WITH: [employer] GLOGOVAC [phone] MLM    Staphylococcus aureus (BCID) DETECTED (A) NOT DETECTED Final    Comment: Methicillin (oxacillin) susceptible Staphylococcus aureus (MSSA). Preferred therapy is anti staphylococcal beta lactam antibiotic (Cefazolin or Nafcillin), unless clinically contraindicated. CRITICAL RESULT CALLED TO, READ BACK BY AND VERIFIED WITH: PHARMD N GLOGOVAC [phone] MLM    Methicillin resistance NOT DETECTED NOT DETECTED Final   Streptococcus species NOT DETECTED NOT DETECTED Final   Streptococcus agalactiae NOT DETECTED NOT DETECTED Final   Streptococcus pneumoniae NOT DETECTED NOT DETECTED Final   Streptococcus pyogenes NOT DETECTED NOT DETECTED Final   Acinetobacter baumannii NOT DETECTED NOT DETECTED Final   Enterobacteriaceae species NOT DETECTED NOT DETECTED Final   Enterobacter cloacae complex NOT DETECTED NOT DETECTED Final   Escherichia coli NOT DETECTED NOT DETECTED Final   Klebsiella oxytoca NOT DETECTED NOT DETECTED Final   Klebsiella pneumoniae NOT DETECTED NOT DETECTED Final   Proteus species NOT DETECTED NOT DETECTED Final   Serratia marcescens NOT DETECTED NOT DETECTED Final   Haemophilus influenzae NOT DETECTED NOT DETECTED  Final   Neisseria meningitidis NOT DETECTED NOT DETECTED Final   Pseudomonas aeruginosa NOT DETECTED NOT DETECTED Final   Candida albicans NOT DETECTED NOT DETECTED Final   Candida glabrata NOT DETECTED NOT DETECTED Final   Candida krusei NOT DETECTED NOT DETECTED Final   Candida parapsilosis NOT DETECTED NOT DETECTED Final   Candida tropicalis NOT DETECTED NOT DETECTED Final    Comment: Performed at [facility], [address], [place], [place] [address]  Blood Culture (routine x 2)     Status: Abnormal   Collection Time: 06/08/18  7:55 AM  Result Value Ref Range Status   Specimen Description   Final    BLOOD RIGHT WRIST Performed at [facility], [address] [place], [place] [address]    Special Requests   Final    BOTTLES DRAWN AEROBIC AND ANAEROBIC Blood Culture results may not be optimal due to an excessive volume of blood received in culture bottles Performed at [facility], [address], [place], [place] [address]    Culture  Setup Time   Final    GRAM POSITIVE COCCI IN BOTH AEROBIC AND ANAEROBIC BOTTLES CRITICAL VALUE NOTED.  VALUE IS CONSISTENT WITH PREVIOUSLY REPORTED AND CALLED VALUE.    Culture (A)  Final    STAPHYLOCOCCUS AUREUS SUSCEPTIBILITIES PERFORMED ON PREVIOUS CULTURE WITHIN THE LAST 5 DAYS. Performed at [facility], [address], [place], [place] [address]    Report Status 06/11/2018 FINAL  Final  Culture, sputum-assessment     Status: None   Collection Time: 06/08/18  3:06 PM  Result Value Ref Range Status   Specimen Description EXPECTORATED SPUTUM  Final   Special Requests NONE  Final   Sputum evaluation   Final    THIS SPECIMEN IS ACCEPTABLE FOR SPUTUM CULTURE Performed at [facility], [address], [place], [place] [address]    Report Status 06/08/2018 FINAL  Final  Culture, respiratory     Status: None   Collection Time: 06/08/18  3:06 PM  Result Value Ref Range  Status   Specimen Description   Final    EXPECTORATED SPUTUM Performed at [facility], [address], [place], [place] [address]    Special Requests   Final    NONE Reflexed from [id number] Performed at [facility], [address], [place], [place] [address]  Gram Stain   Final    ABUNDANT WBC PRESENT,BOTH PMN AND MONONUCLEAR FEW GRAM POSITIVE COCCI FEW GRAM NEGATIVE RODS FEW SQUAMOUS EPITHELIAL CELLS PRESENT Performed at [facility], [address] [place], [place] [address]    Culture FEW STAPHYLOCOCCUS AUREUS  Final   Report Status 06/11/2018 FINAL  Final   Organism ID, Bacteria STAPHYLOCOCCUS AUREUS  Final      Susceptibility   Staphylococcus aureus - MIC*    CIPROFLOXACIN <=0.5 SENSITIVE Sensitive     ERYTHROMYCIN >=8 RESISTANT Resistant     GENTAMICIN <=0.5 SENSITIVE Sensitive     OXACILLIN 0.5 SENSITIVE Sensitive     TETRACYCLINE <=1 SENSITIVE Sensitive     VANCOMYCIN 1 SENSITIVE Sensitive     TRIMETH/SULFA <=10 SENSITIVE Sensitive     CLINDAMYCIN <=0.25 SENSITIVE Sensitive     RIFAMPIN <=0.5 SENSITIVE Sensitive     Inducible Clindamycin NEGATIVE Sensitive     * FEW STAPHYLOCOCCUS AUREUS  MRSA PCR Screening     Status: None   Collection Time: 06/08/18  3:22 PM  Result Value Ref Range Status   MRSA by PCR NEGATIVE NEGATIVE Final    Comment:        The GeneXpert MRSA Assay (FDA approved for NASAL specimens only), is one component of a comprehensive MRSA colonization surveillance program. It is not intended to diagnose MRSA infection nor to guide or monitor treatment for MRSA infections. Performed at [facility], [address], [place], [place] [address]   Culture, blood (routine x 2)     Status: None (Preliminary result)   Collection Time: 06/10/18  3:14 AM  Result Value Ref Range Status   Specimen Description   Final    BLOOD LEFT ARM Performed at [facility], [address], [place], [place] [address]    Special Requests   Final    BOTTLES DRAWN AEROBIC ONLY Blood Culture adequate volume Performed at [facility], [address] [place], [place] [address]    Culture   Final    NO GROWTH 4 DAYS Performed at [facility], [address] [place], [place] [address]    Report Status PENDING  Incomplete  Culture, blood (routine x 2)     Status: None (Preliminary result)   Collection Time: 06/10/18  3:14 AM  Result Value Ref Range Status   Specimen Description   Final    BLOOD LEFT HAND Performed at [facility], [address], [place], [place] [address]    Special Requests   Final    BOTTLES DRAWN AEROBIC ONLY Blood Culture results may not be optimal due to an inadequate volume of blood received in culture bottles Performed at [facility], [address] [place], [place] [address]    Culture   Final    NO GROWTH 4 DAYS Performed at [facility], [address] [place], [place] [address]    Report Status PENDING  Incomplete  MRSA PCR Screening     Status: None   Collection Time: 06/13/18  7:55 PM  Result Value Ref Range Status   MRSA by PCR NEGATIVE NEGATIVE Final    Comment:        The GeneXpert MRSA Assay (FDA approved for NASAL specimens only), is one component of a comprehensive MRSA colonization surveillance program. It is not intended to diagnose MRSA infection nor to guide or monitor treatment for MRSA infections. Performed at [facility], [address] [place], [place] [address]      Scheduled Meds: . buprenorphine-naloxone  1 tablet Sublingual Daily  . enoxaparin (LOVENOX) injection  40 mg Subcutaneous Q24H  . mouth rinse  15 mL Mouth Rinse BID  . polyethylene glycol  17 g Oral Daily  . risperiDONE  1 mg Oral QHS  . senna  2 tablet Oral Daily   Continuous Infusions: . sodium chloride 10 mL/hr at 06/14/18 0600  .  ceFAZolin (ANCEF) IV 2 g  (06/14/18 1018)     LOS: 6 days   Lonia Blood, MD [facility] Office  [phone] Pager - Text Page per Amion  If 7PM-7AM, please contact night-coverage per Amion 06/14/2018, 2:44 PM

## 2018-06-14 NOTE — Consult Note (Addendum)
Cardiology Consultation:   Patient ID: Oscar Burke MRN: [id number]; DOB: November 30, 1980  Admit date: 06/08/2018 Date of Consult: 06/14/2018  Primary Care Provider: Patient, No Pcp Per Primary Cardiologist: New (Dr. Eden Emms performed TEE 10/7)  Patient Profile:   Oscar Burke is a 37 y.o. 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 Jarvis Newcomer, Internal Medicine.   History of Present Illness:   Oscar Burke presented initially to the [facility] ED on 06/08/18 with multiple complaints including fever, body aches, pleuritic CP, cough and abdominal discomfort. Found to be febrile in the ED with temp up to 103 F. Also with LE petechial rash and hypoxia. CT of abdomin w/o significant intraabdominal pathology. CXR concerning for multifocal PNA. Blood cultures drawn and pt placed on broad spectrum antibiotics. Found to be bacteremic w/ blood cultures + for staph aureus. Also murmur detected. TTE on 10/2 showed normal LVEF at 55-60% with normal wall motion and mild-moderate TR, with suspected TV vegetation.  This was followed by TEE performed on 06/13/18 and confirmed TV vegetation and moderate TR. Pt subsequently transferred to [facility] for formal cardiology consultation and CT surgery consultation. Also of note, chest CT suggest septic emboli given multiple poorly marginated cavitary pulmonary nodules in the lower lobes bilaterally.  Last documented temp was 100.3. WBC elevated at 18.9. Hgb 8.2. Na 126. BUN 22. SCr 1.20. Ca 7.3. He remains on antibiotics.   He is resting. Appears drowsy but responds to questions. RN reports that this has been his baseline since arriving to [facility] earlier today. He denies CP. No dyspnea. Murmur noted on exam. Bilateral pedal edema also present. NSR on tele.   Past Medical History:    Diagnosis Date  . Bipolar 1 disorder (HCC)   . Chronic back pain   . Hepatitis C   . Polysubstance abuse (HCC)   . Smoker     Past Surgical History:  Procedure Laterality Date  . ARM AMPUTATION    . MANDIBLE FRACTURE SURGERY       Home Medications:  Prior to Admission medications   Medication Sig Start Date End Date Taking? Authorizing Provider  loperamide (IMODIUM) 2 MG capsule Take 1 capsule (2 mg total) by mouth 4 (four) times daily as needed for diarrhea or loose stools. Patient not taking: Reported on 06/02/2018 11/19/17   Caccavale, Sophia, PA-C  meloxicam (MOBIC) 7.5 MG tablet Take 1 tablet (7.5 mg total) by mouth daily. Patient not taking: Reported on 11/19/2017 04/01/17   Cristina Gong, PA-C  ondansetron (ZOFRAN) 4 MG tablet Take 1 tablet (4 mg total) by mouth every 8 (eight) hours as needed for nausea or vomiting. Patient not taking: Reported on 06/02/2018 11/19/17   Caccavale, Sophia, PA-C  risperiDONE (RISPERDAL) 2 MG tablet Take 1 tablet (2 mg total) by mouth at bedtime. Patient not taking: Reported on 03/31/2017 07/24/14   Rolland Porter, MD  traMADol (ULTRAM) 50 MG tablet Take 1 tablet (50 mg total) by mouth every 6 (six) hours as needed. Patient not taking: Reported on 11/19/2017 10/02/17   Jacalyn Lefevre, MD    Inpatient Medications: Scheduled Meds: . buprenorphine-naloxone  1 tablet Sublingual Daily  . enoxaparin (LOVENOX) injection  40 mg Subcutaneous Q24H  . mouth rinse  15 mL Mouth Rinse BID  . polyethylene glycol  17 g Oral Daily  . risperiDONE  1 mg Oral QHS  . senna  2 tablet Oral Daily   Continuous Infusions: . sodium chloride 10 mL/hr at 06/14/18 0600  .  ceFAZolin (ANCEF) IV 2 g (06/14/18 1018)   PRN Meds: sodium chloride, acetaminophen **OR** acetaminophen, alum & mag hydroxide-simeth, bisacodyl, clonazePAM, cyclobenzaprine, guaiFENesin-dextromethorphan, ipratropium-albuterol, MUSCLE RUB, ondansetron **OR** ondansetron (ZOFRAN) IV,  traMADol  Allergies:   No Known Allergies  Social History:   Social History   Socioeconomic History  . Marital status: Single    Spouse name: Not on file  . Number of children: Not on file  . Years of education: Not on file  . Highest education level: Not on file  Occupational History  . Not on file  Social Needs  . Financial resource strain: Not on file  . Food insecurity:    Worry: Not on file    Inability: Not on file  . Transportation needs:    Medical: Not on file    Non-medical: Not on file  Tobacco Use  . Smoking status: Current Every Day Smoker    Packs/day: 1.00    Types: Cigarettes  . Smokeless tobacco: Never Used  Substance and Sexual Activity  . Alcohol use: No    Frequency: Never  . Drug use: Yes    Types: Cocaine, IV    Comment: Heroin, ice, crack, and morphine pills  . Sexual activity: Not on file  Lifestyle  . Physical activity:    Days per week: Not on file    Minutes per session: Not on file  . Stress: Not on file  Relationships  . Social connections:    Talks on phone: Not on file    Gets together: Not on file    Attends religious service: Not on file    Active member of club or organization: Not on file    Attends meetings of clubs or organizations: Not on file    Relationship status: Not on file  . Intimate partner violence:    Fear of current or ex partner: Not on file    Emotionally abused: Not on file    Physically abused: Not on file    Forced sexual activity: Not on file  Other Topics Concern  . Not on file  Social History Narrative  . Not on file    Family History:    Family History  Problem Relation Age of Onset  . Cancer Mother      ROS:  Please see the history of present illness.   All other ROS reviewed and negative.     Physical Exam/Data:   Vitals:   06/14/18 0435 06/14/18 0500 06/14/18 0802 06/14/18 1204  BP: 132/78  108/66 113/66  Pulse: 99  90 89  Resp: (!) 22   (!) 22  Temp: (!) 102.3 F (39.1 C)  99.6  F (37.6 C) 100.3 F (37.9 C)  TempSrc: Oral  Oral Oral  SpO2: 91%  95% 95%  Weight:  79.3 kg    Height:        Intake/Output Summary (Last 24 hours) at 06/14/2018 1417 Last data filed at 06/14/2018 0600 Gross per 24 hour  Intake 939.54 ml  Output 950 ml  Net -10.46 ml   Filed Weights   06/13/18 1016 06/13/18 1941 06/14/18 0500  Weight: 79.3 kg 78.2 kg 79.3 kg   Body mass index is 24.38 kg/m.  General:  disheveled looking, young WM in no acute distress, dentition in poor repair HEENT: normal Lymph: no adenopathy Neck:  no JVD Endocrine:  No thryomegaly Vascular: No carotid bruits; FA pulses 2+ bilaterally without bruits  Cardiac:  RRR, 3/6 murmur heard throughout percordium  Lungs:  clear to auscultation bilaterally, no wheezing, rhonchi or rales  Abd: soft, nontender, no hepatomegaly  Ext: bilateral pedal edema present. No osler nodes or janeway lesions  Musculoskeletal:  No deformities, BUE and BLE strength normal and equal Skin: warm and dry  Neuro:  CNs 2-12 intact, no focal abnormalities noted Psych:  Normal affect   EKG:  The EKG was personally reviewed and demonstrates:  06/09/18 NSR 99 bpm.  Telemetry:  Telemetry was personally reviewed and demonstrates:  SR 90s  Relevant CV Studies: TTE 06/08/18  Study Conclusions  - Left ventricle: The cavity size was normal. Wall thickness was   normal. Systolic function was normal. The estimated ejection   fraction was in the range of 55% to 60%. Wall motion was normal;   there were no regional wall motion abnormalities. Left   ventricular diastolic function parameters were normal. - Ventricular septum: Very mildly D-shaped interventricular septum   is concerning for RV pressure/volume overload. - Aortic valve: There was no stenosis. - Mitral valve: There was no regurgitation. - Right ventricle: The cavity size was mildly dilated. Systolic   function was normal. - Right atrium: The atrium was mildly dilated. -  Tricuspid valve: There was mild-moderate regurgitation. Suspected   tricuspid vegetation. Peak RV-RA gradient (S): 25 mm Hg. - Pulmonary arteries: PA peak pressure: 33 mm Hg (S). - Systemic veins: IVC measured 2.3 cm with > 50% respirophasic   variation, suggesting RA pressure 8 mmHg. - Pericardium, extracardiac: Small circumferential pericardial   effusion.  Impressions:  - Normal LV size and systolic function, EF 55-60%. Normal diastolic   function. Mildly D-shaped interventricular septum suggesting a   degree of RV pressure/volume overload. Mildly dilated RV with   normal systolic function. Mild to moderate TR. There does appear   to be a tricuspid valve vegetation. Need TEE to confirm.  TEE 06/13/18 Study Conclusions  - Left ventricle: Systolic function was normal. The estimated   ejection fraction was in the range of 60% to 65%. - Aortic valve: No evidence of vegetation. - Mitral valve: No evidence of vegetation. - Left atrium: No evidence of thrombus in the atrial cavity or   appendage. - Right atrium: No evidence of thrombus in the atrial cavity or   appendage. - Atrial septum: No defect or patent foramen ovale was identified. - Tricuspid valve: Large vegetation on the lateral leaflet of the   TV measuring 2 cm in longest dimension. There was moderate-severe   regurgitation. - Pulmonic valve: No evidence of vegetation.  Laboratory Data:  Chemistry Recent Labs  Lab 06/12/18 0343 06/13/18 0328 06/13/18 1935  NA 126* 127* 126*  K 3.6 4.1 4.3  CL 90* 90* 89*  CO2 28 29 28   GLUCOSE 116* 110* 99  BUN 22* 20 22*  CREATININE 1.08 1.03 1.20  CALCIUM 7.5* 7.3* 7.3*  GFRNONAA >60 >60 >60  GFRAA >60 >60 >60  ANIONGAP 8 8 9     Recent Labs  Lab 06/08/18 0742 06/10/18 0314 06/12/18 0951  PROT 6.1* 4.6*  --   ALBUMIN 2.2* 1.4*  --   AST 191* 72*  --   ALT 87* 36  --   ALKPHOS 122 105  --   BILITOT 3.9* 1.3* 1.6*   Hematology Recent Labs  Lab 06/11/18 0326  06/12/18 0343 06/13/18 0328  WBC  18.5* 17.5* 18.9*  RBC 3.28* 3.02* 2.82*  HGB 9.6* 8.9* 8.2*  HCT 28.4* 26.4* 24.7*  MCV 86.6 87.4 87.6  MCH 29.3 29.5 29.1  MCHC 33.8 33.7 33.2  RDW 14.2 14.1 14.0  PLT 157 194 258   Cardiac EnzymesNo results for input(s): TROPONINI in the last 168 hours. No results for input(s): TROPIPOC in the last 168 hours.  BNPNo results for input(s): BNP, PROBNP in the last 168 hours.  DDimer No results for input(s): DDIMER in the last 168 hours.  Radiology/Studies:  No results found.  Assessment and Plan:   Oscar Burke is a 38 y.o. 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 Jarvis Newcomer, Internal Medicine.   1. Tricuspid Valve Endocarditis/  Moderate Tricuspid Valve Regurgitation: in the setting of IVDU. Confirmed by TEE. Blood cultures + for staph aureus. On antibiotics per ID. NSR on tele. Mild bilateral pedal edema on exam but low albumin 1.4. Lungs CTAB. There dose not appear to be any emergent need for surgery at this time. Continue antibiotics.  If he fails antibiotic therapy, then he may ultimately needed surgery. Formal CT consultation pending.   2. Polysubstance Abuse/IVDU: needs counseling . Abstinence from further IVDU is imperative given risk for reinfection.    3. Bipolar Disorder/ Depression and Anxiety: management per psych.   4. Chronic Hep C: management per IM.    For questions or updates, please contact [facility] Please consult [email or web address] for contact info under     Signed, Robbie Lis, PA-C  06/14/2018 2:17 PM  I have seen and examined the patient along with Robbie Lis, PA-C .  I have reviewed the chart, notes and new data.  I agree with PA's note.  Key new complaints: sleepy, but easy to wake and  oriented Key examination changes: appears pale, chronically ill, puffy edema of hypoalbuminemia/malnutrition, faint holosystolic TR murmur, track marks all limbs Key new findings / data: albumin 1.4, elevated WBC, hyponatremic. Normal PR interval on ECG.CXR c/w septic emboli.  PLAN: I think his edema is related to hypoalbuminemia, not R heart failure. 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 No indication for CV surgery, as outlined in Dr. Orvan July note. Prolonged antibiotic therapy should suffice. The only surgical indication would be antibiotic failure/persistent bacteremia (too soon to make that assessment). He needs SW/psych support since future IVDU would be associated with even higher risk of endocarditis. Please re-consult if needed (e.g. Antibiotic failure, AV conduction abnormalities).  Thurmon Fair, MD, [facility] [phone] 06/14/2018, 3:54 PM

## 2018-06-14 NOTE — Progress Notes (Signed)
INFECTIOUS DISEASE PROGRESS NOTE  ID: Oscar Burke is a 37 y.o. male with  Principal Problem:   Substance induced mood disorder (HCC) Active Problems:   Acute on chronic respiratory failure with hypoxia (HCC)   Polysubstance abuse (HCC)   Alcohol dependence with unspecified alcohol-induced disorder (HCC)   Abdominal pain   Hepatitis C, chronic (HCC)   Endocarditis of tricuspid valve   Septic embolism (HCC)   Multifocal pneumonia   Tobacco abuse  Subjective: Resting quietly  Abtx:  Anti-infectives (From admission, onward)   Start     Dose/Rate Route Frequency Ordered Stop   06/09/18 1400  ceFAZolin (ANCEF) IVPB 2g/100 mL premix     2 g 200 mL/hr over 30 Minutes Intravenous Every 8 hours 06/09/18 0920     06/08/18 1800  vancomycin (VANCOCIN) IVPB 750 mg/150 ml premix  Status:  Discontinued     750 mg 150 mL/hr over 60 Minutes Intravenous Every 8 hours 06/08/18 1448 06/09/18 0920   06/08/18 1445  ceFEPIme (MAXIPIME) 1 g in sodium chloride 0.9 % 100 mL IVPB  Status:  Discontinued     1 g 200 mL/hr over 30 Minutes Intravenous Every 8 hours 06/08/18 1436 06/09/18 0920   06/08/18 0845  vancomycin (VANCOCIN) 1,500 mg in sodium chloride 0.9 % 500 mL IVPB     1,500 mg 250 mL/hr over 120 Minutes Intravenous  Once 06/08/18 0828 06/08/18 1222   06/08/18 0830  ceFEPIme (MAXIPIME) 2 g in sodium chloride 0.9 % 100 mL IVPB     2 g 200 mL/hr over 30 Minutes Intravenous  Once 06/08/18 0825 06/08/18 0915   06/08/18 0830  metroNIDAZOLE (FLAGYL) IVPB 500 mg  Status:  Discontinued     500 mg 100 mL/hr over 60 Minutes Intravenous Every 8 hours 06/08/18 0825 06/08/18 2103   06/08/18 0830  vancomycin (VANCOCIN) IVPB 1000 mg/200 mL premix  Status:  Discontinued     1,000 mg 200 mL/hr over 60 Minutes Intravenous  Once 06/08/18 0825 06/08/18 0827      Medications:  Scheduled: . buprenorphine-naloxone  1 tablet Sublingual Daily  . enoxaparin (LOVENOX) injection  40 mg Subcutaneous Q24H  .  mouth rinse  15 mL Mouth Rinse BID  . polyethylene glycol  17 g Oral Daily  . risperiDONE  1 mg Oral QHS  . senna  2 tablet Oral Daily    Objective: Vital signs in last 24 hours: Temp:  [99.5 F (37.5 C)-102.5 F (39.2 C)] 100.3 F (37.9 C) (10/08 1204) Pulse Rate:  [79-99] 89 (10/08 1204) Resp:  [17-22] 22 (10/08 1204) BP: (98-132)/(55-78) 113/66 (10/08 1204) SpO2:  [52 %-95 %] 95 % (10/08 1204) Weight:  [78.2 kg-79.3 kg] 79.3 kg (10/08 0500)   General appearance: no distress Resp: clear to auscultation bilaterally Cardio: regular rate and rhythm GI: normal findings: bowel sounds normal and soft, non-tender  Lab Results Recent Labs    06/12/18 0343 06/13/18 0328 06/13/18 1935  WBC 17.5* 18.9*  --   HGB 8.9* 8.2*  --   HCT 26.4* 24.7*  --   NA 126* 127* 126*  K 3.6 4.1 4.3  CL 90* 90* 89*  CO2 28 29 28   BUN 22* 20 22*  CREATININE 1.08 1.03 1.20   Liver Panel Recent Labs    06/12/18 0951  BILITOT 1.6*  BILIDIR 0.9*  IBILI 0.7   Sedimentation Rate No results for input(s): ESRSEDRATE in the last 72 hours. C-Reactive Protein No results for input(s): CRP in the  last 72 hours.  Microbiology: Recent Results (from the past 240 hour(s))  Blood Culture (routine x 2)     Status: Abnormal   Collection Time: 06/08/18  7:47 AM  Result Value Ref Range Status   Specimen Description   Final    BLOOD RIGHT ARM Performed at [facility], [address], [place], [place] [address]    Special Requests   Final    BOTTLES DRAWN AEROBIC AND ANAEROBIC Blood Culture results may not be optimal due to an excessive volume of blood received in culture bottles Performed at [facility], [address] [place], [place] [address]    Culture  Setup Time   Final    GRAM POSITIVE COCCI IN BOTH AEROBIC AND ANAEROBIC BOTTLES CRITICAL RESULT CALLED TO, READ BACK BY AND VERIFIED WITH: PHARMD N GLOGOVAC [phone]    Culture (A)  Final     STAPHYLOCOCCUS AUREUS BACILLUS SPECIES Standardized susceptibility testing for this organism is not available. CRITICAL RESULT CALLED TO, READ BACK BY AND VERIFIED WITH: PHARMD MEREDITH R [id number] FCP Performed at [facility], [address] [place], [place] [address]    Report Status 06/11/2018 FINAL  Final   Organism ID, Bacteria STAPHYLOCOCCUS AUREUS  Final      Susceptibility   Staphylococcus aureus - MIC*    CIPROFLOXACIN <=0.5 SENSITIVE Sensitive     ERYTHROMYCIN >=8 RESISTANT Resistant     GENTAMICIN <=0.5 SENSITIVE Sensitive     OXACILLIN 0.5 SENSITIVE Sensitive     TETRACYCLINE <=1 SENSITIVE Sensitive     VANCOMYCIN 1 SENSITIVE Sensitive     TRIMETH/SULFA <=10 SENSITIVE Sensitive     CLINDAMYCIN <=0.25 SENSITIVE Sensitive     RIFAMPIN <=0.5 SENSITIVE Sensitive     Inducible Clindamycin NEGATIVE Sensitive     * STAPHYLOCOCCUS AUREUS  Blood Culture ID Panel (Reflexed)     Status: Abnormal   Collection Time: 06/08/18  7:47 AM  Result Value Ref Range Status   Enterococcus species NOT DETECTED NOT DETECTED Final   Listeria monocytogenes NOT DETECTED NOT DETECTED Final   Staphylococcus species DETECTED (A) NOT DETECTED Final    Comment: CRITICAL RESULT CALLED TO, READ BACK BY AND VERIFIED WITH: PHARMD N GLOGOVAC [phone] MLM    Staphylococcus aureus (BCID) DETECTED (A) NOT DETECTED Final    Comment: Methicillin (oxacillin) susceptible Staphylococcus aureus (MSSA). Preferred therapy is anti staphylococcal beta lactam antibiotic (Cefazolin or Nafcillin), unless clinically contraindicated. CRITICAL RESULT CALLED TO, READ BACK BY AND VERIFIED WITH: PHARMD N GLOGOVAC [phone] MLM    Methicillin resistance NOT DETECTED NOT DETECTED Final   Streptococcus species NOT DETECTED NOT DETECTED Final   Streptococcus agalactiae NOT DETECTED NOT DETECTED Final   Streptococcus pneumoniae NOT DETECTED NOT DETECTED Final   Streptococcus pyogenes NOT DETECTED NOT DETECTED Final    Acinetobacter baumannii NOT DETECTED NOT DETECTED Final   Enterobacteriaceae species NOT DETECTED NOT DETECTED Final   Enterobacter cloacae complex NOT DETECTED NOT DETECTED Final   Escherichia coli NOT DETECTED NOT DETECTED Final   Klebsiella oxytoca NOT DETECTED NOT DETECTED Final   Klebsiella pneumoniae NOT DETECTED NOT DETECTED Final   Proteus species NOT DETECTED NOT DETECTED Final   Serratia marcescens NOT DETECTED NOT DETECTED Final   Haemophilus influenzae NOT DETECTED NOT DETECTED Final   Neisseria meningitidis NOT DETECTED NOT DETECTED Final   Pseudomonas aeruginosa NOT DETECTED NOT DETECTED Final   Candida albicans NOT DETECTED NOT DETECTED Final  Candida glabrata NOT DETECTED NOT DETECTED Final   Candida krusei NOT DETECTED NOT DETECTED Final   Candida parapsilosis NOT DETECTED NOT DETECTED Final   Candida tropicalis NOT DETECTED NOT DETECTED Final    Comment: Performed at [facility], [address], [place], [place] [address]  Blood Culture (routine x 2)     Status: Abnormal   Collection Time: 06/08/18  7:55 AM  Result Value Ref Range Status   Specimen Description   Final    BLOOD RIGHT WRIST Performed at [facility], [address], [place], [place] [address]    Special Requests   Final    BOTTLES DRAWN AEROBIC AND ANAEROBIC Blood Culture results may not be optimal due to an excessive volume of blood received in culture bottles Performed at [facility], [address] [place], [place] [address]    Culture  Setup Time   Final    GRAM POSITIVE COCCI IN BOTH AEROBIC AND ANAEROBIC BOTTLES CRITICAL VALUE NOTED.  VALUE IS CONSISTENT WITH PREVIOUSLY REPORTED AND CALLED VALUE.    Culture (A)  Final    STAPHYLOCOCCUS AUREUS SUSCEPTIBILITIES PERFORMED ON PREVIOUS CULTURE WITHIN THE LAST 5 DAYS. Performed at [facility], [address], [place], [place] [address]    Report Status 06/11/2018 FINAL  Final  Culture,  sputum-assessment     Status: None   Collection Time: 06/08/18  3:06 PM  Result Value Ref Range Status   Specimen Description EXPECTORATED SPUTUM  Final   Special Requests NONE  Final   Sputum evaluation   Final    THIS SPECIMEN IS ACCEPTABLE FOR SPUTUM CULTURE Performed at [facility], [address] [place], [place] [address]    Report Status 06/08/2018 FINAL  Final  Culture, respiratory     Status: None   Collection Time: 06/08/18  3:06 PM  Result Value Ref Range Status   Specimen Description   Final    EXPECTORATED SPUTUM Performed at [facility], [address], [place], [place] [address]    Special Requests   Final    NONE Reflexed from [id number] Performed at [facility], [address] [place], [place] [address]    Gram Stain   Final    ABUNDANT WBC PRESENT,BOTH PMN AND MONONUCLEAR FEW GRAM POSITIVE COCCI FEW GRAM NEGATIVE RODS FEW SQUAMOUS EPITHELIAL CELLS PRESENT Performed at [facility], [address], [place], [place] [address]    Culture FEW STAPHYLOCOCCUS AUREUS  Final   Report Status 06/11/2018 FINAL  Final   Organism ID, Bacteria STAPHYLOCOCCUS AUREUS  Final      Susceptibility   Staphylococcus aureus - MIC*    CIPROFLOXACIN <=0.5 SENSITIVE Sensitive     ERYTHROMYCIN >=8 RESISTANT Resistant     GENTAMICIN <=0.5 SENSITIVE Sensitive     OXACILLIN 0.5 SENSITIVE Sensitive     TETRACYCLINE <=1 SENSITIVE Sensitive     VANCOMYCIN 1 SENSITIVE Sensitive     TRIMETH/SULFA <=10 SENSITIVE Sensitive     CLINDAMYCIN <=0.25 SENSITIVE Sensitive     RIFAMPIN <=0.5 SENSITIVE Sensitive     Inducible Clindamycin NEGATIVE Sensitive     * FEW STAPHYLOCOCCUS AUREUS  MRSA PCR Screening     Status: None   Collection Time: 06/08/18  3:22 PM  Result Value Ref Range Status   MRSA by PCR NEGATIVE NEGATIVE Final    Comment:        The GeneXpert MRSA Assay (FDA approved  for NASAL specimens only), is one component of  a comprehensive MRSA colonization surveillance program. It is not intended to diagnose MRSA infection nor to guide or monitor treatment for MRSA infections. Performed at [facility], [address] [place], [place] [address]   Culture, blood (routine x 2)     Status: None (Preliminary result)   Collection Time: 06/10/18  3:14 AM  Result Value Ref Range Status   Specimen Description   Final    BLOOD LEFT ARM Performed at [facility], [address], [place], [place] [address]    Special Requests   Final    BOTTLES DRAWN AEROBIC ONLY Blood Culture adequate volume Performed at [facility], [address], [place], [place] [address]    Culture   Final    NO GROWTH 4 DAYS Performed at [facility], [address] [place], [place] [address]    Report Status PENDING  Incomplete  Culture, blood (routine x 2)     Status: None (Preliminary result)   Collection Time: 06/10/18  3:14 AM  Result Value Ref Range Status   Specimen Description   Final    BLOOD LEFT HAND Performed at [facility], [address] [place], [place] [address]    Special Requests   Final    BOTTLES DRAWN AEROBIC ONLY Blood Culture results may not be optimal due to an inadequate volume of blood received in culture bottles Performed at [facility] [address], [place], [place] [address]    Culture   Final    NO GROWTH 4 DAYS Performed at [facility], [address], [place], [place] [address]    Report Status PENDING  Incomplete  MRSA PCR Screening     Status: None   Collection Time: 06/13/18  7:55 PM  Result Value Ref Range Status   MRSA by PCR NEGATIVE NEGATIVE Final    Comment:        The GeneXpert MRSA Assay (FDA approved for NASAL specimens only), is one component of a comprehensive MRSA colonization surveillance program. It is not intended to diagnose MRSA infection nor to guide  or monitor treatment for MRSA infections. Performed at [facility], [address] [place], [place] [address]     Studies/Results: No results found.   Assessment/Plan: TV IE with TR (severe) MSSA bacteremia Hep C Opioid use  Total days of antibiotics: 6 ancef  appreciate CVTS and CV eval Continue ancef Will need 6 weeks of ancef Repeat BCx 10-4 are ngtd Hep C rx as outpt.  Continue suboxone opat ordered.  D/c planning (prolonged hospital vs snf vs home with po)          Johny Sax MD, FACP Infectious Diseases (pager) [phone] [email or web address] 06/14/2018, 4:02 PM  LOS: 6 days

## 2018-06-14 NOTE — Progress Notes (Signed)
COWS ASSESSMENT  1. Resting Score 0   2. Sweating Score 2   3.Restlessness  Score 0   4. Pupil Score 0   5. Bone/Muscle Aches Score 0   6. Running nose or Tearing Score 0   7. GI Upset Score 0   8. Tremors Score 2   9. Yawning Score 0   10. Anxiety or Irritability Score 0    11. Gooseflesh skin Score 3   Total Score 7

## 2018-06-14 NOTE — Progress Notes (Signed)
COW ASSESSMENT  1. Resting Score 0   2. Sweating Score 2   3. Restlessness Score 0   4. Pupils Score 0   5. Bone or Muscle aches Score 0   6. Running nose or tearing Score 0   7. GI Upset Score 0   8. Tremors Score 1   9. Yawning Score 0   10. Anxiety or Irritability Score 0    11. Gooseflesh skin 0   Total Score 3

## 2018-06-14 NOTE — Consult Note (Addendum)
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   PERFORMING   [name], M.D.  ADMITTING    Virginia Rochester K  SONOGRAPHER  Lavenia Atlas, RCS  ATTENDING    Roderic Scarce, Roe Rutherford  REFERRING    Marcelino Duster  cc:  ------------------------------------------------------------------- LV EF: 60% -   65%  ------------------------------------------------------------------- Indications:      Bacteremia  790.7.  ------------------------------------------------------------------- History:   PMH:  IV drug abuse.  Fever.  Bacteremia.  ------------------------------------------------------------------- Study Conclusions  - Left ventricle: Systolic function was normal. The estimated   ejection fraction was in the range of 60% to 65%. - Aortic valve: No evidence of vegetation. - Mitral valve: No evidence of vegetation. - Left atrium: No evidence of thrombus in the atrial cavity or   appendage. - Right atrium: No evidence of thrombus in the atrial cavity or   appendage. - Atrial septum: No defect or patent foramen ovale was identified. - Tricuspid valve: Large vegetation on the lateral leaflet of the   TV measuring 2 cm in longest dimension. There was moderate-severe   regurgitation. - Pulmonic valve: No evidence of vegetation.  ------------------------------------------------------------------- Study data:   Study status:  Routine.  Consent:  The risks, benefits, and alternatives to the procedure were explained to the patient and informed consent was obtained.  Procedure:  The patient reported no pain pre or post test. Initial setup. The patient was brought to the laboratory. Surface ECG leads were monitored. Sedation. Conscious sedation was administered by anesthesiology staff. Transesophageal echocardiography. Topical anesthesia was obtained using viscous lidocaine. A transesophageal probe was inserted by the attending cardiologistwithout difficulty. Image quality was adequate.  Study completion:  The patient tolerated the procedure well. There were no complications.  Administered medications:   Propofol.          Diagnostic transesophageal echocardiography.  2D and color Doppler.  Birthdate:  Patient birthdate: 08-Dec-1980.  Age:  Patient is 37 yr old.  Sex:  Gender: male.    BMI: 24.4 kg/m^2.  Blood pressure:     113/77  Patient status:  Inpatient.  Study date:  Study date:  06/13/2018. Study time: 11:20 AM.  Location:  Endoscopy.  -------------------------------------------------------------------  ------------------------------------------------------------------- Left ventricle:  Systolic function was normal. The estimated ejection fraction was in the range of 60% to 65%.  ------------------------------------------------------------------- Aortic valve:   Structurally normal valve. Trileaflet. Cusp separation was normal.  No evidence of vegetation.  Doppler:  There was no regurgitation.  ------------------------------------------------------------------- Aorta:  The aorta was normal, not dilated, and non-diseased.  ------------------------------------------------------------------- Mitral valve:   Structurally normal valve.   Leaflet separation was normal.  No evidence of vegetation.  Doppler:  There was no regurgitation.  ------------------------------------------------------------------- Left atrium:  The atrium was normal in size.  No evidence of thrombus in the atrial cavity or appendage.  ------------------------------------------------------------------- Atrial septum:  No defect or patent foramen ovale was identified.   ------------------------------------------------------------------- Right ventricle:  The cavity size was normal. Wall thickness was normal. Systolic function was normal.  ------------------------------------------------------------------- Pulmonic valve:    Structurally normal valve.   Cusp separation was normal.  No evidence of vegetation.  Doppler:  There was trivial regurgitation.  ------------------------------------------------------------------- Tricuspid valve:  Large vegetation on the lateral leaflet of the TV measuring 2 cm in longest dimension.  Doppler:  There was moderate-severe regurgitation.  ------------------------------------------------------------------- Right atrium:  The atrium was  normal in size.  No evidence of thrombus in the atrial cavity or appendage.  ------------------------------------------------------------------- Pericardium:  The pericardium was normal in appearance. There was no pericardial effusion.  ------------------------------------------------------------------- Post procedure conclusions Ascending Aorta:  - The aorta was normal, not dilated, and non-diseased.  ------------------------------------------------------------------- Measurements   Tricuspid valve                      Value  Tricuspid regurg peak velocity       301   cm/s  Tricuspid peak RV-RA gradient        36    mm Hg  Legend: (L)  and  (H)  mark values outside specified reference range.  ------------------------------------------------------------------- Prepared and Electronically Authenticated by  [name], M.D. 2019-10-07T13:42:40   Impression/Recommendations:  I have personally reviewed the patient's history, laboratory data, chest x-ray, abdominal CT scan, and both transthoracic and transesophageal echocardiograms.  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  There is evidence of septic embolization to both lungs on chest radiograph and abdominal CT scan.  Chest CT scan has not been performed.  Transthoracic and transesophageal echocardiograms document presence of vegetations on the tricuspid valve with moderate to severe tricuspid regurgitation.  There are no signs of significant annular abscess formation nor other mechanical complications of infection.  There are no signs of medically refractory right-sided heart failure.  The patient's fever curve is improving although he is still having occasional low-grade fever.  As is true for all patients with infective endocarditis, guidelines suggest that this patient should be formally evaluated by a  cardiologist to assist with management and long-term follow up.  Dental service consultation should be considered.  CT scan of the chest should be considered.  In contrast to left-sided endocarditis, most patients with isolated right-sided infective endocarditis can be successfully treated with medical therapy alone.  Relative indications for surgical intervention include failure of medical therapy to eradicate the infection, recurrent septic embolization despite medical therapy, and right-sided heart failure refractory to medical therapy.  Less common indications for surgery include mechanical complications of infection, such as annular abscess or fistula formation.  In contrast to left-sided endocarditis, the size of vegetations and the presence of significant tricuspid regurgitation are felt to be much less important.  This patient has no indications for surgical intervention at this time.    Patients with history of IV drug abuse frequently do poorly with surgical intervention,  particularly if their problems with substance abuse are left untreated.  In such patients there is considerable evidence to suggest that any type of surgical intervention which requires implantation of prosthetic materials or devices should be avoided whenever possible because of the potential for disastrous outcomes caused by reinfection.  We can be available to evaluate the patient as needed in the future should the patient develop complications which might warrant surgical intervention.  However, I would not personally consider this patient a candidate for surgical intervention under the current circumstances.  Palliative tricuspid valve excision and debridement could be considered for management of failure of optimal medical therapy if a formal plan were in place for long-term treatment of his underlying disease process involving opioid addiction and IV drug abuse.  His long-term prognosis appears poor.    I had a long  conversation with the patient in his hospital room this afternoon.  We discussed at length the source of his current illness, expectations for his medical treatment, and both short and long-term prognosis.  All questions answered.    Please call if we can be of further assistance.   I spent in excess of 120 minutes during the conduct of this hospital consultation and >50% of this time involved direct face-to-face encounter for counseling and/or coordination of the patient's care.   Salvatore Decent. Cornelius Moras, MD 06/14/2018 10:00am

## 2018-06-15 ENCOUNTER — Encounter (HOSPITAL_COMMUNITY): Payer: Self-pay | Admitting: Cardiovascular Disease

## 2018-06-15 ENCOUNTER — Inpatient Hospital Stay (HOSPITAL_COMMUNITY): Payer: Self-pay

## 2018-06-15 DIAGNOSIS — M546 Pain in thoracic spine: Secondary | ICD-10-CM

## 2018-06-15 DIAGNOSIS — M25511 Pain in right shoulder: Secondary | ICD-10-CM

## 2018-06-15 LAB — CBC
HCT: 25.9 % — ABNORMAL LOW (ref 39.0–52.0)
Hemoglobin: 8.4 g/dL — ABNORMAL LOW (ref 13.0–17.0)
MCH: 28.6 pg (ref 26.0–34.0)
MCHC: 32.4 g/dL (ref 30.0–36.0)
MCV: 88.1 fL (ref 80.0–100.0)
Platelets: 324 10*3/uL (ref 150–400)
RBC: 2.94 MIL/uL — AB (ref 4.22–5.81)
RDW: 13.6 % (ref 11.5–15.5)
WBC: 17.8 10*3/uL — ABNORMAL HIGH (ref 4.0–10.5)
nRBC: 0 % (ref 0.0–0.2)

## 2018-06-15 LAB — CULTURE, BLOOD (ROUTINE X 2)
Culture: NO GROWTH
Culture: NO GROWTH
SPECIAL REQUESTS: ADEQUATE

## 2018-06-15 LAB — COMPREHENSIVE METABOLIC PANEL
ALBUMIN: 1.3 g/dL — AB (ref 3.5–5.0)
ALK PHOS: 136 U/L — AB (ref 38–126)
ALT: 11 U/L (ref 0–44)
ANION GAP: 7 (ref 5–15)
AST: 46 U/L — AB (ref 15–41)
BUN: 29 mg/dL — ABNORMAL HIGH (ref 6–20)
CALCIUM: 7.6 mg/dL — AB (ref 8.9–10.3)
CO2: 29 mmol/L (ref 22–32)
Chloride: 91 mmol/L — ABNORMAL LOW (ref 98–111)
Creatinine, Ser: 1.33 mg/dL — ABNORMAL HIGH (ref 0.61–1.24)
GFR calc Af Amer: >60 mL/min (ref >60)
GFR calc non Af Amer: >60 mL/min (ref >60)
GLUCOSE: 114 mg/dL — AB (ref 70–99)
Potassium: 4.5 mmol/L (ref 3.5–5.1)
SODIUM: 127 mmol/L — AB (ref 135–145)
Total Bilirubin: 0.8 mg/dL (ref 0.3–1.2)
Total Protein: 5.9 g/dL — ABNORMAL LOW (ref 6.5–8.1)

## 2018-06-15 NOTE — Progress Notes (Addendum)
INFECTIOUS DISEASE PROGRESS NOTE  ID: Oscar Burke is a 37 y.o. male with  Principal Problem:   Substance induced mood disorder (HCC) Active Problems:   Acute on chronic respiratory failure with hypoxia (HCC)   Polysubstance abuse (HCC)   Alcohol dependence with unspecified alcohol-induced disorder (HCC)   Abdominal pain   Hepatitis C, chronic (HCC)   Endocarditis of tricuspid valve   Septic embolism (HCC)   Multifocal pneumonia   Tobacco abuse  Subjective: Temp 103 last pm C/o pain in back and R shoulder  Abtx:  Anti-infectives (From admission, onward)   Start     Dose/Rate Route Frequency Ordered Stop   06/09/18 1400  ceFAZolin (ANCEF) IVPB 2g/100 mL premix     2 g 200 mL/hr over 30 Minutes Intravenous Every 8 hours 06/09/18 0920     06/08/18 1800  vancomycin (VANCOCIN) IVPB 750 mg/150 ml premix  Status:  Discontinued     750 mg 150 mL/hr over 60 Minutes Intravenous Every 8 hours 06/08/18 1448 06/09/18 0920   06/08/18 1445  ceFEPIme (MAXIPIME) 1 g in sodium chloride 0.9 % 100 mL IVPB  Status:  Discontinued     1 g 200 mL/hr over 30 Minutes Intravenous Every 8 hours 06/08/18 1436 06/09/18 0920   06/08/18 0845  vancomycin (VANCOCIN) 1,500 mg in sodium chloride 0.9 % 500 mL IVPB     1,500 mg 250 mL/hr over 120 Minutes Intravenous  Once 06/08/18 0828 06/08/18 1222   06/08/18 0830  ceFEPIme (MAXIPIME) 2 g in sodium chloride 0.9 % 100 mL IVPB     2 g 200 mL/hr over 30 Minutes Intravenous  Once 06/08/18 0825 06/08/18 0915   06/08/18 0830  metroNIDAZOLE (FLAGYL) IVPB 500 mg  Status:  Discontinued     500 mg 100 mL/hr over 60 Minutes Intravenous Every 8 hours 06/08/18 0825 06/08/18 2103   06/08/18 0830  vancomycin (VANCOCIN) IVPB 1000 mg/200 mL premix  Status:  Discontinued     1,000 mg 200 mL/hr over 60 Minutes Intravenous  Once 06/08/18 0825 06/08/18 0827      Medications:  Scheduled: . buprenorphine-naloxone  1 tablet Sublingual Daily  . enoxaparin (LOVENOX)  injection  40 mg Subcutaneous Q24H  . mouth rinse  15 mL Mouth Rinse BID  . polyethylene glycol  17 g Oral Daily  . risperiDONE  1 mg Oral QHS  . senna  2 tablet Oral Daily    Objective: Vital signs in last 24 hours: Temp:  [97.7 F (36.5 C)-103.1 F (39.5 C)] 99 F (37.2 C) (10/09 0843) Pulse Rate:  [84-108] 84 (10/09 0500) Resp:  [18-30] 18 (10/09 0500) BP: (104-119)/(62-73) 107/70 (10/09 0500) SpO2:  [91 %-99 %] 99 % (10/09 0500) Weight:  [79.5 kg] 79.5 kg (10/09 0500)   General appearance: alert, cooperative and no distress Resp: clear to auscultation bilaterally Cardio: regular rate and rhythm GI: normal findings: bowel sounds normal and soft, non-tender Extremities: R shoulder tender, no effusion. Spine tender from upper thoracic down.   Lab Results Recent Labs    06/13/18 0328 06/13/18 1935 06/15/18 0423  WBC 18.9*  --  17.8*  HGB 8.2*  --  8.4*  HCT 24.7*  --  25.9*  NA 127* 126* 127*  K 4.1 4.3 4.5  CL 90* 89* 91*  CO2 29 28 29   BUN 20 22* 29*  CREATININE 1.03 1.20 1.33*   Liver Panel Recent Labs    06/15/18 0423  PROT 5.9*  ALBUMIN 1.3*  AST  46*  ALT 11  ALKPHOS 136*  BILITOT 0.8   Sedimentation Rate No results for input(s): ESRSEDRATE in the last 72 hours. C-Reactive Protein No results for input(s): CRP in the last 72 hours.  Microbiology: Recent Results (from the past 240 hour(s))  Blood Culture (routine x 2)     Status: Abnormal   Collection Time: 06/08/18  7:47 AM  Result Value Ref Range Status   Specimen Description   Final    BLOOD RIGHT ARM Performed at [facility], [address] [place], [place] [address]    Special Requests   Final    BOTTLES DRAWN AEROBIC AND ANAEROBIC Blood Culture results may not be optimal due to an excessive volume of blood received in culture bottles Performed at [facility], [address], [place], [place] [address]    Culture  Setup Time   Final    GRAM  POSITIVE COCCI IN BOTH AEROBIC AND ANAEROBIC BOTTLES CRITICAL RESULT CALLED TO, READ BACK BY AND VERIFIED WITH: PHARMD N GLOGOVAC [phone] MLM    Culture (A)  Final    STAPHYLOCOCCUS AUREUS BACILLUS SPECIES Standardized susceptibility testing for this organism is not available. CRITICAL RESULT CALLED TO, READ BACK BY AND VERIFIED WITH: PHARMD MEREDITH R [phone] FCP Performed at [facility], [address], [place], [place] [address]    Report Status 06/11/2018 FINAL  Final   Organism ID, Bacteria STAPHYLOCOCCUS AUREUS  Final      Susceptibility   Staphylococcus aureus - MIC*    CIPROFLOXACIN <=0.5 SENSITIVE Sensitive     ERYTHROMYCIN >=8 RESISTANT Resistant     GENTAMICIN <=0.5 SENSITIVE Sensitive     OXACILLIN 0.5 SENSITIVE Sensitive     TETRACYCLINE <=1 SENSITIVE Sensitive     VANCOMYCIN 1 SENSITIVE Sensitive     TRIMETH/SULFA <=10 SENSITIVE Sensitive     CLINDAMYCIN <=0.25 SENSITIVE Sensitive     RIFAMPIN <=0.5 SENSITIVE Sensitive     Inducible Clindamycin NEGATIVE Sensitive     * STAPHYLOCOCCUS AUREUS  Blood Culture ID Panel (Reflexed)     Status: Abnormal   Collection Time: 06/08/18  7:47 AM  Result Value Ref Range Status   Enterococcus species NOT DETECTED NOT DETECTED Final   Listeria monocytogenes NOT DETECTED NOT DETECTED Final   Staphylococcus species DETECTED (A) NOT DETECTED Final    Comment: CRITICAL RESULT CALLED TO, READ BACK BY AND VERIFIED WITH: PHARMD N GLOGOVAC [phone] MLM    Staphylococcus aureus (BCID) DETECTED (A) NOT DETECTED Final    Comment: Methicillin (oxacillin) susceptible Staphylococcus aureus (MSSA). Preferred therapy is anti staphylococcal beta lactam antibiotic (Cefazolin or Nafcillin), unless clinically contraindicated. CRITICAL RESULT CALLED TO, READ BACK BY AND VERIFIED WITH: PHARMD N GLOGOVAC [phone] MLM    Methicillin resistance NOT DETECTED NOT DETECTED Final   Streptococcus species NOT DETECTED NOT DETECTED Final     Streptococcus agalactiae NOT DETECTED NOT DETECTED Final   Streptococcus pneumoniae NOT DETECTED NOT DETECTED Final   Streptococcus pyogenes NOT DETECTED NOT DETECTED Final   Acinetobacter baumannii NOT DETECTED NOT DETECTED Final   Enterobacteriaceae species NOT DETECTED NOT DETECTED Final   Enterobacter cloacae complex NOT DETECTED NOT DETECTED Final   Escherichia coli NOT DETECTED NOT DETECTED Final   Klebsiella oxytoca NOT DETECTED NOT DETECTED Final   Klebsiella pneumoniae NOT DETECTED NOT DETECTED Final   Proteus species NOT DETECTED NOT DETECTED Final   Serratia marcescens NOT DETECTED NOT DETECTED Final   Haemophilus influenzae  NOT DETECTED NOT DETECTED Final   Neisseria meningitidis NOT DETECTED NOT DETECTED Final   Pseudomonas aeruginosa NOT DETECTED NOT DETECTED Final   Candida albicans NOT DETECTED NOT DETECTED Final   Candida glabrata NOT DETECTED NOT DETECTED Final   Candida krusei NOT DETECTED NOT DETECTED Final   Candida parapsilosis NOT DETECTED NOT DETECTED Final   Candida tropicalis NOT DETECTED NOT DETECTED Final    Comment: Performed at [facility], [address], [place], [place] [address]  Blood Culture (routine x 2)     Status: Abnormal   Collection Time: 06/08/18  7:55 AM  Result Value Ref Range Status   Specimen Description   Final    BLOOD RIGHT WRIST Performed at [facility], [address], [place], [place] [address]    Special Requests   Final    BOTTLES DRAWN AEROBIC AND ANAEROBIC Blood Culture results may not be optimal due to an excessive volume of blood received in culture bottles Performed at [facility], [address] [place], [place] [address]    Culture  Setup Time   Final    GRAM POSITIVE COCCI IN BOTH AEROBIC AND ANAEROBIC BOTTLES CRITICAL VALUE NOTED.  VALUE IS CONSISTENT WITH PREVIOUSLY REPORTED AND CALLED VALUE.    Culture (A)  Final    STAPHYLOCOCCUS AUREUS SUSCEPTIBILITIES PERFORMED  ON PREVIOUS CULTURE WITHIN THE LAST 5 DAYS. Performed at [facility], [address] [place], [place] [address]    Report Status 06/11/2018 FINAL  Final  Culture, sputum-assessment     Status: None   Collection Time: 06/08/18  3:06 PM  Result Value Ref Range Status   Specimen Description EXPECTORATED SPUTUM  Final   Special Requests NONE  Final   Sputum evaluation   Final    THIS SPECIMEN IS ACCEPTABLE FOR SPUTUM CULTURE Performed at [facility], [address] [place], [place] [address]    Report Status 06/08/2018 FINAL  Final  Culture, respiratory     Status: None   Collection Time: 06/08/18  3:06 PM  Result Value Ref Range Status   Specimen Description   Final    EXPECTORATED SPUTUM Performed at [facility], [address], [place], [place] [address]    Special Requests   Final    NONE Reflexed from [id number] Performed at [facility], [address], [place], [place] [address]    Gram Stain   Final    ABUNDANT WBC PRESENT,BOTH PMN AND MONONUCLEAR FEW GRAM POSITIVE COCCI FEW GRAM NEGATIVE RODS FEW SQUAMOUS EPITHELIAL CELLS PRESENT Performed at [facility], [address], [place], [place] [address]    Culture FEW STAPHYLOCOCCUS AUREUS  Final   Report Status 06/11/2018 FINAL  Final   Organism ID, Bacteria STAPHYLOCOCCUS AUREUS  Final      Susceptibility   Staphylococcus aureus - MIC*    CIPROFLOXACIN <=0.5 SENSITIVE Sensitive     ERYTHROMYCIN >=8 RESISTANT Resistant     GENTAMICIN <=0.5 SENSITIVE Sensitive     OXACILLIN 0.5 SENSITIVE Sensitive     TETRACYCLINE <=1 SENSITIVE Sensitive     VANCOMYCIN 1 SENSITIVE Sensitive     TRIMETH/SULFA <=10 SENSITIVE Sensitive     CLINDAMYCIN <=0.25 SENSITIVE Sensitive     RIFAMPIN <=0.5 SENSITIVE Sensitive     Inducible Clindamycin NEGATIVE Sensitive     * FEW STAPHYLOCOCCUS AUREUS  MRSA PCR Screening     Status: None   Collection Time: 06/08/18  3:22 PM    Result Value Ref Range Status   MRSA by PCR NEGATIVE NEGATIVE Final    Comment:        The GeneXpert MRSA Assay (FDA approved for NASAL specimens only), is one component of a comprehensive MRSA colonization surveillance program. It is not intended to diagnose MRSA infection nor to guide or monitor treatment for MRSA infections. Performed at [facility], [address], [place], [place] [address]   Culture, blood (routine x 2)     Status: None   Collection Time: 06/10/18  3:14 AM  Result Value Ref Range Status   Specimen Description   Final    BLOOD LEFT ARM Performed at [facility], [address] [place], [place] [address]    Special Requests   Final    BOTTLES DRAWN AEROBIC ONLY Blood Culture adequate volume Performed at [facility], [address], [place], [place] [address]    Culture   Final    NO GROWTH 5 DAYS Performed at [facility], [address], [place], [place] [address]    Report Status 06/15/2018 FINAL  Final  Culture, blood (routine x 2)     Status: None   Collection Time: 06/10/18  3:14 AM  Result Value Ref Range Status   Specimen Description   Final    BLOOD LEFT HAND Performed at [facility], [address], [place], [place] [address]    Special Requests   Final    BOTTLES DRAWN AEROBIC ONLY Blood Culture results may not be optimal due to an inadequate volume of blood received in culture bottles Performed at [facility], [address] [place], [place] [address]    Culture   Final    NO GROWTH 5 DAYS Performed at [facility], [address], [place], [place] [address]    Report Status 06/15/2018 FINAL  Final  MRSA PCR Screening     Status: None   Collection Time: 06/13/18  7:55 PM  Result Value Ref Range Status   MRSA by PCR NEGATIVE NEGATIVE Final    Comment:        The GeneXpert MRSA Assay (FDA approved for NASAL  specimens only), is one component of a comprehensive MRSA colonization surveillance program. It is not intended to diagnose MRSA infection nor to guide or monitor treatment for MRSA infections. Performed at [facility], [address] [place], [place] [address]     Studies/Results: No results found.   Assessment/Plan: TV IE with TR (severe) MSSA bacteremia Hep C Opioid use  Total days of antibiotics: 7 ancef  Repeat BCx 10-4 are negative.  Will order MRI of spine for his back pain. Will order MRI of his shoulder  Fever persists, repeat BCx sent this AM.  WBC remains elevated, Cr increasing.  Will change ancef to dapto if MRI negative.  Denies sx of w/d.          Johny Sax MD, FACP Infectious Diseases (pager) [phone] [email or web address] 06/15/2018, 1:33 PM  LOS: 7 days

## 2018-06-15 NOTE — Progress Notes (Signed)
Pt girlfriend at bedside. Asked her if pt's mental status was at baseline. She stated yes, that he was acting more babyish, but this is how he has been since prior to hospital, since being sick. Cont to monitor. Emelda Brothers RN

## 2018-06-15 NOTE — Consult Note (Signed)
DENTAL CONSULTATION  Date of Consultation:  06/15/2018 Patient Name:   Monta Police Date of Birth:   May 03, 1981 Medical Record Number: [id number]  VITALS: BP 107/70 (BP Location: Left Arm)   Pulse 84   Temp 99 F (37.2 C) Comment: At pt request.  Resp 18   Ht 5\' 11"  (1.803 m)   Wt 79.5 kg   SpO2 99%   BMI 24.44 kg/m   CHIEF COMPLAINT: Patient referred by [name] for dental consultation.  HPI: Jenesis Suchy is a 37 year old male recently diagnosed with tricuspid valve endocarditis. Patient is currently on IV antibiotic therapy. Dental consultation requested to evaluate poor dentition.  The patient currently denies acute toothaches, swellings, or abscesses. Patient indicates an upper left premolar had been bothering him several months ago. Patient points to tooth #12 was the offending tooth. Patient indicates that he has not seen a dentist " for a long time".  Patient indicates that the last dentist was seen when he was in the prison system.  Patient denies having partial dentures. Patient does have a history of mandible fracture that was repaired with several plates by patient report. This involves the lower right mandible and the anterior left mandible.  This treatment was provided while he was in [place], [place] in the 1990s.The patient denies having dental phobia.   PROBLEM LIST: Patient Active Problem List   Diagnosis Date Noted  . Substance induced mood disorder (HCC)   . Abdominal pain 06/08/2018  . Hepatitis C, chronic (HCC) 06/08/2018  . 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 Septic embolism (HCC) 06/08/2018  . Multifocal pneumonia 06/08/2018  . Tobacco abuse 06/08/2018  . Polysubstance abuse (HCC) 04/07/2017  . Leukocytosis 04/07/2017  . Alcohol dependence with unspecified alcohol-induced disorder (HCC)   . Opiate overdose (HCC) 12/13/2013  . Encephalopathy acute 12/13/2013  . Acute on chronic respiratory failure with hypoxia (HCC) 12/13/2013     PMH: Past Medical History:  Diagnosis Date  . Bipolar 1 disorder (HCC)   . Chronic back pain   . Hepatitis C   . Polysubstance abuse (HCC)   . Smoker     PSH: Past Surgical History:  Procedure Laterality Date  . ARM AMPUTATION    . MANDIBLE FRACTURE SURGERY      ALLERGIES: No Known Allergies  MEDICATIONS: Current Facility-Administered Medications  Medication Dose Route Frequency Provider Last Rate Last Dose  . 0.9 %  sodium chloride infusion   Intravenous PRN Tyrone Nine, MD   Stopped at 06/14/18 1712  . acetaminophen (TYLENOL) tablet 650 mg  650 mg Oral Q6H PRN Tyrone Nine, MD   650 mg at 06/14/18 2138  . alum & mag hydroxide-simeth (MAALOX/MYLANTA) 200-200-20 MG/5ML suspension 30 mL  30 mL Oral Q4H PRN Tyrone Nine, MD      . bisacodyl (DULCOLAX) suppository 10 mg  10 mg Rectal Daily PRN Tyrone Nine, MD      . buprenorphine-naloxone (SUBOXONE) 8-2 mg per SL tablet 1 tablet  1 tablet Sublingual Daily Tyrone Nine, MD   1 tablet at 06/15/18 0851  . ceFAZolin (ANCEF) IVPB 2g/100 mL premix  2 g Intravenous Q8H Tyrone Nine, MD   Stopped at 06/15/18 1130  . clonazePAM (KLONOPIN) tablet 0.5 mg  0.5 mg Oral TID PRN Tyrone Nine, MD   0.5 mg at 06/15/18 0852  . cyclobenzaprine (FLEXERIL) tablet 5 mg  5 mg Oral TID PRN Tyrone Nine, MD   5 mg at 06/15/18 1236  .  enoxaparin (LOVENOX) injection 40 mg  40 mg Subcutaneous Q24H Tyrone Nine, MD   40 mg at 06/13/18 1728  . guaiFENesin-dextromethorphan (ROBITUSSIN DM) 100-10 MG/5ML syrup 5 mL  5 mL Oral Q4H PRN Tyrone Nine, MD   5 mL at 06/14/18 1015  . ipratropium-albuterol (DUONEB) 0.5-2.5 (3) MG/3ML nebulizer solution 3 mL  3 mL Nebulization Q4H PRN Tyrone Nine, MD      . MEDLINE mouth rinse  15 mL Mouth Rinse BID Tyrone Nine, MD   15 mL at 06/15/18 1042  . MUSCLE RUB CREA   Topical PRN Tyrone Nine, MD   1 application at 06/11/18 [phone]  . ondansetron (ZOFRAN) tablet 4 mg  4 mg Oral Q6H PRN Tyrone Nine, MD       Or   . ondansetron [facility]) injection 4 mg  4 mg Intravenous Q6H PRN Tyrone Nine, MD      . polyethylene glycol (MIRALAX / GLYCOLAX) packet 17 g  17 g Oral Daily Tyrone Nine, MD   17 g at 06/15/18 1042  . risperiDONE (RISPERDAL) tablet 1 mg  1 mg Oral QHS Tyrone Nine, MD   1 mg at 06/14/18 2138  . senna (SENOKOT) tablet 17.2 mg  2 tablet Oral Daily Tyrone Nine, MD   17.2 mg at 06/15/18 0851  . traMADol (ULTRAM) tablet 100 mg  100 mg Oral Q6H PRN Tyrone Nine, MD   100 mg at 06/15/18 0851    LABS: Lab Results  Component Value Date   WBC 17.8 (H) 06/15/2018   HGB 8.4 (L) 06/15/2018   HCT 25.9 (L) 06/15/2018   MCV 88.1 06/15/2018   PLT 324 06/15/2018      Component Value Date/Time   NA 127 (L) 06/15/2018 0423   K 4.5 06/15/2018 0423   CL 91 (L) 06/15/2018 0423   CO2 29 06/15/2018 0423   GLUCOSE 114 (H) 06/15/2018 0423   BUN 29 (H) 06/15/2018 0423   CREATININE 1.33 (H) 06/15/2018 0423   CALCIUM 7.6 (L) 06/15/2018 0423   GFRNONAA >60 06/15/2018 0423   GFRAA >60 06/15/2018 0423   No results found for: INR, PROTIME No results found for: PTT  SOCIAL HISTORY: Social History   Socioeconomic History  . Marital status: Single    Spouse name: Not on file  . Number of children: Not on file  . Years of education: Not on file  . Highest education level: Not on file  Occupational History  . Not on file  Social Needs  . Financial resource strain: Not on file  . Food insecurity:    Worry: Not on file    Inability: Not on file  . Transportation needs:    Medical: Not on file    Non-medical: Not on file  Tobacco Use  . Smoking status: Current Every Day Smoker    Packs/day: 1.00    Types: Cigarettes  . Smokeless tobacco: Never Used  Substance and Sexual Activity  . Alcohol use: No    Frequency: Never  . Drug use: Yes    Types: Cocaine, IV    Comment: Heroin, ice, crack, and morphine pills  . Sexual activity: Not on file  Lifestyle  . Physical activity:    Days per  week: Not on file    Minutes per session: Not on file  . Stress: Not on file  Relationships  . Social connections:    Talks on phone: Not on file  Gets together: Not on file    Attends religious service: Not on file    Active member of club or organization: Not on file    Attends meetings of clubs or organizations: Not on file    Relationship status: Not on file  . Intimate partner violence:    Fear of current or ex partner: Not on file    Emotionally abused: Not on file    Physically abused: Not on file    Forced sexual activity: Not on file  Other Topics Concern  . Not on file  Social History Narrative  . Not on file    FAMILY HISTORY: Family History  Problem Relation Age of Onset  . Cancer Mother     REVIEW OF SYSTEMS: Reviewed with the patient as per History of present illness. Psych: Patient denies having dental phobia.  DENTAL HISTORY: CHIEF COMPLAINT: Patient referred by [name] for dental consultation.  HPI: Branndon Tuite is a 37 year old male recently diagnosed with tricuspid valve endocarditis. Patient is currently on IV antibiotic therapy. Dental consultation requested to evaluate poor dentition.  The patient currently denies acute toothaches, swellings, or abscesses. Patient indicates an upper left premolar had been bothering him several months ago. Patient points to tooth #12 was the offending tooth. Patient indicates that he has not seen a dentist " for a long time".  Patient indicates that the last dentist was seen when he was in the prison system.  Patient denies having partial dentures. Patient does have a history of mandible fracture that was repaired with several plates by patient report. This involves the lower right mandible and the anterior left mandible.  This treatment was provided while he was in [place], [place] in the 1990s.The patient denies having dental phobia.  DENTAL EXAMINATION: GENERAL:  The patient is a well-developed, slightly  built male in no acute distress. HEAD AND NECK:  There is no palpable neck lymphadenopathy. The patient denies acute TMJ symptoms. INTRAORAL EXAM:  There is no evidence of oral abscess formation within the mouth. Patient has normal saliva. DENTITION:  Patient with multiple missing teeth numbers 1, 2, 8, 16, 17-19, 30, and 32. PERIODONTAL:  Patient has chronic periodontal disease with plaque and calculus accumulations, generalized gingival recession, and moderate bone loss. Radiographic calculus is noted. DENTAL CARIES/SUBOPTIMAL RESTORATIONS:  Patient has rampant dental caries affecting most of remaining dentition. I would need a full series of dental radiographs to identify the extent of the dental caries. ENDODONTIC:  Patient currently denies acute pulpitis symptoms. Patient does have a history of some toothache problems involving the upper left premolar #12.  There are periapical radiolucencies associated with tooth numbers 23, 24 and possibly #12. CROWN AND BRIDGE: There are no apparent crown restorations. PROSTHODONTIC: The patient denies having partial dentures. OCCLUSION:  Patient has a poor occlusal scheme secondary to multiple missing teeth, supra-eruption and drifting of the unopposed teeth into the edentulous areas, and lack of replacement of missing teeth with dental prostheses.  RADIOGRAPHIC INTERPRETATION: An orthopantogram was taken on 06/15/2018. This is suboptimal. There are multiple missing teeth. There are rampant dental caries noted. There is moderate bone loss noted.  There is periapical pathology and radiolucency associated with tooth numbers 23 and 24 and possibly #12. Patient has plating and screws associated with a history of previous mandible fracture involving the right posterior mandible and the left anterior mandible.  ASSESSMENTS: 1. Tricuspid valve endocarditis 2. History of acute pulpitis symptoms-none today. 3. Chronic apical periodontitis 4. Rampant dental  caries 5. Chronic periodontitis with bone loss 6. Accretions 7. Gingival recession 8. Tooth mobility 9. Multiple missing teeth 10. History of mandible fracture with plating and screws associated with the right posterior mandible and left anterior mandible. 11. Poor occlusal scheme and malocclusion 12. History of oral neglect  PLAN/RECOMMENDATIONS: 1. I discussed the risks, benefits, and complications of various treatment options with the patient in relationship to his medical and dental conditions and future risk for endocarditis from dental etiology.  Ideally the patient needs to follow-up as an outpatient for a full series of dental radiographs and discussion of the comprehensive dental treatment needs. This may include total or subtotal extractions with alveoloplasty. Due to the history of previous mandible fracture and plating systems involving the lower right posterior mandible and left anterior mandible, the patient ideally would follow up for an oral surgeon for the necessary extraction procedures.  The patient currently does not wish to proceed with dental treatment at this time. Patient will contact and general dentist of his choice for dental radiographs and discussion of treatment needs once he is discharged.  The patient is to contact dental medicine if he is experiencing any acute problems during the remainder of this admission.   2. Discussion of findings with medical team and coordination of future medical and dental care as needed.    Charlynne Pander, DDS

## 2018-06-15 NOTE — Progress Notes (Addendum)
Dear Dr. Purohit:This patient has been identified as a candidate for PICC for the following reason (s): IV therapy over 48 hours and at least 6 weeks treatment with IV antibiotics. If you agree, please write an order for the indicated device. For any questions contact the Vascular Access Team at [phone]; if no answer, please leave a message.  Due to his current diagnosis, if you feel a PICC is not in his best interest at this time, please write an order for a midline which can be used for antibiotics for up to 6 days with Vancomycin and up to 29 days with other fluids and antibiotics, to help with vein preservation, reduce infection risk, and improve patient care. Please note a midline might not give blood return after 48 - 72 hours. After blood cultures are clear, a PICC can be reconsidered.  Thank you for supporting the early vascular access assessment program.

## 2018-06-15 NOTE — Progress Notes (Signed)
PROGRESS NOTE    Oscar Burke  WGN:[id number] DOB: April 17, 1981 DOA: 06/08/2018 PCP: Patient, No Pcp Per    Brief Narrative:  37 year old with past medical history relevant for IV heroin abuse, chronic hepatitis C who presented to the emergency department with fevers and body aches as well as whitish sputum and tachypnea and acute hypoxic respiratory failure and found to have septic pulmonary emboli and MSSA tricuspid valve endocarditis.   Assessment & Plan:   Principal Problem:   Substance induced mood disorder (HCC) Active Problems:   Acute on chronic respiratory failure with hypoxia (HCC)   Polysubstance abuse (HCC)   Alcohol dependence with unspecified alcohol-induced disorder (HCC)   Abdominal pain   Hepatitis C, chronic (HCC)   Endocarditis of tricuspid valve   Septic embolism (HCC)   Multifocal pneumonia   Tobacco abuse   #) MSSA tricuspid valve endocarditis complicated by septic emboli: Patient continues to have a fever daily to 100.3.  He complained somewhat of back and shoulder pain. -Infectious diseases following, appreciate recommendations -MRI of spine and shoulder ordered per ID - Blood cultures from 06/08/2018 growing MSSA -Blood cultures from 06/10/2018 no growth to date -Repeat blood cultures ordered today 06/15/2018 -Continue IV cefazolin started 06/2018, per infectious disease if this fever is breakthrough than they would consider switching to daptomycin  #) Polysubstance abuse: -Continue Suboxone 1 tablet daily  #) Pain/psych: -Continue PRN cycle Benzapril clonazepam  #) Acute hypoxic respiratory failure: Likely secondary to septic emboli in the lungs.  This is resolved now  #) Chronic hep C: -Outpatient treatment  Fluids: Tolerating p.o. Electrodes: Monitor and supplement Nutrition: Regular diet  Prophylaxis: Enoxaparin  Disposition: Pending completion of IV antibiotics  Full code   Consultants:   Infectious disease  Cardio thoracic  surgery  Cardiology  Procedures:  06/08/2018 echo:- Left ventricle: The cavity size was normal. Wall thickness was   normal. Systolic function was normal. The estimated ejection   fraction was in the range of 55% to 60%. Wall motion was normal;   there were no regional wall motion abnormalities. Left   ventricular diastolic function parameters were normal. - Ventricular septum: Very mildly D-shaped interventricular septum   is concerning for RV pressure/volume overload. - Aortic valve: There was no stenosis. - Mitral valve: There was no regurgitation. - Right ventricle: The cavity size was mildly dilated. Systolic   function was normal. - Right atrium: The atrium was mildly dilated. - Tricuspid valve: There was mild-moderate regurgitation. Suspected   tricuspid vegetation. Peak RV-RA gradient (S): 25 mm Hg. - Pulmonary arteries: PA peak pressure: 33 mm Hg (S). - Systemic veins: IVC measured 2.3 cm with > 50% respirophasic   variation, suggesting RA pressure 8 mmHg. - Pericardium, extracardiac: Small circumferential pericardial   effusion.  Impressions:  - Normal LV size and systolic function, EF 55-60%. Normal diastolic   function. Mildly D-shaped interventricular septum suggesting a   degree of RV pressure/volume overload. Mildly dilated RV with   normal systolic function. Mild to moderate TR. There does appear    to be a tricuspid valve vegetation. Need TEE to confirm.   06/13/2018 TEE:- Left ventricle: Systolic function was normal. The estimated   ejection fraction was in the range of 60% to 65%. - Aortic valve: No evidence of vegetation. - Mitral valve: No evidence of vegetation. - Left atrium: No evidence of thrombus in the atrial cavity or   appendage. - Right atrium: No evidence of thrombus in the atrial cavity or  appendage. - Atrial septum: No defect or patent foramen ovale was identified. - Tricuspid valve: Large vegetation on the lateral leaflet of the   TV  measuring 2 cm in longest dimension. There was moderate-severe   regurgitation. - Pulmonic valve: No evidence of vegetation.  Antimicrobials:   IV cefazolin started 06/08/2018   Subjective: Patient reports that he is only in pain but he understands that he cannot receive excessive doses of pain medications.  He denies any nausea, vomiting, diarrhea, cough, congestion, rhinorrhea.  He reports that he does have some back pain as well as some shoulder pain but no new symptoms.  Objective: Vitals:   06/14/18 2131 06/14/18 2304 06/15/18 0500 06/15/18 0843  BP: 119/73  107/70   Pulse: (!) 108  84   Resp: 18  18   Temp: (!) 103.1 F (39.5 C) 99.4 F (37.4 C) 97.7 F (36.5 C) 99 F (37.2 C)  TempSrc: Oral Oral Oral   SpO2: 91%  99%   Weight:   79.5 kg   Height:        Intake/Output Summary (Last 24 hours) at 06/15/2018 1458 Last data filed at 06/15/2018 1010 Gross per 24 hour  Intake 1046.97 ml  Output 950 ml  Net 96.97 ml   Filed Weights   06/13/18 1941 06/14/18 0500 06/15/18 0500  Weight: 78.2 kg 79.3 kg 79.5 kg    Examination:  General exam: Appears calm and comfortable  Respiratory system: Clear to auscultation. Respiratory effort normal. Cardiovascular system: Regular rate and rhythm, 2 out of 6 systolic murmur heard best at left upper sternal border Gastrointestinal system: Abdomen is nondistended, soft and nontender. No organomegaly or masses felt. Normal bowel sounds heard. Central nervous system: Alert and oriented. No focal neurological deficits. Extremities: No lower extremity edema.  No midline tenderness over back Skin: Healed injection site Psychiatry: Judgement and insight appear poor. Mood & affect appropriate.     Data Reviewed: I have personally reviewed following labs and imaging studies  CBC: Recent Labs  Lab 06/10/18 0314 06/11/18 0326 06/12/18 0343 06/13/18 0328 06/15/18 0423  WBC 19.4* 18.5* 17.5* 18.9* 17.8*  HGB 9.7* 9.6* 8.9* 8.2* 8.4*    HCT 29.1* 28.4* 26.4* 24.7* 25.9*  MCV 86.4 86.6 87.4 87.6 88.1  PLT 108* 157 194 258 324   Basic Metabolic Panel: Recent Labs  Lab 06/11/18 0326 06/12/18 0343 06/13/18 0328 06/13/18 1935 06/15/18 0423  NA 128* 126* 127* 126* 127*  K 3.7 3.6 4.1 4.3 4.5  CL 92* 90* 90* 89* 91*  CO2 27 28 29 28 29   GLUCOSE 88 116* 110* 99 114*  BUN 21* 22* 20 22* 29*  CREATININE 1.01 1.08 1.03 1.20 1.33*  CALCIUM 7.2* 7.5* 7.3* 7.3* 7.6*   GFR: Estimated Creatinine Clearance: 79.4 mL/min (A) (by C-G formula based on SCr of 1.33 mg/dL (H)). Liver Function Tests: Recent Labs  Lab 06/10/18 0314 06/12/18 0951 06/15/18 0423  AST 72*  --  46*  ALT 36  --  11  ALKPHOS 105  --  136*  BILITOT 1.3* 1.6* 0.8  PROT 4.6*  --  5.9*  ALBUMIN 1.4*  --  1.3*   No results for input(s): LIPASE, AMYLASE in the last 168 hours. No results for input(s): AMMONIA in the last 168 hours. Coagulation Profile: No results for input(s): INR, PROTIME in the last 168 hours. Cardiac Enzymes: No results for input(s): CKTOTAL, CKMB, CKMBINDEX, TROPONINI in the last 168 hours. BNP (last 3 results) No results for  input(s): PROBNP in the last 8760 hours. HbA1C: No results for input(s): HGBA1C in the last 72 hours. CBG: No results for input(s): GLUCAP in the last 168 hours. Lipid Profile: No results for input(s): CHOL, HDL, LDLCALC, TRIG, CHOLHDL, LDLDIRECT in the last 72 hours. Thyroid Function Tests: No results for input(s): TSH, T4TOTAL, FREET4, T3FREE, THYROIDAB in the last 72 hours. Anemia Panel: No results for input(s): VITAMINB12, FOLATE, FERRITIN, TIBC, IRON, RETICCTPCT in the last 72 hours. Sepsis Labs: No results for input(s): PROCALCITON, LATICACIDVEN in the last 168 hours.  Recent Results (from the past 240 hour(s))  Blood Culture (routine x 2)     Status: Abnormal   Collection Time: 06/08/18  7:47 AM  Result Value Ref Range Status   Specimen Description   Final    BLOOD RIGHT ARM Performed at  [facility], [address], [place], [place] [address]    Special Requests   Final    BOTTLES DRAWN AEROBIC AND ANAEROBIC Blood Culture results may not be optimal due to an excessive volume of blood received in culture bottles Performed at [facility], [address] [place], [place] [address]    Culture  Setup Time   Final    GRAM POSITIVE COCCI IN BOTH AEROBIC AND ANAEROBIC BOTTLES CRITICAL RESULT CALLED TO, READ BACK BY AND VERIFIED WITH: PHARMD N GLOGOVAC [phone] MLM    Culture (A)  Final    STAPHYLOCOCCUS AUREUS BACILLUS SPECIES Standardized susceptibility testing for this organism is not available. CRITICAL RESULT CALLED TO, READ BACK BY AND VERIFIED WITH: PHARMD MEREDITH R [id number] FCP Performed at [facility], [address] [place], [place] [address]    Report Status 06/11/2018 FINAL  Final   Organism ID, Bacteria STAPHYLOCOCCUS AUREUS  Final      Susceptibility   Staphylococcus aureus - MIC*    CIPROFLOXACIN <=0.5 SENSITIVE Sensitive     ERYTHROMYCIN >=8 RESISTANT Resistant     GENTAMICIN <=0.5 SENSITIVE Sensitive     OXACILLIN 0.5 SENSITIVE Sensitive     TETRACYCLINE <=1 SENSITIVE Sensitive     VANCOMYCIN 1 SENSITIVE Sensitive     TRIMETH/SULFA <=10 SENSITIVE Sensitive     CLINDAMYCIN <=0.25 SENSITIVE Sensitive     RIFAMPIN <=0.5 SENSITIVE Sensitive     Inducible Clindamycin NEGATIVE Sensitive     * STAPHYLOCOCCUS AUREUS  Blood Culture ID Panel (Reflexed)     Status: Abnormal   Collection Time: 06/08/18  7:47 AM  Result Value Ref Range Status   Enterococcus species NOT DETECTED NOT DETECTED Final   Listeria monocytogenes NOT DETECTED NOT DETECTED Final   Staphylococcus species DETECTED (A) NOT DETECTED Final    Comment: CRITICAL RESULT CALLED TO, READ BACK BY AND VERIFIED WITH: PHARMD N GLOGOVAC [phone] MLM    Staphylococcus aureus (BCID) DETECTED (A) NOT DETECTED Final    Comment: Methicillin  (oxacillin) susceptible Staphylococcus aureus (MSSA). Preferred therapy is anti staphylococcal beta lactam antibiotic (Cefazolin or Nafcillin), unless clinically contraindicated. CRITICAL RESULT CALLED TO, READ BACK BY AND VERIFIED WITH: PHARMD N GLOGOVAC [phone] MLM    Methicillin resistance NOT DETECTED NOT DETECTED Final   Streptococcus species NOT DETECTED NOT DETECTED Final   Streptococcus agalactiae NOT DETECTED NOT DETECTED Final   Streptococcus pneumoniae NOT DETECTED NOT DETECTED Final   Streptococcus pyogenes NOT DETECTED NOT DETECTED Final   Acinetobacter baumannii NOT DETECTED NOT DETECTED Final   Enterobacteriaceae species NOT DETECTED NOT DETECTED Final   Enterobacter cloacae  complex NOT DETECTED NOT DETECTED Final   Escherichia coli NOT DETECTED NOT DETECTED Final   Klebsiella oxytoca NOT DETECTED NOT DETECTED Final   Klebsiella pneumoniae NOT DETECTED NOT DETECTED Final   Proteus species NOT DETECTED NOT DETECTED Final   Serratia marcescens NOT DETECTED NOT DETECTED Final   Haemophilus influenzae NOT DETECTED NOT DETECTED Final   Neisseria meningitidis NOT DETECTED NOT DETECTED Final   Pseudomonas aeruginosa NOT DETECTED NOT DETECTED Final   Candida albicans NOT DETECTED NOT DETECTED Final   Candida glabrata NOT DETECTED NOT DETECTED Final   Candida krusei NOT DETECTED NOT DETECTED Final   Candida parapsilosis NOT DETECTED NOT DETECTED Final   Candida tropicalis NOT DETECTED NOT DETECTED Final    Comment: Performed at [facility], [address] [place], [place] [address]  Blood Culture (routine x 2)     Status: Abnormal   Collection Time: 06/08/18  7:55 AM  Result Value Ref Range Status   Specimen Description   Final    BLOOD RIGHT WRIST Performed at [facility], [address] [place], [place] [address]    Special Requests   Final    BOTTLES DRAWN AEROBIC AND ANAEROBIC Blood Culture results may not be optimal due to an excessive  volume of blood received in culture bottles Performed at [facility], [address] [place], [place] [address]    Culture  Setup Time   Final    GRAM POSITIVE COCCI IN BOTH AEROBIC AND ANAEROBIC BOTTLES CRITICAL VALUE NOTED.  VALUE IS CONSISTENT WITH PREVIOUSLY REPORTED AND CALLED VALUE.    Culture (A)  Final    STAPHYLOCOCCUS AUREUS SUSCEPTIBILITIES PERFORMED ON PREVIOUS CULTURE WITHIN THE LAST 5 DAYS. Performed at [facility], [address] [place], [place] [address]    Report Status 06/11/2018 FINAL  Final  Culture, sputum-assessment     Status: None   Collection Time: 06/08/18  3:06 PM  Result Value Ref Range Status   Specimen Description EXPECTORATED SPUTUM  Final   Special Requests NONE  Final   Sputum evaluation   Final    THIS SPECIMEN IS ACCEPTABLE FOR SPUTUM CULTURE Performed at [facility], [address], [place], [place] [address]    Report Status 06/08/2018 FINAL  Final  Culture, respiratory     Status: None   Collection Time: 06/08/18  3:06 PM  Result Value Ref Range Status   Specimen Description   Final    EXPECTORATED SPUTUM Performed at [facility], [address] [place], [place] [address]    Special Requests   Final    NONE Reflexed from [phone] Performed at [facility], [address] [place], [place] [address]    Gram Stain   Final    ABUNDANT WBC PRESENT,BOTH PMN AND MONONUCLEAR FEW GRAM POSITIVE COCCI FEW GRAM NEGATIVE RODS FEW SQUAMOUS EPITHELIAL CELLS PRESENT Performed at [facility], [address] [place], [place] [address]    Culture FEW STAPHYLOCOCCUS AUREUS  Final   Report Status 06/11/2018 FINAL  Final   Organism ID, Bacteria STAPHYLOCOCCUS AUREUS  Final      Susceptibility   Staphylococcus aureus - MIC*    CIPROFLOXACIN <=0.5 SENSITIVE Sensitive     ERYTHROMYCIN >=8 RESISTANT Resistant     GENTAMICIN <=0.5 SENSITIVE Sensitive      OXACILLIN 0.5 SENSITIVE Sensitive     TETRACYCLINE <=1 SENSITIVE Sensitive     VANCOMYCIN 1 SENSITIVE  Sensitive     TRIMETH/SULFA <=10 SENSITIVE Sensitive     CLINDAMYCIN <=0.25 SENSITIVE Sensitive     RIFAMPIN <=0.5 SENSITIVE Sensitive     Inducible Clindamycin NEGATIVE Sensitive     * FEW STAPHYLOCOCCUS AUREUS  MRSA PCR Screening     Status: None   Collection Time: 06/08/18  3:22 PM  Result Value Ref Range Status   MRSA by PCR NEGATIVE NEGATIVE Final    Comment:        The GeneXpert MRSA Assay (FDA approved for NASAL specimens only), is one component of a comprehensive MRSA colonization surveillance program. It is not intended to diagnose MRSA infection nor to guide or monitor treatment for MRSA infections. Performed at [facility], [address] [place], [place] [address]   Culture, blood (routine x 2)     Status: None   Collection Time: 06/10/18  3:14 AM  Result Value Ref Range Status   Specimen Description   Final    BLOOD LEFT ARM Performed at [facility], [address] [place], [place] [address]    Special Requests   Final    BOTTLES DRAWN AEROBIC ONLY Blood Culture adequate volume Performed at [facility], [address] [place], [place] [address]    Culture   Final    NO GROWTH 5 DAYS Performed at [facility], [address], [place], [place] [address]    Report Status 06/15/2018 FINAL  Final  Culture, blood (routine x 2)     Status: None   Collection Time: 06/10/18  3:14 AM  Result Value Ref Range Status   Specimen Description   Final    BLOOD LEFT HAND Performed at [facility], [address], [place], [place] [address]    Special Requests   Final    BOTTLES DRAWN AEROBIC ONLY Blood Culture results may not be optimal due to an inadequate volume of blood received in culture bottles Performed at [facility], [address], [place], [place]  [address]    Culture   Final    NO GROWTH 5 DAYS Performed at [facility], [address], [place], [place] [address]    Report Status 06/15/2018 FINAL  Final  MRSA PCR Screening     Status: None   Collection Time: 06/13/18  7:55 PM  Result Value Ref Range Status   MRSA by PCR NEGATIVE NEGATIVE Final    Comment:        The GeneXpert MRSA Assay (FDA approved for NASAL specimens only), is one component of a comprehensive MRSA colonization surveillance program. It is not intended to diagnose MRSA infection nor to guide or monitor treatment for MRSA infections. Performed at [facility], [address] [place], [place] [address]          Radiology Studies: No results found.      Scheduled Meds: . buprenorphine-naloxone  1 tablet Sublingual Daily  . enoxaparin (LOVENOX) injection  40 mg Subcutaneous Q24H  . mouth rinse  15 mL Mouth Rinse BID  . polyethylene glycol  17 g Oral Daily  . risperiDONE  1 mg Oral QHS  . senna  2 tablet Oral Daily   Continuous Infusions: . sodium chloride Stopped (06/14/18 1712)  .  ceFAZolin (ANCEF) IV Stopped (06/15/18 1130)     LOS: 7 days    Time spent: 35    Delaine Lame, MD [facility]  If 7PM-7AM,  please contact night-coverage [email or web address] Password [email or web address] 06/15/2018, 2:58 PM

## 2018-06-15 NOTE — Progress Notes (Signed)
Pt could not tolerate full exam was able to obtain non-contrasted study for MR T-Spine, MR L-Spine, and MR Right Shoulder.  Pt moved often during exam pt was told to hold still and he didn't realize he was moving.

## 2018-06-15 NOTE — Addendum Note (Signed)
Addendum  created 06/15/18 1007 by Kaylyn Layer, MD   Intraprocedure Staff edited

## 2018-06-16 ENCOUNTER — Inpatient Hospital Stay (HOSPITAL_COMMUNITY): Payer: Self-pay

## 2018-06-16 LAB — COMPREHENSIVE METABOLIC PANEL
AST: 49 U/L — ABNORMAL HIGH (ref 15–41)
Anion gap: 5 (ref 5–15)
CO2: 29 mmol/L (ref 22–32)
Calcium: 7.7 mg/dL — ABNORMAL LOW (ref 8.9–10.3)
Chloride: 95 mmol/L — ABNORMAL LOW (ref 98–111)
Creatinine, Ser: 1.37 mg/dL — ABNORMAL HIGH (ref 0.61–1.24)
GFR calc Af Amer: >60 mL/min (ref >60)
GFR calc non Af Amer: >60 mL/min (ref >60)
Glucose, Bld: 116 mg/dL — ABNORMAL HIGH (ref 70–99)
Sodium: 129 mmol/L — ABNORMAL LOW (ref 135–145)

## 2018-06-16 LAB — CBC
HCT: 23.4 % — ABNORMAL LOW (ref 39.0–52.0)
Hemoglobin: 7.3 g/dL — ABNORMAL LOW (ref 13.0–17.0)
MCH: 27.8 pg (ref 26.0–34.0)
MCHC: 31.2 g/dL (ref 30.0–36.0)
MCV: 89 fL (ref 80.0–100.0)
Platelets: 342 K/uL (ref 150–400)
RBC: 2.63 MIL/uL — ABNORMAL LOW (ref 4.22–5.81)
RDW: 13.7 % (ref 11.5–15.5)
WBC: 16.5 10*3/uL — ABNORMAL HIGH (ref 4.0–10.5)
nRBC: 0 % (ref 0.0–0.2)

## 2018-06-16 LAB — COMPREHENSIVE METABOLIC PANEL WITH GFR
ALT: 13 U/L (ref 0–44)
Albumin: 1.3 g/dL — ABNORMAL LOW (ref 3.5–5.0)
Alkaline Phosphatase: 137 U/L — ABNORMAL HIGH (ref 38–126)
BUN: 29 mg/dL — ABNORMAL HIGH (ref 6–20)
Potassium: 4.9 mmol/L (ref 3.5–5.1)
Total Bilirubin: 0.7 mg/dL (ref 0.3–1.2)
Total Protein: 5.6 g/dL — ABNORMAL LOW (ref 6.5–8.1)

## 2018-06-16 LAB — MAGNESIUM: Magnesium: 2 mg/dL (ref 1.7–2.4)

## 2018-06-16 MED ORDER — IOHEXOL 300 MG/ML  SOLN
100.0000 mL | Freq: Once | INTRAMUSCULAR | Status: AC | PRN
  Administered 2018-06-16: 75 mL via INTRAVENOUS

## 2018-06-16 MED ORDER — LIDOCAINE HCL (PF) 1 % IJ SOLN
5.0000 mL | Freq: Once | INTRAMUSCULAR | Status: AC
  Administered 2018-06-16: 5 mL via INTRADERMAL

## 2018-06-16 MED ORDER — LIDOCAINE HCL (PF) 1 % IJ SOLN
INTRAMUSCULAR | Status: AC
  Administered 2018-06-16: 5 mL via INTRADERMAL
  Filled 2018-06-16: qty 5

## 2018-06-16 MED ORDER — IOPAMIDOL (ISOVUE-M 200) INJECTION 41%: 20.0000 mL | Freq: Once | INTRAMUSCULAR | Status: DC

## 2018-06-16 MED ORDER — SODIUM CHLORIDE 0.9 % IJ SOLN
20.0000 mL | Freq: Once | INTRAMUSCULAR | Status: AC
  Administered 2018-06-16: 5 mL via INTRAVENOUS

## 2018-06-16 MED ORDER — SODIUM CHLORIDE 0.9 % IJ SOLN
INTRAMUSCULAR | Status: AC
  Administered 2018-06-16: 5 mL via INTRAVENOUS
  Filled 2018-06-16: qty 30

## 2018-06-16 MED ORDER — IOPAMIDOL (ISOVUE-M 200) INJECTION 41%
INTRAMUSCULAR | Status: AC
  Administered 2018-06-16: 2 mL via INTRA_ARTICULAR
  Filled 2018-06-16: qty 10

## 2018-06-16 NOTE — Progress Notes (Cosign Needed)
   [facility] will sign off.   Medication Recommendations:  Continue IV antibiotics per ID and medicine team Other recommendations (labs, testing, etc):  None at this time Follow up as an outpatient:  Please notify cardiology closer to discharge and we will arrange outpatient follow-up for monitoring of TR.

## 2018-06-16 NOTE — Progress Notes (Signed)
Patient ID: Oscar Burke, male   DOB: 1980-09-22, 37 y.o.   MRN: [id number]  Stat gram stain on right shoulder synovial fluid showed no organisms. This together with his good ROM make the possibility of septic arthritis very low. IR was only able to get a very small amount of fluid so cell count and/or culture will likely not be able to be done. Based on this will allow pt to eat, no surgical indication at this time.    Freeman Caldron, PA-C Orthopedic Surgery [phone]

## 2018-06-16 NOTE — Progress Notes (Signed)
PROGRESS NOTE    Oscar Burke  OZD:[id number] DOB: 1980/12/04 DOA: 06/08/2018 PCP: Patient, No Pcp Per    Brief Narrative:  37 year old with past medical history relevant for IV heroin abuse, chronic hepatitis C who presented to the emergency department with fevers and body aches as well as whitish sputum and tachypnea and acute hypoxic respiratory failure and found to have septic pulmonary emboli and MSSA tricuspid valve endocarditis.   Assessment & Plan:   Principal Problem:   Substance induced mood disorder (HCC) Active Problems:   Acute on chronic respiratory failure with hypoxia (HCC)   Polysubstance abuse (HCC)   Alcohol dependence with unspecified alcohol-induced disorder (HCC)   Abdominal pain   Hepatitis C, chronic (HCC)   Endocarditis of tricuspid valve   Septic embolism (HCC)   Multifocal pneumonia   Tobacco abuse   #) MSSA tricuspid valve endocarditis complicated by septic emboli: Patient continues to have a fever. -Infectious diseases following, appreciate recommendations -MRI of spine shows no evidence of metastatic disease -MRI right shoulder shows shoulder effusion and large abscess along subscapularis - Blood cultures from 06/08/2018 growing MSSA -Blood cultures from 06/10/2018 negative -Blood cultures from 06/15/2018 no growth to date - blood cultures ordered today 06/16/2018 -Continue IV cefazolin started 06/2018, per infectious disease if this fever is breakthrough than they would consider switching to daptomycin  #) Right shoulder effusion: Noted on MRI on 06/15/2018 -Orthopedic surgery consulted, recommended interventional radiology approach - Interventional radiology consulted, pending shoulder arthrocentesis  #) Polysubstance abuse: -Continue Suboxone 1 tablet daily  #) Pain/psych: -Continue PRN cycle Benzapril clonazepam  #) Acute hypoxic respiratory failure: Likely secondary to septic emboli in the lungs.  This is resolved now  #) Chronic hep  C: -Outpatient treatment  Fluids: Tolerating p.o. Electrodes: Monitor and supplement Nutrition: Regular diet  Prophylaxis: Enoxaparin  Disposition: Pending completion of IV antibiotics  Full code   Consultants:   Infectious disease  Cardio thoracic surgery  Cardiology  Procedures:  06/08/2018 echo:- Left ventricle: The cavity size was normal. Wall thickness was   normal. Systolic function was normal. The estimated ejection   fraction was in the range of 55% to 60%. Wall motion was normal;   there were no regional wall motion abnormalities. Left   ventricular diastolic function parameters were normal. - Ventricular septum: Very mildly D-shaped interventricular septum   is concerning for RV pressure/volume overload. - Aortic valve: There was no stenosis. - Mitral valve: There was no regurgitation. - Right ventricle: The cavity size was mildly dilated. Systolic   function was normal. - Right atrium: The atrium was mildly dilated. - Tricuspid valve: There was mild-moderate regurgitation. Suspected   tricuspid vegetation. Peak RV-RA gradient (S): 25 mm Hg. - Pulmonary arteries: PA peak pressure: 33 mm Hg (S). - Systemic veins: IVC measured 2.3 cm with > 50% respirophasic   variation, suggesting RA pressure 8 mmHg. - Pericardium, extracardiac: Small circumferential pericardial   effusion.  Impressions:  - Normal LV size and systolic function, EF 55-60%. Normal diastolic   function. Mildly D-shaped interventricular septum suggesting a   degree of RV pressure/volume overload. Mildly dilated RV with   normal systolic function. Mild to moderate TR. There does appear    to be a tricuspid valve vegetation. Need TEE to confirm.   06/13/2018 TEE:- Left ventricle: Systolic function was normal. The estimated   ejection fraction was in the range of 60% to 65%. - Aortic valve: No evidence of vegetation. - Mitral valve: No evidence of  vegetation. - Left atrium: No evidence of  thrombus in the atrial cavity or   appendage. - Right atrium: No evidence of thrombus in the atrial cavity or   appendage. - Atrial septum: No defect or patent foramen ovale was identified. - Tricuspid valve: Large vegetation on the lateral leaflet of the   TV measuring 2 cm in longest dimension. There was moderate-severe   regurgitation. - Pulmonic valve: No evidence of vegetation.  Antimicrobials:   IV cefazolin started 06/08/2018   Subjective: Patient is quite drowsy in the room today.  His significant other is in the room though she appears to be quite agitated as well.  He complains of some shoulder pain and achiness but otherwise denies any chest pain, nausea, vomiting, diarrhea, cough, congestion, rhinorrhea.  Objective: Vitals:   06/15/18 2148 06/16/18 0011 06/16/18 0606 06/16/18 0800  BP: 121/74  121/70   Pulse: 98  (!) 101   Resp:    (!) 26  Temp: (!) 102.7 F (39.3 C) 98.8 F (37.1 C) 100.2 F (37.9 C)   TempSrc: Oral Oral Oral   SpO2: 95%  96%   Weight:   79.2 kg   Height:        Intake/Output Summary (Last 24 hours) at 06/16/2018 1303 Last data filed at 06/16/2018 0600 Gross per 24 hour  Intake 120 ml  Output 300 ml  Net -180 ml   Filed Weights   06/14/18 0500 06/15/18 0500 06/16/18 0606  Weight: 79.3 kg 79.5 kg 79.2 kg    Examination:  General exam: Appears calm and comfortable  Respiratory system: Clear to auscultation. Respiratory effort normal. Cardiovascular system: Regular rate and rhythm, 2 out of 6 systolic murmur heard best at left upper sternal border Gastrointestinal system: Abdomen is nondistended, soft and nontender. No organomegaly or masses felt. Normal bowel sounds heard. Central nervous system: Alert and oriented. No focal neurological deficits. Extremities: No lower extremity edema.  No midline tenderness over back and shoulder on right Skin: Healed injection site Psychiatry: Judgement and insight appear poor. Mood & affect  appropriate.     Data Reviewed: I have personally reviewed following labs and imaging studies  CBC: Recent Labs  Lab 06/11/18 0326 06/12/18 0343 06/13/18 0328 06/15/18 0423 06/16/18 0341  WBC 18.5* 17.5* 18.9* 17.8* 16.5*  HGB 9.6* 8.9* 8.2* 8.4* 7.3*  HCT 28.4* 26.4* 24.7* 25.9* 23.4*  MCV 86.6 87.4 87.6 88.1 89.0  PLT 157 194 258 324 342   Basic Metabolic Panel: Recent Labs  Lab 06/12/18 0343 06/13/18 0328 06/13/18 1935 06/15/18 0423 06/16/18 0341  NA 126* 127* 126* 127* 129*  K 3.6 4.1 4.3 4.5 4.9  CL 90* 90* 89* 91* 95*  CO2 28 29 28 29 29   GLUCOSE 116* 110* 99 114* 116*  BUN 22* 20 22* 29* 29*  CREATININE 1.08 1.03 1.20 1.33* 1.37*  CALCIUM 7.5* 7.3* 7.3* 7.6* 7.7*  MG  --   --   --   --  2.0   GFR: Estimated Creatinine Clearance: 77.1 mL/min (A) (by C-G formula based on SCr of 1.37 mg/dL (H)). Liver Function Tests: Recent Labs  Lab 06/10/18 0314 06/12/18 0951 06/15/18 0423 06/16/18 0341  AST 72*  --  46* 49*  ALT 36  --  11 13  ALKPHOS 105  --  136* 137*  BILITOT 1.3* 1.6* 0.8 0.7  PROT 4.6*  --  5.9* 5.6*  ALBUMIN 1.4*  --  1.3* 1.3*   No results for input(s): LIPASE, AMYLASE  in the last 168 hours. No results for input(s): AMMONIA in the last 168 hours. Coagulation Profile: No results for input(s): INR, PROTIME in the last 168 hours. Cardiac Enzymes: No results for input(s): CKTOTAL, CKMB, CKMBINDEX, TROPONINI in the last 168 hours. BNP (last 3 results) No results for input(s): PROBNP in the last 8760 hours. HbA1C: No results for input(s): HGBA1C in the last 72 hours. CBG: No results for input(s): GLUCAP in the last 168 hours. Lipid Profile: No results for input(s): CHOL, HDL, LDLCALC, TRIG, CHOLHDL, LDLDIRECT in the last 72 hours. Thyroid Function Tests: No results for input(s): TSH, T4TOTAL, FREET4, T3FREE, THYROIDAB in the last 72 hours. Anemia Panel: No results for input(s): VITAMINB12, FOLATE, FERRITIN, TIBC, IRON, RETICCTPCT in the  last 72 hours. Sepsis Labs: No results for input(s): PROCALCITON, LATICACIDVEN in the last 168 hours.  Recent Results (from the past 240 hour(s))  Blood Culture (routine x 2)     Status: Abnormal   Collection Time: 06/08/18  7:47 AM  Result Value Ref Range Status   Specimen Description   Final    BLOOD RIGHT ARM Performed at [facility], [address] [place], [place] [address]    Special Requests   Final    BOTTLES DRAWN AEROBIC AND ANAEROBIC Blood Culture results may not be optimal due to an excessive volume of blood received in culture bottles Performed at [facility], [address] [place], [place] [address]    Culture  Setup Time   Final    GRAM POSITIVE COCCI IN BOTH AEROBIC AND ANAEROBIC BOTTLES CRITICAL RESULT CALLED TO, READ BACK BY AND VERIFIED WITH: PHARMD N GLOGOVAC [phone] MLM    Culture (A)  Final    STAPHYLOCOCCUS AUREUS BACILLUS SPECIES Standardized susceptibility testing for this organism is not available. CRITICAL RESULT CALLED TO, READ BACK BY AND VERIFIED WITH: PHARMD MEREDITH R [id number] FCP Performed at [facility], [address] [place], [place] [address]    Report Status 06/11/2018 FINAL  Final   Organism ID, Bacteria STAPHYLOCOCCUS AUREUS  Final      Susceptibility   Staphylococcus aureus - MIC*    CIPROFLOXACIN <=0.5 SENSITIVE Sensitive     ERYTHROMYCIN >=8 RESISTANT Resistant     GENTAMICIN <=0.5 SENSITIVE Sensitive     OXACILLIN 0.5 SENSITIVE Sensitive     TETRACYCLINE <=1 SENSITIVE Sensitive     VANCOMYCIN 1 SENSITIVE Sensitive     TRIMETH/SULFA <=10 SENSITIVE Sensitive     CLINDAMYCIN <=0.25 SENSITIVE Sensitive     RIFAMPIN <=0.5 SENSITIVE Sensitive     Inducible Clindamycin NEGATIVE Sensitive     * STAPHYLOCOCCUS AUREUS  Blood Culture ID Panel (Reflexed)     Status: Abnormal   Collection Time: 06/08/18  7:47 AM  Result Value Ref Range Status   Enterococcus species NOT DETECTED NOT  DETECTED Final   Listeria monocytogenes NOT DETECTED NOT DETECTED Final   Staphylococcus species DETECTED (A) NOT DETECTED Final    Comment: CRITICAL RESULT CALLED TO, READ BACK BY AND VERIFIED WITH: PHARMD N GLOGOVAC [phone] MLM    Staphylococcus aureus (BCID) DETECTED (A) NOT DETECTED Final    Comment: Methicillin (oxacillin) susceptible Staphylococcus aureus (MSSA). Preferred therapy is anti staphylococcal beta lactam antibiotic (Cefazolin or Nafcillin), unless clinically contraindicated. CRITICAL RESULT CALLED TO, READ BACK BY AND VERIFIED WITH: PHARMD N GLOGOVAC [phone] MLM    Methicillin resistance NOT DETECTED NOT DETECTED Final   Streptococcus species NOT DETECTED NOT DETECTED  Final   Streptococcus agalactiae NOT DETECTED NOT DETECTED Final   Streptococcus pneumoniae NOT DETECTED NOT DETECTED Final   Streptococcus pyogenes NOT DETECTED NOT DETECTED Final   Acinetobacter baumannii NOT DETECTED NOT DETECTED Final   Enterobacteriaceae species NOT DETECTED NOT DETECTED Final   Enterobacter cloacae complex NOT DETECTED NOT DETECTED Final   Escherichia coli NOT DETECTED NOT DETECTED Final   Klebsiella oxytoca NOT DETECTED NOT DETECTED Final   Klebsiella pneumoniae NOT DETECTED NOT DETECTED Final   Proteus species NOT DETECTED NOT DETECTED Final   Serratia marcescens NOT DETECTED NOT DETECTED Final   Haemophilus influenzae NOT DETECTED NOT DETECTED Final   Neisseria meningitidis NOT DETECTED NOT DETECTED Final   Pseudomonas aeruginosa NOT DETECTED NOT DETECTED Final   Candida albicans NOT DETECTED NOT DETECTED Final   Candida glabrata NOT DETECTED NOT DETECTED Final   Candida krusei NOT DETECTED NOT DETECTED Final   Candida parapsilosis NOT DETECTED NOT DETECTED Final   Candida tropicalis NOT DETECTED NOT DETECTED Final    Comment: Performed at [facility], [address], [place], [place] [address]  Blood Culture (routine x 2)     Status: Abnormal   Collection Time:  06/08/18  7:55 AM  Result Value Ref Range Status   Specimen Description   Final    BLOOD RIGHT WRIST Performed at [facility], [address] [place], [place] [address]    Special Requests   Final    BOTTLES DRAWN AEROBIC AND ANAEROBIC Blood Culture results may not be optimal due to an excessive volume of blood received in culture bottles Performed at [facility], [address], [place], [place] [address]    Culture  Setup Time   Final    GRAM POSITIVE COCCI IN BOTH AEROBIC AND ANAEROBIC BOTTLES CRITICAL VALUE NOTED.  VALUE IS CONSISTENT WITH PREVIOUSLY REPORTED AND CALLED VALUE.    Culture (A)  Final    STAPHYLOCOCCUS AUREUS SUSCEPTIBILITIES PERFORMED ON PREVIOUS CULTURE WITHIN THE LAST 5 DAYS. Performed at [facility], [address], [place], [place] [address]    Report Status 06/11/2018 FINAL  Final  Culture, sputum-assessment     Status: None   Collection Time: 06/08/18  3:06 PM  Result Value Ref Range Status   Specimen Description EXPECTORATED SPUTUM  Final   Special Requests NONE  Final   Sputum evaluation   Final    THIS SPECIMEN IS ACCEPTABLE FOR SPUTUM CULTURE Performed at [facility], [address], [place], [place] [address]    Report Status 06/08/2018 FINAL  Final  Culture, respiratory     Status: None   Collection Time: 06/08/18  3:06 PM  Result Value Ref Range Status   Specimen Description   Final    EXPECTORATED SPUTUM Performed at [facility], [address], [place], [place] [address]    Special Requests   Final    NONE Reflexed from [id number] Performed at [facility], [address], [place], [place] [address]    Gram Stain   Final    ABUNDANT WBC PRESENT,BOTH PMN AND MONONUCLEAR FEW GRAM POSITIVE COCCI FEW GRAM NEGATIVE RODS FEW SQUAMOUS EPITHELIAL CELLS PRESENT Performed at [facility], [address] [place], [place] [address]     Culture FEW STAPHYLOCOCCUS AUREUS  Final   Report Status 06/11/2018 FINAL  Final   Organism ID, Bacteria STAPHYLOCOCCUS AUREUS  Final      Susceptibility  Staphylococcus aureus - MIC*    CIPROFLOXACIN <=0.5 SENSITIVE Sensitive     ERYTHROMYCIN >=8 RESISTANT Resistant     GENTAMICIN <=0.5 SENSITIVE Sensitive     OXACILLIN 0.5 SENSITIVE Sensitive     TETRACYCLINE <=1 SENSITIVE Sensitive     VANCOMYCIN 1 SENSITIVE Sensitive     TRIMETH/SULFA <=10 SENSITIVE Sensitive     CLINDAMYCIN <=0.25 SENSITIVE Sensitive     RIFAMPIN <=0.5 SENSITIVE Sensitive     Inducible Clindamycin NEGATIVE Sensitive     * FEW STAPHYLOCOCCUS AUREUS  MRSA PCR Screening     Status: None   Collection Time: 06/08/18  3:22 PM  Result Value Ref Range Status   MRSA by PCR NEGATIVE NEGATIVE Final    Comment:        The GeneXpert MRSA Assay (FDA approved for NASAL specimens only), is one component of a comprehensive MRSA colonization surveillance program. It is not intended to diagnose MRSA infection nor to guide or monitor treatment for MRSA infections. Performed at [facility], [address], [place], [place] [address]   Culture, blood (routine x 2)     Status: None   Collection Time: 06/10/18  3:14 AM  Result Value Ref Range Status   Specimen Description   Final    BLOOD LEFT ARM Performed at [facility], [address], [place], [place] [address]    Special Requests   Final    BOTTLES DRAWN AEROBIC ONLY Blood Culture adequate volume Performed at [facility], [address] [place], [place] [address]    Culture   Final    NO GROWTH 5 DAYS Performed at [facility], [address], [place], [place] [address]    Report Status 06/15/2018 FINAL  Final  Culture, blood (routine x 2)     Status: None   Collection Time: 06/10/18  3:14 AM  Result Value Ref Range Status   Specimen Description   Final    BLOOD LEFT HAND Performed at [facility], [address], [place], [place] [address]    Special Requests   Final    BOTTLES DRAWN AEROBIC ONLY Blood Culture results may not be optimal due to an inadequate volume of blood received in culture bottles Performed at [facility], [address], [place], [place] [address]    Culture   Final    NO GROWTH 5 DAYS Performed at [facility], [address], [place], [place] [address]    Report Status 06/15/2018 FINAL  Final  MRSA PCR Screening     Status: None   Collection Time: 06/13/18  7:55 PM  Result Value Ref Range Status   MRSA by PCR NEGATIVE NEGATIVE Final    Comment:        The GeneXpert MRSA Assay (FDA approved for NASAL specimens only), is one component of a comprehensive MRSA colonization surveillance program. It is not intended to diagnose MRSA infection nor to guide or monitor treatment for MRSA infections. Performed at [facility], [address] [place], [place] [address]   Culture, blood (routine x 2)     Status: None (Preliminary result)   Collection Time: 06/15/18  9:10 AM  Result Value Ref Range Status   Specimen Description BLOOD LEFT ANTECUBITAL  Final   Special Requests   Final    BOTTLES DRAWN AEROBIC ONLY Blood Culture results may not be optimal due to an inadequate volume of blood received  in culture bottles   Culture   Final    NO GROWTH < 24 HOURS Performed at [facility], [address], [place], [place] [address]    Report Status PENDING  Incomplete  Culture, blood (routine x 2)     Status: None (Preliminary result)   Collection Time: 06/15/18  9:20 AM  Result Value Ref Range Status   Specimen Description BLOOD LEFT ANTECUBITAL  Final   Special Requests   Final    BOTTLES DRAWN AEROBIC ONLY Blood Culture results may not be optimal due to an inadequate volume of blood received in culture bottles   Culture   Final    NO GROWTH < 24 HOURS Performed at [facility], [address] [place], [place] [address]    Report Status PENDING  Incomplete  Body fluid culture     Status: None (Preliminary result)   Collection Time: 06/16/18 11:16 AM  Result Value Ref Range Status   Specimen Description SYNOVIAL RIGHT SHOULDER  Final   Special Requests NONE  Final   Gram Stain   Final    RARE WBC PRESENT, PREDOMINANTLY PMN NO ORGANISMS SEEN CRITICAL RESULT CALLED TO, READ BACK BY AND VERIFIED WITH: DR JEFFERY AT 1157 ON [id number] BY SJW Performed at [facility], [address] [place], [place] [address]    Culture PENDING  Incomplete   Report Status PENDING  Incomplete         Radiology Studies: Dg Orthopantogram  Result Date: 06/15/2018 CLINICAL DATA:  Patient reports he has had blood clots in his heart breaking up. Patient denies any jaw pains. Prior injury with 3 fractures to his mandible. Claims to have 3 plates and 13 screws in his mandible. Patient reports no known tooth infections, but has had multiple broken teeth. EXAM: ORTHOPANTOGRAM/PANORAMIC COMPARISON:  None. FINDINGS: No acute fracture. There are fixation plates along the posterior right mandibular body and angle and anterior left mandibular body extending to the left symphysis. Orthopedic hardware appears well seated. No bone lesion. There is a small area periapical lucency at the base of the left mandibular lateral incisor. There are missing maxillary teeth, 1 and 16, and missing mandibular teeth, 17, 18, 19, 30 and 32. IMPRESSION: 1. No acute fracture.  No bone lesion. 2. Small area periapical lucency at the base of the left mandibular lateral incisor. Consider a root abscess if there are consistent clinical findings. 3. Multiple chronically missing teeth as described. 4. Old fractures treated with prior ORIF. Electronically Signed   By: Amie Portland M.D.   On: 06/15/2018 15:18   Mr Thoracic Spine Wo Contrast  Result Date: 06/15/2018 CLINICAL DATA:  Back pain with concern for epidural abscess. IV  drug use. EXAM: MRI THORACIC AND LUMBAR SPINE WITHOUT CONTRAST TECHNIQUE: Multiplanar and multiecho pulse sequences of the thoracic and lumbar spine were obtained without intravenous contrast. COMPARISON:  CT abdomen pelvis 06/08/2018 FINDINGS: The study is degraded by motion, despite efforts to reduce this artifact. The findings of the study are interpreted in the context of reduced sensitivity/specificity. MRI THORACIC SPINE FINDINGS Alignment:  Physiologic. Vertebrae: No fracture, evidence of discitis, or bone lesion. Cord:  Normal signal and morphology. Paraspinal and other soft tissues: Multiple areas of consolidation within both lungs. Disc levels: No disc herniation or spinal canal stenosis. MRI LUMBAR SPINE FINDINGS Segmentation:  Standard. Alignment:  Physiologic. Vertebrae:  No fracture, evidence of discitis, or bone lesion. Conus medullaris and cauda equina: Conus extends  to the L1 level. Conus and cauda equina appear normal. Paraspinal and other soft tissues: Negative Disc levels: The L1-2 and L2-3 disc levels are normal. L3-L4: Mild facet hypertrophy without stenosis. L4-L5: Diffuse disc bulge, left eccentric, with mild bilateral facet hypertrophy. There is narrowing of the left lateral recess and moderate left neural foraminal stenosis. L5-S1: No disc herniation or stenosis. IMPRESSION: MR THORACIC SPINE IMPRESSION 1. No discitis-osteomyelitis or epidural abscess. 2. Multifocal cavitary consolidation within both lungs. MR LUMBAR SPINE IMPRESSION 1. No discitis-osteomyelitis or epidural abscess. 2. Lower lumbar degenerative disc disease, worst at L4-L5 where there is narrowing of the left lateral recess and moderate left neural foraminal stenosis. Electronically Signed   By: Deatra Mierzwa M.D.   On: 06/15/2018 21:51   Mr Lumbar Spine Wo Contrast  Result Date: 06/15/2018 CLINICAL DATA:  Back pain with concern for epidural abscess. IV drug use. EXAM: MRI THORACIC AND LUMBAR SPINE WITHOUT CONTRAST  TECHNIQUE: Multiplanar and multiecho pulse sequences of the thoracic and lumbar spine were obtained without intravenous contrast. COMPARISON:  CT abdomen pelvis 06/08/2018 FINDINGS: The study is degraded by motion, despite efforts to reduce this artifact. The findings of the study are interpreted in the context of reduced sensitivity/specificity. MRI THORACIC SPINE FINDINGS Alignment:  Physiologic. Vertebrae: No fracture, evidence of discitis, or bone lesion. Cord:  Normal signal and morphology. Paraspinal and other soft tissues: Multiple areas of consolidation within both lungs. Disc levels: No disc herniation or spinal canal stenosis. MRI LUMBAR SPINE FINDINGS Segmentation:  Standard. Alignment:  Physiologic. Vertebrae:  No fracture, evidence of discitis, or bone lesion. Conus medullaris and cauda equina: Conus extends to the L1 level. Conus and cauda equina appear normal. Paraspinal and other soft tissues: Negative Disc levels: The L1-2 and L2-3 disc levels are normal. L3-L4: Mild facet hypertrophy without stenosis. L4-L5: Diffuse disc bulge, left eccentric, with mild bilateral facet hypertrophy. There is narrowing of the left lateral recess and moderate left neural foraminal stenosis. L5-S1: No disc herniation or stenosis. IMPRESSION: MR THORACIC SPINE IMPRESSION 1. No discitis-osteomyelitis or epidural abscess. 2. Multifocal cavitary consolidation within both lungs. MR LUMBAR SPINE IMPRESSION 1. No discitis-osteomyelitis or epidural abscess. 2. Lower lumbar degenerative disc disease, worst at L4-L5 where there is narrowing of the left lateral recess and moderate left neural foraminal stenosis. Electronically Signed   By: Deatra Sandstrom M.D.   On: 06/15/2018 21:51   Mr Shoulder Right Wo Contrast  Result Date: 06/15/2018 CLINICAL DATA:  Septic emboli, possible septic arthritis of the shoulder. IV drug abuse. EXAM: MRI OF THE RIGHT SHOULDER WITHOUT CONTRAST TECHNIQUE: Multiplanar, multisequence MR imaging of  the shoulder was performed. No intravenous contrast was administered. COMPARISON:  Known FINDINGS: Despite efforts by the technologist and patient, motion artifact is present on today's exam and could not be eliminated. This reduces exam sensitivity and specificity. Rotator cuff:  Unremarkable Muscles: We partially image a large septated fluid collection anterior to the scapula partially surrounding and deep to the subscapularis muscle as on image 20/9. This is probably separate from the adjacent subcoracoid bursitis and distended subscapular recess. I am suspicious that this is an abscess within or along the subscapularis muscle. Biceps long head:  Unremarkable Acromioclavicular Joint: No arthropathy. Type II acromion. There is only trace fluid in the subacromial subdeltoid bursa. Glenohumeral Joint: Moderate glenohumeral joint effusion. This mildly distends the axillary pouch. Labrum:  Indeterminate due to motion artifact. Bones:  No osseous edema to suggest osteomyelitis. Other: Low-level edema signal between the supraspinatus  and trapezius muscles on image 11/6. IMPRESSION: 1. Large likely septated fluid collection along the margins of the subscapularis muscle, primarily between the subscapularis and the scapula, favoring abscess. This is only partially included on today's exam. 2. There is an adjacent joint effusion. Septic joint is likely although this could conceivably be reactive. An anterior needle approach to the joint with probably extend through the abscess for through some of the surrounding phlegmon. 3. Low-level infiltrative edema between the supraspinatus and trapezius muscles. Electronically Signed   By: Gaylyn Rong M.D.   On: 06/15/2018 23:07   Dg Fluoro Guided Needle Plc Aspiration/injection Loc  Result Date: 06/16/2018 CLINICAL DATA:  Right shoulder effusion with clinical suspicion of septic joint. EXAM: RIGHT SHOULDER ASPIRATION UNDER FLUOROSCOPY TECHNIQUE: An appropriate skin  entrance site was determined. The site was marked, prepped with Betadine, draped in the usual sterile fashion, and infiltrated locally with buffered Lidocaine. 22 gauge spinal needle was advanced to the medial margin of the humeral head under intermittent fluoroscopy. 1 ml of Lidocaine injected easily. A mixture of 2 cc Isovue and saline was then used to opacify the right shoulder capsule. Aspiration was then performed yielding approximately 1 cc of serosanguineous fluid, which was sent to microbiology. No immediate complication. FLUOROSCOPY TIME:  Fluoroscopy Time:  18 seconds. Number of Acquired Spot Images: 1 FINDINGS: Normal alignment of the glenohumeral joint. No osseous erosive lesions noted. A mixture of Isovue/saline was easily injected into the right glenohumeral joint. Scant amount of serosanguineous fluid was aspirated and sent to microbiology. IMPRESSION: Technically successful right shoulder diagnostic aspiration. Electronically Signed   By: Ted Mcalpine M.D.   On: 06/16/2018 11:38        Scheduled Meds: . buprenorphine-naloxone  1 tablet Sublingual Daily  . enoxaparin (LOVENOX) injection  40 mg Subcutaneous Q24H  . iopamidol  20 mL Intra-articular Once  . mouth rinse  15 mL Mouth Rinse BID  . polyethylene glycol  17 g Oral Daily  . risperiDONE  1 mg Oral QHS  . senna  2 tablet Oral Daily   Continuous Infusions: . sodium chloride 250 mL (06/15/18 1730)  .  ceFAZolin (ANCEF) IV 2 g (06/16/18 1108)     LOS: 8 days    Time spent: 35    Delaine Lame, MD [facility]  If 7PM-7AM, please contact night-coverage [email or web address] Password [email or web address] 06/16/2018, 1:03 PM

## 2018-06-16 NOTE — Progress Notes (Signed)
[facility]  Date of Admission:  06/08/2018   Total days of antibiotics 8        Day 8 cefazolin            Patient ID: Oscar Burke is a 37 y.o. M with      Principal Problem:   Endocarditis of tricuspid valve Active Problems:   Septic embolism (HCC)   Multifocal pneumonia   Hepatitis C, chronic (HCC)   Acute on chronic respiratory failure with hypoxia (HCC)   Polysubstance abuse (HCC)   Alcohol dependence with unspecified alcohol-induced disorder (HCC)   Abdominal pain   Tobacco abuse   Substance induced mood disorder (HCC)   . buprenorphine-naloxone  1 tablet Sublingual Daily  . enoxaparin (LOVENOX) injection  40 mg Subcutaneous Q24H  . iopamidol  20 mL Intra-articular Once  . mouth rinse  15 mL Mouth Rinse BID  . polyethylene glycol  17 g Oral Daily  . risperiDONE  1 mg Oral QHS  . senna  2 tablet Oral Daily    SUBJECTIVE: Shoulder pain improved following joint aspiration. He does not perceive any fevers and denies any chills/sweating. He does have concern over his R foot pain. He had a blister that started on his right foot over 2 weeks ago that popped and drained fluid. Now he knows there is still a spot on his foot that is causing some pain.    No Known Allergies  OBJECTIVE: Vitals:   06/15/18 2148 06/16/18 0011 06/16/18 0606 06/16/18 0800  BP: 121/74  121/70   Pulse: 98  (!) 101   Resp:    (!) 26  Temp: (!) 102.7 F (39.3 C) 98.8 F (37.1 C) 100.2 F (37.9 C)   TempSrc: Oral Oral Oral   SpO2: 95%  96%   Weight:   79.2 kg   Height:       Body mass index is 24.37 kg/m.  Physical Exam  Constitutional: He is oriented to person, place, and time. He appears well-developed. He appears lethargic.  Resting in bed comfortably.   HENT:  Mouth/Throat: Oropharynx is clear and moist. No oropharyngeal exudate.  Poor dentition   Eyes: Pupils are equal, round, and reactive to light. No scleral icterus.  Dilated pupils     Cardiovascular: Normal rate and regular rhythm.  Murmur (systolic 2-3/6) heard. Pulmonary/Chest: Effort normal. No respiratory distress. He exhibits no tenderness.  Abdominal: Soft. Bowel sounds are normal. He exhibits no distension.  Musculoskeletal: He exhibits edema (3+ edema up b/l LEs R slightly worse than L).  Lymphadenopathy:    He has no cervical adenopathy.  Neurological: He is oriented to person, place, and time. He appears lethargic.  Slurring words at times, lethargic  Skin: Skin is warm and dry. No rash noted. No erythema.  Psychiatric: His behavior is normal. Judgment normal.  Vitals reviewed.   R Foot - small bloody drainage on dressing. No surrounding edema/erythema but there is tenderness with palpating site. With dorsiflexion of foot there is tissue contents that is seen protruding through bottom of the foot. No purulent drainage expressed.      Lab Results Lab Results  Component Value Date   WBC 16.5 (H) 06/16/2018   HGB 7.3 (L) 06/16/2018   HCT 23.4 (L) 06/16/2018   MCV 89.0 06/16/2018   PLT 342 06/16/2018    Lab Results  Component Value Date   CREATININE 1.37 (H) 06/16/2018   BUN 29 (  H) 06/16/2018   NA 129 (L) 06/16/2018   K 4.9 06/16/2018   CL 95 (L) 06/16/2018   CO2 29 06/16/2018    Lab Results  Component Value Date   ALT 13 06/16/2018   AST 49 (H) 06/16/2018   ALKPHOS 137 (H) 06/16/2018   BILITOT 0.7 06/16/2018     Microbiology: Recent Results (from the past 240 hour(s))  Blood Culture (routine x 2)     Status: Abnormal   Collection Time: 06/08/18  7:47 AM  Result Value Ref Range Status   Specimen Description   Final    BLOOD RIGHT ARM Performed at [facility], [address], [place], [place] [address]    Special Requests   Final    BOTTLES DRAWN AEROBIC AND ANAEROBIC Blood Culture results may not be optimal due to an excessive volume of blood received in culture bottles Performed at [facility],  [address], [place], [place] [address]    Culture  Setup Time   Final    GRAM POSITIVE COCCI IN BOTH AEROBIC AND ANAEROBIC BOTTLES CRITICAL RESULT CALLED TO, READ BACK BY AND VERIFIED WITH: PHARMD N GLOGOVAC [phone] MLM    Culture (A)  Final    STAPHYLOCOCCUS AUREUS BACILLUS SPECIES Standardized susceptibility testing for this organism is not available. CRITICAL RESULT CALLED TO, READ BACK BY AND VERIFIED WITH: PHARMD MEREDITH R [id number] FCP Performed at [facility], [address] [place], [place] [address]    Report Status 06/11/2018 FINAL  Final   Organism ID, Bacteria STAPHYLOCOCCUS AUREUS  Final      Susceptibility   Staphylococcus aureus - MIC*    CIPROFLOXACIN <=0.5 SENSITIVE Sensitive     ERYTHROMYCIN >=8 RESISTANT Resistant     GENTAMICIN <=0.5 SENSITIVE Sensitive     OXACILLIN 0.5 SENSITIVE Sensitive     TETRACYCLINE <=1 SENSITIVE Sensitive     VANCOMYCIN 1 SENSITIVE Sensitive     TRIMETH/SULFA <=10 SENSITIVE Sensitive     CLINDAMYCIN <=0.25 SENSITIVE Sensitive     RIFAMPIN <=0.5 SENSITIVE Sensitive     Inducible Clindamycin NEGATIVE Sensitive     * STAPHYLOCOCCUS AUREUS  Blood Culture ID Panel (Reflexed)     Status: Abnormal   Collection Time: 06/08/18  7:47 AM  Result Value Ref Range Status   Enterococcus species NOT DETECTED NOT DETECTED Final   Listeria monocytogenes NOT DETECTED NOT DETECTED Final   Staphylococcus species DETECTED (A) NOT DETECTED Final    Comment: CRITICAL RESULT CALLED TO, READ BACK BY AND VERIFIED WITH: PHARMD N GLOGOVAC [phone] MLM    Staphylococcus aureus (BCID) DETECTED (A) NOT DETECTED Final    Comment: Methicillin (oxacillin) susceptible Staphylococcus aureus (MSSA). Preferred therapy is anti staphylococcal beta lactam antibiotic (Cefazolin or Nafcillin), unless clinically contraindicated. CRITICAL RESULT CALLED TO, READ BACK BY AND VERIFIED WITH: PHARMD N GLOGOVAC [phone] MLM    Methicillin resistance NOT  DETECTED NOT DETECTED Final   Streptococcus species NOT DETECTED NOT DETECTED Final   Streptococcus agalactiae NOT DETECTED NOT DETECTED Final   Streptococcus pneumoniae NOT DETECTED NOT DETECTED Final   Streptococcus pyogenes NOT DETECTED NOT DETECTED Final   Acinetobacter baumannii NOT DETECTED NOT DETECTED Final   Enterobacteriaceae species NOT DETECTED NOT DETECTED Final   Enterobacter cloacae complex NOT DETECTED NOT DETECTED Final   Escherichia coli NOT DETECTED NOT DETECTED Final   Klebsiella oxytoca NOT DETECTED NOT DETECTED Final   Klebsiella pneumoniae NOT DETECTED NOT DETECTED Final  Proteus species NOT DETECTED NOT DETECTED Final   Serratia marcescens NOT DETECTED NOT DETECTED Final   Haemophilus influenzae NOT DETECTED NOT DETECTED Final   Neisseria meningitidis NOT DETECTED NOT DETECTED Final   Pseudomonas aeruginosa NOT DETECTED NOT DETECTED Final   Candida albicans NOT DETECTED NOT DETECTED Final   Candida glabrata NOT DETECTED NOT DETECTED Final   Candida krusei NOT DETECTED NOT DETECTED Final   Candida parapsilosis NOT DETECTED NOT DETECTED Final   Candida tropicalis NOT DETECTED NOT DETECTED Final    Comment: Performed at [facility], [address], [place], [place] [address]  Blood Culture (routine x 2)     Status: Abnormal   Collection Time: 06/08/18  7:55 AM  Result Value Ref Range Status   Specimen Description   Final    BLOOD RIGHT WRIST Performed at [facility], [address] [place], [place] [address]    Special Requests   Final    BOTTLES DRAWN AEROBIC AND ANAEROBIC Blood Culture results may not be optimal due to an excessive volume of blood received in culture bottles Performed at [facility], [address] [place], [place] [address]    Culture  Setup Time   Final    GRAM POSITIVE COCCI IN BOTH AEROBIC AND ANAEROBIC BOTTLES CRITICAL VALUE NOTED.  VALUE IS CONSISTENT WITH PREVIOUSLY REPORTED AND CALLED  VALUE.    Culture (A)  Final    STAPHYLOCOCCUS AUREUS SUSCEPTIBILITIES PERFORMED ON PREVIOUS CULTURE WITHIN THE LAST 5 DAYS. Performed at [facility], [address] [place], [place] [address]    Report Status 06/11/2018 FINAL  Final  Culture, sputum-assessment     Status: None   Collection Time: 06/08/18  3:06 PM  Result Value Ref Range Status   Specimen Description EXPECTORATED SPUTUM  Final   Special Requests NONE  Final   Sputum evaluation   Final    THIS SPECIMEN IS ACCEPTABLE FOR SPUTUM CULTURE Performed at [facility], [address], [place], [place] [address]    Report Status 06/08/2018 FINAL  Final  Culture, respiratory     Status: None   Collection Time: 06/08/18  3:06 PM  Result Value Ref Range Status   Specimen Description   Final    EXPECTORATED SPUTUM Performed at [facility], [address] [place], [place] [address]    Special Requests   Final    NONE Reflexed from [id number] Performed at [facility], [address], [place], [place] [address]    Gram Stain   Final    ABUNDANT WBC PRESENT,BOTH PMN AND MONONUCLEAR FEW GRAM POSITIVE COCCI FEW GRAM NEGATIVE RODS FEW SQUAMOUS EPITHELIAL CELLS PRESENT Performed at [facility], [address] [place], [place] [address]    Culture FEW STAPHYLOCOCCUS AUREUS  Final   Report Status 06/11/2018 FINAL  Final   Organism ID, Bacteria STAPHYLOCOCCUS AUREUS  Final      Susceptibility   Staphylococcus aureus - MIC*    CIPROFLOXACIN <=0.5 SENSITIVE Sensitive     ERYTHROMYCIN >=8 RESISTANT Resistant     GENTAMICIN <=0.5 SENSITIVE Sensitive     OXACILLIN 0.5 SENSITIVE Sensitive     TETRACYCLINE <=1 SENSITIVE Sensitive     VANCOMYCIN 1 SENSITIVE Sensitive     TRIMETH/SULFA <=10 SENSITIVE Sensitive     CLINDAMYCIN <=0.25 SENSITIVE Sensitive     RIFAMPIN <=0.5 SENSITIVE Sensitive     Inducible Clindamycin NEGATIVE Sensitive     *  FEW STAPHYLOCOCCUS  AUREUS  MRSA PCR Screening     Status: None   Collection Time: 06/08/18  3:22 PM  Result Value Ref Range Status   MRSA by PCR NEGATIVE NEGATIVE Final    Comment:        The GeneXpert MRSA Assay (FDA approved for NASAL specimens only), is one component of a comprehensive MRSA colonization surveillance program. It is not intended to diagnose MRSA infection nor to guide or monitor treatment for MRSA infections. Performed at [facility], [address], [place], [place] [address]   Culture, blood (routine x 2)     Status: None   Collection Time: 06/10/18  3:14 AM  Result Value Ref Range Status   Specimen Description   Final    BLOOD LEFT ARM Performed at [facility], [address], [place], [place] [address]    Special Requests   Final    BOTTLES DRAWN AEROBIC ONLY Blood Culture adequate volume Performed at [facility], [address] [place], [place] [address]    Culture   Final    NO GROWTH 5 DAYS Performed at [facility], [address], [place], [place] [address]    Report Status 06/15/2018 FINAL  Final  Culture, blood (routine x 2)     Status: None   Collection Time: 06/10/18  3:14 AM  Result Value Ref Range Status   Specimen Description   Final    BLOOD LEFT HAND Performed at [facility], [address], [place], [place] [address]    Special Requests   Final    BOTTLES DRAWN AEROBIC ONLY Blood Culture results may not be optimal due to an inadequate volume of blood received in culture bottles Performed at [facility], [address], [place], [place] [address]    Culture   Final    NO GROWTH 5 DAYS Performed at [facility], [address] [place], [place] [address]    Report Status 06/15/2018 FINAL  Final  MRSA PCR Screening     Status: None   Collection Time: 06/13/18  7:55 PM  Result Value Ref Range Status   MRSA by PCR NEGATIVE NEGATIVE Final     Comment:        The GeneXpert MRSA Assay (FDA approved for NASAL specimens only), is one component of a comprehensive MRSA colonization surveillance program. It is not intended to diagnose MRSA infection nor to guide or monitor treatment for MRSA infections. Performed at [facility], [address] [place], [place] [address]   Culture, blood (routine x 2)     Status: None (Preliminary result)   Collection Time: 06/15/18  9:10 AM  Result Value Ref Range Status   Specimen Description BLOOD LEFT ANTECUBITAL  Final   Special Requests   Final    BOTTLES DRAWN AEROBIC ONLY Blood Culture results may not be optimal due to an inadequate volume of blood received in culture bottles   Culture   Final    NO GROWTH < 24 HOURS Performed at [facility], [address], [place], [place] [address]    Report Status PENDING  Incomplete  Culture, blood (routine x 2)     Status: None (Preliminary result)   Collection Time: 06/15/18  9:20 AM  Result Value Ref Range Status   Specimen Description BLOOD LEFT ANTECUBITAL  Final   Special Requests   Final    BOTTLES DRAWN  AEROBIC ONLY Blood Culture results may not be optimal due to an inadequate volume of blood received in culture bottles   Culture   Final    NO GROWTH < 24 HOURS Performed at [facility], [address] [place], [place] [address]    Report Status PENDING  Incomplete  Body fluid culture     Status: None (Preliminary result)   Collection Time: 06/16/18 11:16 AM  Result Value Ref Range Status   Specimen Description SYNOVIAL RIGHT SHOULDER  Final   Special Requests NONE  Final   Gram Stain   Final    RARE WBC PRESENT, PREDOMINANTLY PMN NO ORGANISMS SEEN CRITICAL RESULT CALLED TO, READ BACK BY AND VERIFIED WITH: DR JEFFERY AT 1157 ON [id number] BY SJW Performed at [facility], [address] [place], [place] [address]    Culture PENDING  Incomplete   Report Status PENDING  Incomplete   Assessment:  37 y.o.  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 His blood cultures have cleared but he continues to have fevers > 102 F and elevated WBC count. He had abnormal cxr on 10/02 at admission and no follow up on this yet - with known R sided IE he is at risk for further embolization to lung or evolution of loculated process; although his WOB is normal at rest, non-hypoxic and no complaint of CP or cough to support this. May consider repeating CXR vs chest CT (has not had one yet)  Shoulder GS negative after 8d of antibiotics; he has had some symptomatic improvement in pain/ROM since this. Ortho has seen and not determined to be septic shoulder. Would continue to follow cultures to maturity.   The plantar aspect of his R foot has a wound that on the surface does not look grossly infected has some protruding tissue exposed with dorsiflexion of his foot that is painful to moderate-firm palpation (he is quite sleepy throughout the exam). May need to consider MRI of the R foot to evaluate for deeper infection here considering his ongoing fevers.   Plan:  1. Consider Chest CT (only has had lower lobes evaluated with a/p CT) 2. Consider MRI of R foot to r/o abscess/space occupying infection with protruding tissue  3. If #1 and #2 negative may need to switch agent to daptomycin (nafcillin may worsen his sx of heart failure d/t preparation in NS).  4. Continue to follow micro data   Rexene Alberts, MSN, NP-C [facility] Cell: [phone] Pager: [phone]  06/16/2018  1:22 PM

## 2018-06-16 NOTE — Consult Note (Signed)
Reason for Consult:Right shoulder pain Referring Physician: S Purohit  Oscar Burke is an 37 y.o. male.  HPI: Oscar Burke was admitted to the hospital 10/2 with endocarditis. About 3-4d ago he began having right shoulder pain. I don't see any evidence that he mentioned it until yesterday and a MRI was ordered that showed a likely anterior shoulder abscess external to the joint and a joint effusion concerning for septic arthritis. He c/o right hand numbness yesterday but this has since resolved. He denies prior e/o similar.  Past Medical History:  Diagnosis Date  . Bipolar 1 disorder ([place])   . Chronic back pain   . Hepatitis C   . Polysubstance abuse ([place])   . Smoker     Past Surgical History:  Procedure Laterality Date  . ARM AMPUTATION    . MANDIBLE FRACTURE SURGERY    . TEE WITHOUT CARDIOVERSION N/A 06/13/2018   Procedure: TRANSESOPHAGEAL ECHOCARDIOGRAM (TEE);  Surgeon: Josue Hector, MD;  Location: [facility] ENDOSCOPY;  Service: Cardiovascular;  Laterality: N/A;    Family History  Problem Relation Age of Onset  . Cancer Mother     Social History:  reports that he has been smoking cigarettes. He has been smoking about 1.00 pack per day. He has never used smokeless tobacco. He reports that he has current or past drug history. Drugs: Cocaine and IV. He reports that he does not drink alcohol.  Allergies: No Known Allergies  Medications: I have reviewed the patient's current medications.  Results for orders placed or performed during the hospital encounter of 06/08/18 (from the past 48 hour(s))  Comprehensive metabolic panel     Status: Abnormal   Collection Time: 06/15/18  4:23 AM  Result Value Ref Range   Sodium 127 (L) 135 - 145 mmol/L   Potassium 4.5 3.5 - 5.1 mmol/L   Chloride 91 (L) 98 - 111 mmol/L   CO2 29 22 - 32 mmol/L   Glucose, Bld 114 (H) 70 - 99 mg/dL   BUN 29 (H) 6 - 20 mg/dL   Creatinine, Ser 1.33 (H) 0.61 - 1.24 mg/dL   Calcium 7.6 (L) 8.9 - 10.3 mg/dL   Total  Protein 5.9 (L) 6.5 - 8.1 g/dL   Albumin 1.3 (L) 3.5 - 5.0 g/dL   AST 46 (H) 15 - 41 U/L   ALT 11 0 - 44 U/L   Alkaline Phosphatase 136 (H) 38 - 126 U/L   Total Bilirubin 0.8 0.3 - 1.2 mg/dL   GFR calc non Af Amer >60 >60 mL/min   GFR calc Af Amer >60 >60 mL/min    Comment: (NOTE) The eGFR has been calculated using the CKD EPI equation. This calculation has not been validated in all clinical situations. eGFR's persistently <60 mL/min signify possible Chronic Kidney Disease.    Anion gap 7 5 - 15    Comment: Performed at [facility] [address] [place], [place] [address]  CBC     Status: Abnormal   Collection Time: 06/15/18  4:23 AM  Result Value Ref Range   WBC 17.8 (H) 4.0 - 10.5 K/uL   RBC 2.94 (L) 4.22 - 5.81 MIL/uL   Hemoglobin 8.4 (L) 13.0 - 17.0 g/dL   HCT 25.9 (L) 39.0 - 52.0 %   MCV 88.1 80.0 - 100.0 fL   MCH 28.6 26.0 - 34.0 pg   MCHC 32.4 30.0 - 36.0 g/dL   RDW 13.6 11.5 - 15.5 %   Platelets 324 150 - 400 K/uL  nRBC 0.0 0.0 - 0.2 %    Comment: Performed at [facility], [place] [address], [place], [place] [address]  Culture, blood (routine x 2)     Status: None (Preliminary result)   Collection Time: 06/15/18  9:10 AM  Result Value Ref Range   Specimen Description BLOOD LEFT ANTECUBITAL    Special Requests      BOTTLES DRAWN AEROBIC ONLY Blood Culture results may not be optimal due to an inadequate volume of blood received in culture bottles   Culture      NO GROWTH < 24 HOURS Performed at [facility] [address], [place], [place] [address]    Report Status PENDING   Culture, blood (routine x 2)     Status: None (Preliminary result)   Collection Time: 06/15/18  9:20 AM  Result Value Ref Range   Specimen Description BLOOD LEFT ANTECUBITAL    Special Requests      BOTTLES DRAWN AEROBIC ONLY Blood Culture results may not be optimal due to an inadequate volume of blood received in culture bottles   Culture      NO GROWTH < 24  HOURS Performed at [facility] [address], [place], [place] [address]    Report Status PENDING   Comprehensive metabolic panel     Status: Abnormal   Collection Time: 06/16/18  3:41 AM  Result Value Ref Range   Sodium 129 (L) 135 - 145 mmol/L   Potassium 4.9 3.5 - 5.1 mmol/L   Chloride 95 (L) 98 - 111 mmol/L   CO2 29 22 - 32 mmol/L   Glucose, Bld 116 (H) 70 - 99 mg/dL   BUN 29 (H) 6 - 20 mg/dL   Creatinine, Ser 1.37 (H) 0.61 - 1.24 mg/dL   Calcium 7.7 (L) 8.9 - 10.3 mg/dL   Total Protein 5.6 (L) 6.5 - 8.1 g/dL   Albumin 1.3 (L) 3.5 - 5.0 g/dL   AST 49 (H) 15 - 41 U/L   ALT 13 0 - 44 U/L   Alkaline Phosphatase 137 (H) 38 - 126 U/L   Total Bilirubin 0.7 0.3 - 1.2 mg/dL   GFR calc non Af Amer >60 >60 mL/min   GFR calc Af Amer >60 >60 mL/min    Comment: (NOTE) The eGFR has been calculated using the CKD EPI equation. This calculation has not been validated in all clinical situations. eGFR's persistently <60 mL/min signify possible Chronic Kidney Disease.    Anion gap 5 5 - 15    Comment: Performed at [facility] [address], [place], [place] [address]  Magnesium     Status: None   Collection Time: 06/16/18  3:41 AM  Result Value Ref Range   Magnesium 2.0 1.7 - 2.4 mg/dL    Comment: Performed at [facility] [address] [place], [place] [address]  CBC     Status: Abnormal   Collection Time: 06/16/18  3:41 AM  Result Value Ref Range   WBC 16.5 (H) 4.0 - 10.5 K/uL   RBC 2.63 (L) 4.22 - 5.81 MIL/uL   Hemoglobin 7.3 (L) 13.0 - 17.0 g/dL   HCT 23.4 (L) 39.0 - 52.0 %   MCV 89.0 80.0 - 100.0 fL   MCH 27.8 26.0 - 34.0 pg   MCHC 31.2 30.0 - 36.0 g/dL   RDW 13.7 11.5 - 15.5 %   Platelets 342 150 - 400 K/uL   nRBC 0.0 0.0 -  0.2 %    Comment: Performed at [facility], [place] [address] [place], [place] [address]    Dg Orthopantogram  Result Date: 06/15/2018 CLINICAL DATA:  Patient reports he has had blood clots in his heart breaking up.  Patient denies any jaw pains. Prior injury with 3 fractures to his mandible. Claims to have 3 plates and 13 screws in his mandible. Patient reports no known tooth infections, but has had multiple broken teeth. EXAM: ORTHOPANTOGRAM/PANORAMIC COMPARISON:  None. FINDINGS: No acute fracture. There are fixation plates along the posterior right mandibular body and angle and anterior left mandibular body extending to the left symphysis. Orthopedic hardware appears well seated. No bone lesion. There is a small area periapical lucency at the base of the left mandibular lateral incisor. There are missing maxillary teeth, 1 and 16, and missing mandibular teeth, 17, 18, 19, 30 and 32. IMPRESSION: 1. No acute fracture.  No bone lesion. 2. Small area periapical lucency at the base of the left mandibular lateral incisor. Consider a root abscess if there are consistent clinical findings. 3. Multiple chronically missing teeth as described. 4. Old fractures treated with prior ORIF. Electronically Signed   By: Lajean Manes M.D.   On: 06/15/2018 15:18   Mr Thoracic Spine Wo Contrast  Result Date: 06/15/2018 CLINICAL DATA:  Back pain with concern for epidural abscess. IV drug use. EXAM: MRI THORACIC AND LUMBAR SPINE WITHOUT CONTRAST TECHNIQUE: Multiplanar and multiecho pulse sequences of the thoracic and lumbar spine were obtained without intravenous contrast. COMPARISON:  CT abdomen pelvis 06/08/2018 FINDINGS: The study is degraded by motion, despite efforts to reduce this artifact. The findings of the study are interpreted in the context of reduced sensitivity/specificity. MRI THORACIC SPINE FINDINGS Alignment:  Physiologic. Vertebrae: No fracture, evidence of discitis, or bone lesion. Cord:  Normal signal and morphology. Paraspinal and other soft tissues: Multiple areas of consolidation within both lungs. Disc levels: No disc herniation or spinal canal stenosis. MRI LUMBAR SPINE FINDINGS Segmentation:  Standard. Alignment:   Physiologic. Vertebrae:  No fracture, evidence of discitis, or bone lesion. Conus medullaris and cauda equina: Conus extends to the L1 level. Conus and cauda equina appear normal. Paraspinal and other soft tissues: Negative Disc levels: The L1-2 and L2-3 disc levels are normal. L3-L4: Mild facet hypertrophy without stenosis. L4-L5: Diffuse disc bulge, left eccentric, with mild bilateral facet hypertrophy. There is narrowing of the left lateral recess and moderate left neural foraminal stenosis. L5-S1: No disc herniation or stenosis. IMPRESSION: MR THORACIC SPINE IMPRESSION 1. No discitis-osteomyelitis or epidural abscess. 2. Multifocal cavitary consolidation within both lungs. MR LUMBAR SPINE IMPRESSION 1. No discitis-osteomyelitis or epidural abscess. 2. Lower lumbar degenerative disc disease, worst at L4-L5 where there is narrowing of the left lateral recess and moderate left neural foraminal stenosis. Electronically Signed   By: Ulyses Jarred M.D.   On: 06/15/2018 21:51   Mr Lumbar Spine Wo Contrast  Result Date: 06/15/2018 CLINICAL DATA:  Back pain with concern for epidural abscess. IV drug use. EXAM: MRI THORACIC AND LUMBAR SPINE WITHOUT CONTRAST TECHNIQUE: Multiplanar and multiecho pulse sequences of the thoracic and lumbar spine were obtained without intravenous contrast. COMPARISON:  CT abdomen pelvis 06/08/2018 FINDINGS: The study is degraded by motion, despite efforts to reduce this artifact. The findings of the study are interpreted in the context of reduced sensitivity/specificity. MRI THORACIC SPINE FINDINGS Alignment:  Physiologic. Vertebrae: No fracture, evidence of discitis, or bone lesion. Cord:  Normal signal and morphology. Paraspinal and  other soft tissues: Multiple areas of consolidation within both lungs. Disc levels: No disc herniation or spinal canal stenosis. MRI LUMBAR SPINE FINDINGS Segmentation:  Standard. Alignment:  Physiologic. Vertebrae:  No fracture, evidence of discitis, or bone  lesion. Conus medullaris and cauda equina: Conus extends to the L1 level. Conus and cauda equina appear normal. Paraspinal and other soft tissues: Negative Disc levels: The L1-2 and L2-3 disc levels are normal. L3-L4: Mild facet hypertrophy without stenosis. L4-L5: Diffuse disc bulge, left eccentric, with mild bilateral facet hypertrophy. There is narrowing of the left lateral recess and moderate left neural foraminal stenosis. L5-S1: No disc herniation or stenosis. IMPRESSION: MR THORACIC SPINE IMPRESSION 1. No discitis-osteomyelitis or epidural abscess. 2. Multifocal cavitary consolidation within both lungs. MR LUMBAR SPINE IMPRESSION 1. No discitis-osteomyelitis or epidural abscess. 2. Lower lumbar degenerative disc disease, worst at L4-L5 where there is narrowing of the left lateral recess and moderate left neural foraminal stenosis. Electronically Signed   By: Ulyses Jarred M.D.   On: 06/15/2018 21:51   Mr Shoulder Right Wo Contrast  Result Date: 06/15/2018 CLINICAL DATA:  Septic emboli, possible septic arthritis of the shoulder. IV drug abuse. EXAM: MRI OF THE RIGHT SHOULDER WITHOUT CONTRAST TECHNIQUE: Multiplanar, multisequence MR imaging of the shoulder was performed. No intravenous contrast was administered. COMPARISON:  Known FINDINGS: Despite efforts by the technologist and patient, motion artifact is present on today's exam and could not be eliminated. This reduces exam sensitivity and specificity. Rotator cuff:  Unremarkable Muscles: We partially image a large septated fluid collection anterior to the scapula partially surrounding and deep to the subscapularis muscle as on image 20/9. This is probably separate from the adjacent subcoracoid bursitis and distended subscapular recess. I am suspicious that this is an abscess within or along the subscapularis muscle. Biceps long head:  Unremarkable Acromioclavicular Joint: No arthropathy. Type II acromion. There is only trace fluid in the subacromial  subdeltoid bursa. Glenohumeral Joint: Moderate glenohumeral joint effusion. This mildly distends the axillary pouch. Labrum:  Indeterminate due to motion artifact. Bones:  No osseous edema to suggest osteomyelitis. Other: Low-level edema signal between the supraspinatus and trapezius muscles on image 11/6. IMPRESSION: 1. Large likely septated fluid collection along the margins of the subscapularis muscle, primarily between the subscapularis and the scapula, favoring abscess. This is only partially included on today's exam. 2. There is an adjacent joint effusion. Septic joint is likely although this could conceivably be reactive. An anterior needle approach to the joint with probably extend through the abscess for through some of the surrounding phlegmon. 3. Low-level infiltrative edema between the supraspinatus and trapezius muscles. Electronically Signed   By: Van Clines M.D.   On: 06/15/2018 23:07    Review of Systems  Constitutional: Negative for weight loss.  HENT: Negative for ear discharge, ear pain, hearing loss and tinnitus.   Eyes: Negative for blurred vision, double vision, photophobia and pain.  Respiratory: Negative for cough, sputum production and shortness of breath.   Cardiovascular: Negative for chest pain.  Gastrointestinal: Negative for abdominal pain, nausea and vomiting.  Genitourinary: Negative for dysuria, flank pain, frequency and urgency.  Musculoskeletal: Positive for joint pain (Right shoulder). Negative for back pain, falls, myalgias and neck pain.  Neurological: Positive for sensory change (Right arm, improved now). Negative for dizziness, tingling, focal weakness, loss of consciousness and headaches.  Endo/Heme/Allergies: Does not bruise/bleed easily.  Psychiatric/Behavioral: Negative for depression, memory loss and substance abuse. The patient is not nervous/anxious.    Blood  pressure 121/70, pulse (!) 101, temperature 100.2 F (37.9 C), temperature source Oral,  resp. rate 18, height 5' 11" (1.803 m), weight 79.2 kg, SpO2 96 %. Physical Exam  Constitutional: He appears well-developed and well-nourished. No distress.  HENT:  Head: Normocephalic and atraumatic.  Eyes: Conjunctivae are normal. Right eye exhibits no discharge. Left eye exhibits no discharge. No scleral icterus.  Neck: Normal range of motion.  Cardiovascular: Normal rate and regular rhythm.  Respiratory: Effort normal. No respiratory distress.  Musculoskeletal:  Right shoulder, elbow, wrist, digits- no skin wounds, mild ant/post joint line TTP, shoulder flex/ext/abd painful but near normal ROM, no instability, no blocks to motion  Sens  Ax/R/M/U intact  Mot   Ax/ R/ PIN/ M/ AIN/ U intact  Rad 2+  Neurological: He is alert.  Skin: Skin is warm and dry. He is not diaphoretic.  Psychiatric: He has a normal mood and affect. His behavior is normal.    Assessment/Plan: Right shoulder pain -- Will have IR aspirate joint. Have made NPO in case aspiration shows bacteria. Given ability to move shoulder as well as he can I doubt septic joint. It's possible IR could also place a drain for abscess but the loculated nature may preclude that as a therapeutic option. Multiple medical problems including history of tobacco use, IV heroin use, untreated bipolar disorder, chronic hepatitis C, and MSSA endocarditis -- per primary team    Lisette Abu, PA-C Orthopedic Surgery [phone] 06/16/2018, 9:55 AM

## 2018-06-16 NOTE — Care Management Note (Signed)
Case Management Note  Patient Details  Name: Oscar Burke MRN: [id number] Date of Birth: September 19, 1980  Subjective/Objective:  Pt presented as a transfer from [facility]. Hx of tobacco abuse and polysubstance abuse. 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 ID has consulted and plan will be to keep hospitalized for six weeks of IV antibiotics. Pt continues on IV Ancef.            Action/Plan: CSW following for disposition needs and CM will continue to monitor as well.   Expected Discharge Date:                Expected Discharge Plan:  Home/Self Care  In-House Referral:  Clinical Social Work, Museum/gallery exhibitions officer  CM Consult  Post Acute Care Choice:  NA Choice offered to:  NA  DME Arranged:  N/A DME Agency:  NA  HH Arranged:  NA HH Agency:  NA  Status of Service:  Completed, signed off  If discussed at [employer], dates discussed:    Additional Comments:  Gala Lewandowsky, RN 06/16/2018, 11:45 AM

## 2018-06-17 ENCOUNTER — Inpatient Hospital Stay (HOSPITAL_COMMUNITY): Payer: Self-pay

## 2018-06-17 DIAGNOSIS — L03115 Cellulitis of right lower limb: Secondary | ICD-10-CM

## 2018-06-17 DIAGNOSIS — J869 Pyothorax without fistula: Secondary | ICD-10-CM

## 2018-06-17 DIAGNOSIS — R509 Fever, unspecified: Secondary | ICD-10-CM

## 2018-06-17 DIAGNOSIS — R011 Cardiac murmur, unspecified: Secondary | ICD-10-CM

## 2018-06-17 DIAGNOSIS — K0889 Other specified disorders of teeth and supporting structures: Secondary | ICD-10-CM

## 2018-06-17 DIAGNOSIS — R5383 Other fatigue: Secondary | ICD-10-CM

## 2018-06-17 LAB — BASIC METABOLIC PANEL
BUN: 23 mg/dL — ABNORMAL HIGH (ref 6–20)
GFR calc Af Amer: >60 mL/min (ref >60)
GFR calc non Af Amer: >60 mL/min (ref >60)
Potassium: 5.1 mmol/L (ref 3.5–5.1)
Sodium: 131 mmol/L — ABNORMAL LOW (ref 135–145)

## 2018-06-17 LAB — CBC
HCT: 23 % — ABNORMAL LOW (ref 39.0–52.0)
Hemoglobin: 7.3 g/dL — ABNORMAL LOW (ref 13.0–17.0)
MCH: 28.3 pg (ref 26.0–34.0)
MCHC: 31.7 g/dL (ref 30.0–36.0)
MCV: 89.1 fL (ref 80.0–100.0)
Platelets: 391 10*3/uL (ref 150–400)
RBC: 2.58 MIL/uL — ABNORMAL LOW (ref 4.22–5.81)
RDW: 13.7 % (ref 11.5–15.5)
WBC: 17.5 10*3/uL — ABNORMAL HIGH (ref 4.0–10.5)
nRBC: 0 % (ref 0.0–0.2)

## 2018-06-17 LAB — BASIC METABOLIC PANEL WITH GFR
Anion gap: 6 (ref 5–15)
CO2: 31 mmol/L (ref 22–32)
Calcium: 8 mg/dL — ABNORMAL LOW (ref 8.9–10.3)
Chloride: 94 mmol/L — ABNORMAL LOW (ref 98–111)
Creatinine, Ser: 1.23 mg/dL (ref 0.61–1.24)
Glucose, Bld: 113 mg/dL — ABNORMAL HIGH (ref 70–99)

## 2018-06-17 LAB — MAGNESIUM: Magnesium: 1.8 mg/dL (ref 1.7–2.4)

## 2018-06-17 NOTE — Progress Notes (Signed)
Patient with history of IVDU, Hep C presented to [facility] ED with rash and fever of 103. He was found to have septic emboli, endocarditis, and a suspected subscapularis abscess.  Shoulder was aspirated in diagnostic fluoro yesterday and culture with NGTD. ID following for all of the above and obtained CT Chest yesterday which showed small, bilateral, potentially loculated pleural effusions.  IR now consulted for possible chest tube placement. Discussed case and imaging with Dr. Lowella Dandy who recommends TCTS vs. Pulmonology consult prior to proceeding with chest tube placement.  Loyce Dys, MS RD PA-C 5:19 PM

## 2018-06-17 NOTE — Progress Notes (Signed)
MRI done on this pt.  Pt unable to hold still and refused to continue for contrast.  Images that were obtained were completed out and order changed to without contrast only.

## 2018-06-17 NOTE — Progress Notes (Signed)
[facility]  Date of Admission:  06/08/2018   Total days of antibiotics 9        Day 9 cefazolin            Patient ID: Oscar Burke is a 37 y.o. M with      Principal Problem:   Endocarditis of tricuspid valve Active Problems:   Septic embolism (HCC)   Multifocal pneumonia   Hepatitis C, chronic (HCC)   Acute on chronic respiratory failure with hypoxia (HCC)   Polysubstance abuse (HCC)   Alcohol dependence with unspecified alcohol-induced disorder (HCC)   Abdominal pain   Tobacco abuse   Substance induced mood disorder (HCC)   . buprenorphine-naloxone  1 tablet Sublingual Daily  . enoxaparin (LOVENOX) injection  40 mg Subcutaneous Q24H  . iopamidol  20 mL Intra-articular Once  . mouth rinse  15 mL Mouth Rinse BID  . polyethylene glycol  17 g Oral Daily  . risperiDONE  1 mg Oral QHS  . senna  2 tablet Oral Daily    SUBJECTIVE: Shoulder pain still improved. Does have some chest pain with deep breaths and only able to breathe comfortably with shallow breaths. Up walking in the room a little.   No Known Allergies  OBJECTIVE: Vitals:   06/16/18 2122 06/17/18 0232 06/17/18 0518 06/17/18 0521  BP: 127/72   111/73  Pulse: 98   96  Resp:      Temp: 98.6 F (37 C) (!) 101.5 F (38.6 C)  98.4 F (36.9 C)  TempSrc: Oral Oral  Oral  SpO2: 95%   99%  Weight:   79.8 kg   Height:       Body mass index is 24.53 kg/m.  Physical Exam  Constitutional: He is oriented to person, place, and time. He appears well-developed. He appears lethargic.  Sitting comfortably sleeping in recliner  HENT:  Mouth/Throat: Oropharynx is clear and moist. No oropharyngeal exudate.  Poor dentition   Eyes: Pupils are equal, round, and reactive to light. No scleral icterus.  Dilated pupils   Cardiovascular: Normal rate and regular rhythm.  Murmur (systolic 2-3/6) heard. Pulmonary/Chest: Effort normal. No respiratory distress. He has rales. He exhibits  no tenderness.  Shallow inspiratory effort. Pain with deep inhalation.  Decreased b/l bases R>L   Abdominal: Soft. Bowel sounds are normal. He exhibits no distension.  Musculoskeletal: He exhibits edema (3+ edema up b/l LEs R slightly worse than L).  Lymphadenopathy:    He has no cervical adenopathy.  Neurological: He is oriented to person, place, and time. He appears lethargic.  Slurring words at times, lethargic  Skin: Skin is warm and dry. No rash noted. No erythema.  Psychiatric: His behavior is normal. Judgment normal.  Vitals reviewed. R Foot - wrapped and clean.    Lab Results Lab Results  Component Value Date   WBC 17.5 (H) 06/17/2018   HGB 7.3 (L) 06/17/2018   HCT 23.0 (L) 06/17/2018   MCV 89.1 06/17/2018   PLT 391 06/17/2018    Lab Results  Component Value Date   CREATININE 1.23 06/17/2018   BUN 23 (H) 06/17/2018   NA 131 (L) 06/17/2018   K 5.1 06/17/2018   CL 94 (L) 06/17/2018   CO2 31 06/17/2018    Lab Results  Component Value Date   ALT 13 06/16/2018   AST 49 (H) 06/16/2018   ALKPHOS 137 (H) 06/16/2018   BILITOT 0.7 06/16/2018  Microbiology: Recent Results (from the past 240 hour(s))  Blood Culture (routine x 2)     Status: Abnormal   Collection Time: 06/08/18  7:47 AM  Result Value Ref Range Status   Specimen Description   Final    BLOOD RIGHT ARM Performed at [facility], [address], [place], [place] [address]    Special Requests   Final    BOTTLES DRAWN AEROBIC AND ANAEROBIC Blood Culture results may not be optimal due to an excessive volume of blood received in culture bottles Performed at [facility], [address] [place], [place] [address]    Culture  Setup Time   Final    GRAM POSITIVE COCCI IN BOTH AEROBIC AND ANAEROBIC BOTTLES CRITICAL RESULT CALLED TO, READ BACK BY AND VERIFIED WITH: PHARMD N GLOGOVAC [phone] MLM    Culture (A)  Final    STAPHYLOCOCCUS AUREUS BACILLUS  SPECIES Standardized susceptibility testing for this organism is not available. CRITICAL RESULT CALLED TO, READ BACK BY AND VERIFIED WITH: PHARMD MEREDITH R [id number] FCP Performed at [facility], [address] [place], [place] [address]    Report Status 06/11/2018 FINAL  Final   Organism ID, Bacteria STAPHYLOCOCCUS AUREUS  Final      Susceptibility   Staphylococcus aureus - MIC*    CIPROFLOXACIN <=0.5 SENSITIVE Sensitive     ERYTHROMYCIN >=8 RESISTANT Resistant     GENTAMICIN <=0.5 SENSITIVE Sensitive     OXACILLIN 0.5 SENSITIVE Sensitive     TETRACYCLINE <=1 SENSITIVE Sensitive     VANCOMYCIN 1 SENSITIVE Sensitive     TRIMETH/SULFA <=10 SENSITIVE Sensitive     CLINDAMYCIN <=0.25 SENSITIVE Sensitive     RIFAMPIN <=0.5 SENSITIVE Sensitive     Inducible Clindamycin NEGATIVE Sensitive     * STAPHYLOCOCCUS AUREUS  Blood Culture ID Panel (Reflexed)     Status: Abnormal   Collection Time: 06/08/18  7:47 AM  Result Value Ref Range Status   Enterococcus species NOT DETECTED NOT DETECTED Final   Listeria monocytogenes NOT DETECTED NOT DETECTED Final   Staphylococcus species DETECTED (A) NOT DETECTED Final    Comment: CRITICAL RESULT CALLED TO, READ BACK BY AND VERIFIED WITH: PHARMD N GLOGOVAC [phone] MLM    Staphylococcus aureus (BCID) DETECTED (A) NOT DETECTED Final    Comment: Methicillin (oxacillin) susceptible Staphylococcus aureus (MSSA). Preferred therapy is anti staphylococcal beta lactam antibiotic (Cefazolin or Nafcillin), unless clinically contraindicated. CRITICAL RESULT CALLED TO, READ BACK BY AND VERIFIED WITH: PHARMD N GLOGOVAC [phone] MLM    Methicillin resistance NOT DETECTED NOT DETECTED Final   Streptococcus species NOT DETECTED NOT DETECTED Final   Streptococcus agalactiae NOT DETECTED NOT DETECTED Final   Streptococcus pneumoniae NOT DETECTED NOT DETECTED Final   Streptococcus pyogenes NOT DETECTED NOT DETECTED Final   Acinetobacter baumannii NOT  DETECTED NOT DETECTED Final   Enterobacteriaceae species NOT DETECTED NOT DETECTED Final   Enterobacter cloacae complex NOT DETECTED NOT DETECTED Final   Escherichia coli NOT DETECTED NOT DETECTED Final   Klebsiella oxytoca NOT DETECTED NOT DETECTED Final   Klebsiella pneumoniae NOT DETECTED NOT DETECTED Final   Proteus species NOT DETECTED NOT DETECTED Final   Serratia marcescens NOT DETECTED NOT DETECTED Final   Haemophilus influenzae NOT DETECTED NOT DETECTED Final   Neisseria meningitidis NOT DETECTED NOT DETECTED Final   Pseudomonas aeruginosa NOT DETECTED NOT DETECTED Final   Candida albicans NOT DETECTED NOT DETECTED Final   Candida glabrata NOT DETECTED  NOT DETECTED Final   Candida krusei NOT DETECTED NOT DETECTED Final   Candida parapsilosis NOT DETECTED NOT DETECTED Final   Candida tropicalis NOT DETECTED NOT DETECTED Final    Comment: Performed at [facility], [address], [place], [place] [address]  Blood Culture (routine x 2)     Status: Abnormal   Collection Time: 06/08/18  7:55 AM  Result Value Ref Range Status   Specimen Description   Final    BLOOD RIGHT WRIST Performed at [facility], [address] [place], [place] [address]    Special Requests   Final    BOTTLES DRAWN AEROBIC AND ANAEROBIC Blood Culture results may not be optimal due to an excessive volume of blood received in culture bottles Performed at [facility], [address], [place], [place] [address]    Culture  Setup Time   Final    GRAM POSITIVE COCCI IN BOTH AEROBIC AND ANAEROBIC BOTTLES CRITICAL VALUE NOTED.  VALUE IS CONSISTENT WITH PREVIOUSLY REPORTED AND CALLED VALUE.    Culture (A)  Final    STAPHYLOCOCCUS AUREUS SUSCEPTIBILITIES PERFORMED ON PREVIOUS CULTURE WITHIN THE LAST 5 DAYS. Performed at [facility], [address] [place], [place] [address]    Report Status 06/11/2018 FINAL  Final  Culture, sputum-assessment     Status:  None   Collection Time: 06/08/18  3:06 PM  Result Value Ref Range Status   Specimen Description EXPECTORATED SPUTUM  Final   Special Requests NONE  Final   Sputum evaluation   Final    THIS SPECIMEN IS ACCEPTABLE FOR SPUTUM CULTURE Performed at [facility], [address] [place], [place] [address]    Report Status 06/08/2018 FINAL  Final  Culture, respiratory     Status: None   Collection Time: 06/08/18  3:06 PM  Result Value Ref Range Status   Specimen Description   Final    EXPECTORATED SPUTUM Performed at [facility], [address] [place], [place] [address]    Special Requests   Final    NONE Reflexed from [id number] Performed at [facility], [address] [place], [place] [address]    Gram Stain   Final    ABUNDANT WBC PRESENT,BOTH PMN AND MONONUCLEAR FEW GRAM POSITIVE COCCI FEW GRAM NEGATIVE RODS FEW SQUAMOUS EPITHELIAL CELLS PRESENT Performed at [facility], [address], [place], [place] [address]    Culture FEW STAPHYLOCOCCUS AUREUS  Final   Report Status 06/11/2018 FINAL  Final   Organism ID, Bacteria STAPHYLOCOCCUS AUREUS  Final      Susceptibility   Staphylococcus aureus - MIC*    CIPROFLOXACIN <=0.5 SENSITIVE Sensitive     ERYTHROMYCIN >=8 RESISTANT Resistant     GENTAMICIN <=0.5 SENSITIVE Sensitive     OXACILLIN 0.5 SENSITIVE Sensitive     TETRACYCLINE <=1 SENSITIVE Sensitive     VANCOMYCIN 1 SENSITIVE Sensitive     TRIMETH/SULFA <=10 SENSITIVE Sensitive     CLINDAMYCIN <=0.25 SENSITIVE Sensitive     RIFAMPIN <=0.5 SENSITIVE Sensitive     Inducible Clindamycin NEGATIVE Sensitive     * FEW STAPHYLOCOCCUS AUREUS  MRSA PCR Screening     Status: None   Collection Time: 06/08/18  3:22 PM  Result Value Ref Range Status   MRSA by PCR NEGATIVE NEGATIVE Final    Comment:        The GeneXpert MRSA Assay (FDA approved for NASAL specimens only),  is one component of a comprehensive MRSA  colonization surveillance program. It is not intended to diagnose MRSA infection nor to guide or monitor treatment for MRSA infections. Performed at [facility], [address] [place], [place] [address]   Culture, blood (routine x 2)     Status: None   Collection Time: 06/10/18  3:14 AM  Result Value Ref Range Status   Specimen Description   Final    BLOOD LEFT ARM Performed at [facility], [address], [place], [place] [address]    Special Requests   Final    BOTTLES DRAWN AEROBIC ONLY Blood Culture adequate volume Performed at [facility], [address], [place], [place] [address]    Culture   Final    NO GROWTH 5 DAYS Performed at [facility], [address] [place], [place] [address]    Report Status 06/15/2018 FINAL  Final  Culture, blood (routine x 2)     Status: None   Collection Time: 06/10/18  3:14 AM  Result Value Ref Range Status   Specimen Description   Final    BLOOD LEFT HAND Performed at [facility], [address], [place], [place] [address]    Special Requests   Final    BOTTLES DRAWN AEROBIC ONLY Blood Culture results may not be optimal due to an inadequate volume of blood received in culture bottles Performed at [facility], [address], [place], [place] [address]    Culture   Final    NO GROWTH 5 DAYS Performed at [facility], [address], [place], [place] [address]    Report Status 06/15/2018 FINAL  Final  MRSA PCR Screening     Status: None   Collection Time: 06/13/18  7:55 PM  Result Value Ref Range Status   MRSA by PCR NEGATIVE NEGATIVE Final    Comment:        The GeneXpert MRSA Assay (FDA approved for NASAL specimens only), is one component of a comprehensive MRSA colonization surveillance program. It is not intended to diagnose MRSA infection nor to guide or monitor treatment for MRSA infections. Performed at  [facility], [address], [place], [place] [address]   Culture, blood (routine x 2)     Status: None (Preliminary result)   Collection Time: 06/15/18  9:10 AM  Result Value Ref Range Status   Specimen Description BLOOD LEFT ANTECUBITAL  Final   Special Requests   Final    BOTTLES DRAWN AEROBIC ONLY Blood Culture results may not be optimal due to an inadequate volume of blood received in culture bottles   Culture   Final    NO GROWTH < 24 HOURS Performed at [facility], [address] [place], [place] [address]    Report Status PENDING  Incomplete  Culture, blood (routine x 2)     Status: None (Preliminary result)   Collection Time: 06/15/18  9:20 AM  Result Value Ref Range Status   Specimen Description BLOOD LEFT ANTECUBITAL  Final   Special Requests   Final    BOTTLES DRAWN AEROBIC ONLY Blood Culture results may not be optimal due to an inadequate volume of blood received in culture bottles   Culture   Final    NO GROWTH < 24 HOURS Performed at [facility], [address] [place], [place] [address]    Report Status PENDING  Incomplete  Body fluid culture     Status: None (Preliminary result)   Collection Time: 06/16/18 11:16 AM  Result Value Ref Range Status   Specimen Description SYNOVIAL RIGHT SHOULDER  Final   Special Requests NONE  Final   Gram Stain   Final    RARE WBC PRESENT, PREDOMINANTLY PMN NO ORGANISMS SEEN CRITICAL RESULT CALLED TO, READ BACK BY AND VERIFIED WITH: DR JEFFERY AT 1157 ON [id number] BY SJW    Culture   Final    NO GROWTH < 24 HOURS Performed at [facility], [address] [place], [place] [address]    Report Status PENDING  Incomplete   Assessment:  37 y.o. 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 His blood cultures have cleared but he continues to have fevers > 102 F and elevated WBC count. Chest CT scan with bilateral cavitary/nodular disease c/w septic emboli with bilateral pleural  effusions that are likely exudative; unable to r/o empyema entirely. He does have pain with deep inspiration and inability to take a deep breath but overall non-toxic exam. He is quite sleepy so does not appear to be doing much. These need to be drained and are likely the source of ongoing fevers. Would consult IR.   Shoulder GS negative after 8d of antibiotics and culture negative so far. He does have a mention of possible subscapularis abscess; would ask ortho team to comment on this and need for intervention as this could also be the cause of ongoing fevers.   R Foot MRI w/o contrast appears c/w only diffuse cellulitis and no osteo or abscess.   Plan:  1. Would have IR evaluate for drainage of empyema/exudative pleural effusions 2. Continue Cefazolin  3. Ortho to weigh in on ?subscapularis abscess of the R shoulder   Rexene Alberts, MSN, NP-C [facility] Cell: [phone] Pager: [phone]  06/17/2018  11:23 AM

## 2018-06-17 NOTE — Progress Notes (Signed)
PROGRESS NOTE    Oscar Burke  WUJ:[id number] DOB: [birth year] DOA: 06/08/2018 PCP: Patient, No Pcp Per    Brief Narrative:  37 year old with past medical history relevant for IV heroin abuse, chronic hepatitis C who presented to the emergency department with fevers and body aches as well as whitish sputum and tachypnea and acute hypoxic respiratory failure and found to have septic pulmonary emboli and MSSA tricuspid valve endocarditis.   Assessment & Plan:   Principal Problem:   Endocarditis of tricuspid valve Active Problems:   Acute on chronic respiratory failure with hypoxia (HCC)   Polysubstance abuse (HCC)   Alcohol dependence with unspecified alcohol-induced disorder (HCC)   Abdominal pain   Hepatitis C, chronic (HCC)   Septic embolism (HCC)   Multifocal pneumonia   Tobacco abuse   Substance induced mood disorder (HCC)   Empyema lung (HCC)   #) MSSA tricuspid valve endocarditis complicated by septic emboli: Patient continues to have a fever. -Infectious diseases following, appreciate recommendations -MRI of thoracic and lumbar spine on 06/15/2018 shows no evidence of metastatic disease -MRI right shoulder on 06/15/2018 shows shoulder effusion and large abscess along subscapularis -Chest CT on 06/16/2018 shows septic emboli and empyema versus parapneumonic effusion -Foot MRI on 06/16/2018 shows evidence of soft tissue wound and tissue edema but no evidence of abscess or hematoma - Blood cultures from 06/08/2018 growing MSSA -Blood cultures from 06/10/2018 negative -Blood cultures from 06/15/2018 no growth to date -Shoulder arthrocentesis cultures from 06/16/2018 no growth to date -Continue IV cefazolin started 06/2018, per infectious disease if this fever is breakthrough than they would consider switching to daptomycin  - infectious disease feels that patient may require pigtail catheter for empyema due to persistent fevers, will discuss with interventional radiology  #)  Right shoulder effusion status post arthrocentesis on 06/16/2018 by interventional radiology: Noted on MRI on 06/15/2018 -Orthopedic surgery consulted, felt that with patient's movement intact effusion was more more likely to be reactive rather than septic arthritis - Interventional radiology consulted - Minimal fluid was obtained and so differential was not sent of only culture  #) Polysubstance abuse: -Continue Suboxone 1 tablet daily  #) Pain/psych: -Continue PRN cycle Benzapril clonazepam  #) Acute hypoxic respiratory failure: Likely secondary to septic emboli in the lungs.  This is resolved now  #) Chronic hep C: -Outpatient treatment  Fluids: Tolerating p.o. Electrodes: Monitor and supplement Nutrition: Regular diet  Prophylaxis: Enoxaparin  Disposition: Pending completion of IV antibiotics  Full code   Consultants:   Infectious disease  Cardio thoracic surgery  Cardiology  Procedures:  06/08/2018 echo:- Left ventricle: The cavity size was normal. Wall thickness was   normal. Systolic function was normal. The estimated ejection   fraction was in the range of 55% to 60%. Wall motion was normal;   there were no regional wall motion abnormalities. Left   ventricular diastolic function parameters were normal. - Ventricular septum: Very mildly D-shaped interventricular septum   is concerning for RV pressure/volume overload. - Aortic valve: There was no stenosis. - Mitral valve: There was no regurgitation. - Right ventricle: The cavity size was mildly dilated. Systolic   function was normal. - Right atrium: The atrium was mildly dilated. - Tricuspid valve: There was mild-moderate regurgitation. Suspected   tricuspid vegetation. Peak RV-RA gradient (S): 25 mm Hg. - Pulmonary arteries: PA peak pressure: 33 mm Hg (S). - Systemic veins: IVC measured 2.3 cm with > 50% respirophasic   variation, suggesting RA pressure 8 mmHg. - Pericardium, extracardiac:  Small  circumferential pericardial   effusion.  Impressions:  - Normal LV size and systolic function, EF 55-60%. Normal diastolic   function. Mildly D-shaped interventricular septum suggesting a   degree of RV pressure/volume overload. Mildly dilated RV with   normal systolic function. Mild to moderate TR. There does appear    to be a tricuspid valve vegetation. Need TEE to confirm.   06/13/2018 TEE:- Left ventricle: Systolic function was normal. The estimated   ejection fraction was in the range of 60% to 65%. - Aortic valve: No evidence of vegetation. - Mitral valve: No evidence of vegetation. - Left atrium: No evidence of thrombus in the atrial cavity or   appendage. - Right atrium: No evidence of thrombus in the atrial cavity or   appendage. - Atrial septum: No defect or patent foramen ovale was identified. - Tricuspid valve: Large vegetation on the lateral leaflet of the   TV measuring 2 cm in longest dimension. There was moderate-severe   regurgitation. - Pulmonic valve: No evidence of vegetation.  Antimicrobials:   IV cefazolin started 06/08/2018   Subjective: Patient is more awake today.  He reports his shoulder pain only is particularly bad when he moves it.  He denies any nausea, vomiting, diarrhea, cough, congestion.  Objective: Vitals:   06/16/18 2122 06/17/18 0232 06/17/18 0518 06/17/18 0521  BP: 127/72   111/73  Pulse: 98   96  Resp:      Temp: 98.6 F (37 C) (!) 101.5 F (38.6 C)  98.4 F (36.9 C)  TempSrc: Oral Oral  Oral  SpO2: 95%   99%  Weight:   79.8 kg   Height:        Intake/Output Summary (Last 24 hours) at 06/17/2018 1321 Last data filed at 06/17/2018 1245 Gross per 24 hour  Intake 720 ml  Output 1525 ml  Net -805 ml   Filed Weights   06/15/18 0500 06/16/18 0606 06/17/18 0518  Weight: 79.5 kg 79.2 kg 79.8 kg    Examination:  General exam: Appears calm and comfortable  Respiratory system: Clear to auscultation. Respiratory effort  normal.  Diminished lung sounds at bases Cardiovascular system: Regular rate and rhythm, 2 out of 6 systolic murmur heard best at left upper sternal border Gastrointestinal system: Abdomen is nondistended, soft and nontender. No organomegaly or masses felt. Normal bowel sounds heard. Central nervous system: Alert and oriented. No focal neurological deficits. Extremities: No lower extremity edema.  No midline tenderness over back and shoulder on right Skin: Healed injection site Psychiatry: Judgement and insight appear poor. Mood & affect appropriate.     Data Reviewed: I have personally reviewed following labs and imaging studies  CBC: Recent Labs  Lab 06/12/18 0343 06/13/18 0328 06/15/18 0423 06/16/18 0341 06/17/18 0534  WBC 17.5* 18.9* 17.8* 16.5* 17.5*  HGB 8.9* 8.2* 8.4* 7.3* 7.3*  HCT 26.4* 24.7* 25.9* 23.4* 23.0*  MCV 87.4 87.6 88.1 89.0 89.1  PLT 194 258 324 342 391   Basic Metabolic Panel: Recent Labs  Lab 06/13/18 0328 06/13/18 1935 06/15/18 0423 06/16/18 0341 06/17/18 0534  NA 127* 126* 127* 129* 131*  K 4.1 4.3 4.5 4.9 5.1  CL 90* 89* 91* 95* 94*  CO2 29 28 29 29 31   GLUCOSE 110* 99 114* 116* 113*  BUN 20 22* 29* 29* 23*  CREATININE 1.03 1.20 1.33* 1.37* 1.23  CALCIUM 7.3* 7.3* 7.6* 7.7* 8.0*  MG  --   --   --  2.0 1.8  GFR: Estimated Creatinine Clearance: 85.9 mL/min (by C-G formula based on SCr of 1.23 mg/dL). Liver Function Tests: Recent Labs  Lab 06/12/18 0951 06/15/18 0423 06/16/18 0341  AST  --  46* 49*  ALT  --  11 13  ALKPHOS  --  136* 137*  BILITOT 1.6* 0.8 0.7  PROT  --  5.9* 5.6*  ALBUMIN  --  1.3* 1.3*   No results for input(s): LIPASE, AMYLASE in the last 168 hours. No results for input(s): AMMONIA in the last 168 hours. Coagulation Profile: No results for input(s): INR, PROTIME in the last 168 hours. Cardiac Enzymes: No results for input(s): CKTOTAL, CKMB, CKMBINDEX, TROPONINI in the last 168 hours. BNP (last 3 results) No  results for input(s): PROBNP in the last 8760 hours. HbA1C: No results for input(s): HGBA1C in the last 72 hours. CBG: No results for input(s): GLUCAP in the last 168 hours. Lipid Profile: No results for input(s): CHOL, HDL, LDLCALC, TRIG, CHOLHDL, LDLDIRECT in the last 72 hours. Thyroid Function Tests: No results for input(s): TSH, T4TOTAL, FREET4, T3FREE, THYROIDAB in the last 72 hours. Anemia Panel: No results for input(s): VITAMINB12, FOLATE, FERRITIN, TIBC, IRON, RETICCTPCT in the last 72 hours. Sepsis Labs: No results for input(s): PROCALCITON, LATICACIDVEN in the last 168 hours.  Recent Results (from the past 240 hour(s))  Blood Culture (routine x 2)     Status: Abnormal   Collection Time: 06/08/18  7:47 AM  Result Value Ref Range Status   Specimen Description   Final    BLOOD RIGHT ARM Performed at [facility], [address], [place], [place] [address]    Special Requests   Final    BOTTLES DRAWN AEROBIC AND ANAEROBIC Blood Culture results may not be optimal due to an excessive volume of blood received in culture bottles Performed at [facility], [address] [place], [place] [address]    Culture  Setup Time   Final    GRAM POSITIVE COCCI IN BOTH AEROBIC AND ANAEROBIC BOTTLES CRITICAL RESULT CALLED TO, READ BACK BY AND VERIFIED WITH: PHARMD N GLOGOVAC [phone] MLM    Culture (A)  Final    STAPHYLOCOCCUS AUREUS BACILLUS SPECIES Standardized susceptibility testing for this organism is not available. CRITICAL RESULT CALLED TO, READ BACK BY AND VERIFIED WITH: PHARMD MEREDITH R [id number] FCP Performed at [facility], [address], [place], [place] [address]    Report Status 06/11/2018 FINAL  Final   Organism ID, Bacteria STAPHYLOCOCCUS AUREUS  Final      Susceptibility   Staphylococcus aureus - MIC*    CIPROFLOXACIN <=0.5 SENSITIVE Sensitive     ERYTHROMYCIN >=8 RESISTANT Resistant     GENTAMICIN <=0.5 SENSITIVE  Sensitive     OXACILLIN 0.5 SENSITIVE Sensitive     TETRACYCLINE <=1 SENSITIVE Sensitive     VANCOMYCIN 1 SENSITIVE Sensitive     TRIMETH/SULFA <=10 SENSITIVE Sensitive     CLINDAMYCIN <=0.25 SENSITIVE Sensitive     RIFAMPIN <=0.5 SENSITIVE Sensitive     Inducible Clindamycin NEGATIVE Sensitive     * STAPHYLOCOCCUS AUREUS  Blood Culture ID Panel (Reflexed)     Status: Abnormal   Collection Time: 06/08/18  7:47 AM  Result Value Ref Range Status   Enterococcus species NOT DETECTED NOT DETECTED Final   Listeria monocytogenes NOT DETECTED NOT DETECTED Final   Staphylococcus species DETECTED (A) NOT DETECTED Final    Comment: CRITICAL RESULT CALLED TO, READ BACK BY AND VERIFIED  WITH: [employer] GLOGOVAC [phone] MLM    Staphylococcus aureus (BCID) DETECTED (A) NOT DETECTED Final    Comment: Methicillin (oxacillin) susceptible Staphylococcus aureus (MSSA). Preferred therapy is anti staphylococcal beta lactam antibiotic (Cefazolin or Nafcillin), unless clinically contraindicated. CRITICAL RESULT CALLED TO, READ BACK BY AND VERIFIED WITH: PHARMD N GLOGOVAC [phone] MLM    Methicillin resistance NOT DETECTED NOT DETECTED Final   Streptococcus species NOT DETECTED NOT DETECTED Final   Streptococcus agalactiae NOT DETECTED NOT DETECTED Final   Streptococcus pneumoniae NOT DETECTED NOT DETECTED Final   Streptococcus pyogenes NOT DETECTED NOT DETECTED Final   Acinetobacter baumannii NOT DETECTED NOT DETECTED Final   Enterobacteriaceae species NOT DETECTED NOT DETECTED Final   Enterobacter cloacae complex NOT DETECTED NOT DETECTED Final   Escherichia coli NOT DETECTED NOT DETECTED Final   Klebsiella oxytoca NOT DETECTED NOT DETECTED Final   Klebsiella pneumoniae NOT DETECTED NOT DETECTED Final   Proteus species NOT DETECTED NOT DETECTED Final   Serratia marcescens NOT DETECTED NOT DETECTED Final   Haemophilus influenzae NOT DETECTED NOT DETECTED Final   Neisseria meningitidis NOT DETECTED  NOT DETECTED Final   Pseudomonas aeruginosa NOT DETECTED NOT DETECTED Final   Candida albicans NOT DETECTED NOT DETECTED Final   Candida glabrata NOT DETECTED NOT DETECTED Final   Candida krusei NOT DETECTED NOT DETECTED Final   Candida parapsilosis NOT DETECTED NOT DETECTED Final   Candida tropicalis NOT DETECTED NOT DETECTED Final    Comment: Performed at [facility], [address], [place], [place] [address]  Blood Culture (routine x 2)     Status: Abnormal   Collection Time: 06/08/18  7:55 AM  Result Value Ref Range Status   Specimen Description   Final    BLOOD RIGHT WRIST Performed at [facility] [address], [place], [place] [address]    Special Requests   Final    BOTTLES DRAWN AEROBIC AND ANAEROBIC Blood Culture results may not be optimal due to an excessive volume of blood received in culture bottles Performed at [facility], [address] [place], [place] [address]    Culture  Setup Time   Final    GRAM POSITIVE COCCI IN BOTH AEROBIC AND ANAEROBIC BOTTLES CRITICAL VALUE NOTED.  VALUE IS CONSISTENT WITH PREVIOUSLY REPORTED AND CALLED VALUE.    Culture (A)  Final    STAPHYLOCOCCUS AUREUS SUSCEPTIBILITIES PERFORMED ON PREVIOUS CULTURE WITHIN THE LAST 5 DAYS. Performed at [facility], [address], [place], [place] [address]    Report Status 06/11/2018 FINAL  Final  Culture, sputum-assessment     Status: None   Collection Time: 06/08/18  3:06 PM  Result Value Ref Range Status   Specimen Description EXPECTORATED SPUTUM  Final   Special Requests NONE  Final   Sputum evaluation   Final    THIS SPECIMEN IS ACCEPTABLE FOR SPUTUM CULTURE Performed at [facility], [address], [place], [place] [address]    Report Status 06/08/2018 FINAL  Final  Culture, respiratory     Status: None   Collection Time: 06/08/18  3:06 PM  Result Value Ref Range Status   Specimen Description   Final     EXPECTORATED SPUTUM Performed at [facility], [address], [place], [place] [address]    Special Requests   Final    NONE Reflexed from [id number] Performed at [facility], [address], [place], [place] [address]  Gram Stain   Final    ABUNDANT WBC PRESENT,BOTH PMN AND MONONUCLEAR FEW GRAM POSITIVE COCCI FEW GRAM NEGATIVE RODS FEW SQUAMOUS EPITHELIAL CELLS PRESENT Performed at [facility], [address], [place], [place] [address]    Culture FEW STAPHYLOCOCCUS AUREUS  Final   Report Status 06/11/2018 FINAL  Final   Organism ID, Bacteria STAPHYLOCOCCUS AUREUS  Final      Susceptibility   Staphylococcus aureus - MIC*    CIPROFLOXACIN <=0.5 SENSITIVE Sensitive     ERYTHROMYCIN >=8 RESISTANT Resistant     GENTAMICIN <=0.5 SENSITIVE Sensitive     OXACILLIN 0.5 SENSITIVE Sensitive     TETRACYCLINE <=1 SENSITIVE Sensitive     VANCOMYCIN 1 SENSITIVE Sensitive     TRIMETH/SULFA <=10 SENSITIVE Sensitive     CLINDAMYCIN <=0.25 SENSITIVE Sensitive     RIFAMPIN <=0.5 SENSITIVE Sensitive     Inducible Clindamycin NEGATIVE Sensitive     * FEW STAPHYLOCOCCUS AUREUS  MRSA PCR Screening     Status: None   Collection Time: 06/08/18  3:22 PM  Result Value Ref Range Status   MRSA by PCR NEGATIVE NEGATIVE Final    Comment:        The GeneXpert MRSA Assay (FDA approved for NASAL specimens only), is one component of a comprehensive MRSA colonization surveillance program. It is not intended to diagnose MRSA infection nor to guide or monitor treatment for MRSA infections. Performed at [facility], [address] [place], [place] [address]   Culture, blood (routine x 2)     Status: None   Collection Time: 06/10/18  3:14 AM  Result Value Ref Range Status   Specimen Description   Final    BLOOD LEFT ARM Performed at [facility], [address] [place], [place] [address]    Special Requests   Final     BOTTLES DRAWN AEROBIC ONLY Blood Culture adequate volume Performed at [facility], [address], [place], [place] [address]    Culture   Final    NO GROWTH 5 DAYS Performed at [facility], [address] [place], [place] [address]    Report Status 06/15/2018 FINAL  Final  Culture, blood (routine x 2)     Status: None   Collection Time: 06/10/18  3:14 AM  Result Value Ref Range Status   Specimen Description   Final    BLOOD LEFT HAND Performed at [facility], [address] [place], [place] [address]    Special Requests   Final    BOTTLES DRAWN AEROBIC ONLY Blood Culture results may not be optimal due to an inadequate volume of blood received in culture bottles Performed at [facility], [address] [place], [place] [address]    Culture   Final    NO GROWTH 5 DAYS Performed at [facility], [address], [place], [place] [address]    Report Status 06/15/2018 FINAL  Final  MRSA PCR Screening     Status: None   Collection Time: 06/13/18  7:55 PM  Result Value Ref Range Status   MRSA by PCR NEGATIVE NEGATIVE Final    Comment:        The GeneXpert MRSA Assay (FDA approved for NASAL specimens only), is one component of a comprehensive MRSA colonization surveillance program. It is not intended to diagnose MRSA infection nor to guide or monitor treatment for MRSA infections. Performed at [facility],  [address] [place], [place] [address]   Culture, blood (routine x 2)     Status: None (Preliminary result)   Collection Time: 06/15/18  9:10 AM  Result Value Ref Range Status   Specimen Description BLOOD LEFT ANTECUBITAL  Final   Special Requests   Final    BOTTLES DRAWN AEROBIC ONLY Blood Culture results may not be optimal due to an inadequate volume of blood received in culture bottles   Culture   Final    NO GROWTH < 24 HOURS Performed at [facility], [address]  [place], [place] [address]    Report Status PENDING  Incomplete  Culture, blood (routine x 2)     Status: None (Preliminary result)   Collection Time: 06/15/18  9:20 AM  Result Value Ref Range Status   Specimen Description BLOOD LEFT ANTECUBITAL  Final   Special Requests   Final    BOTTLES DRAWN AEROBIC ONLY Blood Culture results may not be optimal due to an inadequate volume of blood received in culture bottles   Culture   Final    NO GROWTH < 24 HOURS Performed at [facility], [address], [place], [place] [address]    Report Status PENDING  Incomplete  Body fluid culture     Status: None (Preliminary result)   Collection Time: 06/16/18 11:16 AM  Result Value Ref Range Status   Specimen Description SYNOVIAL RIGHT SHOULDER  Final   Special Requests NONE  Final   Gram Stain   Final    RARE WBC PRESENT, PREDOMINANTLY PMN NO ORGANISMS SEEN CRITICAL RESULT CALLED TO, READ BACK BY AND VERIFIED WITH: DR JEFFERY AT 1157 ON [id number] BY SJW    Culture   Final    NO GROWTH < 24 HOURS Performed at [facility], [address] [place], [place] [address]    Report Status PENDING  Incomplete         Radiology Studies: Ct Chest W Contrast  Result Date: 06/17/2018 CLINICAL DATA:  Right chest pain and shortness of breath. IV drug abuse. Septic emboli. EXAM: CT CHEST WITH CONTRAST TECHNIQUE: Multidetector CT imaging of the chest was performed during intravenous contrast administration. CONTRAST:  75mL OMNIPAQUE IOHEXOL 300 MG/ML  SOLN COMPARISON:  Thoracic spine CT from 06/15/2018 FINDINGS: Cardiovascular: Mild cardiomegaly. Mediastinum/Nodes: Supraclavicular adenopathy including a 1.6 cm right supraclavicular node on image 19/3. Indistinct subcarinal adenopathy approximately 1.4 cm in diameter. Indistinct hilar adenopathy with a right hilar node at about 1.5 cm in short axis on image 71/3. Lungs/Pleura: Numerous cavitary nodules are present throughout both lungs. Many of these are  confluent with other cavitary nodules. Some of the nodules are not overtly cavitary. Index confluence of cavitary nodules at the right lung apex measures 4.1 by 3.5 cm. Moderate-sized bilateral pleural effusions are present. There is likely some mild pleural enhancement along with some pleural loculation more notably on the left, exudative effusions or empyema not excluded. Upper Abdomen: Unremarkable Musculoskeletal: On recent right shoulder MRI, there was an abnormal fluid collection tracking along the right subscapularis muscle laterally. This is partially seen on image 39/3, and may measure up to about 8.8 by 3.0 by 4.3 cm. This may well be an abscess within or along the lateral right subscapularis muscle. IMPRESSION: 1. Numerous cavitary nodules in both lungs favoring septic emboli in this setting. Bilateral pleural effusions are likely exudative and empyema is not excluded. 2. Abnormal low-density process involving the lateral portion of  the right subscapularis muscle suspicious for abscess. 3. Reactive adenopathy including a 1.6 cm right supraclavicular lymph node. 4. Mild cardiomegaly. Electronically Signed   By: Gaylyn Rong M.D.   On: 06/17/2018 01:42   Mr Thoracic Spine Wo Contrast  Result Date: 06/15/2018 CLINICAL DATA:  Back pain with concern for epidural abscess. IV drug use. EXAM: MRI THORACIC AND LUMBAR SPINE WITHOUT CONTRAST TECHNIQUE: Multiplanar and multiecho pulse sequences of the thoracic and lumbar spine were obtained without intravenous contrast. COMPARISON:  CT abdomen pelvis 06/08/2018 FINDINGS: The study is degraded by motion, despite efforts to reduce this artifact. The findings of the study are interpreted in the context of reduced sensitivity/specificity. MRI THORACIC SPINE FINDINGS Alignment:  Physiologic. Vertebrae: No fracture, evidence of discitis, or bone lesion. Cord:  Normal signal and morphology. Paraspinal and other soft tissues: Multiple areas of consolidation within  both lungs. Disc levels: No disc herniation or spinal canal stenosis. MRI LUMBAR SPINE FINDINGS Segmentation:  Standard. Alignment:  Physiologic. Vertebrae:  No fracture, evidence of discitis, or bone lesion. Conus medullaris and cauda equina: Conus extends to the L1 level. Conus and cauda equina appear normal. Paraspinal and other soft tissues: Negative Disc levels: The L1-2 and L2-3 disc levels are normal. L3-L4: Mild facet hypertrophy without stenosis. L4-L5: Diffuse disc bulge, left eccentric, with mild bilateral facet hypertrophy. There is narrowing of the left lateral recess and moderate left neural foraminal stenosis. L5-S1: No disc herniation or stenosis. IMPRESSION: MR THORACIC SPINE IMPRESSION 1. No discitis-osteomyelitis or epidural abscess. 2. Multifocal cavitary consolidation within both lungs. MR LUMBAR SPINE IMPRESSION 1. No discitis-osteomyelitis or epidural abscess. 2. Lower lumbar degenerative disc disease, worst at L4-L5 where there is narrowing of the left lateral recess and moderate left neural foraminal stenosis. Electronically Signed   By: Deatra Soderman M.D.   On: 06/15/2018 21:51   Mr Lumbar Spine Wo Contrast  Result Date: 06/15/2018 CLINICAL DATA:  Back pain with concern for epidural abscess. IV drug use. EXAM: MRI THORACIC AND LUMBAR SPINE WITHOUT CONTRAST TECHNIQUE: Multiplanar and multiecho pulse sequences of the thoracic and lumbar spine were obtained without intravenous contrast. COMPARISON:  CT abdomen pelvis 06/08/2018 FINDINGS: The study is degraded by motion, despite efforts to reduce this artifact. The findings of the study are interpreted in the context of reduced sensitivity/specificity. MRI THORACIC SPINE FINDINGS Alignment:  Physiologic. Vertebrae: No fracture, evidence of discitis, or bone lesion. Cord:  Normal signal and morphology. Paraspinal and other soft tissues: Multiple areas of consolidation within both lungs. Disc levels: No disc herniation or spinal canal  stenosis. MRI LUMBAR SPINE FINDINGS Segmentation:  Standard. Alignment:  Physiologic. Vertebrae:  No fracture, evidence of discitis, or bone lesion. Conus medullaris and cauda equina: Conus extends to the L1 level. Conus and cauda equina appear normal. Paraspinal and other soft tissues: Negative Disc levels: The L1-2 and L2-3 disc levels are normal. L3-L4: Mild facet hypertrophy without stenosis. L4-L5: Diffuse disc bulge, left eccentric, with mild bilateral facet hypertrophy. There is narrowing of the left lateral recess and moderate left neural foraminal stenosis. L5-S1: No disc herniation or stenosis. IMPRESSION: MR THORACIC SPINE IMPRESSION 1. No discitis-osteomyelitis or epidural abscess. 2. Multifocal cavitary consolidation within both lungs. MR LUMBAR SPINE IMPRESSION 1. No discitis-osteomyelitis or epidural abscess. 2. Lower lumbar degenerative disc disease, worst at L4-L5 where there is narrowing of the left lateral recess and moderate left neural foraminal stenosis. Electronically Signed   By: Deatra Fullwood M.D.   On: 06/15/2018 21:51   Mr  Shoulder Right Wo Contrast  Result Date: 06/15/2018 CLINICAL DATA:  Septic emboli, possible septic arthritis of the shoulder. IV drug abuse. EXAM: MRI OF THE RIGHT SHOULDER WITHOUT CONTRAST TECHNIQUE: Multiplanar, multisequence MR imaging of the shoulder was performed. No intravenous contrast was administered. COMPARISON:  Known FINDINGS: Despite efforts by the technologist and patient, motion artifact is present on today's exam and could not be eliminated. This reduces exam sensitivity and specificity. Rotator cuff:  Unremarkable Muscles: We partially image a large septated fluid collection anterior to the scapula partially surrounding and deep to the subscapularis muscle as on image 20/9. This is probably separate from the adjacent subcoracoid bursitis and distended subscapular recess. I am suspicious that this is an abscess within or along the subscapularis  muscle. Biceps long head:  Unremarkable Acromioclavicular Joint: No arthropathy. Type II acromion. There is only trace fluid in the subacromial subdeltoid bursa. Glenohumeral Joint: Moderate glenohumeral joint effusion. This mildly distends the axillary pouch. Labrum:  Indeterminate due to motion artifact. Bones:  No osseous edema to suggest osteomyelitis. Other: Low-level edema signal between the supraspinatus and trapezius muscles on image 11/6. IMPRESSION: 1. Large likely septated fluid collection along the margins of the subscapularis muscle, primarily between the subscapularis and the scapula, favoring abscess. This is only partially included on today's exam. 2. There is an adjacent joint effusion. Septic joint is likely although this could conceivably be reactive. An anterior needle approach to the joint with probably extend through the abscess for through some of the surrounding phlegmon. 3. Low-level infiltrative edema between the supraspinatus and trapezius muscles. Electronically Signed   By: Gaylyn Rong M.D.   On: 06/15/2018 23:07   Mr Foot Right Wo Contrast  Result Date: 06/17/2018 CLINICAL DATA:  Draining wound. Evaluate for osteomyelitis. Wound is along the plantar aspect of the foot. EXAM: MRI OF THE RIGHT FOREFOOT WITHOUT CONTRAST TECHNIQUE: Multiplanar, multisequence MR imaging of the right foot was performed. No intravenous contrast was administered. COMPARISON:  None. FINDINGS: Bones/Joint/Cartilage No marrow signal abnormality. No fracture or dislocation. Normal alignment. No joint effusion. No periosteal reaction or bone destruction. No aggressive osseous lesion. Ligaments Collateral ligaments are intact.  Lisfranc ligament is intact. Muscles and Tendons Flexor, peroneal and extensor compartment tendons are intact. Muscles are normal. Soft tissue Soft tissue wound along the plantar aspect of the midfoot. No adjacent fluid collection or hematoma. Mild soft tissue edema along the  forefoot which may be secondary to cellulitis versus reactive edema secondary to venous stasis or fluid overload. No soft tissue mass. IMPRESSION: 1. No osteomyelitis of the right forefoot. 2. Soft tissue wound along the plantar aspect of the midfoot. No fluid collection to suggest an abscess or hematoma. 3. Mild soft tissue edema along the forefoot which may be secondary to cellulitis versus reactive edema secondary to venous stasis or fluid overload. Electronically Signed   By: Elige Ko   On: 06/17/2018 08:21   Dg Fluoro Guided Needle Plc Aspiration/injection Loc  Result Date: 06/16/2018 CLINICAL DATA:  Right shoulder effusion with clinical suspicion of septic joint. EXAM: RIGHT SHOULDER ASPIRATION UNDER FLUOROSCOPY TECHNIQUE: An appropriate skin entrance site was determined. The site was marked, prepped with Betadine, draped in the usual sterile fashion, and infiltrated locally with buffered Lidocaine. 22 gauge spinal needle was advanced to the medial margin of the humeral head under intermittent fluoroscopy. 1 ml of Lidocaine injected easily. A mixture of 2 cc Isovue and saline was then used to opacify the right shoulder capsule. Aspiration was  then performed yielding approximately 1 cc of serosanguineous fluid, which was sent to microbiology. No immediate complication. FLUOROSCOPY TIME:  Fluoroscopy Time:  18 seconds. Number of Acquired Spot Images: 1 FINDINGS: Normal alignment of the glenohumeral joint. No osseous erosive lesions noted. A mixture of Isovue/saline was easily injected into the right glenohumeral joint. Scant amount of serosanguineous fluid was aspirated and sent to microbiology. IMPRESSION: Technically successful right shoulder diagnostic aspiration. Electronically Signed   By: Ted Mcalpine M.D.   On: 06/16/2018 11:38        Scheduled Meds: . buprenorphine-naloxone  1 tablet Sublingual Daily  . enoxaparin (LOVENOX) injection  40 mg Subcutaneous Q24H  . iopamidol  20 mL  Intra-articular Once  . mouth rinse  15 mL Mouth Rinse BID  . polyethylene glycol  17 g Oral Daily  . risperiDONE  1 mg Oral QHS  . senna  2 tablet Oral Daily   Continuous Infusions: . sodium chloride 250 mL (06/15/18 1730)  .  ceFAZolin (ANCEF) IV 2 g (06/17/18 0836)     LOS: 9 days    Time spent: 35    Delaine Lame, MD [facility]  If 7PM-7AM, please contact night-coverage [email or web address] Password [email or web address] 06/17/2018, 1:21 PM

## 2018-06-18 DIAGNOSIS — B9561 Methicillin susceptible Staphylococcus aureus infection as the cause of diseases classified elsewhere: Secondary | ICD-10-CM

## 2018-06-18 DIAGNOSIS — R7881 Bacteremia: Secondary | ICD-10-CM

## 2018-06-18 LAB — BASIC METABOLIC PANEL WITH GFR
Anion gap: 5 (ref 5–15)
Chloride: 95 mmol/L — ABNORMAL LOW (ref 98–111)
GFR calc Af Amer: >60 mL/min (ref >60)

## 2018-06-18 LAB — CBC
HCT: 22.4 % — ABNORMAL LOW (ref 39.0–52.0)
Hemoglobin: 7 g/dL — ABNORMAL LOW (ref 13.0–17.0)
MCH: 28 pg (ref 26.0–34.0)
MCHC: 31.3 g/dL (ref 30.0–36.0)
MCV: 89.6 fL (ref 80.0–100.0)
Platelets: 406 10*3/uL — ABNORMAL HIGH (ref 150–400)
RBC: 2.5 MIL/uL — ABNORMAL LOW (ref 4.22–5.81)
RDW: 13.6 % (ref 11.5–15.5)
WBC: 15.8 10*3/uL — ABNORMAL HIGH (ref 4.0–10.5)
nRBC: 0 % (ref 0.0–0.2)

## 2018-06-18 LAB — BASIC METABOLIC PANEL
BUN: 19 mg/dL (ref 6–20)
CO2: 30 mmol/L (ref 22–32)
Calcium: 7.9 mg/dL — ABNORMAL LOW (ref 8.9–10.3)
Creatinine, Ser: 1.15 mg/dL (ref 0.61–1.24)
GFR calc non Af Amer: >60 mL/min (ref >60)
Glucose, Bld: 99 mg/dL (ref 70–99)
Potassium: 5.3 mmol/L — ABNORMAL HIGH (ref 3.5–5.1)
Sodium: 130 mmol/L — ABNORMAL LOW (ref 135–145)

## 2018-06-18 LAB — LACTATE DEHYDROGENASE: LDH: 184 U/L (ref 98–192)

## 2018-06-18 LAB — PROTEIN, TOTAL: Total Protein: 5.5 g/dL — ABNORMAL LOW (ref 6.5–8.1)

## 2018-06-18 LAB — PREPARE RBC (CROSSMATCH)

## 2018-06-18 LAB — ABO/RH: ABO/RH(D): A POS

## 2018-06-18 MED ORDER — SODIUM CHLORIDE 0.9% IV SOLUTION
Freq: Once | INTRAVENOUS | Status: AC
  Administered 2018-06-18: 12:00:00 via INTRAVENOUS

## 2018-06-18 NOTE — Progress Notes (Signed)
INFECTIOUS DISEASE PROGRESS NOTE  ID: Oscar Burke is a 37 y.o. male with  Principal Problem:   Endocarditis of tricuspid valve Active Problems:   Acute on chronic respiratory failure with hypoxia (HCC)   Polysubstance abuse (HCC)   Alcohol dependence with unspecified alcohol-induced disorder (HCC)   Abdominal pain   Hepatitis C, chronic (HCC)   Septic embolism (HCC)   Multifocal pneumonia   Tobacco abuse   Substance induced mood disorder (HCC)   Empyema lung (HCC)  Subjective: No distress.   Abtx:  Anti-infectives (From admission, onward)   Start     Dose/Rate Route Frequency Ordered Stop   06/09/18 1400  ceFAZolin (ANCEF) IVPB 2g/100 mL premix     2 g 200 mL/hr over 30 Minutes Intravenous Every 8 hours 06/09/18 0920     06/08/18 1800  vancomycin (VANCOCIN) IVPB 750 mg/150 ml premix  Status:  Discontinued     750 mg 150 mL/hr over 60 Minutes Intravenous Every 8 hours 06/08/18 1448 06/09/18 0920   06/08/18 1445  ceFEPIme (MAXIPIME) 1 g in sodium chloride 0.9 % 100 mL IVPB  Status:  Discontinued     1 g 200 mL/hr over 30 Minutes Intravenous Every 8 hours 06/08/18 1436 06/09/18 0920   06/08/18 0845  vancomycin (VANCOCIN) 1,500 mg in sodium chloride 0.9 % 500 mL IVPB     1,500 mg 250 mL/hr over 120 Minutes Intravenous  Once 06/08/18 0828 06/08/18 1222   06/08/18 0830  ceFEPIme (MAXIPIME) 2 g in sodium chloride 0.9 % 100 mL IVPB     2 g 200 mL/hr over 30 Minutes Intravenous  Once 06/08/18 0825 06/08/18 0915   06/08/18 0830  metroNIDAZOLE (FLAGYL) IVPB 500 mg  Status:  Discontinued     500 mg 100 mL/hr over 60 Minutes Intravenous Every 8 hours 06/08/18 0825 06/08/18 2103   06/08/18 0830  vancomycin (VANCOCIN) IVPB 1000 mg/200 mL premix  Status:  Discontinued     1,000 mg 200 mL/hr over 60 Minutes Intravenous  Once 06/08/18 0825 06/08/18 0827      Medications:  Scheduled: . buprenorphine-naloxone  1 tablet Sublingual Daily  . enoxaparin (LOVENOX) injection  40 mg  Subcutaneous Q24H  . iopamidol  20 mL Intra-articular Once  . mouth rinse  15 mL Mouth Rinse BID  . polyethylene glycol  17 g Oral Daily  . risperiDONE  1 mg Oral QHS  . senna  2 tablet Oral Daily    Objective: Vital signs in last 24 hours: Temp:  [98.4 F (36.9 C)-101.9 F (38.8 C)] 99.6 F (37.6 C) (10/12 1155) Pulse Rate:  [92-117] 99 (10/12 1155) Resp:  [18-36] 36 (10/12 1130) BP: (122-135)/(75-87) 124/83 (10/12 1155) SpO2:  [93 %-100 %] 96 % (10/12 1155) Weight:  [79.5 kg] 79.5 kg (10/12 0451)   General appearance: alert, cooperative and no distress  Lab Results Recent Labs    06/17/18 0534 06/18/18 0435  WBC 17.5* 15.8*  HGB 7.3* 7.0*  HCT 23.0* 22.4*  NA 131* 130*  K 5.1 5.3*  CL 94* 95*  CO2 31 30  BUN 23* 19  CREATININE 1.23 1.15   Liver Panel Recent Labs    06/16/18 0341 06/18/18 1038  PROT 5.6* 5.5*  ALBUMIN 1.3*  --   AST 49*  --   ALT 13  --   ALKPHOS 137*  --   BILITOT 0.7  --    Sedimentation Rate No results for input(s): ESRSEDRATE in the last 72 hours. C-Reactive Protein  No results for input(s): CRP in the last 72 hours.  Microbiology: Recent Results (from the past 240 hour(s))  Culture, sputum-assessment     Status: None   Collection Time: 06/08/18  3:06 PM  Result Value Ref Range Status   Specimen Description EXPECTORATED SPUTUM  Final   Special Requests NONE  Final   Sputum evaluation   Final    THIS SPECIMEN IS ACCEPTABLE FOR SPUTUM CULTURE Performed at [facility], [address] [place], [place] [address]    Report Status 06/08/2018 FINAL  Final  Culture, respiratory     Status: None   Collection Time: 06/08/18  3:06 PM  Result Value Ref Range Status   Specimen Description   Final    EXPECTORATED SPUTUM Performed at [facility], [address], [place], [place] [address]    Special Requests   Final    NONE Reflexed from [phone] Performed at [facility], [address] [place], [place] [address]    Gram Stain   Final    ABUNDANT WBC PRESENT,BOTH PMN AND MONONUCLEAR FEW GRAM POSITIVE COCCI FEW GRAM NEGATIVE RODS FEW SQUAMOUS EPITHELIAL CELLS PRESENT Performed at [facility], [address], [place], [place] [address]    Culture FEW STAPHYLOCOCCUS AUREUS  Final   Report Status 06/11/2018 FINAL  Final   Organism ID, Bacteria STAPHYLOCOCCUS AUREUS  Final      Susceptibility   Staphylococcus aureus - MIC*    CIPROFLOXACIN <=0.5 SENSITIVE Sensitive     ERYTHROMYCIN >=8 RESISTANT Resistant     GENTAMICIN <=0.5 SENSITIVE Sensitive     OXACILLIN 0.5 SENSITIVE Sensitive     TETRACYCLINE <=1 SENSITIVE Sensitive     VANCOMYCIN 1 SENSITIVE Sensitive     TRIMETH/SULFA <=10 SENSITIVE Sensitive     CLINDAMYCIN <=0.25 SENSITIVE Sensitive     RIFAMPIN <=0.5 SENSITIVE Sensitive     Inducible Clindamycin NEGATIVE Sensitive     * FEW STAPHYLOCOCCUS AUREUS  MRSA PCR Screening     Status: None   Collection Time: 06/08/18  3:22 PM  Result Value Ref Range Status   MRSA by PCR NEGATIVE NEGATIVE Final    Comment:        The GeneXpert MRSA Assay (FDA approved for NASAL specimens only), is one component of a comprehensive MRSA colonization surveillance program. It is not intended to diagnose MRSA infection nor to guide or monitor treatment for MRSA infections. Performed at [facility], [address], [place], [place] [address]   Culture, blood (routine x 2)     Status: None   Collection Time: 06/10/18  3:14 AM  Result Value Ref Range Status   Specimen Description   Final    BLOOD LEFT ARM Performed at [facility], [address], [place], [place] [address]    Special Requests   Final    BOTTLES DRAWN AEROBIC ONLY Blood Culture adequate volume Performed at [facility], [address], [place], [place] [address]    Culture   Final    NO GROWTH 5 DAYS Performed at [facility], [address] [place], [place] [address]    Report Status 06/15/2018 FINAL  Final  Culture, blood (routine x 2)     Status: None   Collection Time: 06/10/18  3:14 AM  Result Value Ref Range Status   Specimen Description   Final    BLOOD LEFT HAND Performed at [facility], [address], [place], [place] [address]    Special Requests   Final    BOTTLES DRAWN AEROBIC ONLY Blood Culture results may not be optimal due to an inadequate volume of blood received in culture bottles Performed at [facility], [address] [place], [place] [address]    Culture   Final    NO GROWTH 5 DAYS Performed at [facility], [address], [place], [place] [address]    Report Status 06/15/2018 FINAL  Final  MRSA PCR Screening     Status: None   Collection Time: 06/13/18  7:55 PM  Result Value Ref Range Status   MRSA by PCR NEGATIVE NEGATIVE Final    Comment:        The GeneXpert MRSA Assay (FDA approved for NASAL specimens only), is one component of a comprehensive MRSA colonization surveillance program. It is not intended to diagnose MRSA infection nor to guide or monitor treatment for MRSA infections. Performed at [facility], [address] [place], [place] [address]   Culture, blood (routine x 2)     Status: None (Preliminary result)   Collection Time: 06/15/18  9:10 AM  Result Value Ref Range Status   Specimen Description BLOOD LEFT ANTECUBITAL  Final   Special Requests   Final    BOTTLES DRAWN AEROBIC ONLY Blood Culture results may not be optimal due to an inadequate volume of blood received in culture bottles   Culture   Final    NO GROWTH 3 DAYS Performed at [facility], [address], [place], [place] [address]    Report Status PENDING  Incomplete  Culture, blood (routine x 2)     Status: None (Preliminary result)   Collection Time: 06/15/18  9:20 AM  Result Value Ref Range Status   Specimen Description BLOOD  LEFT ANTECUBITAL  Final   Special Requests   Final    BOTTLES DRAWN AEROBIC ONLY Blood Culture results may not be optimal due to an inadequate volume of blood received in culture bottles   Culture   Final    NO GROWTH 3 DAYS Performed at [facility], [address], [place], [place] [address]    Report Status PENDING  Incomplete  Body fluid culture     Status: None (Preliminary result)   Collection Time: 06/16/18 11:16 AM  Result Value Ref Range Status   Specimen Description SYNOVIAL RIGHT SHOULDER  Final   Special Requests NONE  Final   Gram Stain   Final    RARE WBC PRESENT, PREDOMINANTLY PMN NO ORGANISMS SEEN CRITICAL RESULT CALLED TO, READ BACK BY AND VERIFIED WITH: DR JEFFERY AT 1157 ON [id number] BY SJW    Culture   Final    NO GROWTH 2 DAYS Performed at [facility], [address] [place], [place] [address]    Report Status PENDING  Incomplete    Studies/Results: Ct Chest W Contrast  Result Date: 06/17/2018 CLINICAL DATA:  Right chest pain and shortness of breath. IV drug abuse. Septic emboli. EXAM: CT CHEST WITH CONTRAST TECHNIQUE: Multidetector CT imaging of the chest was performed during intravenous contrast administration. CONTRAST:  75mL OMNIPAQUE IOHEXOL 300 MG/ML  SOLN COMPARISON:  Thoracic spine CT from 06/15/2018 FINDINGS: Cardiovascular: Mild cardiomegaly. Mediastinum/Nodes: Supraclavicular adenopathy including a 1.6 cm right supraclavicular node on image 19/3. Indistinct subcarinal adenopathy approximately 1.4 cm in diameter. Indistinct hilar adenopathy with a right hilar node at about 1.5  cm in short axis on image 71/3. Lungs/Pleura: Numerous cavitary nodules are present throughout both lungs. Many of these are confluent with other cavitary nodules. Some of the nodules are not overtly cavitary. Index confluence of cavitary nodules at the right lung apex measures 4.1 by 3.5 cm. Moderate-sized bilateral pleural effusions are present. There is likely some mild  pleural enhancement along with some pleural loculation more notably on the left, exudative effusions or empyema not excluded. Upper Abdomen: Unremarkable Musculoskeletal: On recent right shoulder MRI, there was an abnormal fluid collection tracking along the right subscapularis muscle laterally. This is partially seen on image 39/3, and may measure up to about 8.8 by 3.0 by 4.3 cm. This may well be an abscess within or along the lateral right subscapularis muscle. IMPRESSION: 1. Numerous cavitary nodules in both lungs favoring septic emboli in this setting. Bilateral pleural effusions are likely exudative and empyema is not excluded. 2. Abnormal low-density process involving the lateral portion of the right subscapularis muscle suspicious for abscess. 3. Reactive adenopathy including a 1.6 cm right supraclavicular lymph node. 4. Mild cardiomegaly. Electronically Signed   By: Gaylyn Rong M.D.   On: 06/17/2018 01:42   Mr Foot Right Wo Contrast  Result Date: 06/17/2018 CLINICAL DATA:  Draining wound. Evaluate for osteomyelitis. Wound is along the plantar aspect of the foot. EXAM: MRI OF THE RIGHT FOREFOOT WITHOUT CONTRAST TECHNIQUE: Multiplanar, multisequence MR imaging of the right foot was performed. No intravenous contrast was administered. COMPARISON:  None. FINDINGS: Bones/Joint/Cartilage No marrow signal abnormality. No fracture or dislocation. Normal alignment. No joint effusion. No periosteal reaction or bone destruction. No aggressive osseous lesion. Ligaments Collateral ligaments are intact.  Lisfranc ligament is intact. Muscles and Tendons Flexor, peroneal and extensor compartment tendons are intact. Muscles are normal. Soft tissue Soft tissue wound along the plantar aspect of the midfoot. No adjacent fluid collection or hematoma. Mild soft tissue edema along the forefoot which may be secondary to cellulitis versus reactive edema secondary to venous stasis or fluid overload. No soft tissue mass.  IMPRESSION: 1. No osteomyelitis of the right forefoot. 2. Soft tissue wound along the plantar aspect of the midfoot. No fluid collection to suggest an abscess or hematoma. 3. Mild soft tissue edema along the forefoot which may be secondary to cellulitis versus reactive edema secondary to venous stasis or fluid overload. Electronically Signed   By: Elige Ko   On: 06/17/2018 08:21     Assessment/Plan: IVDA MSSA bacteremia TV IE Empyema?  Total days of antibiotics: 10 cefazolin  Await CVTS vs CCM eval for empyema? Fever better last 12h.  Continue suboxone Hep C RNA pending.  Available 10-13         Johny Sax MD, FACP Infectious Diseases (pager) [phone] [email or web address] 06/18/2018, 2:16 PM  LOS: 10 days

## 2018-06-18 NOTE — Progress Notes (Signed)
One unit of PRBC completed. He is refusing his 2nd unit of PRBC. He states he feels better and he can breath better. He is also refusing his Thoracentesis stating " Why man , are you going to poke a needle in my chest when I am already feeling better, I just don't get it". Had explained to pt about the procedure and the blood transfusion , the benefit it will give him but he continued to refused. Had text paged Dr. Clearnce Sorrel about pt refusal. MD has seen pt  and pt indicated he will continue to think about it.

## 2018-06-18 NOTE — Progress Notes (Signed)
PROGRESS NOTE    Oscar Burke  ZOX:[id number] DOB: 09-18-1980 DOA: 06/08/2018 PCP: Patient, No Pcp Per    Brief Narrative:  37 year old with past medical history relevant for IV heroin abuse, chronic hepatitis C who presented to the emergency department with fevers and body aches as well as whitish sputum and tachypnea and acute hypoxic respiratory failure and found to have septic pulmonary emboli and MSSA tricuspid valve endocarditis.   Assessment & Plan:   Principal Problem:   Endocarditis of tricuspid valve Active Problems:   Acute on chronic respiratory failure with hypoxia (HCC)   Polysubstance abuse (HCC)   Alcohol dependence with unspecified alcohol-induced disorder (HCC)   Abdominal pain   Hepatitis C, chronic (HCC)   Septic embolism (HCC)   Multifocal pneumonia   Tobacco abuse   Substance induced mood disorder (HCC)   Empyema lung (HCC)   #) MSSA tricuspid valve endocarditis complicated by septic emboli: Patient continues to have a fever. -Infectious diseases following, appreciate recommendations -MRI of thoracic and lumbar spine on 06/15/2018 shows no evidence of metastatic disease -MRI right shoulder on 06/15/2018 shows shoulder effusion and large abscess along subscapularis -Chest CT on 06/16/2018 shows septic emboli and empyema versus parapneumonic effusion -Foot MRI on 06/16/2018 shows evidence of soft tissue wound and tissue edema but no evidence of abscess or hematoma - Blood cultures from 06/08/2018 growing MSSA -Blood cultures from 06/10/2018 negative -Blood cultures from 06/15/2018 no growth to date -Shoulder arthrocentesis cultures from 06/16/2018 no growth to date -Continue IV cefazolin started 06/2018, per infectious disease if this fever is breakthrough than they would consider switching to daptomycin  - infectious disease feels that patient may require pigtail catheter for empyema due to persistent fevers -Interventional radiology is recommended  discussing with pulmonary or cardiothoracic surgery about possible pigtail catheter or chest tube placement  #) Right shoulder effusion status post arthrocentesis on 06/16/2018 by interventional radiology: Noted on MRI on 06/15/2018 -Orthopedic surgery consulted, felt that with patient's movement intact effusion was more more likely to be reactive rather than septic arthritis - Interventional radiology consulted - Minimal fluid was obtained and so differential was not sent off only culture  #) Anemia: Patient has been noted to be anemic.  This is likely secondary to inflammation and bone marrow suppression. - We will send off B12 and folate -Send off iron studies -We will send off reticulocyte count -LDH is normal, low suspicion for hemolysis -Pending 2 units packed red blood cells today  #) Polysubstance abuse: -Continue Suboxone 1 tablet daily  #) Pain/psych: -Continue PRN cycle Benzapril clonazepam  #) Acute hypoxic respiratory failure: Likely secondary to septic emboli in the lungs.  This is resolved now  #) Chronic hep C: -Outpatient treatment  Fluids: Tolerating p.o. Electrodes: Monitor and supplement Nutrition: Regular diet  Prophylaxis: Enoxaparin  Disposition: Pending completion of IV antibiotics and resolution of fevers  Full code   Consultants:   Infectious disease  Cardio thoracic surgery  Cardiology  Procedures:  06/08/2018 echo:- Left ventricle: The cavity size was normal. Wall thickness was   normal. Systolic function was normal. The estimated ejection   fraction was in the range of 55% to 60%. Wall motion was normal;   there were no regional wall motion abnormalities. Left   ventricular diastolic function parameters were normal. - Ventricular septum: Very mildly D-shaped interventricular septum   is concerning for RV pressure/volume overload. - Aortic valve: There was no stenosis. - Mitral valve: There was no regurgitation. - Right ventricle:  The  cavity size was mildly dilated. Systolic   function was normal. - Right atrium: The atrium was mildly dilated. - Tricuspid valve: There was mild-moderate regurgitation. Suspected   tricuspid vegetation. Peak RV-RA gradient (S): 25 mm Hg. - Pulmonary arteries: PA peak pressure: 33 mm Hg (S). - Systemic veins: IVC measured 2.3 cm with > 50% respirophasic   variation, suggesting RA pressure 8 mmHg. - Pericardium, extracardiac: Small circumferential pericardial   effusion.  Impressions:  - Normal LV size and systolic function, EF 55-60%. Normal diastolic   function. Mildly D-shaped interventricular septum suggesting a   degree of RV pressure/volume overload. Mildly dilated RV with   normal systolic function. Mild to moderate TR. There does appear    to be a tricuspid valve vegetation. Need TEE to confirm.   06/13/2018 TEE:- Left ventricle: Systolic function was normal. The estimated   ejection fraction was in the range of 60% to 65%. - Aortic valve: No evidence of vegetation. - Mitral valve: No evidence of vegetation. - Left atrium: No evidence of thrombus in the atrial cavity or   appendage. - Right atrium: No evidence of thrombus in the atrial cavity or   appendage. - Atrial septum: No defect or patent foramen ovale was identified. - Tricuspid valve: Large vegetation on the lateral leaflet of the   TV measuring 2 cm in longest dimension. There was moderate-severe   regurgitation. - Pulmonic valve: No evidence of vegetation.  Antimicrobials:   IV cefazolin started 06/08/2018   Subjective: Patient is more awake today.  He reports that his shoulder pain is fairly well controlled.  Denies any nausea, vomiting, diarrhea, cough, congestion.  On further discussion  Objective: Vitals:   06/17/18 0521 06/17/18 1512 06/17/18 2007 06/18/18 0451  BP: 111/73 122/75 129/87 132/85  Pulse: 96 (!) 102 97 92  Resp:  (!) 21 18 19   Temp: 98.4 F (36.9 C) (!) 101.9 F (38.8 C) 98.4 F  (36.9 C) 98.6 F (37 C)  TempSrc: Oral Oral Oral Oral  SpO2: 99% 97% 100% 95%  Weight:    79.5 kg  Height:        Intake/Output Summary (Last 24 hours) at 06/18/2018 1130 Last data filed at 06/18/2018 0900 Gross per 24 hour  Intake 960 ml  Output 500 ml  Net 460 ml   Filed Weights   06/16/18 0606 06/17/18 0518 06/18/18 0451  Weight: 79.2 kg 79.8 kg 79.5 kg    Examination:  General exam: Appears calm and comfortable  Respiratory system: Clear to auscultation. Respiratory effort normal.  Diminished lung sounds at bases Cardiovascular system: Regular rate and rhythm, 2 out of 6 systolic murmur heard best at left upper sternal border Gastrointestinal system: Abdomen is nondistended, soft and nontender. No organomegaly or masses felt. Normal bowel sounds heard. Central nervous system: Alert and oriented. No focal neurological deficits. Extremities: No lower extremity edema.  No midline tenderness over back and shoulder on right,  Skin: Metastatic disease appreciated over extremities Psychiatry: Judgement and insight appear poor. Mood & affect appropriate.     Data Reviewed: I have personally reviewed following labs and imaging studies  CBC: Recent Labs  Lab 06/13/18 0328 06/15/18 0423 06/16/18 0341 06/17/18 0534 06/18/18 0435  WBC 18.9* 17.8* 16.5* 17.5* 15.8*  HGB 8.2* 8.4* 7.3* 7.3* 7.0*  HCT 24.7* 25.9* 23.4* 23.0* 22.4*  MCV 87.6 88.1 89.0 89.1 89.6  PLT 258 324 342 391 406*   Basic Metabolic Panel: Recent Labs  Lab 06/13/18 1935  06/15/18 0423 06/16/18 0341 06/17/18 0534 06/18/18 0435  NA 126* 127* 129* 131* 130*  K 4.3 4.5 4.9 5.1 5.3*  CL 89* 91* 95* 94* 95*  CO2 28 29 29 31 30   GLUCOSE 99 114* 116* 113* 99  BUN 22* 29* 29* 23* 19  CREATININE 1.20 1.33* 1.37* 1.23 1.15  CALCIUM 7.3* 7.6* 7.7* 8.0* 7.9*  MG  --   --  2.0 1.8  --    GFR: Estimated Creatinine Clearance: 91.9 mL/min (by C-G formula based on SCr of 1.15 mg/dL). Liver Function  Tests: Recent Labs  Lab 06/12/18 0951 06/15/18 0423 06/16/18 0341 06/18/18 1038  AST  --  46* 49*  --   ALT  --  11 13  --   ALKPHOS  --  136* 137*  --   BILITOT 1.6* 0.8 0.7  --   PROT  --  5.9* 5.6* 5.5*  ALBUMIN  --  1.3* 1.3*  --    No results for input(s): LIPASE, AMYLASE in the last 168 hours. No results for input(s): AMMONIA in the last 168 hours. Coagulation Profile: No results for input(s): INR, PROTIME in the last 168 hours. Cardiac Enzymes: No results for input(s): CKTOTAL, CKMB, CKMBINDEX, TROPONINI in the last 168 hours. BNP (last 3 results) No results for input(s): PROBNP in the last 8760 hours. HbA1C: No results for input(s): HGBA1C in the last 72 hours. CBG: No results for input(s): GLUCAP in the last 168 hours. Lipid Profile: No results for input(s): CHOL, HDL, LDLCALC, TRIG, CHOLHDL, LDLDIRECT in the last 72 hours. Thyroid Function Tests: No results for input(s): TSH, T4TOTAL, FREET4, T3FREE, THYROIDAB in the last 72 hours. Anemia Panel: No results for input(s): VITAMINB12, FOLATE, FERRITIN, TIBC, IRON, RETICCTPCT in the last 72 hours. Sepsis Labs: No results for input(s): PROCALCITON, LATICACIDVEN in the last 168 hours.  Recent Results (from the past 240 hour(s))  Culture, sputum-assessment     Status: None   Collection Time: 06/08/18  3:06 PM  Result Value Ref Range Status   Specimen Description EXPECTORATED SPUTUM  Final   Special Requests NONE  Final   Sputum evaluation   Final    THIS SPECIMEN IS ACCEPTABLE FOR SPUTUM CULTURE Performed at [facility], [address], [place], [place] [address]    Report Status 06/08/2018 FINAL  Final  Culture, respiratory     Status: None   Collection Time: 06/08/18  3:06 PM  Result Value Ref Range Status   Specimen Description   Final    EXPECTORATED SPUTUM Performed at [facility], [address] [place], [place] [address]    Special Requests   Final    NONE  Reflexed from [phone] Performed at [facility], [address], [place], [place] [address]    Gram Stain   Final    ABUNDANT WBC PRESENT,BOTH PMN AND MONONUCLEAR FEW GRAM POSITIVE COCCI FEW GRAM NEGATIVE RODS FEW SQUAMOUS EPITHELIAL CELLS PRESENT Performed at [facility], [address] [place], [place] [address]    Culture FEW STAPHYLOCOCCUS AUREUS  Final   Report Status 06/11/2018 FINAL  Final   Organism ID, Bacteria STAPHYLOCOCCUS AUREUS  Final      Susceptibility   Staphylococcus aureus - MIC*    CIPROFLOXACIN <=0.5 SENSITIVE Sensitive     ERYTHROMYCIN >=8 RESISTANT Resistant     GENTAMICIN <=0.5 SENSITIVE Sensitive     OXACILLIN 0.5 SENSITIVE Sensitive     TETRACYCLINE <=1 SENSITIVE Sensitive  VANCOMYCIN 1 SENSITIVE Sensitive     TRIMETH/SULFA <=10 SENSITIVE Sensitive     CLINDAMYCIN <=0.25 SENSITIVE Sensitive     RIFAMPIN <=0.5 SENSITIVE Sensitive     Inducible Clindamycin NEGATIVE Sensitive     * FEW STAPHYLOCOCCUS AUREUS  MRSA PCR Screening     Status: None   Collection Time: 06/08/18  3:22 PM  Result Value Ref Range Status   MRSA by PCR NEGATIVE NEGATIVE Final    Comment:        The GeneXpert MRSA Assay (FDA approved for NASAL specimens only), is one component of a comprehensive MRSA colonization surveillance program. It is not intended to diagnose MRSA infection nor to guide or monitor treatment for MRSA infections. Performed at [facility], [address], [place], [place] [address]   Culture, blood (routine x 2)     Status: None   Collection Time: 06/10/18  3:14 AM  Result Value Ref Range Status   Specimen Description   Final    BLOOD LEFT ARM Performed at [facility], [address], [place], [place] [address]    Special Requests   Final    BOTTLES DRAWN AEROBIC ONLY Blood Culture adequate volume Performed at [facility] [place], [address], [place], [place]  [address]    Culture   Final    NO GROWTH 5 DAYS Performed at [facility], [address] [place], [place] [address]    Report Status 06/15/2018 FINAL  Final  Culture, blood (routine x 2)     Status: None   Collection Time: 06/10/18  3:14 AM  Result Value Ref Range Status   Specimen Description   Final    BLOOD LEFT HAND Performed at [facility], [address], [place], [place] [address]    Special Requests   Final    BOTTLES DRAWN AEROBIC ONLY Blood Culture results may not be optimal due to an inadequate volume of blood received in culture bottles Performed at [facility], [address], [place], [place] [address]    Culture   Final    NO GROWTH 5 DAYS Performed at [facility], [address] [place], [place] [address]    Report Status 06/15/2018 FINAL  Final  MRSA PCR Screening     Status: None   Collection Time: 06/13/18  7:55 PM  Result Value Ref Range Status   MRSA by PCR NEGATIVE NEGATIVE Final    Comment:        The GeneXpert MRSA Assay (FDA approved for NASAL specimens only), is one component of a comprehensive MRSA colonization surveillance program. It is not intended to diagnose MRSA infection nor to guide or monitor treatment for MRSA infections. Performed at [facility], [address] [place], [place] [address]   Culture, blood (routine x 2)     Status: None (Preliminary result)   Collection Time: 06/15/18  9:10 AM  Result Value Ref Range Status   Specimen Description BLOOD LEFT ANTECUBITAL  Final   Special Requests   Final    BOTTLES DRAWN AEROBIC ONLY Blood Culture results may not be optimal due to an inadequate volume of blood received in culture bottles   Culture   Final    NO GROWTH 3 DAYS Performed at [facility], [address] [place], [place] [address]    Report Status PENDING  Incomplete  Culture, blood (routine x 2)  Status: None (Preliminary result)   Collection Time:  06/15/18  9:20 AM  Result Value Ref Range Status   Specimen Description BLOOD LEFT ANTECUBITAL  Final   Special Requests   Final    BOTTLES DRAWN AEROBIC ONLY Blood Culture results may not be optimal due to an inadequate volume of blood received in culture bottles   Culture   Final    NO GROWTH 3 DAYS Performed at [facility], [address], [place], [place] [address]    Report Status PENDING  Incomplete  Body fluid culture     Status: None (Preliminary result)   Collection Time: 06/16/18 11:16 AM  Result Value Ref Range Status   Specimen Description SYNOVIAL RIGHT SHOULDER  Final   Special Requests NONE  Final   Gram Stain   Final    RARE WBC PRESENT, PREDOMINANTLY PMN NO ORGANISMS SEEN CRITICAL RESULT CALLED TO, READ BACK BY AND VERIFIED WITH: DR JEFFERY AT 1157 ON [id number] BY SJW    Culture   Final    NO GROWTH 2 DAYS Performed at [facility], [address], [place], [place] [address]    Report Status PENDING  Incomplete         Radiology Studies: Ct Chest W Contrast  Result Date: 06/17/2018 CLINICAL DATA:  Right chest pain and shortness of breath. IV drug abuse. Septic emboli. EXAM: CT CHEST WITH CONTRAST TECHNIQUE: Multidetector CT imaging of the chest was performed during intravenous contrast administration. CONTRAST:  75mL OMNIPAQUE IOHEXOL 300 MG/ML  SOLN COMPARISON:  Thoracic spine CT from 06/15/2018 FINDINGS: Cardiovascular: Mild cardiomegaly. Mediastinum/Nodes: Supraclavicular adenopathy including a 1.6 cm right supraclavicular node on image 19/3. Indistinct subcarinal adenopathy approximately 1.4 cm in diameter. Indistinct hilar adenopathy with a right hilar node at about 1.5 cm in short axis on image 71/3. Lungs/Pleura: Numerous cavitary nodules are present throughout both lungs. Many of these are confluent with other cavitary nodules. Some of the nodules are not overtly cavitary. Index confluence of cavitary nodules at the right lung apex measures 4.1 by  3.5 cm. Moderate-sized bilateral pleural effusions are present. There is likely some mild pleural enhancement along with some pleural loculation more notably on the left, exudative effusions or empyema not excluded. Upper Abdomen: Unremarkable Musculoskeletal: On recent right shoulder MRI, there was an abnormal fluid collection tracking along the right subscapularis muscle laterally. This is partially seen on image 39/3, and may measure up to about 8.8 by 3.0 by 4.3 cm. This may well be an abscess within or along the lateral right subscapularis muscle. IMPRESSION: 1. Numerous cavitary nodules in both lungs favoring septic emboli in this setting. Bilateral pleural effusions are likely exudative and empyema is not excluded. 2. Abnormal low-density process involving the lateral portion of the right subscapularis muscle suspicious for abscess. 3. Reactive adenopathy including a 1.6 cm right supraclavicular lymph node. 4. Mild cardiomegaly. Electronically Signed   By: Gaylyn Rong M.D.   On: 06/17/2018 01:42   Mr Foot Right Wo Contrast  Result Date: 06/17/2018 CLINICAL DATA:  Draining wound. Evaluate for osteomyelitis. Wound is along the plantar aspect of the foot. EXAM: MRI OF THE RIGHT FOREFOOT WITHOUT CONTRAST TECHNIQUE: Multiplanar, multisequence MR imaging of the right foot was performed. No intravenous contrast was administered. COMPARISON:  None. FINDINGS: Bones/Joint/Cartilage No marrow signal abnormality. No fracture or dislocation. Normal alignment. No joint effusion. No periosteal reaction or bone destruction. No aggressive osseous lesion. Ligaments Collateral ligaments are intact.  Lisfranc ligament is  intact. Muscles and Tendons Flexor, peroneal and extensor compartment tendons are intact. Muscles are normal. Soft tissue Soft tissue wound along the plantar aspect of the midfoot. No adjacent fluid collection or hematoma. Mild soft tissue edema along the forefoot which may be secondary to cellulitis  versus reactive edema secondary to venous stasis or fluid overload. No soft tissue mass. IMPRESSION: 1. No osteomyelitis of the right forefoot. 2. Soft tissue wound along the plantar aspect of the midfoot. No fluid collection to suggest an abscess or hematoma. 3. Mild soft tissue edema along the forefoot which may be secondary to cellulitis versus reactive edema secondary to venous stasis or fluid overload. Electronically Signed   By: Elige Ko   On: 06/17/2018 08:21        Scheduled Meds: . sodium chloride   Intravenous Once  . buprenorphine-naloxone  1 tablet Sublingual Daily  . enoxaparin (LOVENOX) injection  40 mg Subcutaneous Q24H  . iopamidol  20 mL Intra-articular Once  . mouth rinse  15 mL Mouth Rinse BID  . polyethylene glycol  17 g Oral Daily  . risperiDONE  1 mg Oral QHS  . senna  2 tablet Oral Daily   Continuous Infusions: . sodium chloride 250 mL (06/15/18 1730)  .  ceFAZolin (ANCEF) IV 2 g (06/18/18 0906)     LOS: 10 days    Time spent: 35    Delaine Lame, MD [facility]  If 7PM-7AM, please contact night-coverage [email or web address] Password [email or web address] 06/18/2018, 11:30 AM

## 2018-06-19 ENCOUNTER — Inpatient Hospital Stay (HOSPITAL_COMMUNITY): Payer: Self-pay

## 2018-06-19 LAB — LACTATE DEHYDROGENASE, PLEURAL OR PERITONEAL FLUID: LD, Fluid: 157 U/L — ABNORMAL HIGH (ref 3–23)

## 2018-06-19 LAB — CBC
HCT: 25.3 % — ABNORMAL LOW (ref 39.0–52.0)
Hemoglobin: 8 g/dL — ABNORMAL LOW (ref 13.0–17.0)
MCH: 27.9 pg (ref 26.0–34.0)
MCHC: 31.6 g/dL (ref 30.0–36.0)
MCV: 88.2 fL (ref 80.0–100.0)
Platelets: 435 K/uL — ABNORMAL HIGH (ref 150–400)
RBC: 2.87 MIL/uL — ABNORMAL LOW (ref 4.22–5.81)
RDW: 14 % (ref 11.5–15.5)
WBC: 17.6 10*3/uL — ABNORMAL HIGH (ref 4.0–10.5)
nRBC: 0 % (ref 0.0–0.2)

## 2018-06-19 LAB — BODY FLUID CULTURE: CULTURE: NO GROWTH

## 2018-06-19 LAB — PROTEIN, PLEURAL OR PERITONEAL FLUID: Total protein, fluid: <3 g/dL

## 2018-06-19 LAB — BODY FLUID CELL COUNT WITH DIFFERENTIAL
Eos, Fluid: 0 %
Lymphs, Fluid: 7 %
Monocyte-Macrophage-Serous Fluid: 4 % — ABNORMAL LOW (ref 50–90)
Neutrophil Count, Fluid: 89 % — ABNORMAL HIGH (ref 0–25)
Total Nucleated Cell Count, Fluid: 4194 uL — ABNORMAL HIGH (ref 0–1000)

## 2018-06-19 LAB — COMPREHENSIVE METABOLIC PANEL WITH GFR
AST: 89 U/L — ABNORMAL HIGH (ref 15–41)
Albumin: 1.4 g/dL — ABNORMAL LOW (ref 3.5–5.0)
BUN: 18 mg/dL (ref 6–20)
CO2: 27 mmol/L (ref 22–32)
Chloride: 95 mmol/L — ABNORMAL LOW (ref 98–111)
GFR calc non Af Amer: >60 mL/min (ref >60)
Sodium: 131 mmol/L — ABNORMAL LOW (ref 135–145)

## 2018-06-19 LAB — COMPREHENSIVE METABOLIC PANEL
ALT: 29 U/L (ref 0–44)
Alkaline Phosphatase: 140 U/L — ABNORMAL HIGH (ref 38–126)
Anion gap: 9 (ref 5–15)
Calcium: 7.9 mg/dL — ABNORMAL LOW (ref 8.9–10.3)
Creatinine, Ser: 1.1 mg/dL (ref 0.61–1.24)
GFR calc Af Amer: >60 mL/min (ref >60)
Glucose, Bld: 96 mg/dL (ref 70–99)
Potassium: 4.9 mmol/L (ref 3.5–5.1)
Total Bilirubin: 0.7 mg/dL (ref 0.3–1.2)
Total Protein: 5.8 g/dL — ABNORMAL LOW (ref 6.5–8.1)

## 2018-06-19 LAB — GLUCOSE, PLEURAL OR PERITONEAL FLUID: Glucose, Fluid: 127 mg/dL

## 2018-06-19 LAB — MAGNESIUM: Magnesium: 1.5 mg/dL — ABNORMAL LOW (ref 1.7–2.4)

## 2018-06-19 LAB — VITAMIN B12: Vitamin B-12: 819 pg/mL (ref 180–914)

## 2018-06-19 LAB — IRON AND TIBC
Iron: 13 ug/dL — ABNORMAL LOW (ref 45–182)
Saturation Ratios: 7 % — ABNORMAL LOW (ref 17.9–39.5)
TIBC: 175 ug/dL — ABNORMAL LOW (ref 250–450)
UIBC: 162 ug/dL

## 2018-06-19 LAB — FERRITIN: Ferritin: 611 ng/mL — ABNORMAL HIGH (ref 24–336)

## 2018-06-19 LAB — RETICULOCYTES
Immature Retic Fract: 10.9 % (ref 2.3–15.9)
RBC.: 2.9 MIL/uL — ABNORMAL LOW (ref 4.22–5.81)
Retic Count, Absolute: 39.2 10*3/uL (ref 19.0–186.0)
Retic Ct Pct: 1.4 % (ref 0.4–3.1)

## 2018-06-19 MED ORDER — KETOROLAC TROMETHAMINE 30 MG/ML IJ SOLN
30.0000 mg | Freq: Once | INTRAMUSCULAR | Status: AC
  Administered 2018-06-20: 30 mg via INTRAVENOUS
  Filled 2018-06-19: qty 1

## 2018-06-19 MED ORDER — HYDROMORPHONE HCL 1 MG/ML IJ SOLN
0.5000 mg | Freq: Once | INTRAMUSCULAR | Status: AC
  Administered 2018-06-19: 0.5 mg via INTRAVENOUS
  Filled 2018-06-19: qty 1

## 2018-06-19 MED ORDER — IOHEXOL 300 MG/ML  SOLN
100.0000 mL | Freq: Once | INTRAMUSCULAR | Status: AC | PRN
  Administered 2018-06-19: 100 mL via INTRAVENOUS

## 2018-06-19 MED ORDER — MAGNESIUM SULFATE 2 GM/50ML IV SOLN
2.0000 g | Freq: Once | INTRAVENOUS | Status: AC
  Administered 2018-06-19: 2 g via INTRAVENOUS
  Filled 2018-06-19: qty 50

## 2018-06-19 MED ORDER — LORAZEPAM 2 MG/ML IJ SOLN: 1.0000 mg | Freq: Once | INTRAMUSCULAR | Status: DC

## 2018-06-19 MED ORDER — LORAZEPAM 2 MG/ML IJ SOLN
0.5000 mg | Freq: Once | INTRAMUSCULAR | Status: AC
  Administered 2018-06-19: 0.25 mg via INTRAVENOUS
  Filled 2018-06-19: qty 1

## 2018-06-19 NOTE — Progress Notes (Signed)
PROGRESS NOTE    Oscar Burke  ZOX:[id number] DOB: 05-11-1981 DOA: 06/08/2018 PCP: Patient, No Pcp Per    Brief Narrative:  37 year old with past medical history relevant for IV heroin abuse, chronic hepatitis C who presented to the emergency department with fevers and body aches as well as whitish sputum and tachypnea and acute hypoxic respiratory failure and found to have septic pulmonary emboli and MSSA tricuspid valve endocarditis.   Assessment & Plan:   Principal Problem:   Endocarditis of tricuspid valve Active Problems:   Acute on chronic respiratory failure with hypoxia (HCC)   Polysubstance abuse (HCC)   Alcohol dependence with unspecified alcohol-induced disorder (HCC)   Abdominal pain   Hepatitis C, chronic (HCC)   Septic embolism (HCC)   Multifocal pneumonia   Tobacco abuse   Substance induced mood disorder (HCC)   Empyema lung (HCC)   MSSA bacteremia   #) MSSA tricuspid valve endocarditis complicated by septic emboli: Patient continues to have a fever.  Currently the source is unclear.  Please see below for all the imaging studies. -Infectious diseases following, appreciate recommendations -MRI of thoracic and lumbar spine on 06/15/2018 shows no evidence of metastatic disease -MRI right shoulder on 06/15/2018 shows shoulder effusion and large abscess along subscapularis -Chest CT on 06/16/2018 shows septic emboli and empyema versus parapneumonic effusion -Foot MRI on 06/16/2018 shows evidence of soft tissue wound and tissue edema but no evidence of abscess or hematoma - Blood cultures from 06/08/2018 growing MSSA -Blood cultures from 06/10/2018 negative -Blood cultures from 06/15/2018 no growth to date -Shoulder arthrocentesis cultures from 06/16/2018 no growth to date -Continue IV cefazolin started 06/2018, per infectious disease if this fever is breakthrough than they would consider switching to daptomycin  - infectious disease feels that patient may require  pigtail catheter for empyema due to persistent fevers -Patient is currently refusing to have any interventions done for his pleural effusion/empyema at this time  #) Abdominal pain: Just started today.  He has diminished bowel sounds but no evidence of an acute abdomen at this time. -Abdominal x-ray pending -Repeat CMP tomorrow next line-low threshold to CT abdomen pelvis  #) Right shoulder effusion status post arthrocentesis on 06/16/2018 by interventional radiology: Noted on MRI on 06/15/2018 -Orthopedic surgery consulted, felt that with patient's movement intact effusion was more more likely to be reactive rather than septic arthritis - Interventional radiology consulted for arthrocentesis - Minimal fluid was obtained and so differential was not sent off only culture  #) Anemia: Patient has been noted to be anemic.  This is likely secondary to inflammation and bone marrow suppression. - We will send off B12 and folate -Send off iron studies -We will send off reticulocyte count -LDH is normal, low suspicion for hemolysis  #) Polysubstance abuse: -Continue Suboxone 1 tablet daily  #) Pain/psych: -Continue PRN cycle Benzapril clonazepam  #) Acute hypoxic respiratory failure: Likely secondary to septic emboli in the lungs.  This is resolved now  #) Chronic hep C: -Outpatient treatment  Fluids: Tolerating p.o. Electrodes: Monitor and supplement Nutrition: Regular diet  Prophylaxis: Enoxaparin  Disposition: Pending completion of IV antibiotics and resolution of fevers  Full code   Consultants:   Infectious disease  Cardio thoracic surgery  Cardiology  Procedures:  06/08/2018 echo:- Left ventricle: The cavity size was normal. Wall thickness was   normal. Systolic function was normal. The estimated ejection   fraction was in the range of 55% to 60%. Wall motion was normal;   there were  no regional wall motion abnormalities. Left   ventricular diastolic function  parameters were normal. - Ventricular septum: Very mildly D-shaped interventricular septum   is concerning for RV pressure/volume overload. - Aortic valve: There was no stenosis. - Mitral valve: There was no regurgitation. - Right ventricle: The cavity size was mildly dilated. Systolic   function was normal. - Right atrium: The atrium was mildly dilated. - Tricuspid valve: There was mild-moderate regurgitation. Suspected   tricuspid vegetation. Peak RV-RA gradient (S): 25 mm Hg. - Pulmonary arteries: PA peak pressure: 33 mm Hg (S). - Systemic veins: IVC measured 2.3 cm with > 50% respirophasic   variation, suggesting RA pressure 8 mmHg. - Pericardium, extracardiac: Small circumferential pericardial   effusion.  Impressions:  - Normal LV size and systolic function, EF 55-60%. Normal diastolic   function. Mildly D-shaped interventricular septum suggesting a   degree of RV pressure/volume overload. Mildly dilated RV with   normal systolic function. Mild to moderate TR. There does appear    to be a tricuspid valve vegetation. Need TEE to confirm.   06/13/2018 TEE:- Left ventricle: Systolic function was normal. The estimated   ejection fraction was in the range of 60% to 65%. - Aortic valve: No evidence of vegetation. - Mitral valve: No evidence of vegetation. - Left atrium: No evidence of thrombus in the atrial cavity or   appendage. - Right atrium: No evidence of thrombus in the atrial cavity or   appendage. - Atrial septum: No defect or patent foramen ovale was identified. - Tricuspid valve: Large vegetation on the lateral leaflet of the   TV measuring 2 cm in longest dimension. There was moderate-severe   regurgitation. - Pulmonic valve: No evidence of vegetation.  Antimicrobials:   IV cefazolin started 06/08/2018   Subjective: Patient is not doing well today.  He complains of some abdominal distention and pain.  He reports having normal bowel movements.  He reports  passing gas.  He denies any nausea or vomiting.  He denies any cough, congestion, rhinorrhea.  Objective: Vitals:   06/18/18 1509 06/18/18 1547 06/18/18 2031 06/19/18 0441  BP: 125/82 138/88 126/80 (!) 157/93  Pulse: 94 90 91 96  Resp: (!) 22  19 17   Temp:  98.8 F (37.1 C) (!) 100.8 F (38.2 C) 99.2 F (37.3 C)  TempSrc:  Oral Oral Oral  SpO2:  95% 95% 96%  Weight:    78.4 kg  Height:        Intake/Output Summary (Last 24 hours) at 06/19/2018 1024 Last data filed at 06/19/2018 0818 Gross per 24 hour  Intake 630 ml  Output 580 ml  Net 50 ml   Filed Weights   06/17/18 0518 06/18/18 0451 06/19/18 0441  Weight: 79.8 kg 79.5 kg 78.4 kg    Examination:  General exam: Appears calm and comfortable  Respiratory system: Clear to auscultation. Respiratory effort normal.  Diminished lung sounds at bases Cardiovascular system: Regular rate and rhythm, 2 out of 6 systolic murmur heard best at left upper sternal border Gastrointestinal system: Abdomen is mildly distended but soft, diffusely tender to palpation throughout but no rebound or guarding diminished bowel sounds. Central nervous system: Alert and oriented. No focal neurological deficits. Extremities: No lower extremity edema.  No midline tenderness over back and shoulder on right,  Skin: Metastatic disease appreciated over extremities Psychiatry: Judgement and insight appear poor. Mood & affect appropriate.     Data Reviewed: I have personally reviewed following labs and imaging studies  CBC: Recent Labs  Lab 06/15/18 0423 06/16/18 0341 06/17/18 0534 06/18/18 0435 06/19/18 0326  WBC 17.8* 16.5* 17.5* 15.8* 17.6*  HGB 8.4* 7.3* 7.3* 7.0* 8.0*  HCT 25.9* 23.4* 23.0* 22.4* 25.3*  MCV 88.1 89.0 89.1 89.6 88.2  PLT 324 342 391 406* 435*   Basic Metabolic Panel: Recent Labs  Lab 06/15/18 0423 06/16/18 0341 06/17/18 0534 06/18/18 0435 06/19/18 0326  NA 127* 129* 131* 130* 131*  K 4.5 4.9 5.1 5.3* 4.9  CL 91*  95* 94* 95* 95*  CO2 29 29 31 30 27   GLUCOSE 114* 116* 113* 99 96  BUN 29* 29* 23* 19 18  CREATININE 1.33* 1.37* 1.23 1.15 1.10  CALCIUM 7.6* 7.7* 8.0* 7.9* 7.9*  MG  --  2.0 1.8  --  1.5*   GFR: Estimated Creatinine Clearance: 96 mL/min (by C-G formula based on SCr of 1.1 mg/dL). Liver Function Tests: Recent Labs  Lab 06/15/18 0423 06/16/18 0341 06/18/18 1038 06/19/18 0326  AST 46* 49*  --  89*  ALT 11 13  --  29  ALKPHOS 136* 137*  --  140*  BILITOT 0.8 0.7  --  0.7  PROT 5.9* 5.6* 5.5* 5.8*  ALBUMIN 1.3* 1.3*  --  1.4*   No results for input(s): LIPASE, AMYLASE in the last 168 hours. No results for input(s): AMMONIA in the last 168 hours. Coagulation Profile: No results for input(s): INR, PROTIME in the last 168 hours. Cardiac Enzymes: No results for input(s): CKTOTAL, CKMB, CKMBINDEX, TROPONINI in the last 168 hours. BNP (last 3 results) No results for input(s): PROBNP in the last 8760 hours. HbA1C: No results for input(s): HGBA1C in the last 72 hours. CBG: No results for input(s): GLUCAP in the last 168 hours. Lipid Profile: No results for input(s): CHOL, HDL, LDLCALC, TRIG, CHOLHDL, LDLDIRECT in the last 72 hours. Thyroid Function Tests: No results for input(s): TSH, T4TOTAL, FREET4, T3FREE, THYROIDAB in the last 72 hours. Anemia Panel: No results for input(s): VITAMINB12, FOLATE, FERRITIN, TIBC, IRON, RETICCTPCT in the last 72 hours. Sepsis Labs: No results for input(s): PROCALCITON, LATICACIDVEN in the last 168 hours.  Recent Results (from the past 240 hour(s))  Culture, blood (routine x 2)     Status: None   Collection Time: 06/10/18  3:14 AM  Result Value Ref Range Status   Specimen Description   Final    BLOOD LEFT ARM Performed at [facility], [address], [place], [place] [address]    Special Requests   Final    BOTTLES DRAWN AEROBIC ONLY Blood Culture adequate volume Performed at [facility], [address], [place], [place] [address]    Culture   Final    NO GROWTH 5 DAYS Performed at [facility], [address] [place], [place] [address]    Report Status 06/15/2018 FINAL  Final  Culture, blood (routine x 2)     Status: None   Collection Time: 06/10/18  3:14 AM  Result Value Ref Range Status   Specimen Description   Final    BLOOD LEFT HAND Performed at [facility], [address], [place], [place] [address]    Special Requests   Final    BOTTLES DRAWN AEROBIC ONLY Blood Culture results may not be optimal due to an inadequate volume of blood received in culture bottles Performed at [facility], [address] [place], [place] [address]    Culture   Final  NO GROWTH 5 DAYS Performed at [facility], [address], [place], [place] [address]    Report Status 06/15/2018 FINAL  Final  MRSA PCR Screening     Status: None   Collection Time: 06/13/18  7:55 PM  Result Value Ref Range Status   MRSA by PCR NEGATIVE NEGATIVE Final    Comment:        The GeneXpert MRSA Assay (FDA approved for NASAL specimens only), is one component of a comprehensive MRSA colonization surveillance program. It is not intended to diagnose MRSA infection nor to guide or monitor treatment for MRSA infections. Performed at [facility], [address], [place], [place] [address]   Culture, blood (routine x 2)     Status: None (Preliminary result)   Collection Time: 06/15/18  9:10 AM  Result Value Ref Range Status   Specimen Description BLOOD LEFT ANTECUBITAL  Final   Special Requests   Final    BOTTLES DRAWN AEROBIC ONLY Blood Culture results may not be optimal due to an inadequate volume of blood received in culture bottles   Culture   Final    NO GROWTH 4 DAYS Performed at [facility], [address] [place], [place] [address]    Report Status PENDING  Incomplete  Culture, blood (routine x 2)     Status: None (Preliminary  result)   Collection Time: 06/15/18  9:20 AM  Result Value Ref Range Status   Specimen Description BLOOD LEFT ANTECUBITAL  Final   Special Requests   Final    BOTTLES DRAWN AEROBIC ONLY Blood Culture results may not be optimal due to an inadequate volume of blood received in culture bottles   Culture   Final    NO GROWTH 4 DAYS Performed at [facility], [address], [place], [place] [address]    Report Status PENDING  Incomplete  Body fluid culture     Status: None (Preliminary result)   Collection Time: 06/16/18 11:16 AM  Result Value Ref Range Status   Specimen Description SYNOVIAL RIGHT SHOULDER  Final   Special Requests NONE  Final   Gram Stain   Final    RARE WBC PRESENT, PREDOMINANTLY PMN NO ORGANISMS SEEN CRITICAL RESULT CALLED TO, READ BACK BY AND VERIFIED WITH: DR JEFFERY AT 1157 ON [id number] BY SJW    Culture   Final    NO GROWTH 3 DAYS Performed at [facility], [address] [place], [place] [address]    Report Status PENDING  Incomplete         Radiology Studies: No results found.      Scheduled Meds: . buprenorphine-naloxone  1 tablet Sublingual Daily  . enoxaparin (LOVENOX) injection  40 mg Subcutaneous Q24H  . iopamidol  20 mL Intra-articular Once  . mouth rinse  15 mL Mouth Rinse BID  . polyethylene glycol  17 g Oral Daily  . risperiDONE  1 mg Oral QHS  . senna  2 tablet Oral Daily   Continuous Infusions: . sodium chloride 250 mL (06/15/18 1730)  .  ceFAZolin (ANCEF) IV 2 g (06/19/18 0217)     LOS: 11 days    Time spent: 35    Delaine Lame, MD [facility]  If 7PM-7AM, please contact night-coverage [email or web address] Password [email or web address] 06/19/2018, 10:24 AM

## 2018-06-19 NOTE — Procedures (Signed)
INDICATION: Pleural effusion, rule out empyema PROCEDURE OPERATOR: Maeby Vankleeck ATTENDING PHYSICIAN: Elora Wolter    CONSENT:  Consent was obtained from patient prior to the procedure. Indications, risks, and benefits were explained at length.    PROCEDURE SUMMARY:  A time out was performed and the chest x-ray was reviewed, the appropriate side was confirmed with ultrasound guidance and marked. My hands were washed immediately prior to the procedure. I wore a surgical cap, mask and sterile gloves throughout the procedure. The patient was prepped and draped in a sterile manner using chlorhexidine scrub after the appropriate level was percussed and confirmed by ultrasound. 1% lidocaine was used to anesthesize the skin, subcutaneous tissue, superior aspect of the rib periosteum and parietal pleura. A finder needle was then introduced over the superior aspect of the rib to locate the pleural fluid; serosanguineous cloudy colored fluid was aspirated. A 10-blade scalpel was used to nick the skin at the insertion site. The Safe-t-Centesis needle was then introduced through the skin incision into the pleural space using negative aspiration pressure and the red colometric indicator to confirm appropriate positioning of the needle. The thoracentesis catheter was then threaded without difficulty.  550 ml of cloudy serosanguineous colored fluid was removed without difficulty. The catheter was then removed. No immediate complications were noted during the procedure. A post-procedure chest x-ray is pending at the time of this note. The fluid will be sent for studies.  Estimated blood loss is minimal.

## 2018-06-20 ENCOUNTER — Inpatient Hospital Stay (HOSPITAL_COMMUNITY): Payer: Self-pay

## 2018-06-20 LAB — COMPREHENSIVE METABOLIC PANEL
AST: 100 U/L — ABNORMAL HIGH (ref 15–41)
Albumin: 1.5 g/dL — ABNORMAL LOW (ref 3.5–5.0)
Anion gap: 7 (ref 5–15)
BUN: 22 mg/dL — ABNORMAL HIGH (ref 6–20)
CO2: 29 mmol/L (ref 22–32)
Chloride: 94 mmol/L — ABNORMAL LOW (ref 98–111)
Glucose, Bld: 112 mg/dL — ABNORMAL HIGH (ref 70–99)
Potassium: 4.5 mmol/L (ref 3.5–5.1)
Sodium: 130 mmol/L — ABNORMAL LOW (ref 135–145)
Total Bilirubin: 0.5 mg/dL (ref 0.3–1.2)
Total Protein: 6.6 g/dL (ref 6.5–8.1)

## 2018-06-20 LAB — CBC
HCT: 27.5 % — ABNORMAL LOW (ref 39.0–52.0)
Hemoglobin: 8.2 g/dL — ABNORMAL LOW (ref 13.0–17.0)
MCH: 27 pg (ref 26.0–34.0)
MCHC: 29.8 g/dL — ABNORMAL LOW (ref 30.0–36.0)
MCV: 90.5 fL (ref 80.0–100.0)
Platelets: 473 K/uL — ABNORMAL HIGH (ref 150–400)
RBC: 3.04 MIL/uL — ABNORMAL LOW (ref 4.22–5.81)
RDW: 13.7 % (ref 11.5–15.5)
WBC: 15.7 K/uL — ABNORMAL HIGH (ref 4.0–10.5)
nRBC: 0 % (ref 0.0–0.2)

## 2018-06-20 LAB — FOLATE RBC
Folate, Hemolysate: 377.5 ng/mL
Folate, RBC: 1492 ng/mL (ref >498)
Hematocrit: 25.3 % — ABNORMAL LOW (ref 37.5–51.0)

## 2018-06-20 LAB — COMPREHENSIVE METABOLIC PANEL WITH GFR
ALT: 33 U/L (ref 0–44)
Alkaline Phosphatase: 135 U/L — ABNORMAL HIGH (ref 38–126)
Calcium: 8 mg/dL — ABNORMAL LOW (ref 8.9–10.3)
Creatinine, Ser: 1.42 mg/dL — ABNORMAL HIGH (ref 0.61–1.24)
GFR calc Af Amer: >60 mL/min (ref >60)
GFR calc non Af Amer: >60 mL/min (ref >60)

## 2018-06-20 LAB — PH, BODY FLUID: pH, Body Fluid: 7.3

## 2018-06-20 LAB — MAGNESIUM: Magnesium: 2 mg/dL (ref 1.7–2.4)

## 2018-06-20 LAB — HCV RNA QUANT
HCV QUANT: 1020000 [IU]/mL (ref >50)
HCV Quantitative Log: 6.009 log10 IU/mL (ref >1.70)

## 2018-06-20 MED ORDER — SODIUM CHLORIDE 0.9 % IV BOLUS
500.0000 mL | Freq: Once | INTRAVENOUS | Status: AC
  Administered 2018-06-20: 500 mL via INTRAVENOUS

## 2018-06-20 MED ORDER — ACETAMINOPHEN 325 MG PO TABS
650.0000 mg | ORAL_TABLET | ORAL | Status: DC | PRN
  Administered 2018-06-20: 650 mg via ORAL
  Filled 2018-06-20: qty 2

## 2018-06-20 MED ORDER — SODIUM CHLORIDE 0.9 % IV BOLUS
1000.0000 mL | Freq: Once | INTRAVENOUS | Status: AC
  Administered 2018-06-20: 1000 mL via INTRAVENOUS

## 2018-06-20 NOTE — Progress Notes (Signed)
PROGRESS NOTE    Trejan Buda  ZOX:[id number] DOB: 1981-02-11 DOA: 06/08/2018 PCP: Patient, No Pcp Per    Brief Narrative:  37 year old with past medical history relevant for IV heroin abuse, chronic hepatitis C who presented to the emergency department with fevers and body aches as well as whitish sputum and tachypnea and acute hypoxic respiratory failure and found to have septic pulmonary emboli and MSSA tricuspid valve endocarditis.   Assessment & Plan:   Principal Problem:   Endocarditis of tricuspid valve Active Problems:   Acute on chronic respiratory failure with hypoxia (HCC)   Polysubstance abuse (HCC)   Alcohol dependence with unspecified alcohol-induced disorder (HCC)   Abdominal pain   Hepatitis C, chronic (HCC)   Septic embolism (HCC)   Multifocal pneumonia   Tobacco abuse   Substance induced mood disorder (HCC)   Empyema lung (HCC)   MSSA bacteremia   #) MSSA tricuspid valve endocarditis complicated by septic emboli: Patient continues to have a fever.  Currently the source is unclear.  Please see below for all the imaging studies.  Patient is status post a thoracentesis on 06/19/2018 -Infectious diseases following, appreciate recommendations -MRI of thoracic and lumbar spine on 06/15/2018 shows no evidence of metastatic disease -MRI right shoulder on 06/15/2018 shows shoulder effusion and large abscess along subscapularis -Chest CT on 06/16/2018 shows septic emboli and empyema versus parapneumonic effusion -Foot MRI on 06/16/2018 shows evidence of soft tissue wound and tissue edema but no evidence of abscess or hematoma - Blood cultures from 06/08/2018 growing MSSA -Blood cultures from 06/10/2018 negative -Blood cultures from 06/15/2018 no growth to date -Follow-up cultures from thoracentesis on 06/19/2018 -Shoulder arthrocentesis cultures from 06/16/2018 no growth to date -Continue IV cefazolin started 06/2018, per infectious disease if this fever is breakthrough  than they would consider switching to daptomycin  -Patient had bedside thoracentesis on 06/19/2018 with fluid showing LDH, protein that did not meet criteria for empyema.  The pH is pending however the glucose was fairly normal.  Cultures are pending   #) Abdominal pain: Resolved.  Abdominal x-ray was mildly abnormal with some dilated loops.  Patient reports his abdominal pain is gone now he is eating well with good bowel movements and gas. -CT abdomen pelvis on 06/19/2018 showed some evidence of ascites and bowel wall edema but no acute process.  #) Right shoulder effusion status post arthrocentesis on 06/16/2018 by interventional radiology: Noted on MRI on 06/15/2018 -Orthopedic surgery consulted, felt that with patient's movement intact effusion was more more likely to be reactive rather than septic arthritis - Interventional radiology consulted for arthrocentesis - Minimal fluid was obtained and so differential was not sent off only culture  #) Anemia: Patient has been noted to be anemic.  This is likely secondary to inflammation and bone marrow suppression. - We will send off B12 and folate -Send off iron studies -We will send off reticulocyte count -LDH is normal, low suspicion for hemolysis  #) Polysubstance abuse: -Continue Suboxone 1 tablet daily  #) Pain/psych: -Continue PRN cycle Benzapril clonazepam  #) Acute hypoxic respiratory failure: Likely secondary to septic emboli in the lungs.  This is resolved now  #) Chronic hep C: -Outpatient treatment  Fluids: Tolerating p.o. Electrodes: Monitor and supplement Nutrition: Regular diet  Prophylaxis: Enoxaparin  Disposition: Pending completion of IV antibiotics and resolution of fevers  Full code   Consultants:   Infectious disease  Cardio thoracic surgery  Cardiology  Procedures:  06/08/2018 echo:- Left ventricle: The cavity size was  normal. Wall thickness was   normal. Systolic function was normal. The estimated  ejection   fraction was in the range of 55% to 60%. Wall motion was normal;   there were no regional wall motion abnormalities. Left   ventricular diastolic function parameters were normal. - Ventricular septum: Very mildly D-shaped interventricular septum   is concerning for RV pressure/volume overload. - Aortic valve: There was no stenosis. - Mitral valve: There was no regurgitation. - Right ventricle: The cavity size was mildly dilated. Systolic   function was normal. - Right atrium: The atrium was mildly dilated. - Tricuspid valve: There was mild-moderate regurgitation. Suspected   tricuspid vegetation. Peak RV-RA gradient (S): 25 mm Hg. - Pulmonary arteries: PA peak pressure: 33 mm Hg (S). - Systemic veins: IVC measured 2.3 cm with > 50% respirophasic   variation, suggesting RA pressure 8 mmHg. - Pericardium, extracardiac: Small circumferential pericardial   effusion.  Impressions:  - Normal LV size and systolic function, EF 55-60%. Normal diastolic   function. Mildly D-shaped interventricular septum suggesting a   degree of RV pressure/volume overload. Mildly dilated RV with   normal systolic function. Mild to moderate TR. There does appear    to be a tricuspid valve vegetation. Need TEE to confirm.   06/13/2018 TEE:- Left ventricle: Systolic function was normal. The estimated   ejection fraction was in the range of 60% to 65%. - Aortic valve: No evidence of vegetation. - Mitral valve: No evidence of vegetation. - Left atrium: No evidence of thrombus in the atrial cavity or   appendage. - Right atrium: No evidence of thrombus in the atrial cavity or   appendage. - Atrial septum: No defect or patent foramen ovale was identified. - Tricuspid valve: Large vegetation on the lateral leaflet of the   TV measuring 2 cm in longest dimension. There was moderate-severe   regurgitation. - Pulmonic valve: No evidence of vegetation.  06/16/2018 arthrocentesis of right shoulder  with interventional radiology  06/19/2018 bedside thoracentesis  Antimicrobials:   IV cefazolin started 06/08/2018   Subjective: Patient is doing fairly well today.  He reports that his abdominal pain is resolved and he is eating well.  He reports his breathing is much better after the thoracentesis on 06/19/2018.  He denies any nausea, vomiting, diarrhea, cough, congestion, rhinorrhea.  Objective: Vitals:   06/19/18 2043 06/20/18 0543 06/20/18 0544 06/20/18 0800  BP:  95/66  101/69  Pulse: 100 84  80  Resp: (!) 24 17  15   Temp:   97.9 F (36.6 C)   TempSrc:   Axillary   SpO2: 97% 94%  97%  Weight:   78.9 kg   Height:        Intake/Output Summary (Last 24 hours) at 06/20/2018 1205 Last data filed at 06/19/2018 1923 Gross per 24 hour  Intake 360 ml  Output 500 ml  Net -140 ml   Filed Weights   06/18/18 0451 06/19/18 0441 06/20/18 0544  Weight: 79.5 kg 78.4 kg 78.9 kg    Examination:  General exam: Appears calm and comfortable  Respiratory system: Clear to auscultation. Respiratory effort normal.  Diminished lung sounds at bases Cardiovascular system: Regular rate and rhythm, 2 out of 6 systolic murmur heard best at left upper sternal border Gastrointestinal system: Abdomen soft, nondistended, no rebound or guarding, plus bowel sounds Central nervous system: Alert and oriented. No focal neurological deficits. Extremities: No lower extremity edema.  No midline tenderness over back and shoulder on right,  Skin:  Metastatic disease appreciated over extremities Psychiatry: Judgement and insight appear poor. Mood & affect appropriate.     Data Reviewed: I have personally reviewed following labs and imaging studies  CBC: Recent Labs  Lab 06/16/18 0341 06/17/18 0534 06/18/18 0435 06/19/18 0326 06/20/18 0357  WBC 16.5* 17.5* 15.8* 17.6* 15.7*  HGB 7.3* 7.3* 7.0* 8.0* 8.2*  HCT 23.4* 23.0* 22.4* 25.3* 27.5*  MCV 89.0 89.1 89.6 88.2 90.5  PLT 342 391 406* 435* 473*     Basic Metabolic Panel: Recent Labs  Lab 06/16/18 0341 06/17/18 0534 06/18/18 0435 06/19/18 0326 06/20/18 0357  NA 129* 131* 130* 131* 130*  K 4.9 5.1 5.3* 4.9 4.5  CL 95* 94* 95* 95* 94*  CO2 29 31 30 27 29   GLUCOSE 116* 113* 99 96 112*  BUN 29* 23* 19 18 22*  CREATININE 1.37* 1.23 1.15 1.10 1.42*  CALCIUM 7.7* 8.0* 7.9* 7.9* 8.0*  MG 2.0 1.8  --  1.5* 2.0   GFR: Estimated Creatinine Clearance: 74.4 mL/min (A) (by C-G formula based on SCr of 1.42 mg/dL (H)). Liver Function Tests: Recent Labs  Lab 06/15/18 0423 06/16/18 0341 06/18/18 1038 06/19/18 0326 06/20/18 0357  AST 46* 49*  --  89* 100*  ALT 11 13  --  29 33  ALKPHOS 136* 137*  --  140* 135*  BILITOT 0.8 0.7  --  0.7 0.5  PROT 5.9* 5.6* 5.5* 5.8* 6.6  ALBUMIN 1.3* 1.3*  --  1.4* 1.5*   No results for input(s): LIPASE, AMYLASE in the last 168 hours. No results for input(s): AMMONIA in the last 168 hours. Coagulation Profile: No results for input(s): INR, PROTIME in the last 168 hours. Cardiac Enzymes: No results for input(s): CKTOTAL, CKMB, CKMBINDEX, TROPONINI in the last 168 hours. BNP (last 3 results) No results for input(s): PROBNP in the last 8760 hours. HbA1C: No results for input(s): HGBA1C in the last 72 hours. CBG: No results for input(s): GLUCAP in the last 168 hours. Lipid Profile: No results for input(s): CHOL, HDL, LDLCALC, TRIG, CHOLHDL, LDLDIRECT in the last 72 hours. Thyroid Function Tests: No results for input(s): TSH, T4TOTAL, FREET4, T3FREE, THYROIDAB in the last 72 hours. Anemia Panel: Recent Labs    06/19/18 1124  VITAMINB12 819  FERRITIN 611*  TIBC 175*  IRON 13*  RETICCTPCT 1.4   Sepsis Labs: No results for input(s): PROCALCITON, LATICACIDVEN in the last 168 hours.  Recent Results (from the past 240 hour(s))  MRSA PCR Screening     Status: None   Collection Time: 06/13/18  7:55 PM  Result Value Ref Range Status   MRSA by PCR NEGATIVE NEGATIVE Final    Comment:         The GeneXpert MRSA Assay (FDA approved for NASAL specimens only), is one component of a comprehensive MRSA colonization surveillance program. It is not intended to diagnose MRSA infection nor to guide or monitor treatment for MRSA infections. Performed at [facility], [address], [place], [place] [address]   Culture, blood (routine x 2)     Status: None (Preliminary result)   Collection Time: 06/15/18  9:10 AM  Result Value Ref Range Status   Specimen Description BLOOD LEFT ANTECUBITAL  Final   Special Requests   Final    BOTTLES DRAWN AEROBIC ONLY Blood Culture results may not be optimal due to an inadequate volume of blood received in culture bottles   Culture   Final    NO GROWTH 4 DAYS  Performed at [facility], [address], [place], [place] [address]    Report Status PENDING  Incomplete  Culture, blood (routine x 2)     Status: None (Preliminary result)   Collection Time: 06/15/18  9:20 AM  Result Value Ref Range Status   Specimen Description BLOOD LEFT ANTECUBITAL  Final   Special Requests   Final    BOTTLES DRAWN AEROBIC ONLY Blood Culture results may not be optimal due to an inadequate volume of blood received in culture bottles   Culture   Final    NO GROWTH 4 DAYS Performed at [facility], [address], [place], [place] [address]    Report Status PENDING  Incomplete  Body fluid culture     Status: None   Collection Time: 06/16/18 11:16 AM  Result Value Ref Range Status   Specimen Description SYNOVIAL RIGHT SHOULDER  Final   Special Requests NONE  Final   Gram Stain   Final    RARE WBC PRESENT, PREDOMINANTLY PMN NO ORGANISMS SEEN CRITICAL RESULT CALLED TO, READ BACK BY AND VERIFIED WITH: DR JEFFERY AT 1157 ON [id number] BY SJW    Culture   Final    NO GROWTH 3 DAYS Performed at [facility], [address], [place], [place] [address]    Report Status 06/19/2018 FINAL  Final  Body fluid culture (includes gram stain)     Status:  None (Preliminary result)   Collection Time: 06/19/18  4:07 PM  Result Value Ref Range Status   Specimen Description PLEURAL  Final   Special Requests NONE  Final   Gram Stain   Final    FEW WBC PRESENT,BOTH PMN AND MONONUCLEAR NO ORGANISMS SEEN    Culture   Final    NO GROWTH < 24 HOURS Performed at [facility], [address] [place], [place] [address]    Report Status PENDING  Incomplete         Radiology Studies: Ct Abdomen Pelvis W Contrast  Result Date: 06/19/2018 CLINICAL DATA:  Right upper quadrant abdominal pain and distention. Septic pulmonary emboli from endocarditis. Chronic hepatitis-C and IV drug abuse. EXAM: CT ABDOMEN AND PELVIS WITH CONTRAST TECHNIQUE: Multidetector CT imaging of the abdomen and pelvis was performed using the standard protocol following bolus administration of intravenous contrast. CONTRAST:  OMNIPAQUE IOHEXOL 300 MG/ML  SOLN COMPARISON:  06/08/2018 FINDINGS: Lower Chest: Small bilateral pleural effusions and cavitary pulmonary nodules in both lung bases again seen. Hepatobiliary: No hepatic masses identified. 1 cm cyst again seen within the anterior right hepatic lobe. Gallbladder is unremarkable. Pancreas:  No mass or inflammatory changes. Spleen: Within normal limits in size and appearance. Adrenals/Urinary Tract: No masses identified. No evidence of hydronephrosis. Unremarkable unopacified urinary bladder. Stomach/Bowel: No evidence of obstruction, inflammatory process or abnormal fluid collections. Normal appendix visualized. Vascular/Lymphatic: No pathologically enlarged lymph nodes. No abdominal aortic aneurysm. Reproductive:  No mass or other significant abnormality. Other: New mild ascites and diffuse mesenteric and body wall edema since prior exam. Musculoskeletal:  No suspicious bone lesions identified. IMPRESSION: New mild ascites and diffuse mesenteric and body wall edema, consistent with 3rd spacing. No focal inflammatory process  or abscess within the abdomen or pelvis. No significant change in small bilateral pleural effusions, and cavitary lung nodules consistent with known septic emboli. Electronically Signed   By: Myles Rosenthal M.D.   On: 06/19/2018 19:45   Dg Chest Port 1 View  Result Date: 06/20/2018  CLINICAL DATA:  Hypoxia.  Shortness of breath for the past week. EXAM: PORTABLE CHEST 1 VIEW COMPARISON:  06/19/2018; 06/08/2018; chest CT-06/16/2018 FINDINGS: Grossly unchanged enlarged cardiac silhouette and mediastinal contours with thickening the right paratracheal stripe. Suspected slightly increased conspicuity of bilateral lower lobe predominant nodular airspace opacities. Interval increase in size of small right-sided effusion with associated worsening right basilar heterogeneous/consolidative opacities. No pneumothorax. No evidence of edema. No acute osseus abnormalities. IMPRESSION: 1. Suspected slight worsening of extensive bilateral nodular airspace opacities worrisome for septic emboli. 2. Interval increase in size of small right-sided effusion. Electronically Signed   By: Simonne Come M.D.   On: 06/20/2018 08:09   Dg Chest Port 1 View  Result Date: 06/19/2018 Delaine Lame, MD     06/19/2018  4:27 PM INDICATION: Pleural effusion, rule out empyema PROCEDURE OPERATOR: Liz Pinho ATTENDING PHYSICIAN: Glender Augusta  CONSENT: Consent was obtained from patient prior to the procedure. Indications, risks, and benefits were explained at length.  PROCEDURE SUMMARY: A time out was performed and the chest x-ray was reviewed, the appropriate side was confirmed with ultrasound guidance and marked. My hands were washed immediately prior to the procedure. I wore a surgical cap, mask and sterile gloves throughout the procedure. The patient was prepped and draped in a sterile manner using chlorhexidine scrub after the appropriate level was percussed and confirmed by ultrasound. 1% lidocaine was used to anesthesize the skin,  subcutaneous tissue, superior aspect of the rib periosteum and parietal pleura. A finder needle was then introduced over the superior aspect of the rib to locate the pleural fluid; serosanguineous cloudy colored fluid was aspirated. A 10-blade scalpel was used to nick the skin at the insertion site. The Safe-t-Centesis needle was then introduced through the skin incision into the pleural space using negative aspiration pressure and the red colometric indicator to confirm appropriate positioning of the needle. The thoracentesis catheter was then threaded without difficulty.  550 ml of cloudy serosanguineous colored fluid was removed without difficulty. The catheter was then removed. No immediate complications were noted during the procedure. A post-procedure chest x-ray is pending at the time of this note. The fluid will be sent for studies.  Estimated blood loss is minimal.  Dg Abd 2 Views  Result Date: 06/19/2018 CLINICAL DATA:  Abdominal pain EXAM: ABDOMEN - 2 VIEW COMPARISON:  Chest CT dated 06/16/2018. FINDINGS: Moderate amount of gas and stool throughout the colon, with associated mild distention of the LEFT colon. No evidence of small bowel obstruction. No evidence of soft tissue mass or abnormal fluid collection. No evidence of free intraperitoneal air. No evidence of renal or ureteral calculi. No acute or suspicious osseous finding. RIGHT pleural effusion, as also seen on the recent chest CT. Streaky opacities at the LEFT lung base correspond to the presumed septic emboli and associated consolidations described on recent chest CT. IMPRESSION: Overall nonobstructive bowel gas pattern. Moderate amount of gas and stool throughout the colon, with associated mild distention of the LEFT colon. Electronically Signed   By: Bary Richard M.D.   On: 06/19/2018 13:01        Scheduled Meds: . buprenorphine-naloxone  1 tablet Sublingual Daily  . enoxaparin (LOVENOX) injection  40 mg Subcutaneous Q24H  .  iopamidol  20 mL Intra-articular Once  . mouth rinse  15 mL Mouth Rinse BID  . polyethylene glycol  17 g Oral Daily  . risperiDONE  1 mg Oral QHS  . senna  2 tablet Oral Daily  Continuous Infusions: . sodium chloride 250 mL (06/15/18 1730)  .  ceFAZolin (ANCEF) IV 2 g (06/20/18 1013)     LOS: 12 days    Time spent: 35    Delaine Lame, MD [facility]  If 7PM-7AM, please contact night-coverage [email or web address] Password [email or web address] 06/20/2018, 12:05 PM

## 2018-06-20 NOTE — Progress Notes (Signed)
Late entry: Pt reported upper right side chest pain with deep breath.  Pt was administered tramadol per orders.  Sats 90-92% on room air with diminished breath sounds bil. Humidified oxygen applied per 2L [place] with sats 95%.  Pt willing to put oxygen on but will not keep it on.  Per verbal radiology report, pt does not have pneumothorax  post thoracentesis.  NP notified with orders for toradol x 1 - pt did not want at time of order.  Will cont to monitor.

## 2018-06-20 NOTE — Progress Notes (Addendum)
INFECTIOUS DISEASE PROGRESS NOTE  ID: Oscar Burke is a 37 y.o. male with  Principal Problem:   Endocarditis of tricuspid valve Active Problems:   Acute on chronic respiratory failure with hypoxia (HCC)   Polysubstance abuse (HCC)   Alcohol dependence with unspecified alcohol-induced disorder (HCC)   Abdominal pain   Hepatitis C, chronic (HCC)   Septic embolism (HCC)   Multifocal pneumonia   Tobacco abuse   Substance induced mood disorder (HCC)   Empyema lung (HCC)   MSSA bacteremia  Subjective: No complaints.   Abtx:  Anti-infectives (From admission, onward)   Start     Dose/Rate Route Frequency Ordered Stop   06/09/18 1400  ceFAZolin (ANCEF) IVPB 2g/100 mL premix     2 g 200 mL/hr over 30 Minutes Intravenous Every 8 hours 06/09/18 0920     06/08/18 1800  vancomycin (VANCOCIN) IVPB 750 mg/150 ml premix  Status:  Discontinued     750 mg 150 mL/hr over 60 Minutes Intravenous Every 8 hours 06/08/18 1448 06/09/18 0920   06/08/18 1445  ceFEPIme (MAXIPIME) 1 g in sodium chloride 0.9 % 100 mL IVPB  Status:  Discontinued     1 g 200 mL/hr over 30 Minutes Intravenous Every 8 hours 06/08/18 1436 06/09/18 0920   06/08/18 0845  vancomycin (VANCOCIN) 1,500 mg in sodium chloride 0.9 % 500 mL IVPB     1,500 mg 250 mL/hr over 120 Minutes Intravenous  Once 06/08/18 0828 06/08/18 1222   06/08/18 0830  ceFEPIme (MAXIPIME) 2 g in sodium chloride 0.9 % 100 mL IVPB     2 g 200 mL/hr over 30 Minutes Intravenous  Once 06/08/18 0825 06/08/18 0915   06/08/18 0830  metroNIDAZOLE (FLAGYL) IVPB 500 mg  Status:  Discontinued     500 mg 100 mL/hr over 60 Minutes Intravenous Every 8 hours 06/08/18 0825 06/08/18 2103   06/08/18 0830  vancomycin (VANCOCIN) IVPB 1000 mg/200 mL premix  Status:  Discontinued     1,000 mg 200 mL/hr over 60 Minutes Intravenous  Once 06/08/18 0825 06/08/18 0827      Medications:  Scheduled: . buprenorphine-naloxone  1 tablet Sublingual Daily  . enoxaparin  (LOVENOX) injection  40 mg Subcutaneous Q24H  . iopamidol  20 mL Intra-articular Once  . mouth rinse  15 mL Mouth Rinse BID  . polyethylene glycol  17 g Oral Daily  . risperiDONE  1 mg Oral QHS  . senna  2 tablet Oral Daily    Objective: Vital signs in last 24 hours: Temp:  [97.9 F (36.6 C)-100.2 F (37.9 C)] 97.9 F (36.6 C) (10/14 0544) Pulse Rate:  [80-100] 80 (10/14 0800) Resp:  [15-24] 15 (10/14 0800) BP: (95-122)/(66-87) 101/69 (10/14 0800) SpO2:  [93 %-97 %] 97 % (10/14 0800) Weight:  [78.9 kg] 78.9 kg (10/14 0544)   General appearance: alert, cooperative and no distress Resp: clear to auscultation bilaterally Cardio: regular rate and rhythm GI: normal findings: bowel sounds normal and soft, non-tender  Lab Results Recent Labs    06/19/18 0326 06/20/18 0357  WBC 17.6* 15.7*  HGB 8.0* 8.2*  HCT 25.3* 27.5*  NA 131* 130*  K 4.9 4.5  CL 95* 94*  CO2 27 29  BUN 18 22*  CREATININE 1.10 1.42*   Liver Panel Recent Labs    06/19/18 0326 06/20/18 0357  PROT 5.8* 6.6  ALBUMIN 1.4* 1.5*  AST 89* 100*  ALT 29 33  ALKPHOS 140* 135*  BILITOT 0.7 0.5   Sedimentation  Rate No results for input(s): ESRSEDRATE in the last 72 hours. C-Reactive Protein No results for input(s): CRP in the last 72 hours.  Microbiology: Recent Results (from the past 240 hour(s))  MRSA PCR Screening     Status: None   Collection Time: 06/13/18  7:55 PM  Result Value Ref Range Status   MRSA by PCR NEGATIVE NEGATIVE Final    Comment:        The GeneXpert MRSA Assay (FDA approved for NASAL specimens only), is one component of a comprehensive MRSA colonization surveillance program. It is not intended to diagnose MRSA infection nor to guide or monitor treatment for MRSA infections. Performed at [facility], [address] [place], [place] [address]   Culture, blood (routine x 2)     Status: None (Preliminary result)   Collection Time: 06/15/18  9:10 AM  Result Value Ref  Range Status   Specimen Description BLOOD LEFT ANTECUBITAL  Final   Special Requests   Final    BOTTLES DRAWN AEROBIC ONLY Blood Culture results may not be optimal due to an inadequate volume of blood received in culture bottles   Culture   Final    NO GROWTH 4 DAYS Performed at [facility], [address] [place], [place] [address]    Report Status PENDING  Incomplete  Culture, blood (routine x 2)     Status: None (Preliminary result)   Collection Time: 06/15/18  9:20 AM  Result Value Ref Range Status   Specimen Description BLOOD LEFT ANTECUBITAL  Final   Special Requests   Final    BOTTLES DRAWN AEROBIC ONLY Blood Culture results may not be optimal due to an inadequate volume of blood received in culture bottles   Culture   Final    NO GROWTH 4 DAYS Performed at [facility], [address], [place], [place] [address]    Report Status PENDING  Incomplete  Body fluid culture     Status: None   Collection Time: 06/16/18 11:16 AM  Result Value Ref Range Status   Specimen Description SYNOVIAL RIGHT SHOULDER  Final   Special Requests NONE  Final   Gram Stain   Final    RARE WBC PRESENT, PREDOMINANTLY PMN NO ORGANISMS SEEN CRITICAL RESULT CALLED TO, READ BACK BY AND VERIFIED WITH: DR JEFFERY AT 1157 ON [id number] BY SJW    Culture   Final    NO GROWTH 3 DAYS Performed at [facility], [address], [place], [place] [address]    Report Status 06/19/2018 FINAL  Final  Body fluid culture (includes gram stain)     Status: None (Preliminary result)   Collection Time: 06/19/18  4:07 PM  Result Value Ref Range Status   Specimen Description PLEURAL  Final   Special Requests NONE  Final   Gram Stain   Final    FEW WBC PRESENT,BOTH PMN AND MONONUCLEAR NO ORGANISMS SEEN    Culture   Final    NO GROWTH < 24 HOURS Performed at [facility], [address] [place], [place] [address]    Report Status PENDING  Incomplete    Studies/Results: Ct Abdomen Pelvis W  Contrast  Result Date: 06/19/2018 CLINICAL DATA:  Right upper quadrant abdominal pain and distention. Septic pulmonary emboli from endocarditis. Chronic hepatitis-C and IV drug abuse. EXAM: CT ABDOMEN AND PELVIS WITH CONTRAST TECHNIQUE: Multidetector CT imaging of the abdomen and pelvis was performed using the standard protocol following  bolus administration of intravenous contrast. CONTRAST:  OMNIPAQUE IOHEXOL 300 MG/ML  SOLN COMPARISON:  06/08/2018 FINDINGS: Lower Chest: Small bilateral pleural effusions and cavitary pulmonary nodules in both lung bases again seen. Hepatobiliary: No hepatic masses identified. 1 cm cyst again seen within the anterior right hepatic lobe. Gallbladder is unremarkable. Pancreas:  No mass or inflammatory changes. Spleen: Within normal limits in size and appearance. Adrenals/Urinary Tract: No masses identified. No evidence of hydronephrosis. Unremarkable unopacified urinary bladder. Stomach/Bowel: No evidence of obstruction, inflammatory process or abnormal fluid collections. Normal appendix visualized. Vascular/Lymphatic: No pathologically enlarged lymph nodes. No abdominal aortic aneurysm. Reproductive:  No mass or other significant abnormality. Other: New mild ascites and diffuse mesenteric and body wall edema since prior exam. Musculoskeletal:  No suspicious bone lesions identified. IMPRESSION: New mild ascites and diffuse mesenteric and body wall edema, consistent with 3rd spacing. No focal inflammatory process or abscess within the abdomen or pelvis. No significant change in small bilateral pleural effusions, and cavitary lung nodules consistent with known septic emboli. Electronically Signed   By: Myles Rosenthal M.D.   On: 06/19/2018 19:45   Dg Chest Port 1 View  Result Date: 06/20/2018 CLINICAL DATA:  Hypoxia.  Shortness of breath for the past week. EXAM: PORTABLE CHEST 1 VIEW COMPARISON:  06/19/2018; 06/08/2018; chest CT-06/16/2018 FINDINGS: Grossly unchanged enlarged  cardiac silhouette and mediastinal contours with thickening the right paratracheal stripe. Suspected slightly increased conspicuity of bilateral lower lobe predominant nodular airspace opacities. Interval increase in size of small right-sided effusion with associated worsening right basilar heterogeneous/consolidative opacities. No pneumothorax. No evidence of edema. No acute osseus abnormalities. IMPRESSION: 1. Suspected slight worsening of extensive bilateral nodular airspace opacities worrisome for septic emboli. 2. Interval increase in size of small right-sided effusion. Electronically Signed   By: Simonne Come M.D.   On: 06/20/2018 08:09   Dg Chest Port 1 View  Result Date: 06/19/2018 Delaine Lame, MD     06/19/2018  4:27 PM INDICATION: Pleural effusion, rule out empyema PROCEDURE OPERATOR: Shrey Purohit ATTENDING PHYSICIAN: Shrey Purohit  CONSENT: Consent was obtained from patient prior to the procedure. Indications, risks, and benefits were explained at length.  PROCEDURE SUMMARY: A time out was performed and the chest x-ray was reviewed, the appropriate side was confirmed with ultrasound guidance and marked. My hands were washed immediately prior to the procedure. I wore a surgical cap, mask and sterile gloves throughout the procedure. The patient was prepped and draped in a sterile manner using chlorhexidine scrub after the appropriate level was percussed and confirmed by ultrasound. 1% lidocaine was used to anesthesize the skin, subcutaneous tissue, superior aspect of the rib periosteum and parietal pleura. A finder needle was then introduced over the superior aspect of the rib to locate the pleural fluid; serosanguineous cloudy colored fluid was aspirated. A 10-blade scalpel was used to nick the skin at the insertion site. The Safe-t-Centesis needle was then introduced through the skin incision into the pleural space using negative aspiration pressure and the red colometric indicator to confirm  appropriate positioning of the needle. The thoracentesis catheter was then threaded without difficulty.  550 ml of cloudy serosanguineous colored fluid was removed without difficulty. The catheter was then removed. No immediate complications were noted during the procedure. A post-procedure chest x-ray is pending at the time of this note. The fluid will be sent for studies.  Estimated blood loss is minimal.  Dg Abd 2 Views  Result Date: 06/19/2018 CLINICAL DATA:  Abdominal pain EXAM:  ABDOMEN - 2 VIEW COMPARISON:  Chest CT dated 06/16/2018. FINDINGS: Moderate amount of gas and stool throughout the colon, with associated mild distention of the LEFT colon. No evidence of small bowel obstruction. No evidence of soft tissue mass or abnormal fluid collection. No evidence of free intraperitoneal air. No evidence of renal or ureteral calculi. No acute or suspicious osseous finding. RIGHT pleural effusion, as also seen on the recent chest CT. Streaky opacities at the LEFT lung base correspond to the presumed septic emboli and associated consolidations described on recent chest CT. IMPRESSION: Overall nonobstructive bowel gas pattern. Moderate amount of gas and stool throughout the colon, with associated mild distention of the LEFT colon. Electronically Signed   By: Bary Richard M.D.   On: 06/19/2018 13:01     Assessment/Plan: IVDA MSSA bacteremia TV IE Hepatitis C  Total days of antibiotics: 12 cefazolin  Repeat BCx 10-12/14/08 are negative.  Would aim for 6 weeks of ancef. If he does not stay in hospital for 6 weeks, would give him ancef as long as we can in house.  Would consider a one time dose of dalbavancin or oritavancin prior to d/c if prior to 6 weeks of therapy. This will give him 2 weeks of anbx coverage.   Then transition to po keflex for 6 weeks outpt.  I would not send him out of hospital with [facility] line.  Continue suboxone.  Temps improved.  I explained this to pt, gave him instructions on  how to f/u in our clinic and that we can treat him (cure him) of Hep C if he comes to our clinic. I let him know that the Hep C treatment would be free.  No need for emergent CVTS per prev CV note.  Available as needed.          Johny Sax MD, FACP Infectious Diseases (pager) [phone] [email or web address] 06/20/2018, 2:00 PM  LOS: 12 days

## 2018-06-20 NOTE — Plan of Care (Signed)
  Problem: Education: Goal: Knowledge of General Education information will improve Description: Including pain rating scale, medication(s)/side effects and non-pharmacologic comfort measures Outcome: Progressing   Problem: Clinical Measurements: Goal: Respiratory complications will improve Outcome: Completed/Met   

## 2018-06-21 LAB — PATHOLOGIST SMEAR REVIEW: Path Review: REACTIVE

## 2018-06-21 LAB — CULTURE, BLOOD (ROUTINE X 2)
Culture: NO GROWTH
Culture: NO GROWTH

## 2018-06-21 LAB — COMPREHENSIVE METABOLIC PANEL WITH GFR
Albumin: 1.2 g/dL — ABNORMAL LOW (ref 3.5–5.0)
BUN: 21 mg/dL — ABNORMAL HIGH (ref 6–20)
Glucose, Bld: 87 mg/dL (ref 70–99)
Potassium: 4.9 mmol/L (ref 3.5–5.1)
Sodium: 132 mmol/L — ABNORMAL LOW (ref 135–145)
Total Bilirubin: 0.5 mg/dL (ref 0.3–1.2)
Total Protein: 5.3 g/dL — ABNORMAL LOW (ref 6.5–8.1)

## 2018-06-21 LAB — CBC
HCT: 24.1 % — ABNORMAL LOW (ref 39.0–52.0)
Hemoglobin: 7.3 g/dL — ABNORMAL LOW (ref 13.0–17.0)
MCH: 27.1 pg (ref 26.0–34.0)
MCHC: 30.3 g/dL (ref 30.0–36.0)
MCV: 89.6 fL (ref 80.0–100.0)
Platelets: 416 10*3/uL — ABNORMAL HIGH (ref 150–400)
RBC: 2.69 MIL/uL — ABNORMAL LOW (ref 4.22–5.81)
RDW: 13.7 % (ref 11.5–15.5)
WBC: 12.6 10*3/uL — ABNORMAL HIGH (ref 4.0–10.5)
nRBC: 0 % (ref 0.0–0.2)

## 2018-06-21 LAB — COMPREHENSIVE METABOLIC PANEL
ALT: 23 U/L (ref 0–44)
AST: 60 U/L — ABNORMAL HIGH (ref 15–41)
Alkaline Phosphatase: 119 U/L (ref 38–126)
Anion gap: 3 — ABNORMAL LOW (ref 5–15)
CO2: 29 mmol/L (ref 22–32)
Calcium: 7.9 mg/dL — ABNORMAL LOW (ref 8.9–10.3)
Chloride: 100 mmol/L (ref 98–111)
Creatinine, Ser: 1.09 mg/dL (ref 0.61–1.24)
GFR calc Af Amer: >60 mL/min (ref >60)
GFR calc non Af Amer: >60 mL/min (ref >60)

## 2018-06-21 LAB — MAGNESIUM: Magnesium: 1.8 mg/dL (ref 1.7–2.4)

## 2018-06-21 MED ORDER — FERROUS SULFATE 325 (65 FE) MG PO TABS: 325.0000 mg | ORAL_TABLET | Freq: Every day | ORAL | Status: DC

## 2018-06-21 NOTE — Progress Notes (Signed)
Pt continued expressing desire to leave facility AMA.  Several attempts made to encourage pt to complete IV ABT therapy and the risks for not completing therapy.  Pt continued to complain of being "trapped" and "held hostage".  Pt finally decided that he wants to leave AMA.  Dr. Clearnce Sorrel, attending, paged.  Pt's IV access and monitors discontinued.  Pt discharged AMA at 1844hrs.

## 2018-06-21 NOTE — Progress Notes (Signed)
PROGRESS NOTE    Oscar Burke  ZOX:[id number] DOB: 1980-10-17 DOA: 06/08/2018 PCP: Patient, No Pcp Per    Brief Narrative:  37 year old with past medical history relevant for IV heroin abuse, chronic hepatitis C who presented to the emergency department with fevers and body aches as well as whitish sputum and tachypnea and acute hypoxic respiratory failure and found to have septic pulmonary emboli and MSSA tricuspid valve endocarditis.   Assessment & Plan:   Principal Problem:   Endocarditis of tricuspid valve Active Problems:   Acute on chronic respiratory failure with hypoxia (HCC)   Polysubstance abuse (HCC)   Alcohol dependence with unspecified alcohol-induced disorder (HCC)   Abdominal pain   Hepatitis C, chronic (HCC)   Septic embolism (HCC)   Multifocal pneumonia   Tobacco abuse   Substance induced mood disorder (HCC)   Empyema lung (HCC)   MSSA bacteremia   #) MSSA tricuspid valve endocarditis complicated by septic emboli: Patient continues to have a fever.  Currently the source is unclear.  Please see below for all the imaging studies.  Patient is status post a thoracentesis on 06/19/2018 -Infectious diseases following, appreciate recommendations -MRI of thoracic and lumbar spine on 06/15/2018 shows no evidence of metastatic disease -MRI right shoulder on 06/15/2018 shows shoulder effusion and large abscess along subscapularis -Chest CT on 06/16/2018 shows septic emboli and empyema versus parapneumonic effusion -Foot MRI on 06/16/2018 shows evidence of soft tissue wound and tissue edema but no evidence of abscess or hematoma - Blood cultures from 06/08/2018 growing MSSA -Blood cultures from 06/10/2018 negative -Blood cultures from 06/15/2018 no growth to date -Follow-up cultures from thoracentesis on 06/19/2018 -Shoulder arthrocentesis cultures from 06/16/2018 no growth to date -Continue IV cefazolin started 06/2018, per infectious disease -Patient had bedside  thoracentesis on 06/19/2018 which appears to be consistent with pleural effusion rather than empyema  #) Abdominal pain: Resolved.  Abdominal x-ray was mildly abnormal with some dilated loops.  Patient reports his abdominal pain is gone now he is eating well with good bowel movements and gas. -CT abdomen pelvis on 06/19/2018 showed some evidence of ascites and bowel wall edema but no acute process.  #) Right shoulder effusion status post arthrocentesis on 06/16/2018 by interventional radiology: Noted on MRI on 06/15/2018 -Orthopedic surgery consulted, felt that with patient's movement intact effusion was more more likely to be reactive rather than septic arthritis - Interventional radiology consulted for arthrocentesis - Minimal fluid was obtained and so differential was not sent off only culture  #) Iron deficiency  anemia: -Continue iron sulfate 325 mg daily - B12 and folate normal -Low iron, TIBC, T sat consistent with iron deficiency, elevated.  Likely secondary inflammation -LDH is normal, low suspicion for hemolysis  #) Polysubstance abuse: -Continue Suboxone 1 tablet daily  #) Pain/psych: -Continue PRN cycle Benzapril clonazepam  #) Acute hypoxic respiratory failure: Resolved  #) Chronic hep C: -Outpatient treatment  Fluids: Tolerating p.o. Electrodes: Monitor and supplement Nutrition: Regular diet  Prophylaxis: Enoxaparin  Disposition: Pending completion of IV antibiotics and resolution of fevers  Full code   Consultants:   Infectious disease  Cardio thoracic surgery  Cardiology  Procedures:  06/08/2018 echo:- Left ventricle: The cavity size was normal. Wall thickness was   normal. Systolic function was normal. The estimated ejection   fraction was in the range of 55% to 60%. Wall motion was normal;   there were no regional wall motion abnormalities. Left   ventricular diastolic function parameters were normal. - Ventricular septum:  Very mildly D-shaped  interventricular septum   is concerning for RV pressure/volume overload. - Aortic valve: There was no stenosis. - Mitral valve: There was no regurgitation. - Right ventricle: The cavity size was mildly dilated. Systolic   function was normal. - Right atrium: The atrium was mildly dilated. - Tricuspid valve: There was mild-moderate regurgitation. Suspected   tricuspid vegetation. Peak RV-RA gradient (S): 25 mm Hg. - Pulmonary arteries: PA peak pressure: 33 mm Hg (S). - Systemic veins: IVC measured 2.3 cm with > 50% respirophasic   variation, suggesting RA pressure 8 mmHg. - Pericardium, extracardiac: Small circumferential pericardial   effusion.  Impressions:  - Normal LV size and systolic function, EF 55-60%. Normal diastolic   function. Mildly D-shaped interventricular septum suggesting a   degree of RV pressure/volume overload. Mildly dilated RV with   normal systolic function. Mild to moderate TR. There does appear    to be a tricuspid valve vegetation. Need TEE to confirm.   06/13/2018 TEE:- Left ventricle: Systolic function was normal. The estimated   ejection fraction was in the range of 60% to 65%. - Aortic valve: No evidence of vegetation. - Mitral valve: No evidence of vegetation. - Left atrium: No evidence of thrombus in the atrial cavity or   appendage. - Right atrium: No evidence of thrombus in the atrial cavity or   appendage. - Atrial septum: No defect or patent foramen ovale was identified. - Tricuspid valve: Large vegetation on the lateral leaflet of the   TV measuring 2 cm in longest dimension. There was moderate-severe   regurgitation. - Pulmonic valve: No evidence of vegetation.  06/16/2018 arthrocentesis of right shoulder with interventional radiology  06/19/2018 bedside thoracentesis  Antimicrobials:   IV cefazolin started 06/08/2018   Subjective: Patient is doing fairly well today.  He does not have any complaints other than he is tired of  staying in the hospital.  He will discuss with infectious disease about alternative treatments.  Objective: Vitals:   06/20/18 1300 06/20/18 2202 06/21/18 0041 06/21/18 0455  BP:  111/82  109/80  Pulse: 81 (!) 103  85  Resp: 16 (!) 22    Temp: 98 F (36.7 C) (!) 100.7 F (38.2 C) 99 F (37.2 C) 98.9 F (37.2 C)  TempSrc: Axillary Oral Oral Oral  SpO2:  93%  97%  Weight:    80.4 kg  Height:        Intake/Output Summary (Last 24 hours) at 06/21/2018 1255 Last data filed at 06/21/2018 1237 Gross per 24 hour  Intake 4982.28 ml  Output 1300 ml  Net 3682.28 ml   Filed Weights   06/19/18 0441 06/20/18 0544 06/21/18 0455  Weight: 78.4 kg 78.9 kg 80.4 kg    Examination:  General exam: Appears calm and comfortable  Respiratory system: Clear to auscultation. Respiratory effort normal.  Diminished lung sounds at bases Cardiovascular system: Regular rate and rhythm, 2 out of 6 systolic murmur heard best at left upper sternal border Gastrointestinal system: Abdomen soft, nondistended, no rebound or guarding, plus bowel sounds Central nervous system: Alert and oriented. No focal neurological deficits. Extremities: No lower extremity edema.  No midline tenderness over back and shoulder on right,  Skin: Metastatic disease appreciated over extremities Psychiatry: Judgement and insight appear poor. Mood & affect appropriate.     Data Reviewed: I have personally reviewed following labs and imaging studies  CBC: Recent Labs  Lab 06/17/18 0534 06/18/18 0435 06/19/18 0326 06/19/18 1124 06/20/18 0357 06/21/18 1610  WBC 17.5* 15.8* 17.6*  --  15.7* 12.6*  HGB 7.3* 7.0* 8.0*  --  8.2* 7.3*  HCT 23.0* 22.4* 25.3* 25.3* 27.5* 24.1*  MCV 89.1 89.6 88.2  --  90.5 89.6  PLT 391 406* 435*  --  473* 416*   Basic Metabolic Panel: Recent Labs  Lab 06/16/18 0341 06/17/18 0534 06/18/18 0435 06/19/18 0326 06/20/18 0357 06/21/18 0721  NA 129* 131* 130* 131* 130* 132*  K 4.9 5.1 5.3*  4.9 4.5 4.9  CL 95* 94* 95* 95* 94* 100  CO2 29 31 30 27 29 29   GLUCOSE 116* 113* 99 96 112* 87  BUN 29* 23* 19 18 22* 21*  CREATININE 1.37* 1.23 1.15 1.10 1.42* 1.09  CALCIUM 7.7* 8.0* 7.9* 7.9* 8.0* 7.9*  MG 2.0 1.8  --  1.5* 2.0 1.8   GFR: Estimated Creatinine Clearance: 96.9 mL/min (by C-G formula based on SCr of 1.09 mg/dL). Liver Function Tests: Recent Labs  Lab 06/15/18 0423 06/16/18 0341 06/18/18 1038 06/19/18 0326 06/20/18 0357 06/21/18 0721  AST 46* 49*  --  89* 100* 60*  ALT 11 13  --  29 33 23  ALKPHOS 136* 137*  --  140* 135* 119  BILITOT 0.8 0.7  --  0.7 0.5 0.5  PROT 5.9* 5.6* 5.5* 5.8* 6.6 5.3*  ALBUMIN 1.3* 1.3*  --  1.4* 1.5* 1.2*   No results for input(s): LIPASE, AMYLASE in the last 168 hours. No results for input(s): AMMONIA in the last 168 hours. Coagulation Profile: No results for input(s): INR, PROTIME in the last 168 hours. Cardiac Enzymes: No results for input(s): CKTOTAL, CKMB, CKMBINDEX, TROPONINI in the last 168 hours. BNP (last 3 results) No results for input(s): PROBNP in the last 8760 hours. HbA1C: No results for input(s): HGBA1C in the last 72 hours. CBG: No results for input(s): GLUCAP in the last 168 hours. Lipid Profile: No results for input(s): CHOL, HDL, LDLCALC, TRIG, CHOLHDL, LDLDIRECT in the last 72 hours. Thyroid Function Tests: No results for input(s): TSH, T4TOTAL, FREET4, T3FREE, THYROIDAB in the last 72 hours. Anemia Panel: Recent Labs    06/19/18 1124  VITAMINB12 819  FERRITIN 611*  TIBC 175*  IRON 13*  RETICCTPCT 1.4   Sepsis Labs: No results for input(s): PROCALCITON, LATICACIDVEN in the last 168 hours.  Recent Results (from the past 240 hour(s))  MRSA PCR Screening     Status: None   Collection Time: 06/13/18  7:55 PM  Result Value Ref Range Status   MRSA by PCR NEGATIVE NEGATIVE Final    Comment:        The GeneXpert MRSA Assay (FDA approved for NASAL specimens only), is one component of  a comprehensive MRSA colonization surveillance program. It is not intended to diagnose MRSA infection nor to guide or monitor treatment for MRSA infections. Performed at [facility], [address], [place], [place] [address]   Culture, blood (routine x 2)     Status: None (Preliminary result)   Collection Time: 06/15/18  9:10 AM  Result Value Ref Range Status   Specimen Description BLOOD LEFT ANTECUBITAL  Final   Special Requests   Final    BOTTLES DRAWN AEROBIC ONLY Blood Culture results may not be optimal due to an inadequate volume of blood received in culture bottles   Culture   Final    NO GROWTH 4 DAYS Performed at [facility], [address] [place], [place] [address]    Report Status  PENDING  Incomplete  Culture, blood (routine x 2)     Status: None (Preliminary result)   Collection Time: 06/15/18  9:20 AM  Result Value Ref Range Status   Specimen Description BLOOD LEFT ANTECUBITAL  Final   Special Requests   Final    BOTTLES DRAWN AEROBIC ONLY Blood Culture results may not be optimal due to an inadequate volume of blood received in culture bottles   Culture   Final    NO GROWTH 4 DAYS Performed at [facility], [address] [place], [place] [address]    Report Status PENDING  Incomplete  Body fluid culture     Status: None   Collection Time: 06/16/18 11:16 AM  Result Value Ref Range Status   Specimen Description SYNOVIAL RIGHT SHOULDER  Final   Special Requests NONE  Final   Gram Stain   Final    RARE WBC PRESENT, PREDOMINANTLY PMN NO ORGANISMS SEEN CRITICAL RESULT CALLED TO, READ BACK BY AND VERIFIED WITH: DR JEFFERY AT 1157 ON [id number] BY SJW    Culture   Final    NO GROWTH 3 DAYS Performed at [facility], [address] [place], [place] [address]    Report Status 06/19/2018 FINAL  Final  Body fluid culture (includes gram stain)     Status: None (Preliminary result)   Collection Time: 06/19/18  4:07 PM  Result Value Ref Range  Status   Specimen Description PLEURAL  Final   Special Requests NONE  Final   Gram Stain   Final    FEW WBC PRESENT,BOTH PMN AND MONONUCLEAR NO ORGANISMS SEEN    Culture   Final    NO GROWTH 2 DAYS Performed at [facility], [address], [place], [place] [address]    Report Status PENDING  Incomplete         Radiology Studies: Ct Abdomen Pelvis W Contrast  Result Date: 06/19/2018 CLINICAL DATA:  Right upper quadrant abdominal pain and distention. Septic pulmonary emboli from endocarditis. Chronic hepatitis-C and IV drug abuse. EXAM: CT ABDOMEN AND PELVIS WITH CONTRAST TECHNIQUE: Multidetector CT imaging of the abdomen and pelvis was performed using the standard protocol following bolus administration of intravenous contrast. CONTRAST:  OMNIPAQUE IOHEXOL 300 MG/ML  SOLN COMPARISON:  06/08/2018 FINDINGS: Lower Chest: Small bilateral pleural effusions and cavitary pulmonary nodules in both lung bases again seen. Hepatobiliary: No hepatic masses identified. 1 cm cyst again seen within the anterior right hepatic lobe. Gallbladder is unremarkable. Pancreas:  No mass or inflammatory changes. Spleen: Within normal limits in size and appearance. Adrenals/Urinary Tract: No masses identified. No evidence of hydronephrosis. Unremarkable unopacified urinary bladder. Stomach/Bowel: No evidence of obstruction, inflammatory process or abnormal fluid collections. Normal appendix visualized. Vascular/Lymphatic: No pathologically enlarged lymph nodes. No abdominal aortic aneurysm. Reproductive:  No mass or other significant abnormality. Other: New mild ascites and diffuse mesenteric and body wall edema since prior exam. Musculoskeletal:  No suspicious bone lesions identified. IMPRESSION: New mild ascites and diffuse mesenteric and body wall edema, consistent with 3rd spacing. No focal inflammatory process or abscess within the abdomen or pelvis. No significant change in small bilateral pleural  effusions, and cavitary lung nodules consistent with known septic emboli. Electronically Signed   By: Myles Rosenthal M.D.   On: 06/19/2018 19:45   Dg Chest Port 1 View  Result Date: 06/20/2018 CLINICAL DATA:  Hypoxia.  Shortness of breath for the past week. EXAM: PORTABLE CHEST 1 VIEW COMPARISON:  06/19/2018; 06/08/2018; chest CT-06/16/2018 FINDINGS: Grossly unchanged enlarged cardiac silhouette and mediastinal contours with thickening the right paratracheal stripe. Suspected slightly increased conspicuity of bilateral lower lobe predominant nodular airspace opacities. Interval increase in size of small right-sided effusion with associated worsening right basilar heterogeneous/consolidative opacities. No pneumothorax. No evidence of edema. No acute osseus abnormalities. IMPRESSION: 1. Suspected slight worsening of extensive bilateral nodular airspace opacities worrisome for septic emboli. 2. Interval increase in size of small right-sided effusion. Electronically Signed   By: Simonne Come M.D.   On: 06/20/2018 08:09   Dg Chest Port 1 View  Result Date: 06/19/2018 Delaine Lame, MD     06/19/2018  4:27 PM INDICATION: Pleural effusion, rule out empyema PROCEDURE OPERATOR: Oreoluwa Gilmer ATTENDING PHYSICIAN: Ronie Barnhart  CONSENT: Consent was obtained from patient prior to the procedure. Indications, risks, and benefits were explained at length.  PROCEDURE SUMMARY: A time out was performed and the chest x-ray was reviewed, the appropriate side was confirmed with ultrasound guidance and marked. My hands were washed immediately prior to the procedure. I wore a surgical cap, mask and sterile gloves throughout the procedure. The patient was prepped and draped in a sterile manner using chlorhexidine scrub after the appropriate level was percussed and confirmed by ultrasound. 1% lidocaine was used to anesthesize the skin, subcutaneous tissue, superior aspect of the rib periosteum and parietal pleura. A finder needle  was then introduced over the superior aspect of the rib to locate the pleural fluid; serosanguineous cloudy colored fluid was aspirated. A 10-blade scalpel was used to nick the skin at the insertion site. The Safe-t-Centesis needle was then introduced through the skin incision into the pleural space using negative aspiration pressure and the red colometric indicator to confirm appropriate positioning of the needle. The thoracentesis catheter was then threaded without difficulty.  550 ml of cloudy serosanguineous colored fluid was removed without difficulty. The catheter was then removed. No immediate complications were noted during the procedure. A post-procedure chest x-ray is pending at the time of this note. The fluid will be sent for studies.  Estimated blood loss is minimal.       Scheduled Meds: . buprenorphine-naloxone  1 tablet Sublingual Daily  . enoxaparin (LOVENOX) injection  40 mg Subcutaneous Q24H  . iopamidol  20 mL Intra-articular Once  . mouth rinse  15 mL Mouth Rinse BID  . polyethylene glycol  17 g Oral Daily  . risperiDONE  1 mg Oral QHS  . senna  2 tablet Oral Daily   Continuous Infusions: . sodium chloride Stopped (06/20/18 2052)  .  ceFAZolin (ANCEF) IV 2 g (06/21/18 1004)     LOS: 13 days    Time spent: 35    Delaine Lame, MD [facility]  If 7PM-7AM, please contact night-coverage [email or web address] Password [email or web address] 06/21/2018, 12:55 PM

## 2018-06-21 NOTE — Progress Notes (Signed)
Patient standing in hallway outside of room and stopped CSW to talk. CSW and patient went back in patient's room to discuss patient's concerns. Patient wanted to talk to his doctor about switching to PO antibiotics and discharging home. CSW reinforced that at this time, MD recommendation is for IV antibiotics. Patient concerned about getting home to help his significant other, as they just moved into a new home, and patient stated he needs to be there to help get things done at the new place. CSW reflected that if patient does not receive all of his treatment, he would not be healthy for his family at home. Patient acknowledged this, stating that his significant other had said the same thing.  CSW and patient discussed the substance use resources CSW provided last week. Patient indicated he had reviewed the list of treatment programs and had plans to follow up with some of them. CSW referred patient to [facility]; patient produced a business card from [place] and indicated he is already familiar with the program. Patient interested in individual counseling for substance use and trauma. CSW also referred patient to [facility] of the [place].  CSW will continue to follow for support.  Abigail Butts, LCSWA [phone]

## 2018-06-22 ENCOUNTER — Telehealth: Payer: Self-pay | Admitting: Behavioral Health

## 2018-06-22 ENCOUNTER — Telehealth: Payer: Self-pay | Admitting: Infectious Diseases

## 2018-06-22 DIAGNOSIS — I368 Other nonrheumatic tricuspid valve disorders: Principal | ICD-10-CM

## 2018-06-22 DIAGNOSIS — I079 Rheumatic tricuspid valve disease, unspecified: Secondary | ICD-10-CM

## 2018-06-22 LAB — BPAM RBC
Blood Product Expiration Date: 201911022359
Blood Product Expiration Date: 201911032359
ISSUE DATE / TIME: 201910121132
Unit Type and Rh: 6200
Unit Type and Rh: 6200

## 2018-06-22 LAB — TYPE AND SCREEN
ABO/RH(D): A POS
Antibody Screen: NEGATIVE
Unit division: 0
Unit division: 0

## 2018-06-22 LAB — BODY FLUID CULTURE: Culture: NO GROWTH

## 2018-06-22 MED ORDER — CEPHALEXIN 500 MG PO CAPS: 500.0000 mg | ORAL_CAPSULE | Freq: Three times a day (TID) | ORAL | 1 refills | Status: DC

## 2018-06-22 NOTE — Telephone Encounter (Signed)
Oscar Burke, called stating he left the hospital AMA and states Dr. Ninetta Lights told him to call him when he got out of the hospital.  Patient states he need advice on what he needs to do.  Patient states he is supposed to have antibiotics but since he left the hospital he no longer is on antibiotics and wanted Dr. Ninetta Lights to be aware.  Advise Oscar Burke to go back to [facility] Emergency room but he states he he did not like the care he was receiving at [facility]. He is using a room mates phone and the phone number is [phone].  Angeline Slim RN

## 2018-06-22 NOTE — Telephone Encounter (Signed)
Called pt about leaving AMA.  C/o LE edema, scrotal edema.  He also asks to have his benzo and pain meds refilled I advised him to be seen by PCP or establish with a PCP.  Will send him po anbx- keflex 500mg  tid for 6 weeks.  Advised him to go to ED if edema worse. He agrees.  He will call ID clinic to get in for Hep C treatment.

## 2018-06-22 NOTE — Telephone Encounter (Signed)
Called, rx sent.

## 2018-06-26 NOTE — Discharge Summary (Signed)
Physician Discharge Summary  Oscar Burke ZOX:[id number] DOB: 11-Sep-1980 DOA: 06/08/2018  PCP: Oscar Burke, No Pcp Per  Admit date: 06/08/2018 Discharge date: 06/26/2018  Admitted From: Home Disposition: AGAINST MEDICAL ADVICE  Recommendations for Outpatient Follow-up:  1. Follow up with PCP in 1-2 weeks 2. Please obtain BMP/CBC in one week 3. 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  Oscar Burke received a TEE that showed evidence of vegetation on tricuspid valve.  Oscar Burke was noted to have on CT a of the chest evidence of septic emboli.  Oscar Burke continued to have persistent fevers despite appropriate IV antibiotic therapy with cefazolin.  MRI of the lumbar spine showed no evidence of metastatic disease.  MRI of the right shoulder shoulder showed a shoulder effusion and an abscess along the subscapularis.  Oscar Burke had arthrocentesis on 06/16/2018 by interventional radiology at the request of orthopedic surgery with only small amount of fluid aspirated.  Orthopedic surgery felt that based on Oscar Burke's clinical exam that septic arthritis was unlikely of the shoulder.  Oscar Burke also had CT abdomen pelvis due to persistent fevers that showed some evidence of ascites and bowel wall edema but no acute process.  MRI of the foot on 06/16/2018 showed soft tissue wound and tissue edema but no evidence of abscess or hematoma.  Repeat blood cultures on 06/10/2018 and 06/15/2018 were negative for any growth on appropriate IV antibiotic therapy.  Oscar Burke received a thoracentesis on 06/19/2018 to rule out empyema.  This was felt to be primarily parapneumonic likely secondary  to septic emboli.  On 06/21/2018 Oscar Burke left AGAINST MEDICAL ADVICE despite multiple providers trying to tell the Oscar Burke not to leave.  #) Iron deficiency anemia: Oscar Burke was noted to be quite anemic.  Oscar Burke was started on iron supplementation as his iron, TIBC, T sat below.  Ferritin was elevated but this is likely secondary to acute phase reaction.  #) Polysubstance abuse: Oscar Burke was started on Suboxone.  This was not continued as an outpatient as Oscar Burke left before he could be given a prescription.  #) Pain/psych: Oscar Burke was maintained on PRN cycle Benzapril clonazepam.  #) Acute hypoxic respiratory failure: Oscar Burke initially required some supplemental oxygen on admission however his oxygen requirement resolved on appropriate IV antibiotic therapy.  #)  chronic hepatitis C: Oscar Burke was noted to have mildly elevated LFTs, history of IV drug use, hepatitis C antibody that was positive.  Outpatient treatment was planned.  Discharge Diagnoses:  Principal Problem:   Endocarditis of tricuspid valve Active Problems:   Acute on chronic respiratory failure with hypoxia (HCC)   Polysubstance abuse (HCC)   Alcohol dependence with unspecified alcohol-induced disorder (HCC)   Abdominal pain   Hepatitis C, chronic (HCC)   Septic embolism (HCC)   Multifocal pneumonia   Tobacco abuse   Substance induced mood disorder (HCC)   Empyema lung (HCC)   MSSA bacteremia    Discharge Instructions   Allergies as of 06/21/2018   No Known Allergies     Medication List    ASK your doctor about these medications   loperamide 2 MG capsule Commonly known as:  IMODIUM Take 1 capsule (2 mg total) by mouth 4 (four) times daily as needed for diarrhea  or loose stools.   meloxicam 7.5 MG tablet Commonly known as:  MOBIC Take 1 tablet (7.5 mg total) by mouth daily.   ondansetron 4 MG tablet Commonly known as:  ZOFRAN Take 1 tablet (4 mg total) by mouth every 8 (eight) hours as needed for nausea  or vomiting.   risperiDONE 2 MG tablet Commonly known as:  RISPERDAL Take 1 tablet (2 mg total) by mouth at bedtime.   traMADol 50 MG tablet Commonly known as:  ULTRAM Take 1 tablet (50 mg total) by mouth every 6 (six) hours as needed.      Follow-up Information    [facility].   Contact information: [address] [place] [address] [phone]          No Known Allergies  Consultations:  Infectious disease  Cardio thoracic surgery  Cardiology   Procedures/Studies: Dg Orthopantogram  Result Date: 06/15/2018 CLINICAL DATA:  Oscar Burke reports he has had blood clots in his heart breaking up. Oscar Burke denies any jaw pains. Prior injury with 3 fractures to his mandible. Claims to have 3 plates and 13 screws in his mandible. Oscar Burke reports no known tooth infections, but has had multiple broken teeth. EXAM: ORTHOPANTOGRAM/PANORAMIC COMPARISON:  None. FINDINGS: No acute fracture. There are fixation plates along the posterior right mandibular body and angle and anterior left mandibular body extending to the left symphysis. Orthopedic hardware appears well seated. No bone lesion. There is a small area periapical lucency at the base of the left mandibular lateral incisor. There are missing maxillary teeth, 1 and 16, and missing mandibular teeth, 17, 18, 19, 30 and 32. IMPRESSION: 1. No acute fracture.  No bone lesion. 2. Small area periapical lucency at the base of the left mandibular lateral incisor. Consider a root abscess if there are consistent clinical findings. 3. Multiple chronically missing teeth as described. 4. Old fractures treated with prior ORIF. Electronically Signed   By: Amie Portland M.D.   On: 06/15/2018 15:18   Dg Chest 2 View  Result Date: 06/08/2018 CLINICAL DATA:  Upper abdominal pain and tightness, poor appetite, chest pain with shortness of breath EXAM: CHEST - 2 VIEW COMPARISON:  Chest x-ray of 06/02/2018 and 11/19/2016 FINDINGS: There is now increased  opacity at the left lung and mid left lung as well as the medial left apex suspicious for patchy pneumonia. There may be minimal involvement of the right lung base as well and follow-up is recommended. There is a small left pleural effusion present. Some fluid tracks into the major fissure on the lateral view. Mediastinal and hilar contours are unremarkable and the heart is minimally enlarged. No bony abnormality is seen. IMPRESSION: 1. Interval development of patchy opacities particularly at the left lung base, left mid lung and left lung apex with perhaps mild involvement of the right lung base, suspicious for multifocal pneumonia. Recommend follow-up chest x-ray. 2. Small left pleural effusion. Electronically Signed   By: Dwyane Dee M.D.   On: 06/08/2018 08:37   Dg Chest 2 View  Result Date: 06/02/2018 CLINICAL DATA:  37 year old male with cough and fever. EXAM: CHEST - 2 VIEW COMPARISON:  Chest radiograph dated 11/19/2017 FINDINGS: The heart size and mediastinal contours are within normal limits. Both lungs are clear. The visualized skeletal structures are unremarkable. IMPRESSION: No active cardiopulmonary disease. Electronically Signed   By: Elgie Collard M.D.   On: 06/02/2018 06:04   Ct Chest W Contrast  Result Date: 06/17/2018 CLINICAL DATA:  Right chest pain  and shortness of breath. IV drug abuse. Septic emboli. EXAM: CT CHEST WITH CONTRAST TECHNIQUE: Multidetector CT imaging of the chest was performed during intravenous contrast administration. CONTRAST:  75mL OMNIPAQUE IOHEXOL 300 MG/ML  SOLN COMPARISON:  Thoracic spine CT from 06/15/2018 FINDINGS: Cardiovascular: Mild cardiomegaly. Mediastinum/Nodes: Supraclavicular adenopathy including a 1.6 cm right supraclavicular node on image 19/3. Indistinct subcarinal adenopathy approximately 1.4 cm in diameter. Indistinct hilar adenopathy with a right hilar node at about 1.5 cm in short axis on image 71/3. Lungs/Pleura: Numerous cavitary nodules are  present throughout both lungs. Many of these are confluent with other cavitary nodules. Some of the nodules are not overtly cavitary. Index confluence of cavitary nodules at the right lung apex measures 4.1 by 3.5 cm. Moderate-sized bilateral pleural effusions are present. There is likely some mild pleural enhancement along with some pleural loculation more notably on the left, exudative effusions or empyema not excluded. Upper Abdomen: Unremarkable Musculoskeletal: On recent right shoulder MRI, there was an abnormal fluid collection tracking along the right subscapularis muscle laterally. This is partially seen on image 39/3, and may measure up to about 8.8 by 3.0 by 4.3 cm. This may well be an abscess within or along the lateral right subscapularis muscle. IMPRESSION: 1. Numerous cavitary nodules in both lungs favoring septic emboli in this setting. Bilateral pleural effusions are likely exudative and empyema is not excluded. 2. Abnormal low-density process involving the lateral portion of the right subscapularis muscle suspicious for abscess. 3. Reactive adenopathy including a 1.6 cm right supraclavicular lymph node. 4. Mild cardiomegaly. Electronically Signed   By: Gaylyn Rong M.D.   On: 06/17/2018 01:42   Mr Thoracic Spine Wo Contrast  Result Date: 06/15/2018 CLINICAL DATA:  Back pain with concern for epidural abscess. IV drug use. EXAM: MRI THORACIC AND LUMBAR SPINE WITHOUT CONTRAST TECHNIQUE: Multiplanar and multiecho pulse sequences of the thoracic and lumbar spine were obtained without intravenous contrast. COMPARISON:  CT abdomen pelvis 06/08/2018 FINDINGS: The study is degraded by motion, despite efforts to reduce this artifact. The findings of the study are interpreted in the context of reduced sensitivity/specificity. MRI THORACIC SPINE FINDINGS Alignment:  Physiologic. Vertebrae: No fracture, evidence of discitis, or bone lesion. Cord:  Normal signal and morphology. Paraspinal and other soft  tissues: Multiple areas of consolidation within both lungs. Disc levels: No disc herniation or spinal canal stenosis. MRI LUMBAR SPINE FINDINGS Segmentation:  Standard. Alignment:  Physiologic. Vertebrae:  No fracture, evidence of discitis, or bone lesion. Conus medullaris and cauda equina: Conus extends to the L1 level. Conus and cauda equina appear normal. Paraspinal and other soft tissues: Negative Disc levels: The L1-2 and L2-3 disc levels are normal. L3-L4: Mild facet hypertrophy without stenosis. L4-L5: Diffuse disc bulge, left eccentric, with mild bilateral facet hypertrophy. There is narrowing of the left lateral recess and moderate left neural foraminal stenosis. L5-S1: No disc herniation or stenosis. IMPRESSION: MR THORACIC SPINE IMPRESSION 1. No discitis-osteomyelitis or epidural abscess. 2. Multifocal cavitary consolidation within both lungs. MR LUMBAR SPINE IMPRESSION 1. No discitis-osteomyelitis or epidural abscess. 2. Lower lumbar degenerative disc disease, worst at L4-L5 where there is narrowing of the left lateral recess and moderate left neural foraminal stenosis. Electronically Signed   By: Deatra Declercq M.D.   On: 06/15/2018 21:51   Mr Lumbar Spine Wo Contrast  Result Date: 06/15/2018 CLINICAL DATA:  Back pain with concern for epidural abscess. IV drug use. EXAM: MRI THORACIC AND LUMBAR SPINE WITHOUT CONTRAST TECHNIQUE: Multiplanar and multiecho pulse sequences  of the thoracic and lumbar spine were obtained without intravenous contrast. COMPARISON:  CT abdomen pelvis 06/08/2018 FINDINGS: The study is degraded by motion, despite efforts to reduce this artifact. The findings of the study are interpreted in the context of reduced sensitivity/specificity. MRI THORACIC SPINE FINDINGS Alignment:  Physiologic. Vertebrae: No fracture, evidence of discitis, or bone lesion. Cord:  Normal signal and morphology. Paraspinal and other soft tissues: Multiple areas of consolidation within both lungs. Disc  levels: No disc herniation or spinal canal stenosis. MRI LUMBAR SPINE FINDINGS Segmentation:  Standard. Alignment:  Physiologic. Vertebrae:  No fracture, evidence of discitis, or bone lesion. Conus medullaris and cauda equina: Conus extends to the L1 level. Conus and cauda equina appear normal. Paraspinal and other soft tissues: Negative Disc levels: The L1-2 and L2-3 disc levels are normal. L3-L4: Mild facet hypertrophy without stenosis. L4-L5: Diffuse disc bulge, left eccentric, with mild bilateral facet hypertrophy. There is narrowing of the left lateral recess and moderate left neural foraminal stenosis. L5-S1: No disc herniation or stenosis. IMPRESSION: MR THORACIC SPINE IMPRESSION 1. No discitis-osteomyelitis or epidural abscess. 2. Multifocal cavitary consolidation within both lungs. MR LUMBAR SPINE IMPRESSION 1. No discitis-osteomyelitis or epidural abscess. 2. Lower lumbar degenerative disc disease, worst at L4-L5 where there is narrowing of the left lateral recess and moderate left neural foraminal stenosis. Electronically Signed   By: Deatra Shelburne M.D.   On: 06/15/2018 21:51   Ct Abdomen Pelvis W Contrast  Result Date: 06/19/2018 CLINICAL DATA:  Right upper quadrant abdominal pain and distention. Septic pulmonary emboli from endocarditis. Chronic hepatitis-C and IV drug abuse. EXAM: CT ABDOMEN AND PELVIS WITH CONTRAST TECHNIQUE: Multidetector CT imaging of the abdomen and pelvis was performed using the standard protocol following bolus administration of intravenous contrast. CONTRAST:  OMNIPAQUE IOHEXOL 300 MG/ML  SOLN COMPARISON:  06/08/2018 FINDINGS: Lower Chest: Small bilateral pleural effusions and cavitary pulmonary nodules in both lung bases again seen. Hepatobiliary: No hepatic masses identified. 1 cm cyst again seen within the anterior right hepatic lobe. Gallbladder is unremarkable. Pancreas:  No mass or inflammatory changes. Spleen: Within normal limits in size and appearance.  Adrenals/Urinary Tract: No masses identified. No evidence of hydronephrosis. Unremarkable unopacified urinary bladder. Stomach/Bowel: No evidence of obstruction, inflammatory process or abnormal fluid collections. Normal appendix visualized. Vascular/Lymphatic: No pathologically enlarged lymph nodes. No abdominal aortic aneurysm. Reproductive:  No mass or other significant abnormality. Other: New mild ascites and diffuse mesenteric and body wall edema since prior exam. Musculoskeletal:  No suspicious bone lesions identified. IMPRESSION: New mild ascites and diffuse mesenteric and body wall edema, consistent with 3rd spacing. No focal inflammatory process or abscess within the abdomen or pelvis. No significant change in small bilateral pleural effusions, and cavitary lung nodules consistent with known septic emboli. Electronically Signed   By: Myles Rosenthal M.D.   On: 06/19/2018 19:45   Ct Abdomen Pelvis W Contrast  Result Date: 06/08/2018 CLINICAL DATA:  Epigastric pain. Right lower quadrant pain and tenderness. Abdominal pain, appendicitis suspected. Acute pain, generalized. EXAM: CT ABDOMEN AND PELVIS WITH CONTRAST TECHNIQUE: Multidetector CT imaging of the abdomen and pelvis was performed using the standard protocol following bolus administration of intravenous contrast. CONTRAST:  ISOVUE-300 IOPAMIDOL (ISOVUE-300) INJECTION 61% COMPARISON:  One-view abdomen 12/13/2013 FINDINGS: Lower chest: Multiple poorly marginated cavitary pulmonary nodules are present in the lower lobes bilaterally. Left greater than right pleural effusion is present with associated atelectasis. There is no pneumothorax or greater consolidation. The heart size is normal. Hepatobiliary:  A 9 mm simple cyst is present in the anterior right lobe of the liver. No other focal lesions are present. There is no ductal dilation. The gallbladder is normal. Pancreas: Unremarkable. No pancreatic ductal dilatation or surrounding inflammatory  changes. Spleen: Mild splenomegaly is noted. No discrete lesions are evident. Adrenals/Urinary Tract: Adrenal glands are normal bilaterally. Kidneys and ureters are within normal limits. Gallbladder is normal. Stomach/Bowel: Stomach and duodenum are within normal limits. Small bowel is unremarkable. Terminal ileum is visualized and within normal limits. The appendix is visualized and normal. Ascending transverse colon are normal. Descending and sigmoid colon are within normal limits. Vascular/Lymphatic: No significant vascular findings are present. No enlarged abdominal or pelvic lymph nodes. Reproductive: Prostate is unremarkable. Other: No abdominal wall hernia or abnormality. No abdominopelvic ascites. Musculoskeletal: Bilateral L2 pars defects are present. Alignment is anatomic. Vertebral body heights are normal. No focal lytic or blastic lesions are present. Pelvis is intact. Hips are located and within normal limits bilaterally. IMPRESSION: 1. Multiple poorly marginated cavitary pulmonary nodules in the lower lobes bilaterally. Given the history of polysubstance abuse, this most likely reflects septic emboli. Other atypical infection or malignancy is considered much less likely. 2. No acute or focal abnormality in the abdomen to explain abdominal pain. 3. Mild splenomegaly without focal lesion. Electronically Signed   By: Marin Roberts M.D.   On: 06/08/2018 10:11   Mr Shoulder Right Wo Contrast  Result Date: 06/15/2018 CLINICAL DATA:  Septic emboli, possible septic arthritis of the shoulder. IV drug abuse. EXAM: MRI OF THE RIGHT SHOULDER WITHOUT CONTRAST TECHNIQUE: Multiplanar, multisequence MR imaging of the shoulder was performed. No intravenous contrast was administered. COMPARISON:  Known FINDINGS: Despite efforts by the technologist and Oscar Burke, motion artifact is present on today's exam and could not be eliminated. This reduces exam sensitivity and specificity. Rotator cuff:  Unremarkable  Muscles: We partially image a large septated fluid collection anterior to the scapula partially surrounding and deep to the subscapularis muscle as on image 20/9. This is probably separate from the adjacent subcoracoid bursitis and distended subscapular recess. I am suspicious that this is an abscess within or along the subscapularis muscle. Biceps long head:  Unremarkable Acromioclavicular Joint: No arthropathy. Type II acromion. There is only trace fluid in the subacromial subdeltoid bursa. Glenohumeral Joint: Moderate glenohumeral joint effusion. This mildly distends the axillary pouch. Labrum:  Indeterminate due to motion artifact. Bones:  No osseous edema to suggest osteomyelitis. Other: Low-level edema signal between the supraspinatus and trapezius muscles on image 11/6. IMPRESSION: 1. Large likely septated fluid collection along the margins of the subscapularis muscle, primarily between the subscapularis and the scapula, favoring abscess. This is only partially included on today's exam. 2. There is an adjacent joint effusion. Septic joint is likely although this could conceivably be reactive. An anterior needle approach to the joint with probably extend through the abscess for through some of the surrounding phlegmon. 3. Low-level infiltrative edema between the supraspinatus and trapezius muscles. Electronically Signed   By: Gaylyn Rong M.D.   On: 06/15/2018 23:07   Mr Foot Right Wo Contrast  Result Date: 06/17/2018 CLINICAL DATA:  Draining wound. Evaluate for osteomyelitis. Wound is along the plantar aspect of the foot. EXAM: MRI OF THE RIGHT FOREFOOT WITHOUT CONTRAST TECHNIQUE: Multiplanar, multisequence MR imaging of the right foot was performed. No intravenous contrast was administered. COMPARISON:  None. FINDINGS: Bones/Joint/Cartilage No marrow signal abnormality. No fracture or dislocation. Normal alignment. No joint effusion. No periosteal reaction or bone  destruction. No aggressive  osseous lesion. Ligaments Collateral ligaments are intact.  Lisfranc ligament is intact. Muscles and Tendons Flexor, peroneal and extensor compartment tendons are intact. Muscles are normal. Soft tissue Soft tissue wound along the plantar aspect of the midfoot. No adjacent fluid collection or hematoma. Mild soft tissue edema along the forefoot which may be secondary to cellulitis versus reactive edema secondary to venous stasis or fluid overload. No soft tissue mass. IMPRESSION: 1. No osteomyelitis of the right forefoot. 2. Soft tissue wound along the plantar aspect of the midfoot. No fluid collection to suggest an abscess or hematoma. 3. Mild soft tissue edema along the forefoot which may be secondary to cellulitis versus reactive edema secondary to venous stasis or fluid overload. Electronically Signed   By: Elige Ko   On: 06/17/2018 08:21   Dg Chest Port 1 View  Result Date: 06/20/2018 CLINICAL DATA:  Hypoxia.  Shortness of breath for the past week. EXAM: PORTABLE CHEST 1 VIEW COMPARISON:  06/19/2018; 06/08/2018; chest CT-06/16/2018 FINDINGS: Grossly unchanged enlarged cardiac silhouette and mediastinal contours with thickening the right paratracheal stripe. Suspected slightly increased conspicuity of bilateral lower lobe predominant nodular airspace opacities. Interval increase in size of small right-sided effusion with associated worsening right basilar heterogeneous/consolidative opacities. No pneumothorax. No evidence of edema. No acute osseus abnormalities. IMPRESSION: 1. Suspected slight worsening of extensive bilateral nodular airspace opacities worrisome for septic emboli. 2. Interval increase in size of small right-sided effusion. Electronically Signed   By: Simonne Come M.D.   On: 06/20/2018 08:09   Dg Chest Port 1 View  Result Date: 06/19/2018 Delaine Lame, MD     06/19/2018  4:27 PM INDICATION: Pleural effusion, rule out empyema PROCEDURE OPERATOR: Jatara Huettner ATTENDING PHYSICIAN:  Kobe Jansma  CONSENT: Consent was obtained from Oscar Burke prior to the procedure. Indications, risks, and benefits were explained at length.  PROCEDURE SUMMARY: A time out was performed and the chest x-ray was reviewed, the appropriate side was confirmed with ultrasound guidance and marked. My hands were washed immediately prior to the procedure. I wore a surgical cap, mask and sterile gloves throughout the procedure. The Oscar Burke was prepped and draped in a sterile manner using chlorhexidine scrub after the appropriate level was percussed and confirmed by ultrasound. 1% lidocaine was used to anesthesize the skin, subcutaneous tissue, superior aspect of the rib periosteum and parietal pleura. A finder needle was then introduced over the superior aspect of the rib to locate the pleural fluid; serosanguineous cloudy colored fluid was aspirated. A 10-blade scalpel was used to nick the skin at the insertion site. The Safe-t-Centesis needle was then introduced through the skin incision into the pleural space using negative aspiration pressure and the red colometric indicator to confirm appropriate positioning of the needle. The thoracentesis catheter was then threaded without difficulty.  550 ml of cloudy serosanguineous colored fluid was removed without difficulty. The catheter was then removed. No immediate complications were noted during the procedure. A post-procedure chest x-ray is pending at the time of this note. The fluid will be sent for studies.  Estimated blood loss is minimal.  Dg Abd 2 Views  Result Date: 06/19/2018 CLINICAL DATA:  Abdominal pain EXAM: ABDOMEN - 2 VIEW COMPARISON:  Chest CT dated 06/16/2018. FINDINGS: Moderate amount of gas and stool throughout the colon, with associated mild distention of the LEFT colon. No evidence of small bowel obstruction. No evidence of soft tissue mass or abnormal fluid collection. No evidence of free intraperitoneal air. No evidence  of renal or ureteral calculi.  No acute or suspicious osseous finding. RIGHT pleural effusion, as also seen on the recent chest CT. Streaky opacities at the LEFT lung base correspond to the presumed septic emboli and associated consolidations described on recent chest CT. IMPRESSION: Overall nonobstructive bowel gas pattern. Moderate amount of gas and stool throughout the colon, with associated mild distention of the LEFT colon. Electronically Signed   By: Bary Richard M.D.   On: 06/19/2018 13:01   Dg Finger Index Left  Result Date: 06/08/2018 CLINICAL DATA:  Pain MCP joint of LEFT index finger. No known injury. EXAM: LEFT INDEX FINGER 2+V COMPARISON:  None. FINDINGS: There is no evidence of fracture or dislocation. There is no evidence of arthropathy or other focal bone abnormality. Soft tissues are unremarkable. IMPRESSION: Negative. Electronically Signed   By: Bary Richard M.D.   On: 06/08/2018 14:45   Dg Fluoro Guided Needle Plc Aspiration/injection Loc  Result Date: 06/16/2018 CLINICAL DATA:  Right shoulder effusion with clinical suspicion of septic joint. EXAM: RIGHT SHOULDER ASPIRATION UNDER FLUOROSCOPY TECHNIQUE: An appropriate skin entrance site was determined. The site was marked, prepped with Betadine, draped in the usual sterile fashion, and infiltrated locally with buffered Lidocaine. 22 gauge spinal needle was advanced to the medial margin of the humeral head under intermittent fluoroscopy. 1 ml of Lidocaine injected easily. A mixture of 2 cc Isovue and saline was then used to opacify the right shoulder capsule. Aspiration was then performed yielding approximately 1 cc of serosanguineous fluid, which was sent to microbiology. No immediate complication. FLUOROSCOPY TIME:  Fluoroscopy Time:  18 seconds. Number of Acquired Spot Images: 1 FINDINGS: Normal alignment of the glenohumeral joint. No osseous erosive lesions noted. A mixture of Isovue/saline was easily injected into the right glenohumeral joint. Scant amount of  serosanguineous fluid was aspirated and sent to microbiology. IMPRESSION: Technically successful right shoulder diagnostic aspiration. Electronically Signed   By: Ted Mcalpine M.D.   On: 06/16/2018 11:38    06/08/2018 echo:- Left ventricle: The cavity size was normal. Wall thickness was normal. Systolic function was normal. The estimated ejection fraction was in the range of 55% to 60%. Wall motion was normal; there were no regional wall motion abnormalities. Left ventricular diastolic function parameters were normal. - Ventricular septum: Very mildly D-shaped interventricular septum is concerning for RV pressure/volume overload. - Aortic valve: There was no stenosis. - Mitral valve: There was no regurgitation. - Right ventricle: The cavity size was mildly dilated. Systolic function was normal. - Right atrium: The atrium was mildly dilated. - Tricuspid valve: There was mild-moderate regurgitation. Suspected tricuspid vegetation. Peak RV-RA gradient (S): 25 mm Hg. - Pulmonary arteries: PA peak pressure: 33 mm Hg (S). - Systemic veins: IVC measured 2.3 cm with > 50% respirophasic variation, suggesting RA pressure 8 mmHg. - Pericardium, extracardiac: Small circumferential pericardial effusion.  Impressions:  - Normal LV size and systolic function, EF 55-60%. Normal diastolic function. Mildly D-shaped interventricular septum suggesting a degree of RV pressure/volume overload. Mildly dilated RV with normal systolic function. Mild to moderate TR. There does appear  to be a tricuspid valve vegetation. Need TEE to confirm.   06/13/2018 TEE:- Left ventricle: Systolic function was normal. The estimated ejection fraction was in the range of 60% to 65%. - Aortic valve: No evidence of vegetation. - Mitral valve: No evidence of vegetation. - Left atrium: No evidence of thrombus in the atrial cavity or appendage. - Right atrium: No evidence of thrombus in  the  atrial cavity or appendage. - Atrial septum: No defect or patent foramen ovale was identified. - Tricuspid valve: Large vegetation on the lateral leaflet of the TV measuring 2 cm in longest dimension. There was moderate-severe regurgitation. - Pulmonic valve: No evidence of vegetation.  06/16/2018 arthrocentesis of right shoulder with interventional radiology  06/19/2018 bedside thoracentesis   Subjective:   Discharge Exam: Vitals:   06/21/18 0455 06/21/18 1446  BP: 109/80 111/75  Pulse: 85 (!) 103  Resp:  18  Temp: 98.9 F (37.2 C) 99.1 F (37.3 C)  SpO2: 97% 96%   Vitals:   06/20/18 2202 06/21/18 0041 06/21/18 0455 06/21/18 1446  BP: 111/82  109/80 111/75  Pulse: (!) 103  85 (!) 103  Resp: (!) 22   18  Temp: (!) 100.7 F (38.2 C) 99 F (37.2 C) 98.9 F (37.2 C) 99.1 F (37.3 C)  TempSrc: Oral Oral Oral Oral  SpO2: 93%  97% 96%  Weight:   80.4 kg   Height:        Unable to perform physical exam as Oscar Burke left AGAINST MEDICAL ADVICE    The results of significant diagnostics from this hospitalization (including imaging, microbiology, ancillary and laboratory) are listed below for reference.     Microbiology: Recent Results (from the past 240 hour(s))  Body fluid culture (includes gram stain)     Status: None   Collection Time: 06/19/18  4:07 PM  Result Value Ref Range Status   Specimen Description PLEURAL  Final   Special Requests NONE  Final   Gram Stain   Final    FEW WBC PRESENT,BOTH PMN AND MONONUCLEAR NO ORGANISMS SEEN    Culture   Final    NO GROWTH 3 DAYS Performed at [facility], [address], [place], [place] [address]    Report Status 06/22/2018 FINAL  Final     Labs: BNP (last 3 results) No results for input(s): BNP in the last 8760 hours. Basic Metabolic Panel: Recent Labs  Lab 06/20/18 0357 06/21/18 0721  NA 130* 132*  K 4.5 4.9  CL 94* 100  CO2 29 29  GLUCOSE 112* 87  BUN 22* 21*  CREATININE 1.42*  1.09  CALCIUM 8.0* 7.9*  MG 2.0 1.8   Liver Function Tests: Recent Labs  Lab 06/20/18 0357 06/21/18 0721  AST 100* 60*  ALT 33 23  ALKPHOS 135* 119  BILITOT 0.5 0.5  PROT 6.6 5.3*  ALBUMIN 1.5* 1.2*   No results for input(s): LIPASE, AMYLASE in the last 168 hours. No results for input(s): AMMONIA in the last 168 hours. CBC: Recent Labs  Lab 06/20/18 0357 06/21/18 0721  WBC 15.7* 12.6*  HGB 8.2* 7.3*  HCT 27.5* 24.1*  MCV 90.5 89.6  PLT 473* 416*   Cardiac Enzymes: No results for input(s): CKTOTAL, CKMB, CKMBINDEX, TROPONINI in the last 168 hours. BNP: Invalid input(s): POCBNP CBG: No results for input(s): GLUCAP in the last 168 hours. D-Dimer No results for input(s): DDIMER in the last 72 hours. Hgb A1c No results for input(s): HGBA1C in the last 72 hours. Lipid Profile No results for input(s): CHOL, HDL, LDLCALC, TRIG, CHOLHDL, LDLDIRECT in the last 72 hours. Thyroid function studies No results for input(s): TSH, T4TOTAL, T3FREE, THYROIDAB in the last 72 hours.  Invalid input(s): FREET3 Anemia work up No results for input(s): VITAMINB12, FOLATE, FERRITIN, TIBC, IRON, RETICCTPCT in the last 72 hours. Urinalysis    Component Value Date/Time   COLORURINE AMBER (A) 06/08/2018 6045  APPEARANCEUR HAZY (A) 06/08/2018 0811   LABSPEC 1.017 06/08/2018 0811   PHURINE 5.0 06/08/2018 0811   GLUCOSEU NEGATIVE 06/08/2018 0811   HGBUR MODERATE (A) 06/08/2018 0811   BILIRUBINUR NEGATIVE 06/08/2018 0811   KETONESUR NEGATIVE 06/08/2018 0811   PROTEINUR 30 (A) 06/08/2018 0811   UROBILINOGEN 2.0 (H) 12/17/2013 0848   NITRITE NEGATIVE 06/08/2018 0811   LEUKOCYTESUR NEGATIVE 06/08/2018 0811   Sepsis Labs Invalid input(s): PROCALCITONIN,  WBC,  LACTICIDVEN Microbiology Recent Results (from the past 240 hour(s))  Body fluid culture (includes gram stain)     Status: None   Collection Time: 06/19/18  4:07 PM  Result Value Ref Range Status   Specimen Description PLEURAL   Final   Special Requests NONE  Final   Gram Stain   Final    FEW WBC PRESENT,BOTH PMN AND MONONUCLEAR NO ORGANISMS SEEN    Culture   Final    NO GROWTH 3 DAYS Performed at [facility], [address] [place], [place] [address]    Report Status 06/22/2018 FINAL  Final     Time coordinating discharge: 35  SIGNED:   Delaine Lame, MD  [facility] 06/26/2018, 3:07 PM  If 7PM-7AM, please contact night-coverage [email or web address] Password [email or web address]

## 2018-06-28 ENCOUNTER — Inpatient Hospital Stay (HOSPITAL_COMMUNITY)
Admission: EM | Admit: 2018-06-28 | Discharge: 2018-07-01 | DRG: 288 | Discharge status: Against Medical Advice | Payer: Self-pay | Attending: Internal Medicine | Admitting: Internal Medicine

## 2018-06-28 DIAGNOSIS — I38 Endocarditis, valve unspecified: Secondary | ICD-10-CM | POA: Diagnosis present

## 2018-06-28 DIAGNOSIS — F1910 Other psychoactive substance abuse, uncomplicated: Secondary | ICD-10-CM | POA: Diagnosis present

## 2018-06-28 DIAGNOSIS — B9561 Methicillin susceptible Staphylococcus aureus infection as the cause of diseases classified elsewhere: Secondary | ICD-10-CM | POA: Diagnosis present

## 2018-06-28 DIAGNOSIS — R402252 Coma scale, best verbal response, oriented, at arrival to emergency department: Secondary | ICD-10-CM | POA: Diagnosis present

## 2018-06-28 DIAGNOSIS — Z6823 Body mass index (BMI) 23.0-23.9, adult: Secondary | ICD-10-CM

## 2018-06-28 DIAGNOSIS — I330 Acute and subacute infective endocarditis: Principal | ICD-10-CM | POA: Diagnosis present

## 2018-06-28 DIAGNOSIS — F17210 Nicotine dependence, cigarettes, uncomplicated: Secondary | ICD-10-CM | POA: Diagnosis present

## 2018-06-28 DIAGNOSIS — Z791 Long term (current) use of non-steroidal anti-inflammatories (NSAID): Secondary | ICD-10-CM

## 2018-06-28 DIAGNOSIS — M25511 Pain in right shoulder: Secondary | ICD-10-CM | POA: Diagnosis present

## 2018-06-28 DIAGNOSIS — B182 Chronic viral hepatitis C: Secondary | ICD-10-CM | POA: Diagnosis present

## 2018-06-28 DIAGNOSIS — R402362 Coma scale, best motor response, obeys commands, at arrival to emergency department: Secondary | ICD-10-CM | POA: Diagnosis present

## 2018-06-28 DIAGNOSIS — R402142 Coma scale, eyes open, spontaneous, at arrival to emergency department: Secondary | ICD-10-CM | POA: Diagnosis present

## 2018-06-28 DIAGNOSIS — I2690 Septic pulmonary embolism without acute cor pulmonale: Secondary | ICD-10-CM | POA: Diagnosis present

## 2018-06-28 DIAGNOSIS — E46 Unspecified protein-calorie malnutrition: Secondary | ICD-10-CM | POA: Diagnosis present

## 2018-06-28 DIAGNOSIS — I76 Septic arterial embolism: Secondary | ICD-10-CM | POA: Diagnosis present

## 2018-06-28 DIAGNOSIS — F319 Bipolar disorder, unspecified: Secondary | ICD-10-CM | POA: Diagnosis present

## 2018-06-28 DIAGNOSIS — D638 Anemia in other chronic diseases classified elsewhere: Secondary | ICD-10-CM | POA: Diagnosis present

## 2018-06-28 DIAGNOSIS — Z79899 Other long term (current) drug therapy: Secondary | ICD-10-CM

## 2018-06-28 DIAGNOSIS — Z5329 Procedure and treatment not carried out because of patient's decision for other reasons: Secondary | ICD-10-CM | POA: Diagnosis present

## 2018-06-28 DIAGNOSIS — F419 Anxiety disorder, unspecified: Secondary | ICD-10-CM | POA: Diagnosis present

## 2018-06-28 NOTE — ED Triage Notes (Signed)
Pt was admitted to [facility] last week for endocarditis but left AMA, he states he has been taking the antibiotics but he's feeling worse and feels like theres a weight on his chest

## 2018-06-29 ENCOUNTER — Other Ambulatory Visit: Payer: Self-pay

## 2018-06-29 ENCOUNTER — Encounter (HOSPITAL_COMMUNITY): Payer: Self-pay

## 2018-06-29 ENCOUNTER — Emergency Department (HOSPITAL_COMMUNITY): Payer: Self-pay

## 2018-06-29 DIAGNOSIS — I330 Acute and subacute infective endocarditis: Principal | ICD-10-CM

## 2018-06-29 DIAGNOSIS — I079 Rheumatic tricuspid valve disease, unspecified: Secondary | ICD-10-CM

## 2018-06-29 DIAGNOSIS — R7881 Bacteremia: Secondary | ICD-10-CM

## 2018-06-29 DIAGNOSIS — F17210 Nicotine dependence, cigarettes, uncomplicated: Secondary | ICD-10-CM

## 2018-06-29 DIAGNOSIS — I2690 Septic pulmonary embolism without acute cor pulmonale: Secondary | ICD-10-CM

## 2018-06-29 DIAGNOSIS — Z8619 Personal history of other infectious and parasitic diseases: Secondary | ICD-10-CM

## 2018-06-29 DIAGNOSIS — F1910 Other psychoactive substance abuse, uncomplicated: Secondary | ICD-10-CM

## 2018-06-29 DIAGNOSIS — I38 Endocarditis, valve unspecified: Secondary | ICD-10-CM | POA: Diagnosis present

## 2018-06-29 DIAGNOSIS — B9561 Methicillin susceptible Staphylococcus aureus infection as the cause of diseases classified elsewhere: Secondary | ICD-10-CM

## 2018-06-29 DIAGNOSIS — I071 Rheumatic tricuspid insufficiency: Secondary | ICD-10-CM

## 2018-06-29 DIAGNOSIS — I76 Septic arterial embolism: Secondary | ICD-10-CM

## 2018-06-29 LAB — CBC WITH DIFFERENTIAL/PLATELET
ABS IMMATURE GRANULOCYTES: 0.26 10*3/uL — AB (ref 0.00–0.07)
Basophils Absolute: 0.1 10*3/uL (ref 0.0–0.1)
Basophils Relative: 1 %
Eosinophils Absolute: 0.7 10*3/uL — ABNORMAL HIGH (ref 0.0–0.5)
Eosinophils Relative: 6 %
HCT: 23.7 % — ABNORMAL LOW (ref 39.0–52.0)
HEMOGLOBIN: 7 g/dL — AB (ref 13.0–17.0)
IMMATURE GRANULOCYTES: 2 %
LYMPHS PCT: 15 %
Lymphs Abs: 1.8 10*3/uL (ref 0.7–4.0)
MCH: 27.6 pg (ref 26.0–34.0)
MCHC: 29.5 g/dL — ABNORMAL LOW (ref 30.0–36.0)
MCV: 93.3 fL (ref 80.0–100.0)
MONO ABS: 0.7 10*3/uL (ref 0.1–1.0)
MONOS PCT: 6 %
NEUTROS ABS: 8.1 10*3/uL — AB (ref 1.7–7.7)
NEUTROS PCT: 70 %
Platelets: 384 10*3/uL (ref 150–400)
RBC: 2.54 MIL/uL — ABNORMAL LOW (ref 4.22–5.81)
RDW: 13.8 % (ref 11.5–15.5)
WBC: 11.7 10*3/uL — ABNORMAL HIGH (ref 4.0–10.5)
nRBC: 0 % (ref 0.0–0.2)

## 2018-06-29 LAB — SEDIMENTATION RATE: Sed Rate: 90 mm/hr — ABNORMAL HIGH (ref 0–16)

## 2018-06-29 LAB — BRAIN NATRIURETIC PEPTIDE: B Natriuretic Peptide: 477 pg/mL — ABNORMAL HIGH (ref 0.0–100.0)

## 2018-06-29 LAB — COMPREHENSIVE METABOLIC PANEL
ALBUMIN: 1.8 g/dL — AB (ref 3.5–5.0)
ALK PHOS: 99 U/L (ref 38–126)
ALT: 36 U/L (ref 0–44)
AST: 48 U/L — AB (ref 15–41)
Anion gap: 6 (ref 5–15)
BUN: 16 mg/dL (ref 6–20)
CO2: 28 mmol/L (ref 22–32)
CREATININE: 1.39 mg/dL — AB (ref 0.61–1.24)
Calcium: 7.8 mg/dL — ABNORMAL LOW (ref 8.9–10.3)
Chloride: 102 mmol/L (ref 98–111)
GFR calc non Af Amer: >60 mL/min (ref >60)
GLUCOSE: 106 mg/dL — AB (ref 70–99)
Potassium: 3.6 mmol/L (ref 3.5–5.1)
Sodium: 136 mmol/L (ref 135–145)
Total Bilirubin: 0.5 mg/dL (ref 0.3–1.2)
Total Protein: 6.5 g/dL (ref 6.5–8.1)

## 2018-06-29 LAB — I-STAT TROPONIN, ED: Troponin i, poc: 0.01 ng/mL (ref 0.00–0.08)

## 2018-06-29 LAB — C-REACTIVE PROTEIN: CRP: 2.7 mg/dL — ABNORMAL HIGH (ref <1.0)

## 2018-06-29 MED ORDER — IOPAMIDOL (ISOVUE-370) INJECTION 76%
INTRAVENOUS | Status: AC
  Filled 2018-06-29: qty 100

## 2018-06-29 MED ORDER — ENOXAPARIN SODIUM 40 MG/0.4ML ~~LOC~~ SOLN
40.0000 mg | SUBCUTANEOUS | Status: DC
  Filled 2018-06-29 (×2): qty 0.4

## 2018-06-29 MED ORDER — IOPAMIDOL (ISOVUE-370) INJECTION 76%
100.0000 mL | Freq: Once | INTRAVENOUS | Status: AC | PRN
  Administered 2018-06-29: 100 mL via INTRAVENOUS

## 2018-06-29 MED ORDER — ACETAMINOPHEN 325 MG PO TABS
650.0000 mg | ORAL_TABLET | Freq: Four times a day (QID) | ORAL | Status: DC | PRN
  Administered 2018-06-29: 650 mg via ORAL
  Filled 2018-06-29 (×2): qty 2

## 2018-06-29 MED ORDER — KETOROLAC TROMETHAMINE 15 MG/ML IJ SOLN: 50.0000 mg | Freq: Four times a day (QID) | INTRAMUSCULAR | Status: DC | PRN

## 2018-06-29 MED ORDER — SODIUM CHLORIDE 0.9 % IJ SOLN
INTRAMUSCULAR | Status: AC
  Filled 2018-06-29: qty 50

## 2018-06-29 MED ORDER — TRAMADOL HCL 50 MG PO TABS
50.0000 mg | ORAL_TABLET | Freq: Four times a day (QID) | ORAL | Status: DC | PRN
  Administered 2018-06-29 – 2018-07-01 (×4): 50 mg via ORAL
  Filled 2018-06-29 (×6): qty 1

## 2018-06-29 MED ORDER — ALPRAZOLAM 0.5 MG PO TABS
0.5000 mg | ORAL_TABLET | Freq: Two times a day (BID) | ORAL | Status: DC | PRN
  Administered 2018-06-29 – 2018-07-01 (×4): 0.5 mg via ORAL
  Filled 2018-06-29 (×4): qty 1

## 2018-06-29 MED ORDER — CEFAZOLIN SODIUM-DEXTROSE 2-4 GM/100ML-% IV SOLN
2.0000 g | Freq: Three times a day (TID) | INTRAVENOUS | Status: DC
  Administered 2018-06-29 – 2018-07-01 (×7): 2 g via INTRAVENOUS
  Filled 2018-06-29 (×9): qty 100

## 2018-06-29 NOTE — Consult Note (Signed)
[place] for Infectious Disease    Date of Admission:  06/28/2018   Total days of antibiotics: 0 ancef               Reason for Consult: Staph bacteremia, IVDA   Referring Provider: Rodena Piety   Assessment: MSSA bacteremia Tricuspid regurg TV IE Septic Pulm emboli IVDA (active)  Plan: 1. Agree with continue ancef 2. Recheck BCx 3. Would consider repeat TEE 4. Substitution therapy for his IVDA 5. Check HIV  Comment- Would query if his valve is further failing.   Thank you so much for this interesting consult,  Active Problems:   Septic embolism ([place])   Endocarditis   . enoxaparin (LOVENOX) injection  40 mg Subcutaneous Q24H    HPI: Oscar Burke is a 37 y.o. male with hx of IVDU, Hep C, adm 06-08-18 with TV IE with severe TR, and septic emboli to lungs. His BCx were positive for MSSA. His course was complicated by persistent fever, MRI showing R shoulder subscapularis abscess. He had thoracentesis that was felt to be consistent with parapneumonic effusion.  He left AMA on 10-15.  I called him on 10-16 to get him a keflex rx.  He returns today with f/c, worsening DOE and pleuritic CP. He had CT scans in ED showing improvement (but not resolution) of septic emboli. His R > L pleural effusion was also improved.    Review of Systems: Review of Systems  Unable to perform ROS: Other  Constitutional: Positive for fever. Negative for chills.  Respiratory: Positive for shortness of breath.   Cardiovascular: Positive for chest pain and leg swelling.  Gastrointestinal: Negative for constipation and diarrhea.  Genitourinary: Negative for dysuria.  Please see HPI. All other systems reviewed and negative.   Past Medical History:  Diagnosis Date  . Bipolar 1 disorder ([place])   . Chronic back pain   . Hepatitis C   . Polysubstance abuse ([place])   . Smoker     Social History   Tobacco Use  . Smoking status: Current Every Day Smoker    Packs/day: 1.00   Types: Cigarettes  . Smokeless tobacco: Never Used  Substance Use Topics  . Alcohol use: No    Frequency: Never  . Drug use: Yes    Types: Cocaine, IV    Comment: Heroin, ice, crack, and morphine pills    Family History  Problem Relation Age of Onset  . Cancer Mother      Medications:  Scheduled: . enoxaparin (LOVENOX) injection  40 mg Subcutaneous Q24H    Abtx:  Anti-infectives (From admission, onward)   Start     Dose/Rate Route Frequency Ordered Stop   06/29/18 1000  ceFAZolin (ANCEF) IVPB 2g/100 mL premix     2 g 200 mL/hr over 30 Minutes Intravenous Every 8 hours 06/29/18 0855          OBJECTIVE: Blood pressure 132/87, pulse 89, temperature 99 F (37.2 C), temperature source Oral, resp. rate 16, height 5' 10.98" (1.803 m), weight 75.7 kg, SpO2 97 %.  Physical Exam  Constitutional: He appears well-developed and well-nourished. No distress.  Cardiovascular: Normal rate, regular rhythm and normal heart sounds.  Pulmonary/Chest: Effort normal and breath sounds normal.  Abdominal: Soft. Bowel sounds are normal. There is no tenderness.  Musculoskeletal: He exhibits edema.       Arms:   Lab Results Results for orders placed or performed during the hospital encounter of 06/28/18 (from the  past 48 hour(s))  CBC with Differential/Platelet     Status: Abnormal   Collection Time: 06/29/18 12:51 AM  Result Value Ref Range   WBC 11.7 (H) 4.0 - 10.5 K/uL   RBC 2.54 (L) 4.22 - 5.81 MIL/uL   Hemoglobin 7.0 (L) 13.0 - 17.0 g/dL   HCT 23.7 (L) 39.0 - 52.0 %   MCV 93.3 80.0 - 100.0 fL   MCH 27.6 26.0 - 34.0 pg   MCHC 29.5 (L) 30.0 - 36.0 g/dL   RDW 13.8 11.5 - 15.5 %   Platelets 384 150 - 400 K/uL   nRBC 0.0 0.0 - 0.2 %   Neutrophils Relative % 70 %   Neutro Abs 8.1 (H) 1.7 - 7.7 K/uL   Lymphocytes Relative 15 %   Lymphs Abs 1.8 0.7 - 4.0 K/uL   Monocytes Relative 6 %   Monocytes Absolute 0.7 0.1 - 1.0 K/uL   Eosinophils Relative 6 %   Eosinophils Absolute 0.7 (H)  0.0 - 0.5 K/uL   Basophils Relative 1 %   Basophils Absolute 0.1 0.0 - 0.1 K/uL   Immature Granulocytes 2 %   Abs Immature Granulocytes 0.26 (H) 0.00 - 0.07 K/uL    Comment: Performed at [facility], [place] [address], [place], [place] [address]  Comprehensive metabolic panel     Status: Abnormal   Collection Time: 06/29/18 12:51 AM  Result Value Ref Range   Sodium 136 135 - 145 mmol/L   Potassium 3.6 3.5 - 5.1 mmol/L   Chloride 102 98 - 111 mmol/L   CO2 28 22 - 32 mmol/L   Glucose, Bld 106 (H) 70 - 99 mg/dL   BUN 16 6 - 20 mg/dL   Creatinine, Ser 1.39 (H) 0.61 - 1.24 mg/dL   Calcium 7.8 (L) 8.9 - 10.3 mg/dL   Total Protein 6.5 6.5 - 8.1 g/dL   Albumin 1.8 (L) 3.5 - 5.0 g/dL   AST 48 (H) 15 - 41 U/L   ALT 36 0 - 44 U/L   Alkaline Phosphatase 99 38 - 126 U/L   Total Bilirubin 0.5 0.3 - 1.2 mg/dL   GFR calc non Af Amer >60 >60 mL/min   GFR calc Af Amer >60 >60 mL/min    Comment: (NOTE) The eGFR has been calculated using the CKD EPI equation. This calculation has not been validated in all clinical situations. eGFR's persistently <60 mL/min signify possible Chronic Kidney Disease.    Anion gap 6 5 - 15    Comment: Performed at [facility], [place] [address], [place], [place] [address]  Brain natriuretic peptide     Status: Abnormal   Collection Time: 06/29/18 12:51 AM  Result Value Ref Range   B Natriuretic Peptide 477.0 (H) 0.0 - 100.0 pg/mL    Comment: Performed at [facility], [place] [address], [place], [place] [address]  Sedimentation rate     Status: Abnormal   Collection Time: 06/29/18 12:51 AM  Result Value Ref Range   Sed Rate 90 (H) 0 - 16 mm/hr    Comment: Performed at [facility], [place] [address], [place], [place] [address]  C-reactive protein     Status: Abnormal   Collection Time: 06/29/18 12:51 AM  Result Value Ref Range   CRP 2.7 (H) <1.0 mg/dL    Comment: Performed at [facility], [place] [address], [place], [place] [address]  Culture, blood (Routine X 2) w Reflex to ID  Panel     Status: None (Preliminary result)   Collection Time: 06/29/18 12:52 AM  Result Value Ref Range   Specimen Description      BLOOD RIGHT FOREARM Performed at [facility], [address], [place], [place] [address]    Special Requests      BOTTLES DRAWN AEROBIC AND ANAEROBIC Blood Culture adequate volume Performed at [place] [address], [place], [place] [address]    Culture PENDING    Report Status PENDING   I-stat troponin, ED     Status: None   Collection Time: 06/29/18  1:07 AM  Result Value Ref Range   Troponin i, poc 0.01 0.00 - 0.08 ng/mL   Comment 3            Comment: Due to the release kinetics of cTnI, a negative result within the first hours of the onset of symptoms does not rule out myocardial infarction with certainty. If myocardial infarction is still suspected, repeat the test at appropriate intervals.       Component Value Date/Time   SDES  06/29/2018 0052    BLOOD RIGHT FOREARM Performed at [facility], [place] [address], [place], [place] [address]    SPECREQUEST  06/29/2018 0052    BOTTLES DRAWN AEROBIC AND ANAEROBIC Blood Culture adequate volume Performed at [facility], [place] [address] [place], [place] [address]    CULT PENDING 06/29/2018 0052   REPTSTATUS PENDING 06/29/2018 0052   Dg Chest 2 View  Result Date: 06/29/2018 CLINICAL DATA:  Shortness of breath. Chest pain. Recent hospital admission for endocarditis, left AMA. EXAM: CHEST - 2 VIEW COMPARISON:  Radiograph 06/20/2018.  CT 06/16/2018 FINDINGS: Improving lung aeration with multifocal nodular opacities, many of these were necrotic on prior CT. Left upper lobe nodule is necrotic by radiograph. Decreased but residual small bilateral pleural effusions. Decreased cardiomegaly. Unchanged mediastinal contours. No pneumothorax.  IMPRESSION: Radiographic improvement compared to prior exam with decreased pleural effusions and bibasilar opacities. Multiple pulmonary nodules consistent with septic emboli again seen, many of which are cavitary on prior CT. Electronically Signed   By: Keith Rake M.D.   On: 06/29/2018 01:37   Ct Angio Chest Pe W Or Wo Contrast  Result Date: 06/29/2018 CLINICAL DATA:  Shortness of breath. Recent hospital admission for endocarditis and septic emboli, left AMA. Now feels worse. EXAM: CT ANGIOGRAPHY CHEST WITH CONTRAST TECHNIQUE: Multidetector CT imaging of the chest was performed using the standard protocol during bolus administration of intravenous contrast. Multiplanar CT image reconstructions and MIPs were obtained to evaluate the vascular anatomy. CONTRAST:  116m ISOVUE-370 IOPAMIDOL (ISOVUE-370) INJECTION 76% COMPARISON:  Radiographs earlier this day.  Chest CT 06/16/2018 FINDINGS: Cardiovascular: There are no filling defects within the pulmonary arteries to suggest pulmonary embolus. The thoracic aorta is normal in caliber without dissection. The heart is normal in size. No pericardial effusion. Mediastinum/Nodes: Persistent enlarged right supraclavicular node measuring 12 mm, previously 16 mm. Vague subcarinal adenopathy is again seen, not significantly changed. Indistinct bilateral hilar nodes are also not significantly changed. Esophagus is nondistended. Lungs/Pleura: Innumerable cavitary and non cavitary nodules throughout all lobes of both lungs consistent with septic emboli. Overall improvement in size from prior exam. Dominant right apical cavitary process measures 3.1 x 1.9 cm image 26 series 6, previously 3.5 x 4.1 cm. Bilateral pleural effusions, small to moderate. These have improved from prior exam. Mild pleural enhancement. No pulmonary edema. Trachea and mainstem bronchi are patent.  Upper Abdomen: No acute finding. Musculoskeletal: The previous fluid collection tracking along the right  subscapularis muscle is less well-defined, and likely improved, incompletely characterized on chest CT. There is whole body wall edema. No bony destructive change. Review of the MIP images confirms the above findings. IMPRESSION: 1. Persistent bilateral septic emboli, improved from 06/16/2018 CT. 2. Small to moderate bilateral pleural effusions, right greater than left, also improved. 3. Previous perimuscular fluid collection about the right shoulder is not as well seen on the current exam. 4. No acute pulmonary embolus. Electronically Signed   By: Keith Rake M.D.   On: 06/29/2018 04:30   Recent Results (from the past 240 hour(s))  Culture, blood (Routine X 2) w Reflex to ID Panel     Status: None (Preliminary result)   Collection Time: 06/29/18 12:52 AM  Result Value Ref Range Status   Specimen Description   Final    BLOOD RIGHT FOREARM Performed at [facility], [place] [address], [place], [place] [address]    Special Requests   Final    BOTTLES DRAWN AEROBIC AND ANAEROBIC Blood Culture adequate volume Performed at [place] [address] [place], [place] [address]    Culture PENDING  Incomplete   Report Status PENDING  Incomplete    Microbiology: Recent Results (from the past 240 hour(s))  Culture, blood (Routine X 2) w Reflex to ID Panel     Status: None (Preliminary result)   Collection Time: 06/29/18 12:52 AM  Result Value Ref Range Status   Specimen Description   Final    BLOOD RIGHT FOREARM Performed at [facility], [address], [place], [place] [address]    Special Requests   Final    BOTTLES DRAWN AEROBIC AND ANAEROBIC Blood Culture adequate volume Performed at [place] [address] [place], [place] [address]    Culture PENDING  Incomplete   Report Status PENDING  Incomplete    Radiographs and labs were personally reviewed by me.   Bobby Rumpf, MD [facility] [phone] 06/29/2018, 4:46 PM

## 2018-06-29 NOTE — ED Provider Notes (Signed)
[facility]-EMERGENCY DEPT Provider Note   CSN: [id number] Arrival date & time: 06/28/18  2253     History   Chief Complaint Chief Complaint  Patient presents with  . Shortness of Breath    HPI Oscar Burke is a 37 y.o. male.  Patient presents to the emergency department for evaluation of chest pain, back pain, shortness of breath.  Patient was recently hospitalized for endocarditis.  He stayed in the hospital for 2-1/2 weeks of the recommended 6 weeks, then left AGAINST MEDICAL ADVICE.  He has been taking oral Keflex since discharge, but has started to notice increased fatigue, chest discomfort, shortness of breath.      Past Medical History:  Diagnosis Date  . Bipolar 1 disorder (HCC)   . Chronic back pain   . Hepatitis C   . Polysubstance abuse (HCC)   . Smoker     Patient Active Problem List   Diagnosis Date Noted  . Endocarditis 06/29/2018  . MSSA bacteremia   . Empyema lung (HCC)   . Substance induced mood disorder (HCC)   . Abdominal pain 06/08/2018  . Hepatitis C, chronic (HCC) 06/08/2018  . 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 Septic embolism (HCC) 06/08/2018  . Multifocal pneumonia 06/08/2018  . Tobacco abuse 06/08/2018  . Polysubstance abuse (HCC) 04/07/2017  . Leukocytosis 04/07/2017  . Alcohol dependence with unspecified alcohol-induced disorder (HCC)   . Opiate overdose (HCC) 12/13/2013  . Encephalopathy acute 12/13/2013  . Acute on chronic respiratory failure with hypoxia (HCC) 12/13/2013    Past Surgical History:  Procedure Laterality Date  . ARM AMPUTATION    . MANDIBLE FRACTURE SURGERY    . TEE WITHOUT CARDIOVERSION N/A 06/13/2018   Procedure: TRANSESOPHAGEAL ECHOCARDIOGRAM (TEE);  Surgeon: Wendall Stade, MD;  Location: [facility] ENDOSCOPY;  Service: Cardiovascular;  Laterality: N/A;        Home Medications    Prior to Admission medications   Medication Sig Start Date End Date Taking? Authorizing Provider   cephALEXin (KEFLEX) 500 MG capsule Take 1 capsule (500 mg total) by mouth 3 (three) times daily. 06/22/18 09/10/18 Yes Ginnie Smart, MD  Omega-3 Fatty Acids (FISH OIL) 1000 MG CPDR Take 1,000 mg by mouth daily.   Yes [provider]  vitamin B-12 (CYANOCOBALAMIN) 1000 MCG tablet Take 1,000 mcg by mouth daily.   Yes [provider]  loperamide (IMODIUM) 2 MG capsule Take 1 capsule (2 mg total) by mouth 4 (four) times daily as needed for diarrhea or loose stools. Patient not taking: Reported on 06/02/2018 11/19/17   Caccavale, Sophia, PA-C  meloxicam (MOBIC) 7.5 MG tablet Take 1 tablet (7.5 mg total) by mouth daily. Patient not taking: Reported on 11/19/2017 04/01/17   Cristina Gong, PA-C  ondansetron (ZOFRAN) 4 MG tablet Take 1 tablet (4 mg total) by mouth every 8 (eight) hours as needed for nausea or vomiting. Patient not taking: Reported on 06/02/2018 11/19/17   Caccavale, Sophia, PA-C  risperiDONE (RISPERDAL) 2 MG tablet Take 1 tablet (2 mg total) by mouth at bedtime. Patient not taking: Reported on 03/31/2017 07/24/14   Rolland Porter, MD  traMADol (ULTRAM) 50 MG tablet Take 1 tablet (50 mg total) by mouth every 6 (six) hours as needed. Patient not taking: Reported on 11/19/2017 10/02/17   Jacalyn Lefevre, MD    Family History Family History  Problem Relation Age of Onset  . Cancer Mother     Social History Social History   Tobacco Use  .  Smoking status: Current Every Day Smoker    Packs/day: 1.00    Types: Cigarettes  . Smokeless tobacco: Never Used  Substance Use Topics  . Alcohol use: No    Frequency: Never  . Drug use: Yes    Types: Cocaine, IV    Comment: Heroin, ice, crack, and morphine pills     Allergies   Patient has no known allergies.   Review of Systems Review of Systems  Constitutional: Positive for fatigue.  Respiratory: Positive for shortness of breath.   Cardiovascular: Positive for chest pain.  All other systems reviewed and are  negative.    Physical Exam Updated Vital Signs BP (!) 129/92 (BP Location: Left Arm)   Pulse 81   Temp 99.3 F (37.4 C) (Oral)   Resp 16   Ht 5' 10.98" (1.803 m)   Wt 75.7 kg   SpO2 97%   BMI 23.27 kg/m   Physical Exam  Constitutional: He is oriented to person, place, and time. He appears well-developed and well-nourished. No distress.  HENT:  Head: Normocephalic and atraumatic.  Right Ear: Hearing normal.  Left Ear: Hearing normal.  Nose: Nose normal.  Mouth/Throat: Oropharynx is clear and moist and mucous membranes are normal.  Eyes: Pupils are equal, round, and reactive to light. Conjunctivae and EOM are normal.  Neck: Normal range of motion. Neck supple.  Cardiovascular: Regular rhythm, S1 normal and S2 normal. Exam reveals no gallop and no friction rub.  No murmur heard. Pulmonary/Chest: Effort normal and breath sounds normal. No respiratory distress. He exhibits no tenderness.  Abdominal: Soft. Normal appearance and bowel sounds are normal. There is no hepatosplenomegaly. There is no tenderness. There is no rebound, no guarding, no tenderness at McBurney's point and negative Murphy's sign. No hernia.  Musculoskeletal: Normal range of motion.  Neurological: He is alert and oriented to person, place, and time. He has normal strength. No cranial nerve deficit or sensory deficit. Coordination normal. GCS eye subscore is 4. GCS verbal subscore is 5. GCS motor subscore is 6.  Skin: Skin is warm, dry and intact. No rash noted. No cyanosis.  Psychiatric: He has a normal mood and affect. His speech is normal and behavior is normal. Thought content normal.  Nursing note and vitals reviewed.    ED Treatments / Results  Labs (all labs ordered are listed, but only abnormal results are displayed) Labs Reviewed  CBC WITH DIFFERENTIAL/PLATELET - Abnormal; Notable for the following components:      Result Value   WBC 11.7 (*)    RBC 2.54 (*)    Hemoglobin 7.0 (*)    HCT 23.7 (*)     MCHC 29.5 (*)    Neutro Abs 8.1 (*)    Eosinophils Absolute 0.7 (*)    Abs Immature Granulocytes 0.26 (*)    All other components within normal limits  COMPREHENSIVE METABOLIC PANEL - Abnormal; Notable for the following components:   Glucose, Bld 106 (*)    Creatinine, Ser 1.39 (*)    Calcium 7.8 (*)    Albumin 1.8 (*)    AST 48 (*)    All other components within normal limits  BRAIN NATRIURETIC PEPTIDE - Abnormal; Notable for the following components:   B Natriuretic Peptide 477.0 (*)    All other components within normal limits  SEDIMENTATION RATE - Abnormal; Notable for the following components:   Sed Rate 90 (*)    All other components within normal limits  C-REACTIVE PROTEIN - Abnormal; Notable for  the following components:   CRP 2.7 (*)    All other components within normal limits  CULTURE, BLOOD (ROUTINE X 2)  CULTURE, BLOOD (ROUTINE X 2)  I-STAT TROPONIN, ED    EKG EKG Interpretation  Date/Time:  Wednesday June 29 2018 00:48:45 EDT Ventricular Rate:  83 PR Interval:    QRS Duration: 101 QT Interval:  387 QTC Calculation: 455 R Axis:   100 Text Interpretation:  Sinus rhythm Right axis deviation Confirmed by Gilda Crease [phone]) on 06/29/2018 12:53:47 AM   Radiology Dg Chest 2 View  Result Date: 06/29/2018 CLINICAL DATA:  Shortness of breath. Chest pain. Recent hospital admission for endocarditis, left AMA. EXAM: CHEST - 2 VIEW COMPARISON:  Radiograph 06/20/2018.  CT 06/16/2018 FINDINGS: Improving lung aeration with multifocal nodular opacities, many of these were necrotic on prior CT. Left upper lobe nodule is necrotic by radiograph. Decreased but residual small bilateral pleural effusions. Decreased cardiomegaly. Unchanged mediastinal contours. No pneumothorax. IMPRESSION: Radiographic improvement compared to prior exam with decreased pleural effusions and bibasilar opacities. Multiple pulmonary nodules consistent with septic emboli again seen, many  of which are cavitary on prior CT. Electronically Signed   By: Narda Rutherford M.D.   On: 06/29/2018 01:37   Ct Angio Chest Pe W Or Wo Contrast  Result Date: 06/29/2018 CLINICAL DATA:  Shortness of breath. Recent hospital admission for endocarditis and septic emboli, left AMA. Now feels worse. EXAM: CT ANGIOGRAPHY CHEST WITH CONTRAST TECHNIQUE: Multidetector CT imaging of the chest was performed using the standard protocol during bolus administration of intravenous contrast. Multiplanar CT image reconstructions and MIPs were obtained to evaluate the vascular anatomy. CONTRAST:  ISOVUE-370 IOPAMIDOL (ISOVUE-370) INJECTION 76% COMPARISON:  Radiographs earlier this day.  Chest CT 06/16/2018 FINDINGS: Cardiovascular: There are no filling defects within the pulmonary arteries to suggest pulmonary embolus. The thoracic aorta is normal in caliber without dissection. The heart is normal in size. No pericardial effusion. Mediastinum/Nodes: Persistent enlarged right supraclavicular node measuring 12 mm, previously 16 mm. Vague subcarinal adenopathy is again seen, not significantly changed. Indistinct bilateral hilar nodes are also not significantly changed. Esophagus is nondistended. Lungs/Pleura: Innumerable cavitary and non cavitary nodules throughout all lobes of both lungs consistent with septic emboli. Overall improvement in size from prior exam. Dominant right apical cavitary process measures 3.1 x 1.9 cm image 26 series 6, previously 3.5 x 4.1 cm. Bilateral pleural effusions, small to moderate. These have improved from prior exam. Mild pleural enhancement. No pulmonary edema. Trachea and mainstem bronchi are patent. Upper Abdomen: No acute finding. Musculoskeletal: The previous fluid collection tracking along the right subscapularis muscle is less well-defined, and likely improved, incompletely characterized on chest CT. There is whole body wall edema. No bony destructive change. Review of the MIP images  confirms the above findings. IMPRESSION: 1. Persistent bilateral septic emboli, improved from 06/16/2018 CT. 2. Small to moderate bilateral pleural effusions, right greater than left, also improved. 3. Previous perimuscular fluid collection about the right shoulder is not as well seen on the current exam. 4. No acute pulmonary embolus. Electronically Signed   By: Narda Rutherford M.D.   On: 06/29/2018 04:30    Procedures Procedures (including critical care time)  Medications Ordered in ED Medications  sodium chloride 0.9 % injection (has no administration in time range)  iopamidol (ISOVUE-370) 76 % injection (has no administration in time range)  enoxaparin (LOVENOX) injection 40 mg (has no administration in time range)  iopamidol (ISOVUE-370) 76 % injection 100 mL (  100 mLs Intravenous Contrast Given 06/29/18 0402)     Initial Impression / Assessment and Plan / ED Course  I have reviewed the triage vital signs and the nursing notes.  Pertinent labs & imaging results that were available during my care of the patient were reviewed by me and considered in my medical decision making (see chart for details).    Patient presents to the ER for evaluation of shortness of breath and chest discomfort.  Patient has a complex medical history.  Patient has a history of IV drug abuse, was hospitalized for tricuspid endocarditis.  He stayed in the hospital for approximately 2 weeks but then left AMA.  He has been on oral Keflex since he left the hospital, but has progressed only worsened.  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  He has had aggressive worsening of chest pain, shortness of breath.  CT angios shows persistent bilateral septic emboli.  We will readmit the patient for continued IV antibiotics and management by infectious  disease.  CRITICAL CARE Performed by: Gilda Crease   Total critical care time: 30 minutes  Critical care time was exclusive of separately billable procedures and treating other patients.  Critical care was necessary to treat or prevent imminent or life-threatening deterioration.  Critical care was time spent personally by me on the following activities: development of treatment plan with patient and/or surrogate as well as nursing, discussions with consultants, evaluation of patient's response to treatment, examination of patient, obtaining history from patient or surrogate, ordering and performing treatments and interventions, ordering and review of laboratory studies, ordering and review of radiographic studies, pulse oximetry and re-evaluation of patient's condition.    Final Clinical Impressions(s) / ED Diagnoses   Final diagnoses:  Subacute bacterial endocarditis    ED Discharge Orders    None       Gilda Crease, MD 06/29/18 [phone]

## 2018-06-29 NOTE — ED Notes (Signed)
Patient transported to X-ray 

## 2018-06-29 NOTE — H&P (Signed)
History and Physical    Oscar Burke ACZ:[id number] DOB: Apr 26, 1981 DOA: 06/28/2018  PCP: Patient, No Pcp Per  Patient coming from: Home    Chief Complaint: Heaviness on the chest and chest pain  HPI: Oscar Burke is a 37 y.o. male with medical history significant of MSSA tricuspid valve endocarditis left AGAINST MEDICAL ADVICE on June 21, 2018 after receiving 2-1/2 weeks of IV antibiotics, readmitted with complaints of pleuritic chest pain dyspnea on exertion.  Patient reports he had these symptoms even when he left the hospital but the symptoms have been just worsening over the last 2 to 3 days.  He admits to subjective fever and chills at home but has not checked her temperature.  Patient reports taking Keflex since discharge but has complaints of increased chest discomfort shortness of breath with dyspnea on exertion and fatigue.  He has not gone back to work.  He works as a Pension scheme manager.  Denies any nausea vomiting diarrhea abdominal pain.  He has bilateral lower extremity edema which is worse than usual. ED Course: CT scan of the chest shows persistent bilateral septic emboli improved from 06/16/2018 CT.  Small to moderate bilateral pleural effusion right greater than left also improved.  Previous perimuscular fluid collection about the right shoulder is not as well-seen on the current exam no acute PE.  Review of Systems: Positive for generalized mellitus, decreased appetite, increasing shortness of breath and dyspnea on exertion increasing lower extremity edema and pleuritic chest pain, patient had subjective fever and chills.  Past Medical History:  Diagnosis Date  . Bipolar 1 disorder ([place])   . Chronic back pain   . Hepatitis C   . Polysubstance abuse ([place])   . Smoker     Past Surgical History:  Procedure Laterality Date  . ARM AMPUTATION    . MANDIBLE FRACTURE SURGERY    . TEE WITHOUT CARDIOVERSION N/A 06/13/2018   Procedure: TRANSESOPHAGEAL ECHOCARDIOGRAM (TEE);   Surgeon: Josue Hector, MD;  Location: [facility] ENDOSCOPY;  Service: Cardiovascular;  Laterality: N/A;     reports that he has been smoking cigarettes. He has been smoking about 1.00 pack per day. He has never used smokeless tobacco. He reports that he has current or past drug history. Drugs: Cocaine and IV. He reports that he does not drink alcohol.  No Known Allergies  Family History  Problem Relation Age of Onset  . Cancer Mother      Prior to Admission medications   Medication Sig Start Date End Date Taking? Authorizing Provider  cephALEXin (KEFLEX) 500 MG capsule Take 1 capsule (500 mg total) by mouth 3 (three) times daily. 06/22/18 09/10/18 Yes Campbell Riches, MD  Omega-3 Fatty Acids (FISH OIL) 1000 MG CPDR Take 1,000 mg by mouth daily.   Yes [provider]  vitamin B-12 (CYANOCOBALAMIN) 1000 MCG tablet Take 1,000 mcg by mouth daily.   Yes [provider]  loperamide (IMODIUM) 2 MG capsule Take 1 capsule (2 mg total) by mouth 4 (four) times daily as needed for diarrhea or loose stools. Patient not taking: Reported on 06/02/2018 11/19/17   Caccavale, Sophia, PA-C  meloxicam (MOBIC) 7.5 MG tablet Take 1 tablet (7.5 mg total) by mouth daily. Patient not taking: Reported on 11/19/2017 04/01/17   Lorin Glass, PA-C  ondansetron (ZOFRAN) 4 MG tablet Take 1 tablet (4 mg total) by mouth every 8 (eight) hours as needed for nausea or vomiting. Patient not taking: Reported on 06/02/2018 11/19/17   Franchot Heidelberg, PA-C  risperiDONE (RISPERDAL) 2 MG tablet Take 1 tablet (2 mg total) by mouth at bedtime. Patient not taking: Reported on 03/31/2017 07/24/14   Tanna Furry, MD  traMADol (ULTRAM) 50 MG tablet Take 1 tablet (50 mg total) by mouth every 6 (six) hours as needed. Patient not taking: Reported on 11/19/2017 10/02/17   Isla Pence, MD    Physical Exam: Vitals:   06/29/18 0530 06/29/18 0600 06/29/18 0630 06/29/18 0652  BP: (!) 132/91 130/90 126/87 (!) 129/92    Pulse: 81 80 84 81  Resp: _0 Temp:    99.3 F (37.4 C)  TempSrc:    Oral  SpO2: 98% 96% 95% 97%  Weight:    75.7 kg  Height:    5' 10.98" (1.803 m)    Constitutional: NAD, calm, comfortable Vitals:   06/29/18 0530 06/29/18 0600 06/29/18 0630 06/29/18 0652  BP: (!) 132/91 130/90 126/87 (!) 129/92  Pulse: 81 80 84 81  Resp: _1 Temp:    99.3 F (37.4 C)  TempSrc:    Oral  SpO2: 98% 96% 95% 97%  Weight:    75.7 kg  Height:    5' 10.98" (1.803 m)   Eyes: PERRL, lids and conjunctivae normal ENMT: Mucous membranes are moist. Posterior pharynx clear of any exudate or lesions.Normal dentition.  Neck: normal, supple, no masses, no thyromegaly Respiratory: clear to auscultation bilaterally, no wheezing, no crackles. Normal respiratory effort. No accessory muscle use.  Decreased bowel sounds at the bases with scattered rhonchi. Cardiovascular: Regular rate and rhythm, no murmurs / rubs / gallops. No extremity edema. 2+ pedal pulses. No carotid bruits.  Abdomen: no tenderness, no masses palpated. No hepatosplenomegaly. Bowel sounds positive.  Musculoskeletal 2 PLUS EDEMA  Skin: no rashes, lesions, ulcers. No induration Neurologic: CN 2-12 grossly intact. Sensation intact, DTR normal. Strength 5/5 in all 4.  Psychiatric: Normal judgment and insight. Alert and oriented x 3. Normal mood.    Labs on Admission: I have personally reviewed following labs and imaging studies  CBC: Recent Labs  Lab 06/29/18 0051  WBC 11.7*  NEUTROABS 8.1*  HGB 7.0*  HCT 23.7*  MCV 93.3  PLT 470   Basic Metabolic Panel: Recent Labs  Lab 06/29/18 0051  NA 136  K 3.6  CL 102  CO2 28  GLUCOSE 106*  BUN 16  CREATININE 1.39*  CALCIUM 7.8*   GFR: Estimated Creatinine Clearance: 76 mL/min (A) (by C-G formula based on SCr of 1.39 mg/dL (H)). Liver Function Tests: Recent Labs  Lab 06/29/18 0051  AST 48*  ALT 36  ALKPHOS 99  BILITOT 0.5  PROT 6.5  ALBUMIN 1.8*   No  results for input(s): LIPASE, AMYLASE in the last 168 hours. No results for input(s): AMMONIA in the last 168 hours. Coagulation Profile: No results for input(s): INR, PROTIME in the last 168 hours. Cardiac Enzymes: No results for input(s): CKTOTAL, CKMB, CKMBINDEX, TROPONINI in the last 168 hours. BNP (last 3 results) No results for input(s): PROBNP in the last 8760 hours. HbA1C: No results for input(s): HGBA1C in the last 72 hours. CBG: No results for input(s): GLUCAP in the last 168 hours. Lipid Profile: No results for input(s): CHOL, HDL, LDLCALC, TRIG, CHOLHDL, LDLDIRECT in the last 72 hours. Thyroid Function Tests: No results for input(s): TSH, T4TOTAL, FREET4, T3FREE, THYROIDAB in the last 72 hours. Anemia Panel: No results for input(s): VITAMINB12, FOLATE, FERRITIN, TIBC, IRON, RETICCTPCT in the last 72 hours. Urine analysis:  Component Value Date/Time   COLORURINE AMBER (A) 06/08/2018 0811   APPEARANCEUR HAZY (A) 06/08/2018 0811   LABSPEC 1.017 06/08/2018 0811   PHURINE 5.0 06/08/2018 0811   GLUCOSEU NEGATIVE 06/08/2018 0811   HGBUR MODERATE (A) 06/08/2018 0811   BILIRUBINUR NEGATIVE 06/08/2018 0811   KETONESUR NEGATIVE 06/08/2018 0811   PROTEINUR 30 (A) 06/08/2018 0811   UROBILINOGEN 2.0 (H) 12/17/2013 0848   NITRITE NEGATIVE 06/08/2018 0811   LEUKOCYTESUR NEGATIVE 06/08/2018 0811    Radiological Exams on Admission: Dg Chest 2 View  Result Date: 06/29/2018 CLINICAL DATA:  Shortness of breath. Chest pain. Recent hospital admission for endocarditis, left AMA. EXAM: CHEST - 2 VIEW COMPARISON:  Radiograph 06/20/2018.  CT 06/16/2018 FINDINGS: Improving lung aeration with multifocal nodular opacities, many of these were necrotic on prior CT. Left upper lobe nodule is necrotic by radiograph. Decreased but residual small bilateral pleural effusions. Decreased cardiomegaly. Unchanged mediastinal contours. No pneumothorax. IMPRESSION: Radiographic improvement compared to  prior exam with decreased pleural effusions and bibasilar opacities. Multiple pulmonary nodules consistent with septic emboli again seen, many of which are cavitary on prior CT. Electronically Signed   By: Keith Rake M.D.   On: 06/29/2018 01:37   Ct Angio Chest Pe W Or Wo Contrast  Result Date: 06/29/2018 CLINICAL DATA:  Shortness of breath. Recent hospital admission for endocarditis and septic emboli, left AMA. Now feels worse. EXAM: CT ANGIOGRAPHY CHEST WITH CONTRAST TECHNIQUE: Multidetector CT imaging of the chest was performed using the standard protocol during bolus administration of intravenous contrast. Multiplanar CT image reconstructions and MIPs were obtained to evaluate the vascular anatomy. CONTRAST:  114m ISOVUE-370 IOPAMIDOL (ISOVUE-370) INJECTION 76% COMPARISON:  Radiographs earlier this day.  Chest CT 06/16/2018 FINDINGS: Cardiovascular: There are no filling defects within the pulmonary arteries to suggest pulmonary embolus. The thoracic aorta is normal in caliber without dissection. The heart is normal in size. No pericardial effusion. Mediastinum/Nodes: Persistent enlarged right supraclavicular node measuring 12 mm, previously 16 mm. Vague subcarinal adenopathy is again seen, not significantly changed. Indistinct bilateral hilar nodes are also not significantly changed. Esophagus is nondistended. Lungs/Pleura: Innumerable cavitary and non cavitary nodules throughout all lobes of both lungs consistent with septic emboli. Overall improvement in size from prior exam. Dominant right apical cavitary process measures 3.1 x 1.9 cm image 26 series 6, previously 3.5 x 4.1 cm. Bilateral pleural effusions, small to moderate. These have improved from prior exam. Mild pleural enhancement. No pulmonary edema. Trachea and mainstem bronchi are patent. Upper Abdomen: No acute finding. Musculoskeletal: The previous fluid collection tracking along the right subscapularis muscle is less well-defined, and  likely improved, incompletely characterized on chest CT. There is whole body wall edema. No bony destructive change. Review of the MIP images confirms the above findings. IMPRESSION: 1. Persistent bilateral septic emboli, improved from 06/16/2018 CT. 2. Small to moderate bilateral pleural effusions, right greater than left, also improved. 3. Previous perimuscular fluid collection about the right shoulder is not as well seen on the current exam. 4. No acute pulmonary embolus. Electronically Signed   By: MKeith RakeM.D.   On: 06/29/2018 04:30    Assessment/Plan Active Problems:   Septic embolism (HCC)   Endocarditis   1] MSSA tricuspid valve endocarditis/bilateral septic pulmonary emboli-patient admitted with increasing pleuritic chest pain and shortness of breath with subjective fever and chills at home.  [facility]  I will restart Ancef.  Blood cultures drawn in the ER.  Discussed with Dr. HJohnnye Simawho will  see the patient in consultation.  Patient reports he was taking Keflex at home faithfully.  His ESR is 90 and CRP is 2.7.  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  Last echo that was done 06/13/2018 transesophageal echo showed 2 cm large vegetation on the lateral leaflet of the tricuspid valve with moderate to severe tricuspid regurgitation.  Ejection fraction was 60 to 65%.  Patient admitted with dyspnea on exertion this time.  Patient has elevated BNP and chest x-ray with bilateral pleural effusion though better.  Patient is currently on room air saturation normal.  I will not start him on any diuretics at this time since effusion has improved by x-ray and patient laying flat on room air.  Monitor him closely.  Troponin 0 0.01.  His albumin is very low at 1.8 which is probably contributing to lower extremity edema.  EKG shows normal sinus rhythm no acute ST-T wave changes.  3] anemia of chronic disease hemoglobin on discharge was 7.3 and  today at 7.0 monitor daily and transfuse if needed.  His INR is very low at 13-hour hold off on IV iron in view of active infection.  4] history of hepatitis C patient to follow-up with ID clinic for treatment.   DVT prophylaxis: Lovenox Code Status: Full code Family Communication: Patient had a family member in the room Disposition Plan: Pending clinical improvement Consults called: Infectious disease Dr. Johnnye Sima Admission status: Inpatient  Georgette Shell MD [facility]  If 7PM-7AM, please contact night-coverage [email or web address] Password [facility]  06/29/2018, 8:38 AM

## 2018-06-29 NOTE — Progress Notes (Signed)
Pharmacy Antibiotic Note  Eyan Hagood is a 37 y.o. male admitted on 06/28/2018 with endocarditis.  Pharmacy has been consulted for cefazolin dosing. Pt with hx of MSSA tricuspid valve endocarditis.   Pt previously here for 6 weeks of cefazolin but left AMA. Per notes received about 2 and a half weeks of Ancef before leaving AMA. Per notes pt states that he was taking cephalexin 500 mg PO QID upon leaving.   Plan:  Cefazolin 2 gr IV q8h  Monitor clinical course, renal function, cultures as available   Dosage will likey remain stable at above dosage and need for further dosage adjustment appears unlikely at present.    Will sign off at this time.  Please reconsult if a change in clinical status warrants re-evaluation of dosage.     Height: 5' 10.98" (180.3 cm) Weight: 166 lb 12.8 oz (75.7 kg) IBW/kg (Calculated) : 75.26  Temp (24hrs), Avg:99 F (37.2 C), Min:98.7 F (37.1 C), Max:99.3 F (37.4 C)  Recent Labs  Lab 06/29/18 0051  WBC 11.7*  CREATININE 1.39*    Estimated Creatinine Clearance: 76 mL/min (A) (by C-G formula based on SCr of 1.39 mg/dL (H)).    No Known Allergies  Antimicrobials this admission: 10/23 cefazolin >>   Dose adjustments this admission:   Microbiology results: 10/23 BCx:    Thank you for allowing pharmacy to be a part of this patient's care.   Adalberto Cole, PharmD, BCPS Pager [phone] 06/29/2018 9:09 AM

## 2018-06-29 NOTE — ED Notes (Signed)
ED TO INPATIENT HANDOFF REPORT  Name/Age/Gender Oscar Burke 37 y.o. male  Code Status Code Status History    Date Active Date Inactive Code Status Order ID Comments User Context   06/08/2018 1039 06/21/2018 2202 Full Code [id number]  Annita Brod, MD ED   04/07/2017 2257 04/08/2017 0335 Full Code [id number]  Vianne Bulls, MD ED   07/22/2014 0705 07/24/2014 1628 Full Code [id number]  Francine Graven, DO ED   12/13/2013 1024 12/18/2013 1402 Full Code [id number]  Germain Osgood, PA-C ED      Home/SNF/Other Home  Chief Complaint SOB  Level of Care/Admitting Diagnosis ED Disposition    ED Disposition Condition Comment   [place] [facility]: [facility] [[id number]]  Level of Care: Telemetry [5]  Admit to tele based on following criteria: Monitor for Ischemic changes  Diagnosis: Septic embolism [facility]) [[id number]]  Admitting Physician: Rise Patience [phone]  Attending Physician: Rise Patience [phone]  Estimated length of stay: past midnight tomorrow  Certification:: I certify this patient will need inpatient services for at least 2 midnights  PT Class (Do Not Modify): Inpatient [101]  PT Acc Code (Do Not Modify): Private [1]       Medical History Past Medical History:  Diagnosis Date  . Bipolar 1 disorder ([place])   . Chronic back pain   . Hepatitis C   . Polysubstance abuse ([place])   . Smoker     Allergies No Known Allergies  IV Location/Drains/Wounds Patient Lines/Drains/Airways Status   Active Line/Drains/Airways    Name:   Placement date:   Placement time:   Site:   Days:   Peripheral IV 06/29/18 Left Antecubital   06/29/18    0100    Antecubital   less than 1   Peripheral IV 06/29/18 Right Forearm   06/29/18    0108    Forearm   less than 1   Wound / Incision (Open or Dehisced) 06/12/18 Other (Comment) Foot Right;Other (Comment)   06/12/18    -    Foot   17          Labs/Imaging Results for orders placed or performed during the  hospital encounter of 06/28/18 (from the past 48 hour(s))  CBC with Differential/Platelet     Status: Abnormal   Collection Time: 06/29/18 12:51 AM  Result Value Ref Range   WBC 11.7 (H) 4.0 - 10.5 K/uL   RBC 2.54 (L) 4.22 - 5.81 MIL/uL   Hemoglobin 7.0 (L) 13.0 - 17.0 g/dL   HCT 23.7 (L) 39.0 - 52.0 %   MCV 93.3 80.0 - 100.0 fL   MCH 27.6 26.0 - 34.0 pg   MCHC 29.5 (L) 30.0 - 36.0 g/dL   RDW 13.8 11.5 - 15.5 %   Platelets 384 150 - 400 K/uL   nRBC 0.0 0.0 - 0.2 %   Neutrophils Relative % 70 %   Neutro Abs 8.1 (H) 1.7 - 7.7 K/uL   Lymphocytes Relative 15 %   Lymphs Abs 1.8 0.7 - 4.0 K/uL   Monocytes Relative 6 %   Monocytes Absolute 0.7 0.1 - 1.0 K/uL   Eosinophils Relative 6 %   Eosinophils Absolute 0.7 (H) 0.0 - 0.5 K/uL   Basophils Relative 1 %   Basophils Absolute 0.1 0.0 - 0.1 K/uL   Immature Granulocytes 2 %   Abs Immature Granulocytes 0.26 (H) 0.00 - 0.07 K/uL    Comment: Performed at [facility], [address]  [place], [place] [address]  Comprehensive metabolic panel     Status: Abnormal   Collection Time: 06/29/18 12:51 AM  Result Value Ref Range   Sodium 136 135 - 145 mmol/L   Potassium 3.6 3.5 - 5.1 mmol/L   Chloride 102 98 - 111 mmol/L   CO2 28 22 - 32 mmol/L   Glucose, Bld 106 (H) 70 - 99 mg/dL   BUN 16 6 - 20 mg/dL   Creatinine, Ser 1.39 (H) 0.61 - 1.24 mg/dL   Calcium 7.8 (L) 8.9 - 10.3 mg/dL   Total Protein 6.5 6.5 - 8.1 g/dL   Albumin 1.8 (L) 3.5 - 5.0 g/dL   AST 48 (H) 15 - 41 U/L   ALT 36 0 - 44 U/L   Alkaline Phosphatase 99 38 - 126 U/L   Total Bilirubin 0.5 0.3 - 1.2 mg/dL   GFR calc non Af Amer >60 >60 mL/min   GFR calc Af Amer >60 >60 mL/min    Comment: (NOTE) The eGFR has been calculated using the CKD EPI equation. This calculation has not been validated in all clinical situations. eGFR's persistently <60 mL/min signify possible Chronic Kidney Disease.    Anion gap 6 5 - 15    Comment: Performed at [facility], [place] [address] [place], [place] [address]  Brain natriuretic peptide     Status: Abnormal   Collection Time: 06/29/18 12:51 AM  Result Value Ref Range   B Natriuretic Peptide 477.0 (H) 0.0 - 100.0 pg/mL    Comment: Performed at [facility], [place] [address], [place], [place] [address]  Sedimentation rate     Status: Abnormal   Collection Time: 06/29/18 12:51 AM  Result Value Ref Range   Sed Rate 90 (H) 0 - 16 mm/hr    Comment: Performed at [facility], [place] [address] [place], [place] [address]  C-reactive protein     Status: Abnormal   Collection Time: 06/29/18 12:51 AM  Result Value Ref Range   CRP 2.7 (H) <1.0 mg/dL    Comment: Performed at [facility], [place] [address], [place], [place] [address]  Culture, blood (Routine X 2) w Reflex to ID Panel     Status: None (Preliminary result)   Collection Time: 06/29/18 12:52 AM  Result Value Ref Range   Specimen Description      BLOOD RIGHT FOREARM Performed at [facility], [place] [address], [place], [place] [address]    Special Requests      BOTTLES DRAWN AEROBIC AND ANAEROBIC Blood Culture adequate volume Performed at [place] [address], [place],  [address]    Culture PENDING    Report Status PENDING   I-stat troponin, ED     Status: None   Collection Time: 06/29/18  1:07 AM  Result Value Ref Range   Troponin i, poc 0.01 0.00 - 0.08 ng/mL   Comment 3            Comment: Due to the release kinetics of cTnI, a negative result within the first hours of the onset of symptoms does not rule out myocardial infarction with certainty. If myocardial infarction is still suspected, repeat the test at appropriate intervals.    Dg Chest 2 View  Result Date: 06/29/2018 CLINICAL DATA:  Shortness of breath. Chest pain. Recent hospital admission for endocarditis, left AMA. EXAM: CHEST - 2 VIEW COMPARISON:  Radiograph  06/20/2018.  CT 06/16/2018 FINDINGS: Improving lung  aeration with multifocal nodular opacities, many of these were necrotic on prior CT. Left upper lobe nodule is necrotic by radiograph. Decreased but residual small bilateral pleural effusions. Decreased cardiomegaly. Unchanged mediastinal contours. No pneumothorax. IMPRESSION: Radiographic improvement compared to prior exam with decreased pleural effusions and bibasilar opacities. Multiple pulmonary nodules consistent with septic emboli again seen, many of which are cavitary on prior CT. Electronically Signed   By: Keith Rake M.D.   On: 06/29/2018 01:37   Ct Angio Chest Pe W Or Wo Contrast  Result Date: 06/29/2018 CLINICAL DATA:  Shortness of breath. Recent hospital admission for endocarditis and septic emboli, left AMA. Now feels worse. EXAM: CT ANGIOGRAPHY CHEST WITH CONTRAST TECHNIQUE: Multidetector CT imaging of the chest was performed using the standard protocol during bolus administration of intravenous contrast. Multiplanar CT image reconstructions and MIPs were obtained to evaluate the vascular anatomy. CONTRAST:  126m ISOVUE-370 IOPAMIDOL (ISOVUE-370) INJECTION 76% COMPARISON:  Radiographs earlier this day.  Chest CT 06/16/2018 FINDINGS: Cardiovascular: There are no filling defects within the pulmonary arteries to suggest pulmonary embolus. The thoracic aorta is normal in caliber without dissection. The heart is normal in size. No pericardial effusion. Mediastinum/Nodes: Persistent enlarged right supraclavicular node measuring 12 mm, previously 16 mm. Vague subcarinal adenopathy is again seen, not significantly changed. Indistinct bilateral hilar nodes are also not significantly changed. Esophagus is nondistended. Lungs/Pleura: Innumerable cavitary and non cavitary nodules throughout all lobes of both lungs consistent with septic emboli. Overall improvement in size from prior exam. Dominant right apical cavitary process measures 3.1 x 1.9 cm  image 26 series 6, previously 3.5 x 4.1 cm. Bilateral pleural effusions, small to moderate. These have improved from prior exam. Mild pleural enhancement. No pulmonary edema. Trachea and mainstem bronchi are patent. Upper Abdomen: No acute finding. Musculoskeletal: The previous fluid collection tracking along the right subscapularis muscle is less well-defined, and likely improved, incompletely characterized on chest CT. There is whole body wall edema. No bony destructive change. Review of the MIP images confirms the above findings. IMPRESSION: 1. Persistent bilateral septic emboli, improved from 06/16/2018 CT. 2. Small to moderate bilateral pleural effusions, right greater than left, also improved. 3. Previous perimuscular fluid collection about the right shoulder is not as well seen on the current exam. 4. No acute pulmonary embolus. Electronically Signed   By: MKeith RakeM.D.   On: 06/29/2018 04:30   EKG Interpretation  Date/Time:  Wednesday June 29 2018 00:48:45 EDT Ventricular Rate:  83 PR Interval:    QRS Duration: 101 QT Interval:  387 QTC Calculation: 455 R Axis:   100 Text Interpretation:  Sinus rhythm Right axis deviation Confirmed by POrpah Greek([phone] on 06/29/2018 12:53:47 AM   Pending Labs Unresulted Labs (From admission, onward)    Start     Ordered   06/29/18 0043  Culture, blood (Routine X 2) w Reflex to ID Panel  BLOOD CULTURE X 2,   R     06/29/18 0042          Vitals/Pain Today's Vitals   06/29/18 0430 06/29/18 0500 06/29/18 0530 06/29/18 0600  BP: 132/79 127/88 (!) 132/91 130/90  Pulse: 82 82 81 80  Resp: _0 Temp:      TempSrc:      SpO2: 98% 95% 98% 96%  PainSc:        Isolation Precautions No active isolations  Medications Medications  sodium chloride 0.9 % injection (has no administration in time range)  iopamidol (ISOVUE-370) 76 % injection (has no administration in time range)  iopamidol (ISOVUE-370) 76 % injection  100 mL (100 mLs Intravenous Contrast Given 06/29/18 0402)    Mobility walks

## 2018-06-30 DIAGNOSIS — F1123 Opioid dependence with withdrawal: Secondary | ICD-10-CM

## 2018-06-30 DIAGNOSIS — F419 Anxiety disorder, unspecified: Secondary | ICD-10-CM

## 2018-06-30 MED ORDER — LORAZEPAM 1 MG PO TABS
1.0000 mg | ORAL_TABLET | Freq: Four times a day (QID) | ORAL | Status: DC | PRN
  Administered 2018-06-30 – 2018-07-01 (×2): 1 mg via ORAL
  Filled 2018-06-30 (×2): qty 1

## 2018-06-30 MED ORDER — THIAMINE HCL 100 MG/ML IJ SOLN
100.0000 mg | Freq: Every day | INTRAMUSCULAR | Status: DC
  Administered 2018-06-30: 100 mg via INTRAVENOUS
  Filled 2018-06-30: qty 2

## 2018-06-30 MED ORDER — BUPRENORPHINE HCL-NALOXONE HCL 8-2 MG SL SUBL: 1.0000 | SUBLINGUAL_TABLET | Freq: Two times a day (BID) | SUBLINGUAL | Status: DC

## 2018-06-30 MED ORDER — VITAMIN B-1 100 MG PO TABS
100.0000 mg | ORAL_TABLET | Freq: Every day | ORAL | Status: DC
  Filled 2018-06-30: qty 1

## 2018-06-30 MED ORDER — ONDANSETRON HCL 4 MG/2ML IJ SOLN
4.0000 mg | INTRAMUSCULAR | Status: DC | PRN
  Administered 2018-06-30: 4 mg via INTRAVENOUS
  Filled 2018-06-30: qty 2

## 2018-06-30 MED ORDER — BUPRENORPHINE HCL-NALOXONE HCL 8-2 MG SL SUBL
1.0000 | SUBLINGUAL_TABLET | Freq: Every day | SUBLINGUAL | Status: DC
  Administered 2018-06-30 – 2018-07-01 (×2): 1 via SUBLINGUAL
  Filled 2018-06-30 (×2): qty 1

## 2018-06-30 MED ORDER — LORAZEPAM 2 MG/ML IJ SOLN
1.0000 mg | Freq: Four times a day (QID) | INTRAMUSCULAR | Status: DC | PRN
  Administered 2018-06-30 – 2018-07-01 (×2): 1 mg via INTRAVENOUS
  Filled 2018-06-30 (×3): qty 1

## 2018-06-30 MED ORDER — BUPRENORPHINE HCL-NALOXONE HCL 2-0.5 MG SL SUBL: 1.0000 | SUBLINGUAL_TABLET | SUBLINGUAL | Status: AC | PRN

## 2018-06-30 MED ORDER — FOLIC ACID 1 MG PO TABS
1.0000 mg | ORAL_TABLET | Freq: Every day | ORAL | Status: DC
  Filled 2018-06-30: qty 1

## 2018-06-30 MED ORDER — ADULT MULTIVITAMIN W/MINERALS CH
1.0000 | ORAL_TABLET | Freq: Every day | ORAL | Status: DC
  Filled 2018-06-30: qty 1

## 2018-06-30 NOTE — Progress Notes (Signed)
INFECTIOUS DISEASE PROGRESS NOTE  ID: Oscar Burke is a 37 y.o. male with  Active Problems:   Septic embolism (HCC)   Endocarditis  Subjective: N/v, tired.   Abtx:  Anti-infectives (From admission, onward)   Start     Dose/Rate Route Frequency Ordered Stop   06/29/18 1000  ceFAZolin (ANCEF) IVPB 2g/100 mL premix     2 g 200 mL/hr over 30 Minutes Intravenous Every 8 hours 06/29/18 0855        Medications:  Scheduled: . enoxaparin (LOVENOX) injection  40 mg Subcutaneous Q24H  . folic acid  1 mg Oral Daily  . multivitamin with minerals  1 tablet Oral Daily  . thiamine  100 mg Oral Daily   Or  . thiamine  100 mg Intravenous Daily    Objective: Vital signs in last 24 hours: Temp:  [99 F (37.2 C)-99.5 F (37.5 C)] 99.4 F (37.4 C) (10/24 0353) Pulse Rate:  [76-89] 76 (10/24 0353) Resp:  [16-20] 20 (10/24 0353) BP: (132-156)/(87-108) 155/96 (10/24 0430) SpO2:  [97 %] 97 % (10/24 0353)   General appearance: cooperative and no distress Resp: clear to auscultation bilaterally Cardio: regular rate and rhythm GI: normal findings: bowel sounds normal and soft, non-tender  Lab Results Recent Labs    06/29/18 0051  WBC 11.7*  HGB 7.0*  HCT 23.7*  NA 136  K 3.6  CL 102  CO2 28  BUN 16  CREATININE 1.39*   Liver Panel Recent Labs    06/29/18 0051  PROT 6.5  ALBUMIN 1.8*  AST 48*  ALT 36  ALKPHOS 99  BILITOT 0.5   Sedimentation Rate Recent Labs    06/29/18 0051  ESRSEDRATE 90*   C-Reactive Protein Recent Labs    06/29/18 0051  CRP 2.7*    Microbiology: Recent Results (from the past 240 hour(s))  Culture, blood (Routine X 2) w Reflex to ID Panel     Status: None (Preliminary result)   Collection Time: 06/29/18 12:52 AM  Result Value Ref Range Status   Specimen Description   Final    BLOOD RIGHT FOREARM Performed at [facility], [address], [place], [place] [address]    Special Requests   Final    BOTTLES DRAWN AEROBIC AND  ANAEROBIC Blood Culture adequate volume Performed at [facility], [address], [place], [place] [address]    Culture   Final    NO GROWTH 1 DAY Performed at [facility], [address] [place], [place] [address]    Report Status PENDING  Incomplete  Culture, blood (Routine X 2) w Reflex to ID Panel     Status: None (Preliminary result)   Collection Time: 06/29/18 12:52 AM  Result Value Ref Range Status   Specimen Description   Final    BLOOD LEFT ANTECUBITAL Performed at [facility], [address] [place], [place] [address]    Special Requests   Final    BOTTLES DRAWN AEROBIC AND ANAEROBIC Blood Culture adequate volume Performed at [facility], [address], [place], [place] [address]    Culture   Final    NO GROWTH 1 DAY Performed at [facility], [address], [place], [place] [address]    Report Status PENDING  Incomplete    Studies/Results: Dg Chest 2 View  Result Date: 06/29/2018 CLINICAL DATA:  Shortness of breath. Chest pain. Recent hospital admission for endocarditis, left AMA. EXAM: CHEST -  2 VIEW COMPARISON:  Radiograph 06/20/2018.  CT 06/16/2018 FINDINGS: Improving lung aeration with multifocal nodular opacities, many of these were necrotic on prior CT. Left upper lobe nodule is necrotic by radiograph. Decreased but residual small bilateral pleural effusions. Decreased cardiomegaly. Unchanged mediastinal contours. No pneumothorax. IMPRESSION: Radiographic improvement compared to prior exam with decreased pleural effusions and bibasilar opacities. Multiple pulmonary nodules consistent with septic emboli again seen, many of which are cavitary on prior CT. Electronically Signed   By: Narda Rutherford M.D.   On: 06/29/2018 01:37   Ct Angio Chest Pe W Or Wo Contrast  Result Date: 06/29/2018 CLINICAL DATA:  Shortness of breath. Recent hospital admission for endocarditis and septic emboli, left  AMA. Now feels worse. EXAM: CT ANGIOGRAPHY CHEST WITH CONTRAST TECHNIQUE: Multidetector CT imaging of the chest was performed using the standard protocol during bolus administration of intravenous contrast. Multiplanar CT image reconstructions and MIPs were obtained to evaluate the vascular anatomy. CONTRAST:  ISOVUE-370 IOPAMIDOL (ISOVUE-370) INJECTION 76% COMPARISON:  Radiographs earlier this day.  Chest CT 06/16/2018 FINDINGS: Cardiovascular: There are no filling defects within the pulmonary arteries to suggest pulmonary embolus. The thoracic aorta is normal in caliber without dissection. The heart is normal in size. No pericardial effusion. Mediastinum/Nodes: Persistent enlarged right supraclavicular node measuring 12 mm, previously 16 mm. Vague subcarinal adenopathy is again seen, not significantly changed. Indistinct bilateral hilar nodes are also not significantly changed. Esophagus is nondistended. Lungs/Pleura: Innumerable cavitary and non cavitary nodules throughout all lobes of both lungs consistent with septic emboli. Overall improvement in size from prior exam. Dominant right apical cavitary process measures 3.1 x 1.9 cm image 26 series 6, previously 3.5 x 4.1 cm. Bilateral pleural effusions, small to moderate. These have improved from prior exam. Mild pleural enhancement. No pulmonary edema. Trachea and mainstem bronchi are patent. Upper Abdomen: No acute finding. Musculoskeletal: The previous fluid collection tracking along the right subscapularis muscle is less well-defined, and likely improved, incompletely characterized on chest CT. There is whole body wall edema. No bony destructive change. Review of the MIP images confirms the above findings. IMPRESSION: 1. Persistent bilateral septic emboli, improved from 06/16/2018 CT. 2. Small to moderate bilateral pleural effusions, right greater than left, also improved. 3. Previous perimuscular fluid collection about the right shoulder is not as well  seen on the current exam. 4. No acute pulmonary embolus. Electronically Signed   By: Narda Rutherford M.D.   On: 06/29/2018 04:30     Assessment/Plan: MSSA bacteremia Tricuspid regurg TV IE Septic Pulm emboli IVDA (active)  Total days of antibiotics: 1 ancef  Await TEE Cr stable BNP elevated BCx are ngtd Restart suboxone         Johny Sax MD, FACP Infectious Diseases (pager) [phone] [email or web address] 06/30/2018, 11:17 AM  LOS: 1 day

## 2018-06-30 NOTE — Progress Notes (Signed)
PROGRESS NOTE    Oscar Burke  NFA:[id number] DOB: 02/26/1981 DOA: 06/28/2018 PCP: Patient, No Pcp Per    Brief Narrative:  37 year old male who presented with chest pain.  He does have significant past medical history for MSSA tricuspid valve endocarditis, he recently left the hospital AGAINST MEDICAL ADVICE, June 21, 2018, he did receive 2 and half weeks of IV antibiotic therapy.  Reported 2 to 3 days of worsening chest pain, associated with fevers and chills.  He was taken cephalexin at home with no improvement of his symptoms.  On his initial physical examination blood pressure 132/91, heart rate 81, respiratory rate 14, temperature 99.3, oxygen saturation 95%.  Moist mucous membranes, lungs clear to auscultation bilaterally, heart S1-S2 present rhythmic, abdomen soft nontender, positive lower extremity edema.  Sodium 136, potassium 3.6, chloride 102, bicarb 28, glucose 106, BUN 16, creatinine 1.39, white count 11.7, hemoglobin 7.0, hematocrit 23.7, platelets 384.  CT and radiograph of his chest with bilateral pulmonary nodules.  EKG with normal sinus rhythm, normal axis, normal intervals.  Shunt was admitted to the hospital with working diagnosis of MSSA tricuspid valve endocarditis, complicated by bilateral septic emboli.    Assessment & Plan:   Active Problems:   Septic embolism (HCC)   Endocarditis   1. 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 MSSA endocarditis, will continue antibiotic therapy with IV Ceftazodime. Follow on cell count and temperature curve. No clinical signs of heart failure, will follow on TEE per ID recommendations. Controlled chest pain, continue oxymetry monitoring and supplemental 02 per [place].   2. Substance abuse. Continue suboxone therapy. As needed lorazepam for withdrawal symptoms. Unsafe discharge due to IV drug use.   3. Anxiety. Continue alprazolam as needed.      DVT prophylaxis: enoxaparin   Code Status:  full Family  Communication: I spoke with patient's family at the bedside and all questions were addressed.  Disposition Plan/ discharge barriers: pending completion of IV antibiotic therapy. Unsafe discharge due to drug abuse.    Consultants:   ID  Procedures:     Antimicrobials:   Ceftazidime     Subjective: Patient's chest pain is improved with suboxone, no dyspnea, no nausea or vomiting.   Objective: Vitals:   06/30/18 0015 06/30/18 0353 06/30/18 0430 06/30/18 1139  BP: (!) 156/92 (!) 152/108 (!) 155/96   Pulse:  76    Resp:  20    Temp:  99.4 F (37.4 C)    TempSrc:  Oral    SpO2:  97%  92%  Weight:      Height:        Intake/Output Summary (Last 24 hours) at 06/30/2018 1258 Last data filed at 06/30/2018 4696 Gross per 24 hour  Intake 240 ml  Output 1575 ml  Net -1335 ml   Filed Weights   06/29/18 0652  Weight: 75.7 kg    Examination:   General: deconditioned  Neurology: Awake and alert, non focal  E ENT: no pallor, no icterus, oral mucosa moist Cardiovascular: No JVD. S1-S2 present, rhythmic, no gallops, or rubs, positive right sternal boder murmur, 3/6. No lower extremity edema. Pulmonary: positive breath sounds bilaterally, adequate air movement, no wheezing, rhonchi or rales. Gastrointestinal. Abdomen with no organomegaly, non tender, no rebound or guarding Skin. No rashes Musculoskeletal: no joint deformities     Data Reviewed: I have personally reviewed following labs and imaging studies  CBC: Recent Labs  Lab 06/29/18 0051  WBC 11.7*  NEUTROABS 8.1*  HGB 7.0*  HCT 23.7*  MCV 93.3  PLT 384   Basic Metabolic Panel: Recent Labs  Lab 06/29/18 0051  NA 136  K 3.6  CL 102  CO2 28  GLUCOSE 106*  BUN 16  CREATININE 1.39*  CALCIUM 7.8*   GFR: Estimated Creatinine Clearance: 76 mL/min (A) (by C-G formula based on SCr of 1.39 mg/dL (H)). Liver Function Tests: Recent Labs  Lab 06/29/18 0051  AST 48*  ALT 36  ALKPHOS 99  BILITOT 0.5    PROT 6.5  ALBUMIN 1.8*   No results for input(s): LIPASE, AMYLASE in the last 168 hours. No results for input(s): AMMONIA in the last 168 hours. Coagulation Profile: No results for input(s): INR, PROTIME in the last 168 hours. Cardiac Enzymes: No results for input(s): CKTOTAL, CKMB, CKMBINDEX, TROPONINI in the last 168 hours. BNP (last 3 results) No results for input(s): PROBNP in the last 8760 hours. HbA1C: No results for input(s): HGBA1C in the last 72 hours. CBG: No results for input(s): GLUCAP in the last 168 hours. Lipid Profile: No results for input(s): CHOL, HDL, LDLCALC, TRIG, CHOLHDL, LDLDIRECT in the last 72 hours. Thyroid Function Tests: No results for input(s): TSH, T4TOTAL, FREET4, T3FREE, THYROIDAB in the last 72 hours. Anemia Panel: No results for input(s): VITAMINB12, FOLATE, FERRITIN, TIBC, IRON, RETICCTPCT in the last 72 hours.    Radiology Studies: I have reviewed all of the imaging during this hospital visit personally     Scheduled Meds: . buprenorphine-naloxone  1 tablet Sublingual Daily  . enoxaparin (LOVENOX) injection  40 mg Subcutaneous Q24H  . folic acid  1 mg Oral Daily  . multivitamin with minerals  1 tablet Oral Daily  . thiamine  100 mg Oral Daily   Or  . thiamine  100 mg Intravenous Daily   Continuous Infusions: .  ceFAZolin (ANCEF) IV 2 g (06/30/18 0926)     LOS: 1 day        Oscar Burke Annett Gula, MD [facility] Pager [phone]

## 2018-07-01 LAB — COMPREHENSIVE METABOLIC PANEL
ALBUMIN: 1.8 g/dL — AB (ref 3.5–5.0)
ALK PHOS: 87 U/L (ref 38–126)
ALT: 26 U/L (ref 0–44)
ANION GAP: 7 (ref 5–15)
AST: 41 U/L (ref 15–41)
BILIRUBIN TOTAL: 0.3 mg/dL (ref 0.3–1.2)
BUN: 12 mg/dL (ref 6–20)
CALCIUM: 8.1 mg/dL — AB (ref 8.9–10.3)
CO2: 31 mmol/L (ref 22–32)
Chloride: 98 mmol/L (ref 98–111)
Creatinine, Ser: 0.93 mg/dL (ref 0.61–1.24)
GFR calc non Af Amer: >60 mL/min (ref >60)
GLUCOSE: 97 mg/dL (ref 70–99)
POTASSIUM: 3.6 mmol/L (ref 3.5–5.1)
SODIUM: 136 mmol/L (ref 135–145)
TOTAL PROTEIN: 6.1 g/dL — AB (ref 6.5–8.1)

## 2018-07-01 LAB — CBC
HEMATOCRIT: 25.8 % — AB (ref 39.0–52.0)
Hemoglobin: 7.8 g/dL — ABNORMAL LOW (ref 13.0–17.0)
MCH: 27.6 pg (ref 26.0–34.0)
MCHC: 30.2 g/dL (ref 30.0–36.0)
MCV: 91.2 fL (ref 80.0–100.0)
Platelets: 441 10*3/uL — ABNORMAL HIGH (ref 150–400)
RBC: 2.83 MIL/uL — ABNORMAL LOW (ref 4.22–5.81)
RDW: 14.1 % (ref 11.5–15.5)
WBC: 10.8 10*3/uL — AB (ref 4.0–10.5)
nRBC: 0 % (ref 0.0–0.2)

## 2018-07-01 MED ORDER — ORITAVANCIN DIPHOSPHATE 400 MG IV SOLR
1200.0000 mg | Freq: Once | INTRAVENOUS | Status: AC
  Administered 2018-07-01: 1200 mg via INTRAVENOUS
  Filled 2018-07-01: qty 120

## 2018-07-01 NOTE — Discharge Summary (Signed)
Physician Discharge Summary  Oscar Burke UJW:[id number] DOB: 1981/03/18 DOA: 06/28/2018  PCP: Patient, No Pcp Per  Admit date: 06/28/2018 Discharge date: 07/01/2018  Admitted From: Home Disposition:  Patient left the hospital against medical advice  Recommendations for Outpatient Follow-up and new medication changes:  1. Follow up with Primary in 7 days 2. Continue taking cephalexin 3. Patient received oritavacin before discharge. 4. He was advised about the risk of not continuing IV antibiotic therapy for endocarditis, including worsening infection, heart valve damage and death. He understands to consequences and has decided to leave the hospital.   Home Health: no   Equipment/Devices: no    Discharge Condition: stable  CODE STATUS: ful  Diet recommendation: Regular.   Brief/Interim Summary: 37 year old male who presented with chest pain.  He does have significant past medical history for MSSA tricuspid valve endocarditis, he recently left the hospital AGAINST MEDICAL ADVICE, June 21, 2018, he did receive 2 and half weeks of IV antibiotic therapy.  Reported 2 to 3 days of worsening chest pain, associated with fevers and chills.  He was takening cephalexin at home with no improvement of his symptoms.  On his initial physical examination blood pressure 132/91, heart rate 81, respiratory rate 14, temperature 99.3, oxygen saturation 95%.  Moist mucous membranes, lungs clear to auscultation bilaterally, heart S1-S2 present rhythmic, abdomen soft nontender, positive lower extremity edema.  Sodium 136, potassium 3.6, chloride 102, bicarb 28, glucose 106, BUN 16, creatinine 1.39, white count 11.7, hemoglobin 7.0, hematocrit 23.7, platelets 384.  CT and radiograph of his chest with bilateral pulmonary nodules.  EKG with normal sinus rhythm, normal axis, normal intervals.  Patient was admitted to the hospital with the working diagnosis of MSSA tricuspid valve endocarditis, complicated by  bilateral septic emboli, complicated by suspected acute right heart failure.   1.  Tricuspid valve endocarditis with bilateral pulmonary septic emboli.  Patient was admitted to the hospital, he was placed on remote telemetry monitor.  He received IV antibiotic therapy with cephalexin, his symptoms improved, did not required diuretics. Acute heart failure was ruled out. Patient received IV Oritavancin before leaving the hospital.   2.  Substance abuse.  He was placed on Suboxone and as needed lorazepam within improvement of withdrawal symptoms.  Social services were consulted to arrange outpatient Suboxone clinic follow-up.  3.  Anxiety.  Continue alprazolam.  Patient was determined to be unsafe discharge due to IV drug use and risk of worsening infection.  He was advised to stay in the hospital to receive complete IV antibiotic therapy as the best treatment option.  He understands the consequences of not treating his infection with IV antibiotics including heart failure, valve damage and death.  I offered him help in order to stay in the hospital but he has decided to leave AGAINST MEDICAL ADVICE  Discharge Diagnoses:  Active Problems:   Septic embolism (HCC)   Endocarditis    Discharge Instructions   Allergies as of 07/01/2018   No Known Allergies     Medication List    STOP taking these medications   loperamide 2 MG capsule Commonly known as:  IMODIUM   meloxicam 7.5 MG tablet Commonly known as:  MOBIC   ondansetron 4 MG tablet Commonly known as:  ZOFRAN   risperiDONE 2 MG tablet Commonly known as:  RISPERDAL   traMADol 50 MG tablet Commonly known as:  ULTRAM     TAKE these medications   cephALEXin 500 MG capsule Commonly known as:  KEFLEX Take  1 capsule (500 mg total) by mouth 3 (three) times daily.   Fish Oil 1000 MG Cpdr Take 1,000 mg by mouth daily.   vitamin B-12 1000 MCG tablet Commonly known as:  CYANOCOBALAMIN Take 1,000 mcg by mouth daily.       No  Known Allergies  Consultations:  ID   Procedures/Studies: Dg Orthopantogram  Result Date: 06/15/2018 CLINICAL DATA:  Patient reports he has had blood clots in his heart breaking up. Patient denies any jaw pains. Prior injury with 3 fractures to his mandible. Claims to have 3 plates and 13 screws in his mandible. Patient reports no known tooth infections, but has had multiple broken teeth. EXAM: ORTHOPANTOGRAM/PANORAMIC COMPARISON:  None. FINDINGS: No acute fracture. There are fixation plates along the posterior right mandibular body and angle and anterior left mandibular body extending to the left symphysis. Orthopedic hardware appears well seated. No bone lesion. There is a small area periapical lucency at the base of the left mandibular lateral incisor. There are missing maxillary teeth, 1 and 16, and missing mandibular teeth, 17, 18, 19, 30 and 32. IMPRESSION: 1. No acute fracture.  No bone lesion. 2. Small area periapical lucency at the base of the left mandibular lateral incisor. Consider a root abscess if there are consistent clinical findings. 3. Multiple chronically missing teeth as described. 4. Old fractures treated with prior ORIF. Electronically Signed   By: Amie Portland M.D.   On: 06/15/2018 15:18   Dg Chest 2 View  Result Date: 06/29/2018 CLINICAL DATA:  Shortness of breath. Chest pain. Recent hospital admission for endocarditis, left AMA. EXAM: CHEST - 2 VIEW COMPARISON:  Radiograph 06/20/2018.  CT 06/16/2018 FINDINGS: Improving lung aeration with multifocal nodular opacities, many of these were necrotic on prior CT. Left upper lobe nodule is necrotic by radiograph. Decreased but residual small bilateral pleural effusions. Decreased cardiomegaly. Unchanged mediastinal contours. No pneumothorax. IMPRESSION: Radiographic improvement compared to prior exam with decreased pleural effusions and bibasilar opacities. Multiple pulmonary nodules consistent with septic emboli again seen, many of  which are cavitary on prior CT. Electronically Signed   By: Narda Rutherford M.D.   On: 06/29/2018 01:37   Dg Chest 2 View  Result Date: 06/08/2018 CLINICAL DATA:  Upper abdominal pain and tightness, poor appetite, chest pain with shortness of breath EXAM: CHEST - 2 VIEW COMPARISON:  Chest x-ray of 06/02/2018 and 11/19/2016 FINDINGS: There is now increased opacity at the left lung and mid left lung as well as the medial left apex suspicious for patchy pneumonia. There may be minimal involvement of the right lung base as well and follow-up is recommended. There is a small left pleural effusion present. Some fluid tracks into the major fissure on the lateral view. Mediastinal and hilar contours are unremarkable and the heart is minimally enlarged. No bony abnormality is seen. IMPRESSION: 1. Interval development of patchy opacities particularly at the left lung base, left mid lung and left lung apex with perhaps mild involvement of the right lung base, suspicious for multifocal pneumonia. Recommend follow-up chest x-ray. 2. Small left pleural effusion. Electronically Signed   By: Dwyane Dee M.D.   On: 06/08/2018 08:37   Dg Chest 2 View  Result Date: 06/02/2018 CLINICAL DATA:  37 year old male with cough and fever. EXAM: CHEST - 2 VIEW COMPARISON:  Chest radiograph dated 11/19/2017 FINDINGS: The heart size and mediastinal contours are within normal limits. Both lungs are clear. The visualized skeletal structures are unremarkable. IMPRESSION: No active cardiopulmonary disease. Electronically  Signed   By: Elgie Collard M.D.   On: 06/02/2018 06:04   Ct Chest W Contrast  Result Date: 06/17/2018 CLINICAL DATA:  Right chest pain and shortness of breath. IV drug abuse. Septic emboli. EXAM: CT CHEST WITH CONTRAST TECHNIQUE: Multidetector CT imaging of the chest was performed during intravenous contrast administration. CONTRAST:  75mL OMNIPAQUE IOHEXOL 300 MG/ML  SOLN COMPARISON:  Thoracic spine CT from  06/15/2018 FINDINGS: Cardiovascular: Mild cardiomegaly. Mediastinum/Nodes: Supraclavicular adenopathy including a 1.6 cm right supraclavicular node on image 19/3. Indistinct subcarinal adenopathy approximately 1.4 cm in diameter. Indistinct hilar adenopathy with a right hilar node at about 1.5 cm in short axis on image 71/3. Lungs/Pleura: Numerous cavitary nodules are present throughout both lungs. Many of these are confluent with other cavitary nodules. Some of the nodules are not overtly cavitary. Index confluence of cavitary nodules at the right lung apex measures 4.1 by 3.5 cm. Moderate-sized bilateral pleural effusions are present. There is likely some mild pleural enhancement along with some pleural loculation more notably on the left, exudative effusions or empyema not excluded. Upper Abdomen: Unremarkable Musculoskeletal: On recent right shoulder MRI, there was an abnormal fluid collection tracking along the right subscapularis muscle laterally. This is partially seen on image 39/3, and may measure up to about 8.8 by 3.0 by 4.3 cm. This may well be an abscess within or along the lateral right subscapularis muscle. IMPRESSION: 1. Numerous cavitary nodules in both lungs favoring septic emboli in this setting. Bilateral pleural effusions are likely exudative and empyema is not excluded. 2. Abnormal low-density process involving the lateral portion of the right subscapularis muscle suspicious for abscess. 3. Reactive adenopathy including a 1.6 cm right supraclavicular lymph node. 4. Mild cardiomegaly. Electronically Signed   By: Gaylyn Rong M.D.   On: 06/17/2018 01:42   Ct Angio Chest Pe W Or Wo Contrast  Result Date: 06/29/2018 CLINICAL DATA:  Shortness of breath. Recent hospital admission for endocarditis and septic emboli, left AMA. Now feels worse. EXAM: CT ANGIOGRAPHY CHEST WITH CONTRAST TECHNIQUE: Multidetector CT imaging of the chest was performed using the standard protocol during bolus  administration of intravenous contrast. Multiplanar CT image reconstructions and MIPs were obtained to evaluate the vascular anatomy. CONTRAST:  ISOVUE-370 IOPAMIDOL (ISOVUE-370) INJECTION 76% COMPARISON:  Radiographs earlier this day.  Chest CT 06/16/2018 FINDINGS: Cardiovascular: There are no filling defects within the pulmonary arteries to suggest pulmonary embolus. The thoracic aorta is normal in caliber without dissection. The heart is normal in size. No pericardial effusion. Mediastinum/Nodes: Persistent enlarged right supraclavicular node measuring 12 mm, previously 16 mm. Vague subcarinal adenopathy is again seen, not significantly changed. Indistinct bilateral hilar nodes are also not significantly changed. Esophagus is nondistended. Lungs/Pleura: Innumerable cavitary and non cavitary nodules throughout all lobes of both lungs consistent with septic emboli. Overall improvement in size from prior exam. Dominant right apical cavitary process measures 3.1 x 1.9 cm image 26 series 6, previously 3.5 x 4.1 cm. Bilateral pleural effusions, small to moderate. These have improved from prior exam. Mild pleural enhancement. No pulmonary edema. Trachea and mainstem bronchi are patent. Upper Abdomen: No acute finding. Musculoskeletal: The previous fluid collection tracking along the right subscapularis muscle is less well-defined, and likely improved, incompletely characterized on chest CT. There is whole body wall edema. No bony destructive change. Review of the MIP images confirms the above findings. IMPRESSION: 1. Persistent bilateral septic emboli, improved from 06/16/2018 CT. 2. Small to moderate bilateral pleural effusions, right greater than left,  also improved. 3. Previous perimuscular fluid collection about the right shoulder is not as well seen on the current exam. 4. No acute pulmonary embolus. Electronically Signed   By: Narda Rutherford M.D.   On: 06/29/2018 04:30   Mr Thoracic Spine Wo  Contrast  Result Date: 06/15/2018 CLINICAL DATA:  Back pain with concern for epidural abscess. IV drug use. EXAM: MRI THORACIC AND LUMBAR SPINE WITHOUT CONTRAST TECHNIQUE: Multiplanar and multiecho pulse sequences of the thoracic and lumbar spine were obtained without intravenous contrast. COMPARISON:  CT abdomen pelvis 06/08/2018 FINDINGS: The study is degraded by motion, despite efforts to reduce this artifact. The findings of the study are interpreted in the context of reduced sensitivity/specificity. MRI THORACIC SPINE FINDINGS Alignment:  Physiologic. Vertebrae: No fracture, evidence of discitis, or bone lesion. Cord:  Normal signal and morphology. Paraspinal and other soft tissues: Multiple areas of consolidation within both lungs. Disc levels: No disc herniation or spinal canal stenosis. MRI LUMBAR SPINE FINDINGS Segmentation:  Standard. Alignment:  Physiologic. Vertebrae:  No fracture, evidence of discitis, or bone lesion. Conus medullaris and cauda equina: Conus extends to the L1 level. Conus and cauda equina appear normal. Paraspinal and other soft tissues: Negative Disc levels: The L1-2 and L2-3 disc levels are normal. L3-L4: Mild facet hypertrophy without stenosis. L4-L5: Diffuse disc bulge, left eccentric, with mild bilateral facet hypertrophy. There is narrowing of the left lateral recess and moderate left neural foraminal stenosis. L5-S1: No disc herniation or stenosis. IMPRESSION: MR THORACIC SPINE IMPRESSION 1. No discitis-osteomyelitis or epidural abscess. 2. Multifocal cavitary consolidation within both lungs. MR LUMBAR SPINE IMPRESSION 1. No discitis-osteomyelitis or epidural abscess. 2. Lower lumbar degenerative disc disease, worst at L4-L5 where there is narrowing of the left lateral recess and moderate left neural foraminal stenosis. Electronically Signed   By: Deatra Shidler M.D.   On: 06/15/2018 21:51   Mr Lumbar Spine Wo Contrast  Result Date: 06/15/2018 CLINICAL DATA:  Back pain with  concern for epidural abscess. IV drug use. EXAM: MRI THORACIC AND LUMBAR SPINE WITHOUT CONTRAST TECHNIQUE: Multiplanar and multiecho pulse sequences of the thoracic and lumbar spine were obtained without intravenous contrast. COMPARISON:  CT abdomen pelvis 06/08/2018 FINDINGS: The study is degraded by motion, despite efforts to reduce this artifact. The findings of the study are interpreted in the context of reduced sensitivity/specificity. MRI THORACIC SPINE FINDINGS Alignment:  Physiologic. Vertebrae: No fracture, evidence of discitis, or bone lesion. Cord:  Normal signal and morphology. Paraspinal and other soft tissues: Multiple areas of consolidation within both lungs. Disc levels: No disc herniation or spinal canal stenosis. MRI LUMBAR SPINE FINDINGS Segmentation:  Standard. Alignment:  Physiologic. Vertebrae:  No fracture, evidence of discitis, or bone lesion. Conus medullaris and cauda equina: Conus extends to the L1 level. Conus and cauda equina appear normal. Paraspinal and other soft tissues: Negative Disc levels: The L1-2 and L2-3 disc levels are normal. L3-L4: Mild facet hypertrophy without stenosis. L4-L5: Diffuse disc bulge, left eccentric, with mild bilateral facet hypertrophy. There is narrowing of the left lateral recess and moderate left neural foraminal stenosis. L5-S1: No disc herniation or stenosis. IMPRESSION: MR THORACIC SPINE IMPRESSION 1. No discitis-osteomyelitis or epidural abscess. 2. Multifocal cavitary consolidation within both lungs. MR LUMBAR SPINE IMPRESSION 1. No discitis-osteomyelitis or epidural abscess. 2. Lower lumbar degenerative disc disease, worst at L4-L5 where there is narrowing of the left lateral recess and moderate left neural foraminal stenosis. Electronically Signed   By: Deatra Devos M.D.   On: 06/15/2018  21:51   Ct Abdomen Pelvis W Contrast  Result Date: 06/19/2018 CLINICAL DATA:  Right upper quadrant abdominal pain and distention. Septic pulmonary emboli from  endocarditis. Chronic hepatitis-C and IV drug abuse. EXAM: CT ABDOMEN AND PELVIS WITH CONTRAST TECHNIQUE: Multidetector CT imaging of the abdomen and pelvis was performed using the standard protocol following bolus administration of intravenous contrast. CONTRAST:  OMNIPAQUE IOHEXOL 300 MG/ML  SOLN COMPARISON:  06/08/2018 FINDINGS: Lower Chest: Small bilateral pleural effusions and cavitary pulmonary nodules in both lung bases again seen. Hepatobiliary: No hepatic masses identified. 1 cm cyst again seen within the anterior right hepatic lobe. Gallbladder is unremarkable. Pancreas:  No mass or inflammatory changes. Spleen: Within normal limits in size and appearance. Adrenals/Urinary Tract: No masses identified. No evidence of hydronephrosis. Unremarkable unopacified urinary bladder. Stomach/Bowel: No evidence of obstruction, inflammatory process or abnormal fluid collections. Normal appendix visualized. Vascular/Lymphatic: No pathologically enlarged lymph nodes. No abdominal aortic aneurysm. Reproductive:  No mass or other significant abnormality. Other: New mild ascites and diffuse mesenteric and body wall edema since prior exam. Musculoskeletal:  No suspicious bone lesions identified. IMPRESSION: New mild ascites and diffuse mesenteric and body wall edema, consistent with 3rd spacing. No focal inflammatory process or abscess within the abdomen or pelvis. No significant change in small bilateral pleural effusions, and cavitary lung nodules consistent with known septic emboli. Electronically Signed   By: Myles Rosenthal M.D.   On: 06/19/2018 19:45   Ct Abdomen Pelvis W Contrast  Result Date: 06/08/2018 CLINICAL DATA:  Epigastric pain. Right lower quadrant pain and tenderness. Abdominal pain, appendicitis suspected. Acute pain, generalized. EXAM: CT ABDOMEN AND PELVIS WITH CONTRAST TECHNIQUE: Multidetector CT imaging of the abdomen and pelvis was performed using the standard protocol following bolus  administration of intravenous contrast. CONTRAST:  ISOVUE-300 IOPAMIDOL (ISOVUE-300) INJECTION 61% COMPARISON:  One-view abdomen 12/13/2013 FINDINGS: Lower chest: Multiple poorly marginated cavitary pulmonary nodules are present in the lower lobes bilaterally. Left greater than right pleural effusion is present with associated atelectasis. There is no pneumothorax or greater consolidation. The heart size is normal. Hepatobiliary: A 9 mm simple cyst is present in the anterior right lobe of the liver. No other focal lesions are present. There is no ductal dilation. The gallbladder is normal. Pancreas: Unremarkable. No pancreatic ductal dilatation or surrounding inflammatory changes. Spleen: Mild splenomegaly is noted. No discrete lesions are evident. Adrenals/Urinary Tract: Adrenal glands are normal bilaterally. Kidneys and ureters are within normal limits. Gallbladder is normal. Stomach/Bowel: Stomach and duodenum are within normal limits. Small bowel is unremarkable. Terminal ileum is visualized and within normal limits. The appendix is visualized and normal. Ascending transverse colon are normal. Descending and sigmoid colon are within normal limits. Vascular/Lymphatic: No significant vascular findings are present. No enlarged abdominal or pelvic lymph nodes. Reproductive: Prostate is unremarkable. Other: No abdominal wall hernia or abnormality. No abdominopelvic ascites. Musculoskeletal: Bilateral L2 pars defects are present. Alignment is anatomic. Vertebral body heights are normal. No focal lytic or blastic lesions are present. Pelvis is intact. Hips are located and within normal limits bilaterally. IMPRESSION: 1. Multiple poorly marginated cavitary pulmonary nodules in the lower lobes bilaterally. Given the history of polysubstance abuse, this most likely reflects septic emboli. Other atypical infection or malignancy is considered much less likely. 2. No acute or focal abnormality in the abdomen to explain  abdominal pain. 3. Mild splenomegaly without focal lesion. Electronically Signed   By: Marin Roberts M.D.   On: 06/08/2018 10:11   Mr Shoulder  Right Wo Contrast  Result Date: 06/15/2018 CLINICAL DATA:  Septic emboli, possible septic arthritis of the shoulder. IV drug abuse. EXAM: MRI OF THE RIGHT SHOULDER WITHOUT CONTRAST TECHNIQUE: Multiplanar, multisequence MR imaging of the shoulder was performed. No intravenous contrast was administered. COMPARISON:  Known FINDINGS: Despite efforts by the technologist and patient, motion artifact is present on today's exam and could not be eliminated. This reduces exam sensitivity and specificity. Rotator cuff:  Unremarkable Muscles: We partially image a large septated fluid collection anterior to the scapula partially surrounding and deep to the subscapularis muscle as on image 20/9. This is probably separate from the adjacent subcoracoid bursitis and distended subscapular recess. I am suspicious that this is an abscess within or along the subscapularis muscle. Biceps long head:  Unremarkable Acromioclavicular Joint: No arthropathy. Type II acromion. There is only trace fluid in the subacromial subdeltoid bursa. Glenohumeral Joint: Moderate glenohumeral joint effusion. This mildly distends the axillary pouch. Labrum:  Indeterminate due to motion artifact. Bones:  No osseous edema to suggest osteomyelitis. Other: Low-level edema signal between the supraspinatus and trapezius muscles on image 11/6. IMPRESSION: 1. Large likely septated fluid collection along the margins of the subscapularis muscle, primarily between the subscapularis and the scapula, favoring abscess. This is only partially included on today's exam. 2. There is an adjacent joint effusion. Septic joint is likely although this could conceivably be reactive. An anterior needle approach to the joint with probably extend through the abscess for through some of the surrounding phlegmon. 3. Low-level  infiltrative edema between the supraspinatus and trapezius muscles. Electronically Signed   By: Gaylyn Rong M.D.   On: 06/15/2018 23:07   Mr Foot Right Wo Contrast  Result Date: 06/17/2018 CLINICAL DATA:  Draining wound. Evaluate for osteomyelitis. Wound is along the plantar aspect of the foot. EXAM: MRI OF THE RIGHT FOREFOOT WITHOUT CONTRAST TECHNIQUE: Multiplanar, multisequence MR imaging of the right foot was performed. No intravenous contrast was administered. COMPARISON:  None. FINDINGS: Bones/Joint/Cartilage No marrow signal abnormality. No fracture or dislocation. Normal alignment. No joint effusion. No periosteal reaction or bone destruction. No aggressive osseous lesion. Ligaments Collateral ligaments are intact.  Lisfranc ligament is intact. Muscles and Tendons Flexor, peroneal and extensor compartment tendons are intact. Muscles are normal. Soft tissue Soft tissue wound along the plantar aspect of the midfoot. No adjacent fluid collection or hematoma. Mild soft tissue edema along the forefoot which may be secondary to cellulitis versus reactive edema secondary to venous stasis or fluid overload. No soft tissue mass. IMPRESSION: 1. No osteomyelitis of the right forefoot. 2. Soft tissue wound along the plantar aspect of the midfoot. No fluid collection to suggest an abscess or hematoma. 3. Mild soft tissue edema along the forefoot which may be secondary to cellulitis versus reactive edema secondary to venous stasis or fluid overload. Electronically Signed   By: Elige Ko   On: 06/17/2018 08:21   Dg Chest Port 1 View  Result Date: 06/20/2018 CLINICAL DATA:  Hypoxia.  Shortness of breath for the past week. EXAM: PORTABLE CHEST 1 VIEW COMPARISON:  06/19/2018; 06/08/2018; chest CT-06/16/2018 FINDINGS: Grossly unchanged enlarged cardiac silhouette and mediastinal contours with thickening the right paratracheal stripe. Suspected slightly increased conspicuity of bilateral lower lobe  predominant nodular airspace opacities. Interval increase in size of small right-sided effusion with associated worsening right basilar heterogeneous/consolidative opacities. No pneumothorax. No evidence of edema. No acute osseus abnormalities. IMPRESSION: 1. Suspected slight worsening of extensive bilateral nodular airspace opacities worrisome for septic emboli.  2. Interval increase in size of small right-sided effusion. Electronically Signed   By: Simonne Come M.D.   On: 06/20/2018 08:09   Dg Chest Port 1 View  Result Date: 06/19/2018 Delaine Lame, MD     06/19/2018  4:27 PM INDICATION: Pleural effusion, rule out empyema PROCEDURE OPERATOR: Shrey Purohit ATTENDING PHYSICIAN: Shrey Purohit  CONSENT: Consent was obtained from patient prior to the procedure. Indications, risks, and benefits were explained at length.  PROCEDURE SUMMARY: A time out was performed and the chest x-ray was reviewed, the appropriate side was confirmed with ultrasound guidance and marked. My hands were washed immediately prior to the procedure. I wore a surgical cap, mask and sterile gloves throughout the procedure. The patient was prepped and draped in a sterile manner using chlorhexidine scrub after the appropriate level was percussed and confirmed by ultrasound. 1% lidocaine was used to anesthesize the skin, subcutaneous tissue, superior aspect of the rib periosteum and parietal pleura. A finder needle was then introduced over the superior aspect of the rib to locate the pleural fluid; serosanguineous cloudy colored fluid was aspirated. A 10-blade scalpel was used to nick the skin at the insertion site. The Safe-t-Centesis needle was then introduced through the skin incision into the pleural space using negative aspiration pressure and the red colometric indicator to confirm appropriate positioning of the needle. The thoracentesis catheter was then threaded without difficulty.  550 ml of cloudy serosanguineous colored fluid was  removed without difficulty. The catheter was then removed. No immediate complications were noted during the procedure. A post-procedure chest x-ray is pending at the time of this note. The fluid will be sent for studies.  Estimated blood loss is minimal.  Dg Abd 2 Views  Result Date: 06/19/2018 CLINICAL DATA:  Abdominal pain EXAM: ABDOMEN - 2 VIEW COMPARISON:  Chest CT dated 06/16/2018. FINDINGS: Moderate amount of gas and stool throughout the colon, with associated mild distention of the LEFT colon. No evidence of small bowel obstruction. No evidence of soft tissue mass or abnormal fluid collection. No evidence of free intraperitoneal air. No evidence of renal or ureteral calculi. No acute or suspicious osseous finding. RIGHT pleural effusion, as also seen on the recent chest CT. Streaky opacities at the LEFT lung base correspond to the presumed septic emboli and associated consolidations described on recent chest CT. IMPRESSION: Overall nonobstructive bowel gas pattern. Moderate amount of gas and stool throughout the colon, with associated mild distention of the LEFT colon. Electronically Signed   By: Bary Richard M.D.   On: 06/19/2018 13:01   Dg Finger Index Left  Result Date: 06/08/2018 CLINICAL DATA:  Pain MCP joint of LEFT index finger. No known injury. EXAM: LEFT INDEX FINGER 2+V COMPARISON:  None. FINDINGS: There is no evidence of fracture or dislocation. There is no evidence of arthropathy or other focal bone abnormality. Soft tissues are unremarkable. IMPRESSION: Negative. Electronically Signed   By: Bary Richard M.D.   On: 06/08/2018 14:45   Dg Fluoro Guided Needle Plc Aspiration/injection Loc  Result Date: 06/16/2018 CLINICAL DATA:  Right shoulder effusion with clinical suspicion of septic joint. EXAM: RIGHT SHOULDER ASPIRATION UNDER FLUOROSCOPY TECHNIQUE: An appropriate skin entrance site was determined. The site was marked, prepped with Betadine, draped in the usual sterile fashion, and  infiltrated locally with buffered Lidocaine. 22 gauge spinal needle was advanced to the medial margin of the humeral head under intermittent fluoroscopy. 1 ml of Lidocaine injected easily. A mixture of 2 cc Isovue and saline  was then used to opacify the right shoulder capsule. Aspiration was then performed yielding approximately 1 cc of serosanguineous fluid, which was sent to microbiology. No immediate complication. FLUOROSCOPY TIME:  Fluoroscopy Time:  18 seconds. Number of Acquired Spot Images: 1 FINDINGS: Normal alignment of the glenohumeral joint. No osseous erosive lesions noted. A mixture of Isovue/saline was easily injected into the right glenohumeral joint. Scant amount of serosanguineous fluid was aspirated and sent to microbiology. IMPRESSION: Technically successful right shoulder diagnostic aspiration. Electronically Signed   By: Ted Mcalpine M.D.   On: 06/16/2018 11:38       Subjective: Patient is feeling better, his edema has improved, no chest pain or dyspnea, no nausea or vomiting.   Discharge Exam: Vitals:   07/01/18 0850 07/01/18 1244  BP:  (!) 126/92  Pulse:  87  Resp:  20  Temp:  98.5 F (36.9 C)  SpO2: 91% 97%   Vitals:   07/01/18 0052 07/01/18 0530 07/01/18 0850 07/01/18 1244  BP: 116/77 (!) 133/95  (!) 126/92  Pulse: 78 78  87  Resp: 20 20  20   Temp: 98.5 F (36.9 C) 98.4 F (36.9 C)  98.5 F (36.9 C)  TempSrc: Oral Oral  Oral  SpO2: 96% 95% 91% 97%  Weight:      Height:        General: Not in pain or dyspnea, Neurology: Awake and alert, non focal  E ENT: no pallor, no icterus, oral mucosa moist Cardiovascular: No JVD. S1-S2 present, rhythmic, no gallops, rubs, or murmurs. Trace lower extremity edema. Pulmonary: vesicular breath sounds bilaterally, adequate air movement, no wheezing, rhonchi or rales. Gastrointestinal. Abdomen flat, no organomegaly, non tender, no rebound or guarding Skin. No rashes Musculoskeletal: no joint deformities   The  results of significant diagnostics from this hospitalization (including imaging, microbiology, ancillary and laboratory) are listed below for reference.     Microbiology: Recent Results (from the past 240 hour(s))  Culture, blood (Routine X 2) w Reflex to ID Panel     Status: None (Preliminary result)   Collection Time: 06/29/18 12:52 AM  Result Value Ref Range Status   Specimen Description   Final    BLOOD RIGHT FOREARM Performed at [facility], [address], [place], [place] [address]    Special Requests   Final    BOTTLES DRAWN AEROBIC AND ANAEROBIC Blood Culture adequate volume Performed at [facility], [address], [place], [place] [address]    Culture   Final    NO GROWTH 2 DAYS Performed at [facility], [address], [place], [place] [address]    Report Status PENDING  Incomplete  Culture, blood (Routine X 2) w Reflex to ID Panel     Status: None (Preliminary result)   Collection Time: 06/29/18 12:52 AM  Result Value Ref Range Status   Specimen Description   Final    BLOOD LEFT ANTECUBITAL Performed at [facility], [address] [place], [place] [address]    Special Requests   Final    BOTTLES DRAWN AEROBIC AND ANAEROBIC Blood Culture adequate volume Performed at [facility], [address], [place], [place] [address]    Culture   Final    NO GROWTH 2 DAYS Performed at [facility], [address], [place], [place] [address]    Report Status PENDING  Incomplete     Labs: BNP (last 3 results) Recent Labs  06/29/18 0051  BNP 477.0*   Basic Metabolic Panel: Recent Labs  Lab 06/29/18 0051 07/01/18 0524  NA 136 136  K 3.6 3.6  CL 102 98  CO2 28 31  GLUCOSE 106* 97  BUN 16 12  CREATININE 1.39* 0.93  CALCIUM 7.8* 8.1*   Liver Function Tests: Recent Labs  Lab 06/29/18 0051 07/01/18 0524  AST 48* 41  ALT 36 26  ALKPHOS 99 87  BILITOT 0.5 0.3  PROT 6.5 6.1*   ALBUMIN 1.8* 1.8*   No results for input(s): LIPASE, AMYLASE in the last 168 hours. No results for input(s): AMMONIA in the last 168 hours. CBC: Recent Labs  Lab 06/29/18 0051 07/01/18 0524  WBC 11.7* 10.8*  NEUTROABS 8.1*  --   HGB 7.0* 7.8*  HCT 23.7* 25.8*  MCV 93.3 91.2  PLT 384 441*   Cardiac Enzymes: No results for input(s): CKTOTAL, CKMB, CKMBINDEX, TROPONINI in the last 168 hours. BNP: Invalid input(s): POCBNP CBG: No results for input(s): GLUCAP in the last 168 hours. D-Dimer No results for input(s): DDIMER in the last 72 hours. Hgb A1c No results for input(s): HGBA1C in the last 72 hours. Lipid Profile No results for input(s): CHOL, HDL, LDLCALC, TRIG, CHOLHDL, LDLDIRECT in the last 72 hours. Thyroid function studies No results for input(s): TSH, T4TOTAL, T3FREE, THYROIDAB in the last 72 hours.  Invalid input(s): FREET3 Anemia work up No results for input(s): VITAMINB12, FOLATE, FERRITIN, TIBC, IRON, RETICCTPCT in the last 72 hours. Urinalysis    Component Value Date/Time   COLORURINE AMBER (A) 06/08/2018 0811   APPEARANCEUR HAZY (A) 06/08/2018 0811   LABSPEC 1.017 06/08/2018 0811   PHURINE 5.0 06/08/2018 0811   GLUCOSEU NEGATIVE 06/08/2018 0811   HGBUR MODERATE (A) 06/08/2018 0811   BILIRUBINUR NEGATIVE 06/08/2018 0811   KETONESUR NEGATIVE 06/08/2018 0811   PROTEINUR 30 (A) 06/08/2018 0811   UROBILINOGEN 2.0 (H) 12/17/2013 0848   NITRITE NEGATIVE 06/08/2018 0811   LEUKOCYTESUR NEGATIVE 06/08/2018 0811   Sepsis Labs Invalid input(s): PROCALCITONIN,  WBC,  LACTICIDVEN Microbiology Recent Results (from the past 240 hour(s))  Culture, blood (Routine X 2) w Reflex to ID Panel     Status: None (Preliminary result)   Collection Time: 06/29/18 12:52 AM  Result Value Ref Range Status   Specimen Description   Final    BLOOD RIGHT FOREARM Performed at [facility], [address], [place], [place] [address]    Special Requests   Final    BOTTLES  DRAWN AEROBIC AND ANAEROBIC Blood Culture adequate volume Performed at [facility], [address], [place], [place] [address]    Culture   Final    NO GROWTH 2 DAYS Performed at [facility], [address], [place], [place] [address]    Report Status PENDING  Incomplete  Culture, blood (Routine X 2) w Reflex to ID Panel     Status: None (Preliminary result)   Collection Time: 06/29/18 12:52 AM  Result Value Ref Range Status   Specimen Description   Final    BLOOD LEFT ANTECUBITAL Performed at [facility], [address], [place], [place] [address]    Special Requests   Final    BOTTLES DRAWN AEROBIC AND ANAEROBIC Blood Culture adequate volume Performed at [facility], [address], [place], [place] [address]    Culture   Final    NO GROWTH 2 DAYS Performed at [facility], [address], [place], [place] [address]    Report Status PENDING  Incomplete     Time coordinating discharge: 45 minutes  SIGNED:   Coralie Keens, MD  [facility] 07/01/2018, 1:44 PM Pager [phone]  If 7PM-7AM, please contact night-coverage [email or web address] Password [email or web address]

## 2018-07-01 NOTE — Progress Notes (Signed)
INFECTIOUS DISEASE PROGRESS NOTE  ID: Oscar Burke is a 37 y.o. male with  Active Problems:   Septic embolism (HCC)   Endocarditis  Subjective: Wants to leave  Abtx:  Anti-infectives (From admission, onward)   Start     Dose/Rate Route Frequency Ordered Stop   06/29/18 1000  ceFAZolin (ANCEF) IVPB 2g/100 mL premix     2 g 200 mL/hr over 30 Minutes Intravenous Every 8 hours 06/29/18 0855        Medications:  Scheduled: . buprenorphine-naloxone  1 tablet Sublingual Daily  . enoxaparin (LOVENOX) injection  40 mg Subcutaneous Q24H  . folic acid  1 mg Oral Daily  . multivitamin with minerals  1 tablet Oral Daily  . thiamine  100 mg Oral Daily   Or  . thiamine  100 mg Intravenous Daily    Objective: Vital signs in last 24 hours: Temp:  [98.2 F (36.8 C)-98.7 F (37.1 C)] 98.4 F (36.9 C) (10/25 0530) Pulse Rate:  [64-78] 78 (10/25 0530) Resp:  [16-20] 20 (10/25 0530) BP: (116-158)/(77-101) 133/95 (10/25 0530) SpO2:  [91 %-100 %] 91 % (10/25 0850)   General appearance: alert, cooperative and no distress Resp: clear to auscultation bilaterally Cardio: regular rate and rhythm GI: normal findings: bowel sounds normal and soft, non-tender  Lab Results Recent Labs    06/29/18 0051 07/01/18 0524  WBC 11.7* 10.8*  HGB 7.0* 7.8*  HCT 23.7* 25.8*  NA 136 136  K 3.6 3.6  CL 102 98  CO2 28 31  BUN 16 12  CREATININE 1.39* 0.93   Liver Panel Recent Labs    06/29/18 0051 07/01/18 0524  PROT 6.5 6.1*  ALBUMIN 1.8* 1.8*  AST 48* 41  ALT 36 26  ALKPHOS 99 87  BILITOT 0.5 0.3   Sedimentation Rate Recent Labs    06/29/18 0051  ESRSEDRATE 90*   C-Reactive Protein Recent Labs    06/29/18 0051  CRP 2.7*    Microbiology: Recent Results (from the past 240 hour(s))  Culture, blood (Routine X 2) w Reflex to ID Panel     Status: None (Preliminary result)   Collection Time: 06/29/18 12:52 AM  Result Value Ref Range Status   Specimen Description   Final     BLOOD RIGHT FOREARM Performed at [facility], [address], [place], [place] [address]    Special Requests   Final    BOTTLES DRAWN AEROBIC AND ANAEROBIC Blood Culture adequate volume Performed at [facility], [address], [place], [place] [address]    Culture   Final    NO GROWTH 2 DAYS Performed at [facility], [address], [place], [place] [address]    Report Status PENDING  Incomplete  Culture, blood (Routine X 2) w Reflex to ID Panel     Status: None (Preliminary result)   Collection Time: 06/29/18 12:52 AM  Result Value Ref Range Status   Specimen Description   Final    BLOOD LEFT ANTECUBITAL Performed at [facility], [address], [place], [place] [address]    Special Requests   Final    BOTTLES DRAWN AEROBIC AND ANAEROBIC Blood Culture adequate volume Performed at [facility], [address], [place], [place] [address]    Culture   Final    NO GROWTH 2 DAYS Performed at [facility], [address], [place], [place] [address]    Report Status PENDING  Incomplete  Studies/Results: No results found.   Assessment/Plan: MSSA bacteremia Tricuspid regurg TV IE Septic Pulm emboli IVDA (active) Protein calorie malnutrition  Total days of antibiotics: 2 ancef  Await TEE Cr stable BNP elevated BCx (10-23) are ngtd Restarted suboxone yesterday as well as detox protocol. My appreciation to pharmacy He feels better now and would like to go home.  I explained to him that the best care (which he says he wants) for him would be for him to stay in hospital and receive IV anbx.  I assured him that we could not send him home with an IV.  If he leaves, would ask pharmacy to give him oritavancin prior to leaving.  Would continue his po keflex after d/c.           Johny Sax MD, FACP Infectious Diseases (pager) [phone] [email or web address] 07/01/2018, 10:32  AM  LOS: 2 days

## 2018-07-01 NOTE — Care Management Note (Signed)
Case Management Note  Patient Details  Name: Oscar Burke MRN: [id number] Date of Birth: 1980/12/19  Subjective/Objective:  Left AMA                  Action/Plan:   Expected Discharge Date:  (unknown)               Expected Discharge Plan:  Against Medical Advice  In-House Referral:  Clinical Social Work  Discharge planning Services  CM Consult  Post Acute Care Choice:    Choice offered to:     DME Arranged:    DME Agency:     HH Arranged:    HH Agency:     Status of Service:  Completed, signed off  If discussed at [employer], dates discussed:    Additional Comments:  Lanier Clam, RN 07/01/2018, 3:35 PM

## 2018-07-01 NOTE — Progress Notes (Signed)
Pt's IV Antibiotic Oscar Burke has finished. Pt is now wanting to be discharged from the Hospital. Pt states "The Doctor said is I finished that bag I could leave" Dr. Ella Jubilee updated on the phone and MD states that if Pt wants to leave the hospital the Pt would have to sign out AMA. RN updated Pt and Pt signed AMA papers

## 2018-07-04 LAB — CULTURE, BLOOD (ROUTINE X 2)
CULTURE: NO GROWTH
Culture: NO GROWTH
Special Requests: ADEQUATE
Special Requests: ADEQUATE

## 2018-08-27 ENCOUNTER — Emergency Department (HOSPITAL_COMMUNITY)
Admission: EM | Admit: 2018-08-27 | Discharge: 2018-08-27 | Disposition: A | Discharge status: Home / Self Care | Payer: Self-pay | Attending: Emergency Medicine | Admitting: Emergency Medicine

## 2018-08-27 ENCOUNTER — Encounter (HOSPITAL_COMMUNITY): Payer: Self-pay | Admitting: Emergency Medicine

## 2018-08-27 ENCOUNTER — Emergency Department (HOSPITAL_COMMUNITY): Payer: Self-pay

## 2018-08-27 DIAGNOSIS — T401X4A Poisoning by heroin, undetermined, initial encounter: Secondary | ICD-10-CM | POA: Insufficient documentation

## 2018-08-27 DIAGNOSIS — F17210 Nicotine dependence, cigarettes, uncomplicated: Secondary | ICD-10-CM | POA: Insufficient documentation

## 2018-08-27 DIAGNOSIS — T401X1A Poisoning by heroin, accidental (unintentional), initial encounter: Secondary | ICD-10-CM

## 2018-08-27 DIAGNOSIS — F319 Bipolar disorder, unspecified: Secondary | ICD-10-CM | POA: Insufficient documentation

## 2018-08-27 LAB — CBC WITH DIFFERENTIAL/PLATELET
ABS IMMATURE GRANULOCYTES: 0.05 10*3/uL (ref 0.00–0.07)
Basophils Absolute: 0 10*3/uL (ref 0.0–0.1)
Basophils Relative: 0 %
Eosinophils Absolute: 0.1 10*3/uL (ref 0.0–0.5)
Eosinophils Relative: 1 %
HCT: 42 % (ref 39.0–52.0)
Hemoglobin: 12.8 g/dL — ABNORMAL LOW (ref 13.0–17.0)
IMMATURE GRANULOCYTES: 1 %
Lymphocytes Relative: 9 %
Lymphs Abs: 0.9 10*3/uL (ref 0.7–4.0)
MCH: 28.3 pg (ref 26.0–34.0)
MCHC: 30.5 g/dL (ref 30.0–36.0)
MCV: 92.9 fL (ref 80.0–100.0)
Monocytes Absolute: 0.7 10*3/uL (ref 0.1–1.0)
Monocytes Relative: 7 %
NEUTROS ABS: 7.9 10*3/uL — AB (ref 1.7–7.7)
NEUTROS PCT: 82 %
NRBC: 0 % (ref 0.0–0.2)
Platelets: 283 10*3/uL (ref 150–400)
RBC: 4.52 MIL/uL (ref 4.22–5.81)
RDW: 15.5 % (ref 11.5–15.5)
WBC: 9.6 10*3/uL (ref 4.0–10.5)

## 2018-08-27 LAB — URINALYSIS, ROUTINE W REFLEX MICROSCOPIC
Bilirubin Urine: NEGATIVE
Glucose, UA: NEGATIVE mg/dL
Ketones, ur: NEGATIVE mg/dL
Leukocytes, UA: NEGATIVE
NITRITE: NEGATIVE
Protein, ur: 100 mg/dL — AB
Specific Gravity, Urine: >1.03 — ABNORMAL HIGH (ref 1.005–1.030)
pH: 5.5 (ref 5.0–8.0)

## 2018-08-27 LAB — COMPREHENSIVE METABOLIC PANEL
ALT: 22 U/L (ref 0–44)
AST: 23 U/L (ref 15–41)
Albumin: 3.5 g/dL (ref 3.5–5.0)
Alkaline Phosphatase: 55 U/L (ref 38–126)
Anion gap: 7 (ref 5–15)
BUN: 20 mg/dL (ref 6–20)
CO2: 29 mmol/L (ref 22–32)
Calcium: 8.7 mg/dL — ABNORMAL LOW (ref 8.9–10.3)
Chloride: 105 mmol/L (ref 98–111)
Creatinine, Ser: 0.83 mg/dL (ref 0.61–1.24)
GFR calc Af Amer: >60 mL/min (ref >60)
GFR calc non Af Amer: >60 mL/min (ref >60)
Glucose, Bld: 107 mg/dL — ABNORMAL HIGH (ref 70–99)
POTASSIUM: 4.1 mmol/L (ref 3.5–5.1)
Sodium: 141 mmol/L (ref 135–145)
Total Bilirubin: 0.5 mg/dL (ref 0.3–1.2)
Total Protein: 6.7 g/dL (ref 6.5–8.1)

## 2018-08-27 LAB — URINALYSIS, MICROSCOPIC (REFLEX): Bacteria, UA: NONE SEEN

## 2018-08-27 LAB — RAPID URINE DRUG SCREEN, HOSP PERFORMED
AMPHETAMINES: POSITIVE — AB
Barbiturates: NOT DETECTED
Benzodiazepines: NOT DETECTED
Cocaine: NOT DETECTED
Opiates: NOT DETECTED
Tetrahydrocannabinol: NOT DETECTED

## 2018-08-27 LAB — I-STAT CG4 LACTIC ACID, ED: Lactic Acid, Venous: 1.07 mmol/L (ref 0.5–1.9)

## 2018-08-27 MED ORDER — SODIUM CHLORIDE 0.9 % IV BOLUS (SEPSIS)
250.0000 mL | Freq: Once | INTRAVENOUS | Status: AC
  Administered 2018-08-27: 250 mL via INTRAVENOUS

## 2018-08-27 MED ORDER — SODIUM CHLORIDE 0.9 % IV BOLUS (SEPSIS)
1000.0000 mL | Freq: Once | INTRAVENOUS | Status: AC
  Administered 2018-08-27: 1000 mL via INTRAVENOUS

## 2018-08-27 NOTE — ED Notes (Signed)
Pt given food/drink. Tolerated well.

## 2018-08-27 NOTE — ED Triage Notes (Signed)
Per EMS, pt here for lethargic behavior/heroin overdose. Pt was responsive to pain upon EMS arrival. Pt has history of heroin abuse. Pt awoke on his own, EMS states the pt falls asleep while talking to him. Pt has also been treated for infection over the past 2 months but has not been able to finish his antibiotics.   BP 138/100 HR 102 CBG 201

## 2018-08-27 NOTE — Discharge Instructions (Addendum)
Avoid using illegal drugs.  Return here if needed for problems.

## 2018-08-27 NOTE — ED Notes (Signed)
Bed: [id number] Expected date:  Expected time:  Means of arrival:  Comments[facility]

## 2018-08-27 NOTE — ED Provider Notes (Signed)
[facility]-EMERGENCY DEPT Provider Note   CSN: [id number] Arrival date & time: 08/27/18  1858     History   Chief Complaint Chief Complaint  Patient presents with  . Drug Overdose    HPI Oscar Burke is a 37 y.o. male.  HPI   He presents for evaluation of altered mental status, while at home.  He states he was cooking dinner when he used some heroin, and cannot remember anything else until EMS arrived.  He was assessed and transferred without intervention or treatment.  Patient reports having intermittent chest pain, since being treated for endocarditis several months ago.  He states he is completed his antibiotic treatment.  He denies fever, chills, cough, shortness of breath, weakness or dizziness.  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  There are no other known modifying factors.  Past Medical History:  Diagnosis Date  . Bipolar 1 disorder (HCC)   . Chronic back pain   . Hepatitis C   . Polysubstance abuse (HCC)   . Smoker     Patient Active Problem List   Diagnosis Date Noted  . Endocarditis 06/29/2018  . MSSA bacteremia   . Empyema lung (HCC)   . Substance induced mood disorder (HCC)   . Abdominal pain 06/08/2018  . Hepatitis C, chronic (HCC) 06/08/2018  . 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 Septic embolism (HCC) 06/08/2018  . Multifocal pneumonia 06/08/2018  . Tobacco abuse 06/08/2018  . Polysubstance abuse (HCC) 04/07/2017  . Leukocytosis 04/07/2017  . Alcohol dependence with unspecified alcohol-induced disorder (HCC)   . Opiate overdose (HCC) 12/13/2013  . Encephalopathy acute 12/13/2013  . Acute on chronic respiratory failure with hypoxia (HCC) 12/13/2013    Past Surgical History:  Procedure Laterality Date  . ARM AMPUTATION    . MANDIBLE FRACTURE SURGERY    . TEE WITHOUT CARDIOVERSION N/A 06/13/2018   Procedure: TRANSESOPHAGEAL ECHOCARDIOGRAM (TEE);  Surgeon: Wendall StadeNishan, Peter C, MD;  [facility];  Service:  Cardiovascular;  Laterality: N/A;        Home Medications    Prior to Admission medications   Medication Sig Start Date End Date Taking? Authorizing Provider  Omega-3 Fatty Acids (FISH OIL) 1000 MG CPDR Take 1,000 mg by mouth daily.   Yes [provider]  cephALEXin (KEFLEX) 500 MG capsule Take 1 capsule (500 mg total) by mouth 3 (three) times daily. Patient not taking: Reported on 08/27/2018 06/22/18 09/10/18  Ginnie SmartHatcher, Jeffrey C, MD    Family History Family History  Problem Relation Age of Onset  . Cancer Mother     Social History Social History   Tobacco Use  . Smoking status: Current Every Day Smoker    Packs/day: 1.00    Types: Cigarettes  . Smokeless tobacco: Never Used  Substance Use Topics  . Alcohol use: No    Frequency: Never  . Drug use: Yes    Types: Cocaine, IV    Comment: Heroin, ice, crack, and morphine pills     Allergies   Patient has no known allergies.   Review of Systems Review of Systems  All other systems reviewed and are negative.    Physical Exam Updated Vital Signs BP 125/88 (BP Location: Left Arm)   Pulse 65   Temp 98.1 F (36.7 C) (Oral)   Resp 13   Ht 5\' 10"  (1.778 m)   Wt 74.8 kg   SpO2 100%   BMI 23.68 kg/m   Physical Exam Vitals signs and nursing  note reviewed.  Constitutional:      Appearance: He is well-developed.  HENT:     Head: Normocephalic and atraumatic.     Right Ear: External ear normal.     Left Ear: External ear normal.  Eyes:     Conjunctiva/sclera: Conjunctivae normal.     Pupils: Pupils are equal, round, and reactive to light.  Neck:     Musculoskeletal: Normal range of motion and neck supple.     Trachea: Phonation normal.  Cardiovascular:     Rate and Rhythm: Normal rate and regular rhythm.     Heart sounds: Normal heart sounds.  Pulmonary:     Effort: Pulmonary effort is normal.     Breath sounds: Normal breath sounds.  Abdominal:     Palpations: Abdomen is soft.     Tenderness:  There is no abdominal tenderness.  Musculoskeletal: Normal range of motion.        General: No swelling or tenderness.  Skin:    General: Skin is warm and dry.  Neurological:     Mental Status: He is alert and oriented to person, place, and time.     Cranial Nerves: No cranial nerve deficit.     Sensory: No sensory deficit.     Motor: No abnormal muscle tone.     Coordination: Coordination normal.  Psychiatric:        Behavior: Behavior normal.        Thought Content: Thought content normal.        Judgment: Judgment normal.      ED Treatments / Results  Labs (all labs ordered are listed, but only abnormal results are displayed) Labs Reviewed  COMPREHENSIVE METABOLIC PANEL - Abnormal; Notable for the following components:      Result Value   Glucose, Bld 107 (*)    Calcium 8.7 (*)    All other components within normal limits  CBC WITH DIFFERENTIAL/PLATELET - Abnormal; Notable for the following components:   Hemoglobin 12.8 (*)    Neutro Abs 7.9 (*)    All other components within normal limits  URINALYSIS, ROUTINE W REFLEX MICROSCOPIC - Abnormal; Notable for the following components:   Specific Gravity, Urine >1.030 (*)    Hgb urine dipstick LARGE (*)    Protein, ur 100 (*)    All other components within normal limits  RAPID URINE DRUG SCREEN, HOSP PERFORMED - Abnormal; Notable for the following components:   Amphetamines POSITIVE (*)    All other components within normal limits  CULTURE, BLOOD (ROUTINE X 2)  CULTURE, BLOOD (ROUTINE X 2)  URINE CULTURE  URINALYSIS, MICROSCOPIC (REFLEX)  I-STAT CG4 LACTIC ACID, ED  I-STAT CG4 LACTIC ACID, ED    EKG EKG Interpretation  Date/Time:  Saturday August 27 2018 19:19:31 EST Ventricular Rate:  79 PR Interval:    QRS Duration: 101 QT Interval:  391 QTC Calculation: 449 R Axis:   79 Text Interpretation:  Sinus rhythm Probable left atrial enlargement since last tracing no significant change Confirmed by Mancel Bale  [phone]) on 08/27/2018 7:26:50 PM   Radiology Dg Chest Port 1 View  Result Date: 08/27/2018 CLINICAL DATA:  Lethargy, heroin overdose, history of septic emboli EXAM: PORTABLE CHEST 1 VIEW COMPARISON:  Portable exam 1925 hours compared to 06/29/2018 FINDINGS: Normal heart size, mediastinal contours, and pulmonary vascularity. Lungs hyperinflated but clear. No infiltrate, pleural effusion or pneumothorax. Nodular foci seen on the previous exam resolved. IMPRESSION: No acute abnormalities. Electronically Signed   By: Ulyses Southward  M.D.   On: 08/27/2018 19:49    Procedures Procedures (including critical care time)  Medications Ordered in ED Medications  sodium chloride 0.9 % bolus 1,000 mL (0 mLs Intravenous Stopped 08/27/18 2259)    And  sodium chloride 0.9 % bolus 1,000 mL (0 mLs Intravenous Stopped 08/27/18 2259)    And  sodium chloride 0.9 % bolus 250 mL (0 mLs Intravenous Stopped 08/27/18 2259)     Initial Impression / Assessment and Plan / ED Course  I have reviewed the triage vital signs and the nursing notes.  Pertinent labs & imaging results that were available during my care of the patient were reviewed by me and considered in my medical decision making (see chart for details).      Patient Vitals for the past 24 hrs:  BP Temp Temp src Pulse Resp SpO2 Height Weight  08/27/18 2016 125/88 98.1 F (36.7 C) Oral 65 13 100 % - -  08/27/18 1930 (!) 120/96 - - (!) 34 12 (!) 88 % - -  08/27/18 1918 117/88 98.3 F (36.8 C) Oral 88 12 98 % 5\' 10"  (1.778 m) 74.8 kg  08/27/18 1915 - - - - - 93 % - -    11:29 PM Reevaluation with update and discussion. After initial assessment and treatment, an updated evaluation reveals he is alert and comfortable.  He has no further complaints.  Findings discussed with patient all questions were answered.Mancel Bale. Sharmon Cheramie   Medical Decision Making: Mental status associated with use of heroin.  Patient was recovered without Narcan.  Screening for  cardiopulmonary dysfunction is normal.  Doubt sepsis, metabolic instability or impending vascular collapse.  CRITICAL CARE-no Performed by: Mancel BaleElliott Pierra Skora  Nursing Notes Reviewed/ Care Coordinated Applicable Imaging Reviewed Interpretation of Laboratory Data incorporated into ED treatment  The patient appears reasonably screened and/or stabilized for discharge and I doubt any other medical condition or other [facility] requiring further screening, evaluation, or treatment in the ED at this time prior to discharge.  Plan: Home Medications-continue usual medications; Home Treatments-avoid illegal substances; return here if the recommended treatment, does not improve the symptoms; Recommended follow up-PCP of choice as needed   Final Clinical Impressions(s) / ED Diagnoses   Final diagnoses:  Accidental overdose of heroin, initial encounter [facility](HCC)    ED Discharge Orders    None       Mancel BaleWentz, Jazari Ober, MD 08/27/18 [phone]

## 2018-08-29 LAB — URINE CULTURE: Culture: NO GROWTH

## 2018-09-02 LAB — CULTURE, BLOOD (ROUTINE X 2)
Culture: NO GROWTH
Special Requests: ADEQUATE

## 2019-04-03 ENCOUNTER — Emergency Department (HOSPITAL_COMMUNITY)
Admission: EM | Admit: 2019-04-03 | Discharge: 2019-04-04 | Disposition: A | Discharge status: Home / Self Care | Payer: Self-pay | Attending: Emergency Medicine | Admitting: Emergency Medicine

## 2019-04-03 ENCOUNTER — Encounter (HOSPITAL_COMMUNITY): Payer: Self-pay

## 2019-04-03 ENCOUNTER — Other Ambulatory Visit: Payer: Self-pay

## 2019-04-03 DIAGNOSIS — F1910 Other psychoactive substance abuse, uncomplicated: Secondary | ICD-10-CM | POA: Insufficient documentation

## 2019-04-03 DIAGNOSIS — F17210 Nicotine dependence, cigarettes, uncomplicated: Secondary | ICD-10-CM | POA: Insufficient documentation

## 2019-04-03 DIAGNOSIS — R451 Restlessness and agitation: Secondary | ICD-10-CM | POA: Insufficient documentation

## 2019-04-03 MED ORDER — LORAZEPAM 2 MG/ML IJ SOLN
1.0000 mg | Freq: Once | INTRAMUSCULAR | Status: AC
  Administered 2019-04-03: 1 mg via INTRAVENOUS
  Filled 2019-04-03: qty 1

## 2019-04-03 NOTE — ED Triage Notes (Signed)
Pt arrived via [facility] after injecting himself with molly and heroin roughly an hour ago. Pt states he is staying at a halfway house and is having domestic issues, states he took the drugs to take the edge off. Denies SI and HI.

## 2019-04-03 NOTE — ED Provider Notes (Signed)
[facility]-EMERGENCY DEPT Provider Note   CSN: [id number] Arrival date & time: 04/03/19  2244  Time seen 11:28 PM  History   Chief Complaint Chief Complaint  Patient presents with  . Drug Overdose    HPI Albertina Parrnthony Konicki is a 38 y.o. male.   Level 5 caveat for intoxication  HPI patient states he is an addict and he usually does heroin.  He states [facility] 28-day program and graduated in 2016.  He cannot tell me how long he was sober before he started using again.  He states about 4 or 5 nights ago he started using IV meth for the first time.  Tonight he started feeling bad and thought he was withdrawing so he did IV heroin and thinks maybe he had some fentanyl in it.  He is staying with a friend who is trying to help him become sober.  He states tonight evidently he was loud at the complex and somebody called the police.  When the police arrived he asked to be brought to the emergency department.  Patient denies any suicidal intent.  He states he was just trying to get high.  He states he has not slept in the last 4-5 nights.  Patient is very fidgety and cannot sit still, he has difficulty concentrating and answering questions.  He denies being sick such as cough, sore throat, nausea, vomiting, diarrhea, chest pain, shortness of breath.  Patient states he needs to get a hold of his friend to help him get [facility].  PCP Patient, No Pcp Per   Past Medical History:  Diagnosis Date  . Bipolar 1 disorder (HCC)   . Chronic back pain   . Hepatitis C   . Polysubstance abuse (HCC)   . Smoker     Patient Active Problem List   Diagnosis Date Noted  . Endocarditis 06/29/2018  . MSSA bacteremia   . Empyema lung (HCC)   . Substance induced mood disorder (HCC)   . Abdominal pain 06/08/2018  . Hepatitis C, chronic (HCC) 06/08/2018  . 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 Septic embolism (HCC) 06/08/2018  . Multifocal pneumonia 06/08/2018   . Tobacco abuse 06/08/2018  . Polysubstance abuse (HCC) 04/07/2017  . Leukocytosis 04/07/2017  . Alcohol dependence with unspecified alcohol-induced disorder (HCC)   . Opiate overdose (HCC) 12/13/2013  . Encephalopathy acute 12/13/2013  . Acute on chronic respiratory failure with hypoxia (HCC) 12/13/2013    Past Surgical History:  Procedure Laterality Date  . ARM AMPUTATION    . MANDIBLE FRACTURE SURGERY    . TEE WITHOUT CARDIOVERSION N/A 06/13/2018   Procedure: TRANSESOPHAGEAL ECHOCARDIOGRAM (TEE);  Surgeon: Wendall StadeNishan, Peter C, MD;  [facility];  Service: Cardiovascular;  Laterality: N/A;        Home Medications    Prior to Admission medications   Not on File    Family History Family History  Problem Relation Age of Onset  . Cancer Mother     Social History Social History   Tobacco Use  . Smoking status: Current Every Day Smoker    Packs/day: 1.00    Types: Cigarettes  . Smokeless tobacco: Never Used  Substance Use Topics  . Alcohol use: No    Frequency: Never  . 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   Review of Systems Review of Systems  All other systems reviewed and are negative.    Physical Exam Updated Vital Signs BP (!) 112/91 (BP Location: Right Arm)   Pulse 86   Resp 16   SpO2 96%    Vital signs normal    Physical Exam Vitals signs and nursing note reviewed.  Constitutional:      Comments: Thin male who is pacing around the room, he is very fidgety and having trouble holding still  HENT:     Head: Normocephalic and atraumatic.     Left Ear: External ear normal.     Nose: Nose normal.     Mouth/Throat:     Mouth: Mucous membranes are dry.  Eyes:     Extraocular Movements: Extraocular movements intact.     Conjunctiva/sclera: Conjunctivae normal.     Pupils: Pupils are equal, round, and  reactive to light.     Comments: Sclera are injected bilaterally  Neck:     Musculoskeletal: Normal range of motion.  Cardiovascular:     Rate and Rhythm: Regular rhythm. Tachycardia present.     Pulses: Normal pulses.     Heart sounds: Normal heart sounds.  Pulmonary:     Effort: Pulmonary effort is normal. No respiratory distress.     Breath sounds: Rhonchi present. No wheezing or rales.  Musculoskeletal: Normal range of motion.  Skin:    General: Skin is warm and dry.  Neurological:     General: No focal deficit present.     Mental Status: He is oriented to person, place, and time.     Cranial Nerves: No cranial nerve deficit.  Psychiatric:        Mood and Affect: Mood is anxious.        Speech: Speech is rapid and pressured.        Behavior: Behavior is agitated.        Thought Content: Thought content does not include homicidal or suicidal ideation.      ED Treatments / Results  Labs (all labs ordered are listed, but only abnormal results are displayed) Labs Reviewed - No data to display  EKG None  Radiology No results found.  Procedures Procedures (including critical care time)  Medications Ordered in ED Medications  LORazepam (ATIVAN) injection 1 mg (1 mg Intravenous Given 04/03/19 2354)     Initial Impression / Assessment and Plan / ED Course  I have reviewed the triage vital signs and the nursing notes.  Pertinent labs & imaging results that were available during my care of the patient were reviewed by me and considered in my medical decision making (see chart for details).    Patient had to be talked into getting the Ativan.  He remains fidgety and having trouble sitting still.  At one point he wanted just to walk around the ED without a mask and he had to be told to go back to his room.  Patient finally states he wants to leave.  He has expressed no suicidal or homicidal ideation.  He states he has somebody who is coming to pick him up.  Patient knows he  needs to get help and is interested in going to [facility].  He was given outpatient referrals.  At this point I do not feel like he needs to be held in the ED against his will.  He appears to have decision-making capacity.      Final Clinical Impressions(s) / ED Diagnoses  Final diagnoses:  Polysubstance abuse [facility])    ED Discharge Orders    None     Plan discharge  Rolland Porter, MD, Barbette Or, MD 04/04/19 0120

## 2019-04-04 NOTE — ED Notes (Signed)
Pt wanting to leave, off duty officer came with this nurse and spoke to the patient about getting the help he needs for his safety. Pt agreed to stay.

## 2019-04-04 NOTE — ED Notes (Signed)
Pt allowed this EMT to take blood pressure. Pt remained otherwise agitated.

## 2019-04-04 NOTE — ED Notes (Signed)
Pt states he can not tolerate being on the monitor right now. Pt given phone and cup of water per request.

## 2019-04-04 NOTE — ED Notes (Signed)
Pt walking around room yelling to himself, pt refusing to sit down and attempting to leave.

## 2019-04-04 NOTE — Discharge Instructions (Addendum)
You need to consider going to an outpatient facility to get help with your substance abuse again.

## 2019-05-10 ENCOUNTER — Other Ambulatory Visit: Payer: Self-pay

## 2019-05-10 ENCOUNTER — Emergency Department (HOSPITAL_COMMUNITY)
Admission: EM | Admit: 2019-05-10 | Discharge: 2019-05-10 | Discharge status: Against Medical Advice | Payer: Self-pay | Attending: Emergency Medicine | Admitting: Emergency Medicine

## 2019-05-10 ENCOUNTER — Emergency Department (HOSPITAL_COMMUNITY): Payer: Self-pay

## 2019-05-10 ENCOUNTER — Encounter (HOSPITAL_COMMUNITY): Payer: Self-pay

## 2019-05-10 DIAGNOSIS — R0602 Shortness of breath: Secondary | ICD-10-CM | POA: Insufficient documentation

## 2019-05-10 DIAGNOSIS — Z7151 Drug abuse counseling and surveillance of drug abuser: Secondary | ICD-10-CM | POA: Insufficient documentation

## 2019-05-10 DIAGNOSIS — F17210 Nicotine dependence, cigarettes, uncomplicated: Secondary | ICD-10-CM | POA: Insufficient documentation

## 2019-05-10 DIAGNOSIS — K921 Melena: Secondary | ICD-10-CM | POA: Insufficient documentation

## 2019-05-10 DIAGNOSIS — T401X1A Poisoning by heroin, accidental (unintentional), initial encounter: Secondary | ICD-10-CM | POA: Insufficient documentation

## 2019-05-10 DIAGNOSIS — M549 Dorsalgia, unspecified: Secondary | ICD-10-CM | POA: Insufficient documentation

## 2019-05-10 DIAGNOSIS — Z89209 Acquired absence of unspecified upper limb, unspecified level: Secondary | ICD-10-CM | POA: Insufficient documentation

## 2019-05-10 DIAGNOSIS — R0789 Other chest pain: Secondary | ICD-10-CM | POA: Insufficient documentation

## 2019-05-10 DIAGNOSIS — Z5329 Procedure and treatment not carried out because of patient's decision for other reasons: Secondary | ICD-10-CM | POA: Insufficient documentation

## 2019-05-10 DIAGNOSIS — Z79899 Other long term (current) drug therapy: Secondary | ICD-10-CM | POA: Insufficient documentation

## 2019-05-10 DIAGNOSIS — T50901A Poisoning by unspecified drugs, medicaments and biological substances, accidental (unintentional), initial encounter: Secondary | ICD-10-CM

## 2019-05-10 DIAGNOSIS — K6289 Other specified diseases of anus and rectum: Secondary | ICD-10-CM | POA: Insufficient documentation

## 2019-05-10 DIAGNOSIS — R55 Syncope and collapse: Secondary | ICD-10-CM | POA: Insufficient documentation

## 2019-05-10 DIAGNOSIS — R05 Cough: Secondary | ICD-10-CM | POA: Insufficient documentation

## 2019-05-10 LAB — RAPID URINE DRUG SCREEN, HOSP PERFORMED
Amphetamines: NOT DETECTED
Barbiturates: NOT DETECTED
Benzodiazepines: NOT DETECTED
Cocaine: NOT DETECTED
Opiates: NOT DETECTED
Tetrahydrocannabinol: NOT DETECTED

## 2019-05-10 LAB — COMPREHENSIVE METABOLIC PANEL
ALT: 33 U/L (ref 0–44)
AST: 27 U/L (ref 15–41)
Albumin: 3.7 g/dL (ref 3.5–5.0)
Alkaline Phosphatase: 50 U/L (ref 38–126)
Anion gap: 7 (ref 5–15)
BUN: 11 mg/dL (ref 6–20)
CO2: 32 mmol/L (ref 22–32)
Calcium: 9.6 mg/dL (ref 8.9–10.3)
Chloride: 103 mmol/L (ref 98–111)
Creatinine, Ser: 0.97 mg/dL (ref 0.61–1.24)
GFR calc Af Amer: >60 mL/min (ref >60)
GFR calc non Af Amer: >60 mL/min (ref >60)
Glucose, Bld: 124 mg/dL — ABNORMAL HIGH (ref 70–99)
Potassium: 4.2 mmol/L (ref 3.5–5.1)
Sodium: 142 mmol/L (ref 135–145)
Total Bilirubin: 0.4 mg/dL (ref 0.3–1.2)
Total Protein: 6.7 g/dL (ref 6.5–8.1)

## 2019-05-10 LAB — CBC WITH DIFFERENTIAL/PLATELET
Abs Immature Granulocytes: 0.17 10*3/uL — ABNORMAL HIGH (ref 0.00–0.07)
Basophils Absolute: 0.1 10*3/uL (ref 0.0–0.1)
Basophils Relative: 1 %
Eosinophils Absolute: 0.1 10*3/uL (ref 0.0–0.5)
Eosinophils Relative: 1 %
HCT: 40.8 % (ref 39.0–52.0)
Hemoglobin: 13.1 g/dL (ref 13.0–17.0)
Immature Granulocytes: 1 %
Lymphocytes Relative: 12 %
Lymphs Abs: 1.4 10*3/uL (ref 0.7–4.0)
MCH: 29.6 pg (ref 26.0–34.0)
MCHC: 32.1 g/dL (ref 30.0–36.0)
MCV: 92.3 fL (ref 80.0–100.0)
Monocytes Absolute: 1.3 10*3/uL — ABNORMAL HIGH (ref 0.1–1.0)
Monocytes Relative: 11 %
Neutro Abs: 8.8 10*3/uL — ABNORMAL HIGH (ref 1.7–7.7)
Neutrophils Relative %: 74 %
Platelets: 175 10*3/uL (ref 150–400)
RBC: 4.42 MIL/uL (ref 4.22–5.81)
RDW: 13 % (ref 11.5–15.5)
WBC: 11.8 10*3/uL — ABNORMAL HIGH (ref 4.0–10.5)
nRBC: 0 % (ref 0.0–0.2)

## 2019-05-10 LAB — TROPONIN I (HIGH SENSITIVITY): Troponin I (High Sensitivity): 10 ng/L (ref <18)

## 2019-05-10 NOTE — ED Notes (Addendum)
Patient stated they needed to leave to sign bond papers. The MD was made aware that patient wanted to leave. Patient signed [place] paperwork.   Patient ambulated with steady gate. Patient was stable upon discharge.

## 2019-05-10 NOTE — ED Triage Notes (Addendum)
Per EMS, pt from home, where they found him in respiratory distress after injecting heroin and fentanyl about 45-60 min ago. Pt was ventilated for approximately 4-6 minutes. Pt states his girlfriend may have given him narcan before EMS arrival. Per EMS, pt wants to seek addiction rehab.
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
## 2019-05-15 LAB — CULTURE, BLOOD (ROUTINE X 2)
Culture: NO GROWTH
Culture: NO GROWTH
Special Requests: ADEQUATE
Special Requests: ADEQUATE

## 2019-07-18 IMAGING — MR MR FOOT*R* W/O CM
4 of 7 series · 19 of 40 positions shown · non-contrast
Comparison: None.

CLINICAL DATA: Draining wound. Evaluate for osteomyelitis. Wound is
along the plantar aspect of the foot.

EXAM:
MRI OF THE RIGHT FOREFOOT WITHOUT CONTRAST
TECHNIQUE: Multiplanar, multisequence MR imaging of the right foot was
performed. No intravenous contrast was administered.

[Series 5: T1 · coronal · 3.0mm · 0.27mm/px · 5 of 46 slices shown (1 of 2)]
[im 1/46]
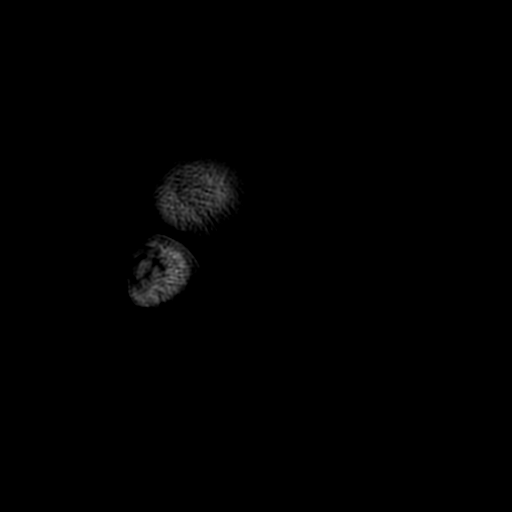
[im 8/46]
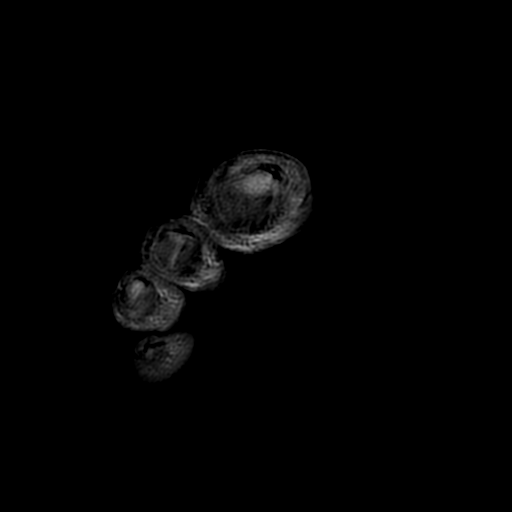
[im 16/46]
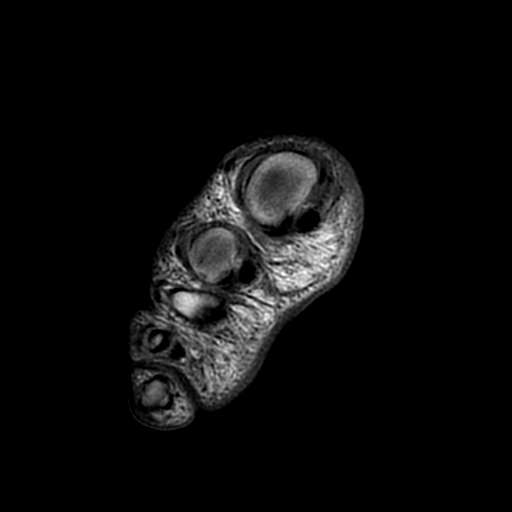
[im 23/46]
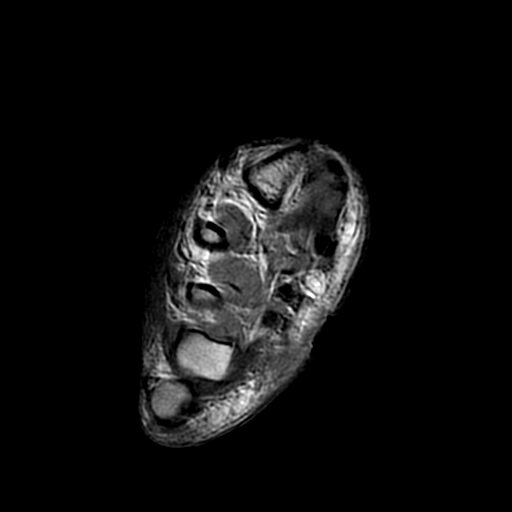
[im 38/46]
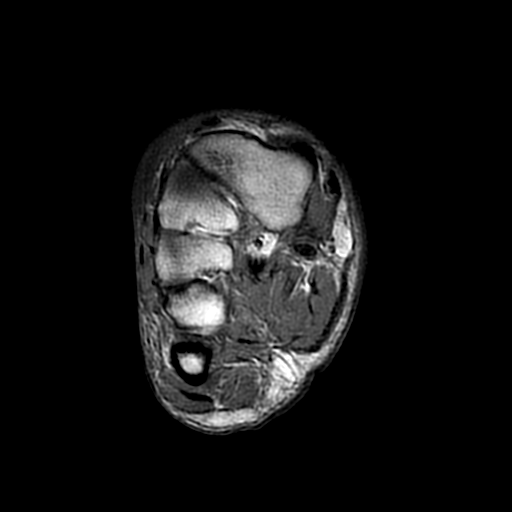

[Series 6: T2 fat-sat · coronal · 3.0mm · 0.27mm/px · 8 of 46 slices shown]
[im 1/46]
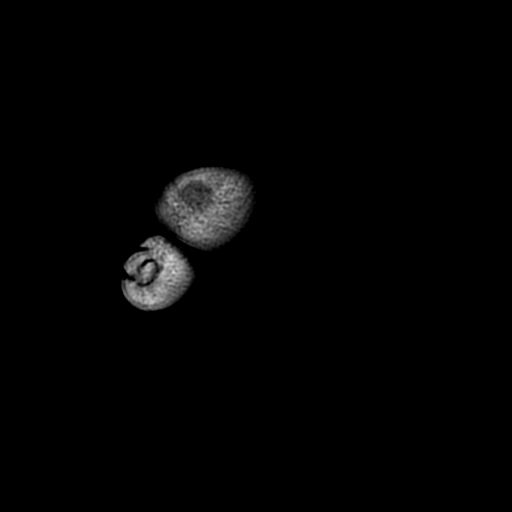
[im 7/46]
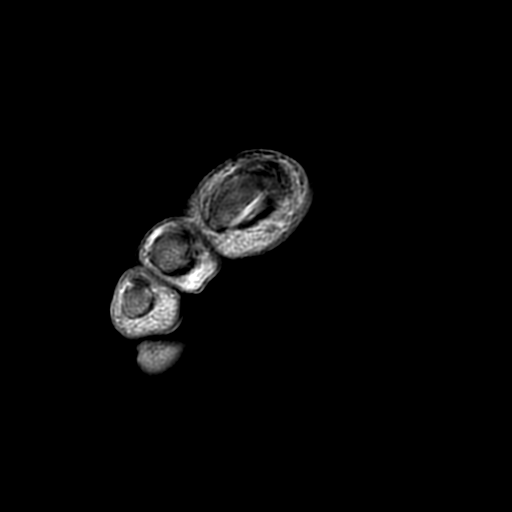
[im 13/46]
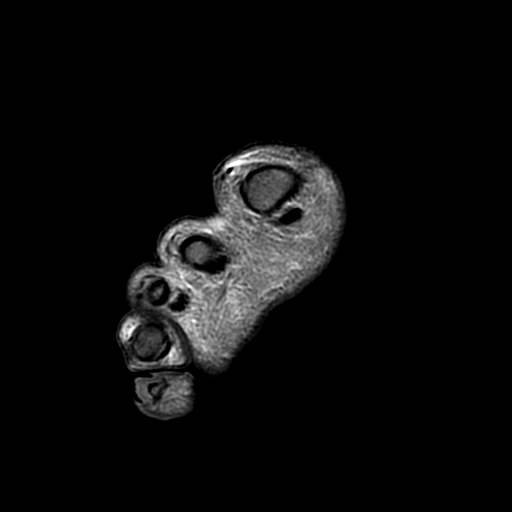
[im 20/46]
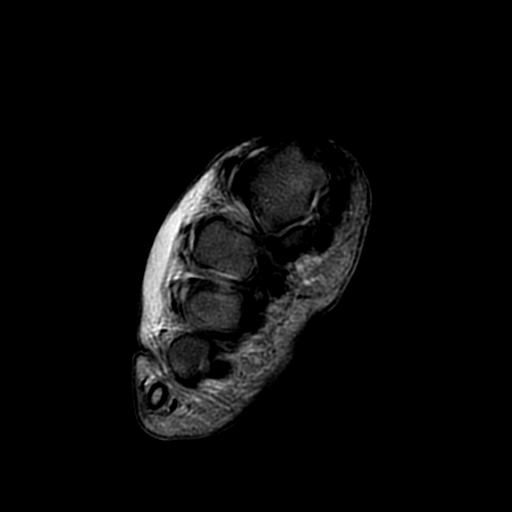
[im 26/46]
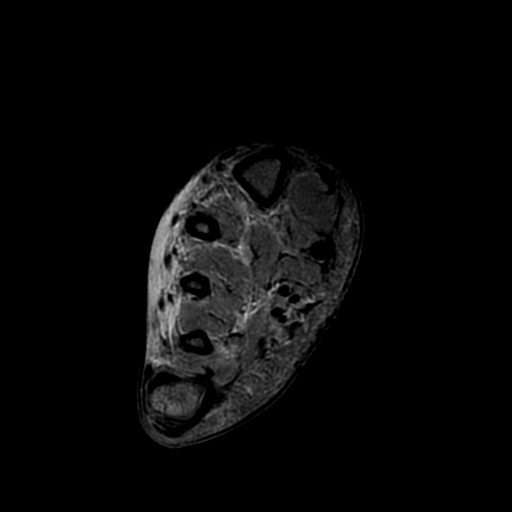
[im 33/46]
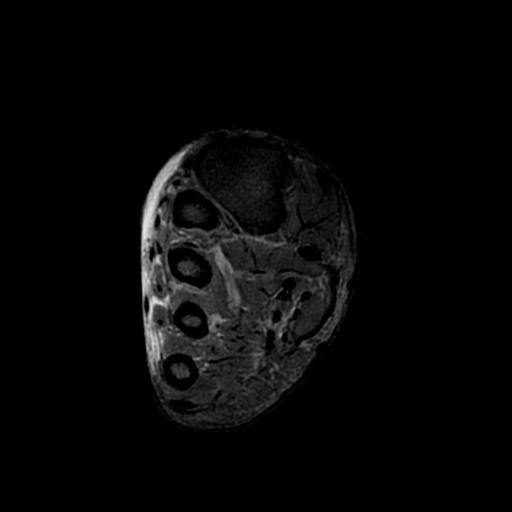
[im 39/46]
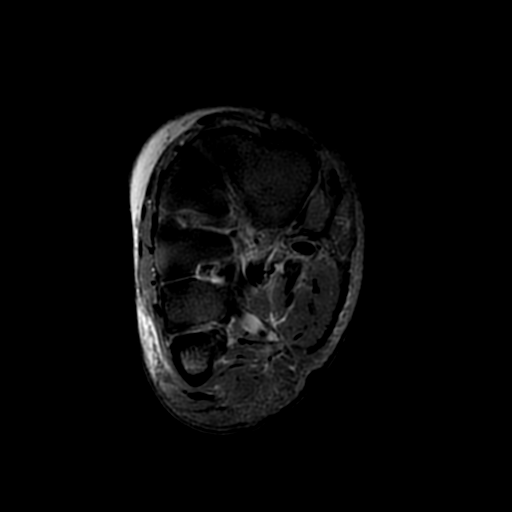
[im 46/46]
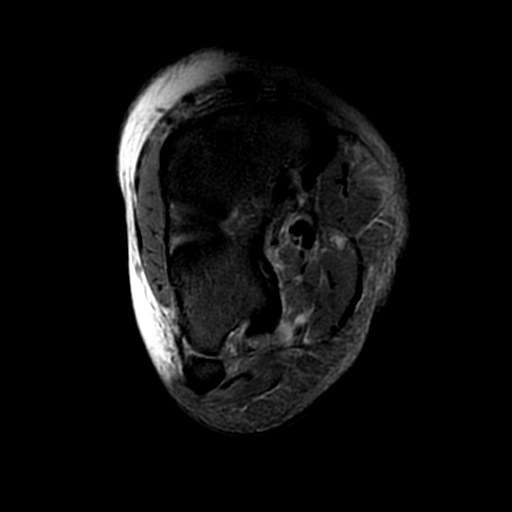

[Series 7: T1 fat-sat · coronal · non-contrast · 3.0mm · 0.27mm/px · 3 of 46 slices shown]
[im 7/46]
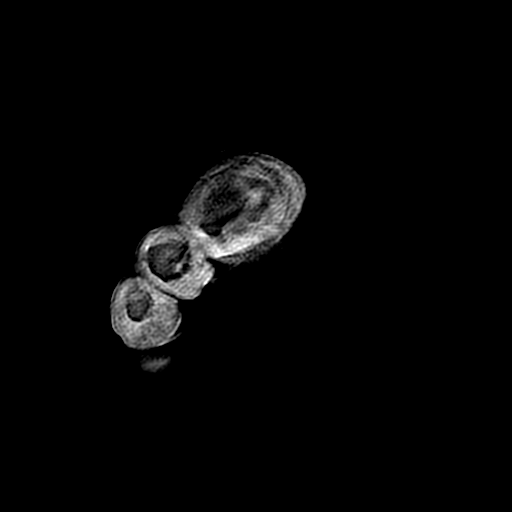
[im 26/46]
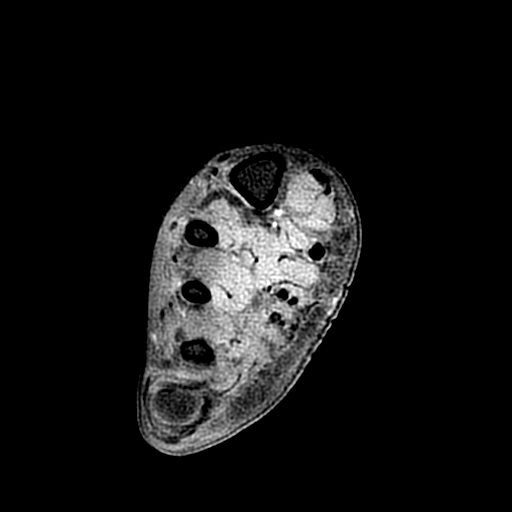
[im 39/46]
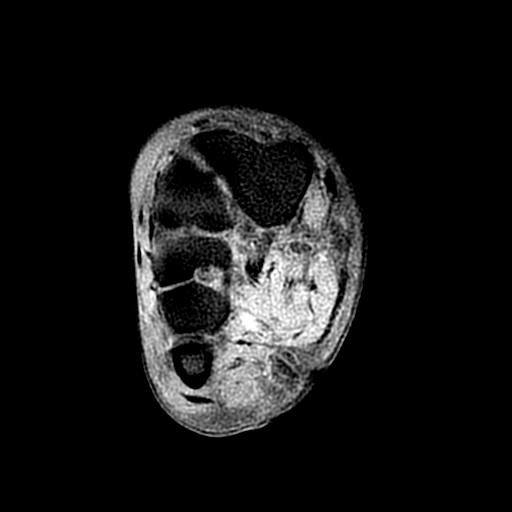

[Series 11: T1 · oblique · 3.0mm · 0.39mm/px · 3 of 24 slices shown (2 of 2)]
[im 1/24]
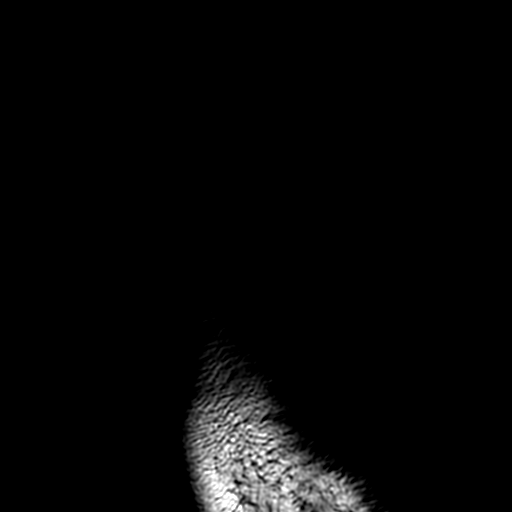
[im 16/24]
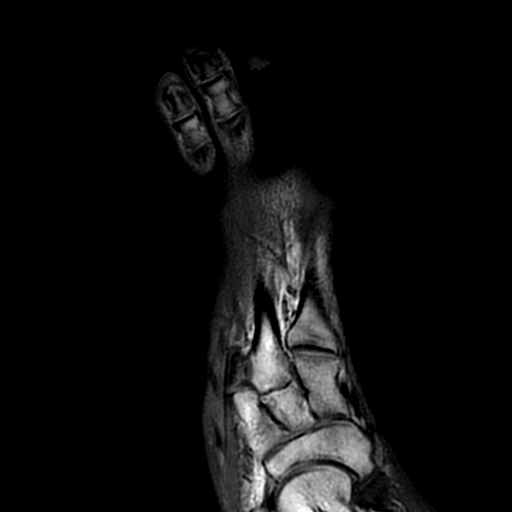
[im 24/24]
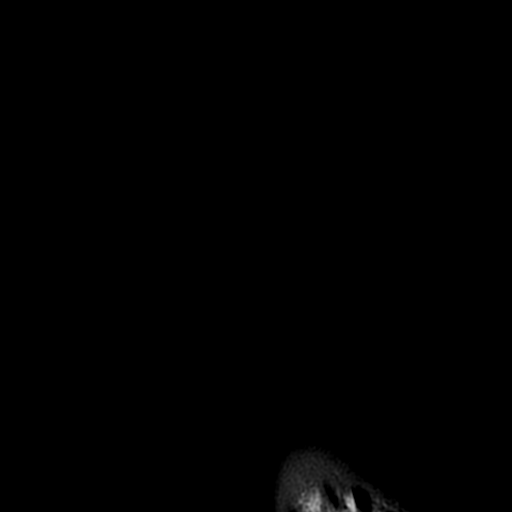

[19 of 40 positions shown; findings below may reference images not displayed]

FINDINGS: Bones/Joint/Cartilage

No marrow signal abnormality. No fracture or dislocation. Normal
alignment. No joint effusion. No periosteal reaction or bone
destruction. No aggressive osseous lesion.

Ligaments

Collateral ligaments are intact.  Lisfranc ligament is intact.

Muscles and Tendons
Flexor, peroneal and extensor compartment tendons are intact.
Muscles are normal.

Soft tissue
Soft tissue wound along the plantar aspect of the midfoot. No
adjacent fluid collection or hematoma. Mild soft tissue edema along
the forefoot which may be secondary to cellulitis versus reactive
edema secondary to venous stasis or fluid overload. No soft tissue
mass.
IMPRESSION: 1. No osteomyelitis of the right forefoot.
2. Soft tissue wound along the plantar aspect of the midfoot. No
fluid collection to suggest an abscess or hematoma.
3. Mild soft tissue edema along the forefoot which may be secondary
to cellulitis versus reactive edema secondary to venous stasis or
fluid overload.

## 2020-06-13 ENCOUNTER — Emergency Department (HOSPITAL_COMMUNITY): Payer: Self-pay

## 2020-06-13 ENCOUNTER — Inpatient Hospital Stay (HOSPITAL_COMMUNITY): Payer: Self-pay

## 2020-06-13 ENCOUNTER — Encounter (HOSPITAL_COMMUNITY): Payer: Self-pay | Admitting: Critical Care Medicine

## 2020-06-13 ENCOUNTER — Inpatient Hospital Stay (HOSPITAL_COMMUNITY)
Admission: EM | Admit: 2020-06-13 | Discharge: 2020-07-08 | DRG: 907 | Disposition: E | Payer: Self-pay | Attending: Internal Medicine | Admitting: Internal Medicine

## 2020-06-13 DIAGNOSIS — T50901A Poisoning by unspecified drugs, medicaments and biological substances, accidental (unintentional), initial encounter: Secondary | ICD-10-CM

## 2020-06-13 DIAGNOSIS — I5021 Acute systolic (congestive) heart failure: Secondary | ICD-10-CM | POA: Diagnosis not present

## 2020-06-13 DIAGNOSIS — M6282 Rhabdomyolysis: Secondary | ICD-10-CM | POA: Diagnosis present

## 2020-06-13 DIAGNOSIS — J690 Pneumonitis due to inhalation of food and vomit: Secondary | ICD-10-CM | POA: Diagnosis present

## 2020-06-13 DIAGNOSIS — E232 Diabetes insipidus: Secondary | ICD-10-CM | POA: Diagnosis not present

## 2020-06-13 DIAGNOSIS — I469 Cardiac arrest, cause unspecified: Secondary | ICD-10-CM | POA: Diagnosis present

## 2020-06-13 DIAGNOSIS — N179 Acute kidney failure, unspecified: Secondary | ICD-10-CM | POA: Diagnosis present

## 2020-06-13 DIAGNOSIS — G8929 Other chronic pain: Secondary | ICD-10-CM | POA: Diagnosis present

## 2020-06-13 DIAGNOSIS — J9601 Acute respiratory failure with hypoxia: Secondary | ICD-10-CM | POA: Diagnosis present

## 2020-06-13 DIAGNOSIS — M549 Dorsalgia, unspecified: Secondary | ICD-10-CM | POA: Diagnosis present

## 2020-06-13 DIAGNOSIS — E8809 Other disorders of plasma-protein metabolism, not elsewhere classified: Secondary | ICD-10-CM | POA: Diagnosis present

## 2020-06-13 DIAGNOSIS — R0902 Hypoxemia: Secondary | ICD-10-CM

## 2020-06-13 DIAGNOSIS — Z20822 Contact with and (suspected) exposure to covid-19: Secondary | ICD-10-CM | POA: Diagnosis present

## 2020-06-13 DIAGNOSIS — J189 Pneumonia, unspecified organism: Secondary | ICD-10-CM | POA: Diagnosis present

## 2020-06-13 DIAGNOSIS — G935 Compression of brain: Secondary | ICD-10-CM | POA: Diagnosis not present

## 2020-06-13 DIAGNOSIS — Z809 Family history of malignant neoplasm, unspecified: Secondary | ICD-10-CM

## 2020-06-13 DIAGNOSIS — G9382 Brain death: Secondary | ICD-10-CM | POA: Diagnosis not present

## 2020-06-13 DIAGNOSIS — G931 Anoxic brain damage, not elsewhere classified: Secondary | ICD-10-CM | POA: Diagnosis present

## 2020-06-13 DIAGNOSIS — K7200 Acute and subacute hepatic failure without coma: Secondary | ICD-10-CM | POA: Diagnosis not present

## 2020-06-13 DIAGNOSIS — F319 Bipolar disorder, unspecified: Secondary | ICD-10-CM | POA: Diagnosis present

## 2020-06-13 DIAGNOSIS — R739 Hyperglycemia, unspecified: Secondary | ICD-10-CM | POA: Diagnosis present

## 2020-06-13 DIAGNOSIS — G40909 Epilepsy, unspecified, not intractable, without status epilepticus: Secondary | ICD-10-CM | POA: Diagnosis present

## 2020-06-13 DIAGNOSIS — E778 Other disorders of glycoprotein metabolism: Secondary | ICD-10-CM | POA: Diagnosis not present

## 2020-06-13 DIAGNOSIS — F17210 Nicotine dependence, cigarettes, uncomplicated: Secondary | ICD-10-CM | POA: Diagnosis present

## 2020-06-13 DIAGNOSIS — Z9911 Dependence on respirator [ventilator] status: Secondary | ICD-10-CM

## 2020-06-13 DIAGNOSIS — I110 Hypertensive heart disease with heart failure: Secondary | ICD-10-CM | POA: Diagnosis present

## 2020-06-13 DIAGNOSIS — T405X1A Poisoning by cocaine, accidental (unintentional), initial encounter: Principal | ICD-10-CM | POA: Diagnosis present

## 2020-06-13 DIAGNOSIS — J9600 Acute respiratory failure, unspecified whether with hypoxia or hypercapnia: Secondary | ICD-10-CM

## 2020-06-13 DIAGNOSIS — Z66 Do not resuscitate: Secondary | ICD-10-CM | POA: Diagnosis not present

## 2020-06-13 DIAGNOSIS — B182 Chronic viral hepatitis C: Secondary | ICD-10-CM | POA: Diagnosis present

## 2020-06-13 DIAGNOSIS — I071 Rheumatic tricuspid insufficiency: Secondary | ICD-10-CM | POA: Diagnosis present

## 2020-06-13 DIAGNOSIS — G936 Cerebral edema: Secondary | ICD-10-CM | POA: Diagnosis not present

## 2020-06-13 DIAGNOSIS — J9602 Acute respiratory failure with hypercapnia: Secondary | ICD-10-CM | POA: Diagnosis present

## 2020-06-13 LAB — COMPREHENSIVE METABOLIC PANEL
ALT: 51 U/L — ABNORMAL HIGH (ref 0–44)
AST: 146 U/L — ABNORMAL HIGH (ref 15–41)
Albumin: 3.2 g/dL — ABNORMAL LOW (ref 3.5–5.0)
Alkaline Phosphatase: 133 U/L — ABNORMAL HIGH (ref 38–126)
Anion gap: 17 — ABNORMAL HIGH (ref 5–15)
BUN: 14 mg/dL (ref 6–20)
CO2: 20 mmol/L — ABNORMAL LOW (ref 22–32)
Calcium: 8.7 mg/dL — ABNORMAL LOW (ref 8.9–10.3)
Chloride: 98 mmol/L (ref 98–111)
Creatinine, Ser: 1.34 mg/dL — ABNORMAL HIGH (ref 0.61–1.24)
GFR calc non Af Amer: >60 mL/min (ref >60)
Glucose, Bld: 349 mg/dL — ABNORMAL HIGH (ref 70–99)
Potassium: 4 mmol/L (ref 3.5–5.1)
Sodium: 135 mmol/L (ref 135–145)
Total Bilirubin: 0.5 mg/dL (ref 0.3–1.2)
Total Protein: 5.9 g/dL — ABNORMAL LOW (ref 6.5–8.1)

## 2020-06-13 LAB — I-STAT ARTERIAL BLOOD GAS, ED
Acid-base deficit: 2 mmol/L (ref 0.0–2.0)
Acid-base deficit: 1 mmol/L (ref 0.0–2.0)
Bicarbonate: 26.3 mmol/L (ref 20.0–28.0)
Bicarbonate: 27.2 mmol/L (ref 20.0–28.0)
Calcium, Ion: 1.17 mmol/L (ref 1.15–1.40)
Calcium, Ion: 1.18 mmol/L (ref 1.15–1.40)
HCT: 45 % (ref 39.0–52.0)
HCT: 43 % (ref 39.0–52.0)
Hemoglobin: 15.3 g/dL (ref 13.0–17.0)
Hemoglobin: 14.6 g/dL (ref 13.0–17.0)
O2 Saturation: 92 %
O2 Saturation: 94 %
Patient temperature: 98.3
Potassium: 3.6 mmol/L (ref 3.5–5.1)
Potassium: 3.8 mmol/L (ref 3.5–5.1)
Sodium: 136 mmol/L (ref 135–145)
Sodium: 137 mmol/L (ref 135–145)
TCO2: 28 mmol/L (ref 22–32)
TCO2: 29 mmol/L (ref 22–32)
pCO2 arterial: 59.8 mmHg — ABNORMAL HIGH (ref 32.0–48.0)
pCO2 arterial: 56.4 mmHg — ABNORMAL HIGH (ref 32.0–48.0)
pH, Arterial: 7.249 — ABNORMAL LOW (ref 7.350–7.450)
pH, Arterial: 7.291 — ABNORMAL LOW (ref 7.350–7.450)
pO2, Arterial: 75 mmHg — ABNORMAL LOW (ref 83.0–108.0)
pO2, Arterial: 83 mmHg (ref 83.0–108.0)

## 2020-06-13 LAB — URINALYSIS, ROUTINE W REFLEX MICROSCOPIC
Bilirubin Urine: NEGATIVE
Glucose, UA: >=500 mg/dL — AB
Ketones, ur: NEGATIVE mg/dL
Leukocytes,Ua: NEGATIVE
Nitrite: NEGATIVE
Protein, ur: 100 mg/dL — AB
Specific Gravity, Urine: 1.011 (ref 1.005–1.030)
pH: 5 (ref 5.0–8.0)

## 2020-06-13 LAB — CBC WITH DIFFERENTIAL/PLATELET
Abs Immature Granulocytes: 0.44 10*3/uL — ABNORMAL HIGH (ref 0.00–0.07)
Basophils Absolute: 0.1 10*3/uL (ref 0.0–0.1)
Basophils Relative: 0 %
Eosinophils Absolute: 0.2 10*3/uL (ref 0.0–0.5)
Eosinophils Relative: 2 %
HCT: 47.7 % (ref 39.0–52.0)
Hemoglobin: 14.1 g/dL (ref 13.0–17.0)
Immature Granulocytes: 4 %
Lymphocytes Relative: 33 %
Lymphs Abs: 4.1 10*3/uL — ABNORMAL HIGH (ref 0.7–4.0)
MCH: 28.3 pg (ref 26.0–34.0)
MCHC: 29.6 g/dL — ABNORMAL LOW (ref 30.0–36.0)
MCV: 95.6 fL (ref 80.0–100.0)
Monocytes Absolute: 0.7 10*3/uL (ref 0.1–1.0)
Monocytes Relative: 6 %
Neutro Abs: 6.7 10*3/uL (ref 1.7–7.7)
Neutrophils Relative %: 55 %
Platelets: 262 10*3/uL (ref 150–400)
RBC: 4.99 MIL/uL (ref 4.22–5.81)
RDW: 12.3 % (ref 11.5–15.5)
WBC: 12.2 10*3/uL — ABNORMAL HIGH (ref 4.0–10.5)
nRBC: 0 % (ref 0.0–0.2)

## 2020-06-13 LAB — ACETAMINOPHEN LEVEL: Acetaminophen (Tylenol), Serum: <10 ug/mL — ABNORMAL LOW (ref 10–30)

## 2020-06-13 LAB — BRAIN NATRIURETIC PEPTIDE: B Natriuretic Peptide: 150 pg/mL — ABNORMAL HIGH (ref 0.0–100.0)

## 2020-06-13 LAB — SALICYLATE LEVEL: Salicylate Lvl: <7 mg/dL — ABNORMAL LOW (ref 7.0–30.0)

## 2020-06-13 LAB — I-STAT CHEM 8, ED
BUN: 16 mg/dL (ref 6–20)
Calcium, Ion: 1.08 mmol/L — ABNORMAL LOW (ref 1.15–1.40)
Chloride: 98 mmol/L (ref 98–111)
Creatinine, Ser: 1 mg/dL (ref 0.61–1.24)
Glucose, Bld: 337 mg/dL — ABNORMAL HIGH (ref 70–99)
HCT: 45 % (ref 39.0–52.0)
Hemoglobin: 15.3 g/dL (ref 13.0–17.0)
Potassium: 4 mmol/L (ref 3.5–5.1)
Sodium: 135 mmol/L (ref 135–145)
TCO2: 22 mmol/L (ref 22–32)

## 2020-06-13 LAB — GLUCOSE, CAPILLARY
Glucose-Capillary: 112 mg/dL — ABNORMAL HIGH (ref 70–99)
Glucose-Capillary: 120 mg/dL — ABNORMAL HIGH (ref 70–99)
Glucose-Capillary: 161 mg/dL — ABNORMAL HIGH (ref 70–99)

## 2020-06-13 LAB — TYPE AND SCREEN
ABO/RH(D): A POS
Antibody Screen: NEGATIVE

## 2020-06-13 LAB — PROTIME-INR
INR: 0.9 (ref 0.8–1.2)
Prothrombin Time: 11.8 seconds (ref 11.4–15.2)

## 2020-06-13 LAB — HEMOGLOBIN A1C
Hgb A1c MFr Bld: 5.6 % (ref 4.8–5.6)
Mean Plasma Glucose: 114.02 mg/dL

## 2020-06-13 LAB — ETHANOL: Alcohol, Ethyl (B): <10 mg/dL (ref <10)

## 2020-06-13 LAB — RAPID URINE DRUG SCREEN, HOSP PERFORMED
Amphetamines: NOT DETECTED
Barbiturates: NOT DETECTED
Benzodiazepines: NOT DETECTED
Cocaine: POSITIVE — AB
Opiates: NOT DETECTED
Tetrahydrocannabinol: NOT DETECTED

## 2020-06-13 LAB — TROPONIN I (HIGH SENSITIVITY)
Troponin I (High Sensitivity): 29 ng/L — ABNORMAL HIGH (ref <18)
Troponin I (High Sensitivity): 567 ng/L (ref <18)
Troponin I (High Sensitivity): 926 ng/L (ref <18)

## 2020-06-13 LAB — MRSA PCR SCREENING: MRSA by PCR: NEGATIVE

## 2020-06-13 LAB — HIV ANTIBODY (ROUTINE TESTING W REFLEX): HIV Screen 4th Generation wRfx: NONREACTIVE

## 2020-06-13 LAB — LACTIC ACID, PLASMA
Lactic Acid, Venous: 8.2 mmol/L (ref 0.5–1.9)
Lactic Acid, Venous: 2.4 mmol/L (ref 0.5–1.9)
Lactic Acid, Venous: 2.4 mmol/L (ref 0.5–1.9)

## 2020-06-13 LAB — CBG MONITORING, ED: Glucose-Capillary: 268 mg/dL — ABNORMAL HIGH (ref 70–99)

## 2020-06-13 LAB — RESPIRATORY PANEL BY RT PCR (FLU A&B, COVID)
Influenza A by PCR: NEGATIVE
Influenza B by PCR: NEGATIVE
SARS Coronavirus 2 by RT PCR: NEGATIVE

## 2020-06-13 MED ORDER — SENNOSIDES-DOCUSATE SODIUM 8.6-50 MG PO TABS: 1.0000 | ORAL_TABLET | Freq: Every evening | ORAL | Status: DC | PRN

## 2020-06-13 MED ORDER — CLONAZEPAM 1 MG PO TABS
2.0000 mg | ORAL_TABLET | Freq: Three times a day (TID) | ORAL | Status: DC
  Administered 2020-06-14 – 2020-06-16 (×8): 2 mg
  Filled 2020-06-13 (×9): qty 2

## 2020-06-13 MED ORDER — VANCOMYCIN HCL IN DEXTROSE 1-5 GM/200ML-% IV SOLN
1000.0000 mg | Freq: Three times a day (TID) | INTRAVENOUS | Status: DC
  Administered 2020-06-14 – 2020-06-17 (×9): 1000 mg via INTRAVENOUS
  Filled 2020-06-13 (×9): qty 200

## 2020-06-13 MED ORDER — POLYETHYLENE GLYCOL 3350 17 G PO PACK: 17.0000 g | PACK | Freq: Every day | ORAL | Status: DC | PRN

## 2020-06-13 MED ORDER — PANTOPRAZOLE SODIUM 40 MG IV SOLR
40.0000 mg | Freq: Two times a day (BID) | INTRAVENOUS | Status: DC
  Administered 2020-06-13 – 2020-06-17 (×8): 40 mg via INTRAVENOUS
  Filled 2020-06-13 (×8): qty 40

## 2020-06-13 MED ORDER — LEVETIRACETAM IN NACL 1000 MG/100ML IV SOLN
1000.0000 mg | Freq: Two times a day (BID) | INTRAVENOUS | Status: DC
  Administered 2020-06-14 – 2020-06-17 (×7): 1000 mg via INTRAVENOUS
  Filled 2020-06-13 (×7): qty 100

## 2020-06-13 MED ORDER — HEPARIN SODIUM (PORCINE) 5000 UNIT/ML IJ SOLN
5000.0000 [IU] | Freq: Three times a day (TID) | INTRAMUSCULAR | Status: DC
  Administered 2020-06-13 – 2020-06-17 (×12): 5000 [IU] via SUBCUTANEOUS
  Filled 2020-06-13 (×12): qty 1

## 2020-06-13 MED ORDER — SODIUM CHLORIDE 0.9 % IV SOLN
3.0000 g | Freq: Three times a day (TID) | INTRAVENOUS | Status: DC
  Administered 2020-06-13 – 2020-06-17 (×13): 3 g via INTRAVENOUS
  Filled 2020-06-13: qty 8
  Filled 2020-06-13: qty 3
  Filled 2020-06-13: qty 8
  Filled 2020-06-13 (×2): qty 3
  Filled 2020-06-13: qty 8
  Filled 2020-06-13: qty 3
  Filled 2020-06-13 (×3): qty 8
  Filled 2020-06-13 (×4): qty 3
  Filled 2020-06-13 (×2): qty 8
  Filled 2020-06-13: qty 3

## 2020-06-13 MED ORDER — ONDANSETRON HCL 4 MG/2ML IJ SOLN: 4.0000 mg | Freq: Four times a day (QID) | INTRAMUSCULAR | Status: DC | PRN

## 2020-06-13 MED ORDER — SODIUM CHLORIDE 0.9 % IV SOLN: INTRAVENOUS | Status: DC | PRN

## 2020-06-13 MED ORDER — SODIUM CHLORIDE 0.9 % IV BOLUS: 1000.0000 mL | Freq: Once | INTRAVENOUS | Status: DC

## 2020-06-13 MED ORDER — ORAL CARE MOUTH RINSE
15.0000 mL | OROMUCOSAL | Status: DC
  Administered 2020-06-13 – 2020-06-17 (×36): 15 mL via OROMUCOSAL

## 2020-06-13 MED ORDER — DOCUSATE SODIUM 50 MG/5ML PO LIQD
100.0000 mg | Freq: Two times a day (BID) | ORAL | Status: DC
  Filled 2020-06-13 (×2): qty 10

## 2020-06-13 MED ORDER — MIDAZOLAM HCL 2 MG/2ML IJ SOLN: 2.0000 mg | INTRAMUSCULAR | Status: DC | PRN

## 2020-06-13 MED ORDER — PANTOPRAZOLE SODIUM 40 MG IV SOLR: 40.0000 mg | Freq: Every day | INTRAVENOUS | Status: DC

## 2020-06-13 MED ORDER — INSULIN ASPART 100 UNIT/ML ~~LOC~~ SOLN
0.0000 [IU] | SUBCUTANEOUS | Status: DC
  Administered 2020-06-14 (×3): 2 [IU] via SUBCUTANEOUS
  Administered 2020-06-15: 3 [IU] via SUBCUTANEOUS
  Administered 2020-06-15 (×3): 2 [IU] via SUBCUTANEOUS
  Administered 2020-06-15: 3 [IU] via SUBCUTANEOUS
  Administered 2020-06-16 – 2020-06-17 (×6): 2 [IU] via SUBCUTANEOUS

## 2020-06-13 MED ORDER — CHLORHEXIDINE GLUCONATE 0.12% ORAL RINSE (MEDLINE KIT)
15.0000 mL | Freq: Two times a day (BID) | OROMUCOSAL | Status: DC
  Administered 2020-06-13 – 2020-06-17 (×8): 15 mL via OROMUCOSAL

## 2020-06-13 MED ORDER — LACTATED RINGERS IV BOLUS
1000.0000 mL | Freq: Once | INTRAVENOUS | Status: AC
  Administered 2020-06-13: 1000 mL via INTRAVENOUS

## 2020-06-13 MED ORDER — PROPOFOL 1000 MG/100ML IV EMUL
0.0000 ug/kg/min | INTRAVENOUS | Status: DC
  Administered 2020-06-13: 10 ug/kg/min via INTRAVENOUS
  Administered 2020-06-14: 15 ug/kg/min via INTRAVENOUS
  Administered 2020-06-15 (×2): 20 ug/kg/min via INTRAVENOUS
  Filled 2020-06-13 (×4): qty 100

## 2020-06-13 MED ORDER — POLYETHYLENE GLYCOL 3350 17 G PO PACK: 17.0000 g | PACK | Freq: Every day | ORAL | Status: DC

## 2020-06-13 MED ORDER — SODIUM CHLORIDE 0.9 % IV SOLN
3000.0000 mg | Freq: Once | INTRAVENOUS | Status: AC
  Administered 2020-06-13: 3000 mg via INTRAVENOUS
  Filled 2020-06-13: qty 30

## 2020-06-13 MED ORDER — VANCOMYCIN HCL 1500 MG/300ML IV SOLN
1500.0000 mg | Freq: Once | INTRAVENOUS | Status: AC
  Administered 2020-06-14: 1500 mg via INTRAVENOUS
  Filled 2020-06-13: qty 300

## 2020-06-13 MED ORDER — ACETAMINOPHEN 325 MG PO TABS: 650.0000 mg | ORAL_TABLET | ORAL | Status: DC | PRN

## 2020-06-13 NOTE — Progress Notes (Signed)
Placed patient on CPAP/PSV mode D/T vent asynchrony. Patient respiratory status is more relaxd now on charted vent settings.

## 2020-06-13 NOTE — Progress Notes (Signed)
CRITICAL VALUE ALERT  Critical Value:  Trop 567  Date & Time Notied: 06-19-2020 1955  Provider Notified: e-Link  Orders Received/Actions taken:

## 2020-06-13 NOTE — ED Notes (Signed)
[facility] notified-  Referral number [phone] Report called to Andruw, RN on [facility]

## 2020-06-13 NOTE — Progress Notes (Signed)
Pharmacy Antibiotic Note  Oscar Burke is a 39 y.o. male admitted on 06/26/2020 with suspected bacteremia.  Pharmacy has been consulted for vancomcyin dosing. Pt has past history for bacteremia and history of polysubstance abuse (IVDU meth, cocaine, heroin). Pt has elevated WBC of 12.2, LA is 8.2. Pt is also s/p cardiac arrest +seizure.   Plan: Vancomycin 1500mg  IV x1 loading dose Vancomycin 1000 IV every 12 hours.  Goal trough 15-20 mcg/mL.  Monitor cbc, renal function, micro data, and clinical progress Monitor vanc trough at steady state as indicated  Height: 6' (182.9 cm) Weight: 75 kg (165 lb 5.5 oz) IBW/kg (Calculated) : 77.6  No data recorded.  Recent Labs  Lab 06/28/2020 1435 06/20/2020 1440 06/24/2020 1452  WBC 12.2*  --   --   CREATININE 1.34*  --  1.00  LATICACIDVEN  --  8.2*  --     Estimated Creatinine Clearance: 103.1 mL/min (by C-G formula based on SCr of 1 mg/dL).    No Known Allergies  Antimicrobials this admission: vanc 10/7> unasyn 10/7>   Microbiology results: 10/7 bcx: sent 10/7 resp panel: negative   Thank you for allowing pharmacy to be a part of this patient's care.   12/7, PharmD PGY1 Pharmacy Resident 06/18/2020 5:23 PM

## 2020-06-13 NOTE — Progress Notes (Signed)
Abnormal ABG results given to Pricilla Loveless, MD. No new orders at this time.

## 2020-06-13 NOTE — Progress Notes (Signed)
STAT EEG complete - results pending. ? ?

## 2020-06-13 NOTE — Progress Notes (Addendum)
eLink Physician-Brief Progress Note Patient Name: Oscar Burke DOB: 05-08-1981 MRN: [id number]   Date of Service  06/29/20  HPI/Events of Note  IVDA with out of hospital cardiac arrest and coma, troponin bump likely secondary to cardiac arrest with CPR, as his history is consistent with drug overdose as etiology of his cardiac arrest.  EKG w/o acute changes, Patient has myoclonic jerking c/w poor neurologic prognosis.  eICU Interventions  Trend troponin, 2 D Echo to look for RWMA.        [name] [name] 06-29-2020, 8:44 PM

## 2020-06-13 NOTE — Progress Notes (Signed)
LTM EEG hooked up and running - no initial skin breakdown - push button tested - neuro notified.  

## 2020-06-13 NOTE — H&P (Signed)
NAME:  Jibreel Fedewa, MRN:  [id number], DOB:  [birth year], LOS: 0 ADMISSION DATE:  2020/06/19, CONSULTATION DATE:  2020-06-19 REFERRING MD:  Dr. Criss Alvine, CHIEF COMPLAINT:  Cardiac arrest   Brief History   39 year old male found down and unresponsive, on EMS arrival patient was seen in pulseless, apneic, and asystolic requiring approximately 10 minutes of CPR with 2 rounds of epinephrine prior to return of spontaneous circulation.  Unknown downtime prior to initiation of CPR.  PCCM consulted for further management and admission.  History of present illness   Gemayel Mascio is a 39 year old male with a past medical history significant for bipolar disease, chronic pain, hepatitis C, polysubstance abuse, and current smoker who was found down and unresponsive.  On EMS arrival patient was seen pulseless, apneic, and asystolic requiring 10 minutes of CPR and 2 rounds of epinephrine before achieving ROSC unknown downtime prior to initiation of CPR.  Patient was intubated on ED arrival.  Vital signs significant on arrival for tachycardia and hypertension.  Majority of lab work pending at time of assessment however abnormalities included lactate of 8.2 glucose of 337, and WBC 12.2.  Initial chest x-ray negative for any acute abnormalities. CT head with questionable decreased attenuation within the left cerebral hemisphere likely related to patient positioning but evolving infarct cannot be excluded.  Past Medical History  Bipolar disease Chronic pain Hepatitis C Polysubstance abuse Current smoker  Significant Hospital Events   Admitted and intubated post cardiac arrest 10/7  Consults:  PCCM Neuro  Procedures:  ETT 10/7 >  Significant Diagnostic Tests:  Chest x-ray 10/7 >negative for any acute abnormalities.   CT head 10/7 > questionable decreased attenuation within the left cerebral hemisphere likely related to patient positioning but evolving infarct cannot be excluded.  Micro Data:   COVID-19/7 BCx pending  Antimicrobials:  Unasyn 10/7- Vanc 10/7-  Interim history/subjective:  Patient intubated, sedated  Objective   Height 6' (1.829 m), weight 75 kg.    Vent Mode: PSV;CPAP FiO2 (%):  [40 %-60 %] 40 % Set Rate:  [14 bmp] 14 bmp Vt Set:  [620 mL] 620 mL PEEP:  [5 cmH20] 5 cmH20 Pressure Support:  [8 cmH20] 8 cmH20  No intake or output data in the 24 hours ending Jun 19, 2020 1522 Filed Weights   Jun 19, 2020 1400  Weight: 75 kg    Examination: General: Intubated HENT: Normocephalic, atraumatic. Pupils sluggish, reactive. Lungs: Clear to auscultation bilaterally. Cardiovascular: Regular rate, rhythm. Systolic murmur present. Abdomen: Soft, non-distended. Normoactive BS. Extremities: Warm, dry. No edema bilaterally. Ankle monitor present. Neuro: Pupils sluggish, reactive. Cough reflex present. Consistent jaw myoclonus. Few episodes of bilateral upper and lower extremity myoclonic jerks with inward movement of bilateral legs. Skin: Track marks bilateral upper extremities.  Resolved Hospital Problem list     Assessment & Plan:   #s/p PEA with ROSC Patient found down unconscious for unknown period of time. Most likely 2/2 hypoxia d/t opiate overdose. ROSC achieved s/p CPR, 2 epi. -GCS 3 on arrival, subsequently intubated -BP 213/154 on arrival, most likely 2/2 epi. MAP now in 90's, will monitor for changes. -Propofol started s/p asynchrony with PRVC mode, subsequently changed to CPAP. -Will d/c propofol for neurological assessment, discuss with RT for pressure-controlled vent settings -Acidotic on ABG, elevated CO2, lactic acid -Trend CBC, BMP, lactic, f/u ABG -Prophylactic unasyn for pneumonia  #Myoclonic jerks Multiple myoclonic jerks seen during assessment. Will discuss with neuro for further recs. -Neuro consulted -EEG pending 213/154  #Hx TV endocarditis #Polysubstance abuse  Patient hospitalized for TV endocarditis 2019, did not complete  treatment and left AMA. Systolic murmur present on exam today. Will rule out bacteremia. -Narcan given in field -BCx pending -TTE pending -Started prophylactic vanc  #AKI Patient unconscious for unknown time w/ cardiac arrest. Most likely pre-renal etiology. -Received 1L NS in ED -Will give 1L LR bolus  #Hyperglycemia No hx of diabetes. Glucose on arrival elevated, most likely stress-induced. -CBG monitoring  #Transaminitis #Hepatitis C Previous liver disease with hep C (dx 2015). Given patient down unknown time, possibility of shock liver. -LFT's elevated from previous draw 05/2019 -Received 1L bolus, will give another -Trend LFT's -Hep C viral load pending  #Bipolar disease  Patient intubated, unconscious. Will monitor.  Best practice:  Diet: NPO Pain/Anxiety/Delirium protocol (if indicated): PAD VAP protocol (if indicated): per protocol DVT prophylaxis: heparin GI prophylaxis: Protonix Glucose control: SSI Mobility: bed Code Status: Full Family Communication: Per EMS, girlfriend intoxicated at house. Unclear of other family. Disposition:   Labs   CBC: Recent Labs  Lab 07/10/20 1435 07/10/20 1452  WBC 12.2*  --   NEUTROABS 6.7  --   HGB 14.1 15.3  HCT 47.7 45.0  MCV 95.6  --   PLT 262  --     Basic Metabolic Panel: Recent Labs  Lab 07/10/2020 1452  NA 135  K 4.0  CL 98  GLUCOSE 337*  BUN 16  CREATININE 1.00   GFR: Estimated Creatinine Clearance: 103.1 mL/min (by C-G formula based on SCr of 1 mg/dL). Recent Labs  Lab 07-10-2020 1435  WBC 12.2*    Liver Function Tests: No results for input(s): AST, ALT, ALKPHOS, BILITOT, PROT, ALBUMIN in the last 168 hours. No results for input(s): LIPASE, AMYLASE in the last 168 hours. No results for input(s): AMMONIA in the last 168 hours.  ABG    Component Value Date/Time   PHART 7.426 12/14/2013 0620   PCO2ART 40.5 12/14/2013 0620   PO2ART 64.6 (L) 12/14/2013 0620   HCO3 25.8 (H) 12/14/2013 0620   TCO2  22 July 10, 2020 1452   O2SAT 92.4 12/14/2013 0620     Coagulation Profile: Recent Labs  Lab 2020/07/10 1435  INR 0.9    Cardiac Enzymes: No results for input(s): CKTOTAL, CKMB, CKMBINDEX, TROPONINI in the last 168 hours.  HbA1C: No results found for: HGBA1C  CBG: No results for input(s): GLUCAP in the last 168 hours.  Review of Systems:   Unable to assess given clinical condition  Past Medical History  He,  has a past medical history of Bipolar 1 disorder (HCC), Chronic back pain, Hepatitis C, Polysubstance abuse (HCC), and Smoker.   Surgical History    Past Surgical History:  Procedure Laterality Date  . ARM AMPUTATION    . MANDIBLE FRACTURE SURGERY    . TEE WITHOUT CARDIOVERSION N/A 06/13/2018   Procedure: TRANSESOPHAGEAL ECHOCARDIOGRAM (TEE);  Surgeon: Wendall Stade, MD;  Location: [facility] ENDOSCOPY;  Service: Cardiovascular;  Laterality: N/A;     Social History   reports that he has been smoking cigarettes. He has been smoking about 1.00 pack per day. He has never used smokeless tobacco. He reports current drug use. Drugs: Cocaine and IV. He reports that he does not drink alcohol.   Family History   His family history includes Cancer in his mother.   Allergies No Known Allergies   Home Medications  Prior to Admission medications   Medication Sig Start Date End Date Taking? Authorizing Provider  Cyclobenzaprine HCl (FLEXERIL PO) Take by mouth.  [provider]  loperamide (IMODIUM) 2 MG capsule Take 4 mg by mouth as needed for diarrhea or loose stools.    [provider]  Naloxone HCl (NARCAN IJ) Inject as directed once.    [provider]     Critical care time: 78 minutes     Evlyn Kanner, MD PGY-1 Internal Medicine [phone]

## 2020-06-13 NOTE — ED Provider Notes (Signed)
[name] EMERGENCY DEPARTMENT Provider Note   CSN: [id number] Arrival date & time: 07/12/2020  1426  LEVEL 5 CAVEAT - UNRESPONSIVE History No chief complaint on file.   Oscar Burke is a 39 y.o. male.  HPI 39 year old male brought in by EMS after being found unresponsive and required CPR.  History is from police as well as EMS.  Patient was facedown on the floor when 1st responders arrived.  Started CPR.  EMS arrival continued CPR and noted him to be pulseless, apneic and asystole.  After CPR and 2 rounds of epinephrine he has had return of spontaneous circulation.  They estimate total CPR time to be 10 min but is unclear how long he has been down.  Longstanding history of polysubstance abuse.  Significant other in the house describes that he uses opiates.  Past Medical History:  Diagnosis Date  . Bipolar 1 disorder (HCC)   . Chronic back pain   . Hepatitis C   . Polysubstance abuse (HCC)   . Smoker     Patient Active Problem List   Diagnosis Date Noted  . Endocarditis 06/29/2018  . MSSA bacteremia   . Empyema lung (HCC)   . Substance induced mood disorder (HCC)   . Abdominal pain 06/08/2018  . Hepatitis C, chronic (HCC) 06/08/2018  . 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 Septic embolism (HCC) 06/08/2018  . Multifocal pneumonia 06/08/2018  . Tobacco abuse 06/08/2018  . Polysubstance abuse (HCC) 04/07/2017  . Leukocytosis 04/07/2017  . Alcohol dependence with unspecified alcohol-induced disorder (HCC)   . Opiate overdose (HCC) 12/13/2013  . Encephalopathy acute 12/13/2013  . Acute on chronic respiratory failure with hypoxia (HCC) 12/13/2013    Past Surgical History:  Procedure Laterality Date  . ARM AMPUTATION    . MANDIBLE FRACTURE SURGERY    . TEE WITHOUT CARDIOVERSION N/A 06/13/2018   Procedure: TRANSESOPHAGEAL ECHOCARDIOGRAM (TEE);  Surgeon: Wendall Stade, MD;  Location: [facility] ENDOSCOPY;  Service: Cardiovascular;  Laterality: N/A;        Family History  Problem Relation Age of Onset  . Cancer Mother     Social History   Tobacco Use  . Smoking status: Current Every Day Smoker    Packs/day: 1.00    Types: Cigarettes  . Smokeless tobacco: Never Used  Substance Use Topics  . Alcohol use: No  . Drug use: Yes    Types: Cocaine, IV    Comment: Heroin, ice, crack, and morphine pills    Home Medications Prior to Admission medications   Medication Sig Start Date End Date Taking? Authorizing Provider  Cyclobenzaprine HCl (FLEXERIL PO) Take by mouth.    [provider]  loperamide (IMODIUM) 2 MG capsule Take 4 mg by mouth as needed for diarrhea or loose stools.    [provider]  Naloxone HCl (NARCAN IJ) Inject as directed once.    [provider]    Allergies    Patient has no known allergies.  Review of Systems   Review of Systems  Unable to perform ROS: Patient unresponsive    Physical Exam Updated Vital Signs Wt 75 kg   BMI 22.42 kg/m   Physical Exam Vitals and nursing note reviewed.  Constitutional:      Appearance: He is well-developed.  HENT:     Head: Normocephalic and atraumatic.     Right Ear: External ear normal.     Left Ear: External ear normal.     Nose: Nose normal.  Eyes:  General:        Right eye: No discharge.        Left eye: No discharge.     Comments: Pupils dilated moderately, minimally reactive  Cardiovascular:     Rate and Rhythm: Regular rhythm. Tachycardia present.     Heart sounds: Normal heart sounds.  Pulmonary:     Breath sounds: Normal breath sounds.     Comments: King airway in place Abdominal:     General: There is no distension.     Palpations: Abdomen is soft.  Musculoskeletal:     Cervical back: Neck supple.  Skin:    General: Skin is warm and dry.     Comments: Multiple track marks  Neurological:     Mental Status: He is unresponsive.     GCS: GCS eye subscore is 1. GCS verbal subscore is 1. GCS motor subscore is  1.  Psychiatric:        Mood and Affect: Mood is not anxious.     ED Results / Procedures / Treatments   Labs (all labs ordered are listed, but only abnormal results are displayed) Labs Reviewed  CBC WITH DIFFERENTIAL/PLATELET - Abnormal; Notable for the following components:      Result Value   WBC 12.2 (*)    MCHC 29.6 (*)    Lymphs Abs 4.1 (*)    Abs Immature Granulocytes 0.44 (*)    All other components within normal limits  LACTIC ACID, PLASMA - Abnormal; Notable for the following components:   Lactic Acid, Venous 8.2 (*)    All other components within normal limits  CBG MONITORING, ED - Abnormal; Notable for the following components:   Glucose-Capillary 268 (*)    All other components within normal limits  I-STAT CHEM 8, ED - Abnormal; Notable for the following components:   Glucose, Bld 337 (*)    Calcium, Ion 1.08 (*)    All other components within normal limits  RESPIRATORY PANEL BY RT PCR (FLU A&B, COVID)  PROTIME-INR  RAPID URINE DRUG SCREEN, HOSP PERFORMED  URINALYSIS, ROUTINE W REFLEX MICROSCOPIC  COMPREHENSIVE METABOLIC PANEL  ETHANOL  BRAIN NATRIURETIC PEPTIDE  LACTIC ACID, PLASMA  ACETAMINOPHEN LEVEL  SALICYLATE LEVEL  I-STAT ARTERIAL BLOOD GAS, ED  CBG MONITORING, ED  TYPE AND SCREEN  TROPONIN I (HIGH SENSITIVITY)    EKG None  Radiology DG Chest Portable 1 View  Result Date: 06/11/2020 CLINICAL DATA:  ETT insertion. EXAM: PORTABLE CHEST 1 VIEW COMPARISON:  05/10/2019 FINDINGS: Single AP view of the chest. Endotracheal tube terminates 5.1 cm above carina. Nasogastric tube extends beyond the inferior aspect of the film. External pacer/defibrillator. The low chest is excluded. Midline trachea. Borderline cardiomegaly. No large pleural effusion. No pneumothorax. No congestive failure. No lobar consolidation. IMPRESSION: Limited exam, with the low chest excluded. Appropriate position of endotracheal tube. No acute cardiopulmonary disease. Electronically  Signed   By: Jeronimo Greaves M.D.   On: 06/09/2020 14:51    Procedures Procedure Name: Intubation Date/Time: 06/21/2020 3:26 PM Performed by: Pricilla Loveless, MD Pre-anesthesia Checklist: Patient identified, Patient being monitored, Emergency Drugs available, Timeout performed and Suction available Oxygen Delivery Method: Ambu bag Preoxygenation: Pre-oxygenation with 100% oxygen Ventilation: Mask ventilation without difficulty Laryngoscope Size: Glidescope and 3 Grade View: Grade II Number of attempts: 1 Airway Equipment and Method: Video-laryngoscopy Placement Confirmation: ETT inserted through vocal cords under direct vision,  CO2 detector and Breath sounds checked- equal and bilateral Secured at: 23 cm Tube secured with: ETT holder Dental  Injury: Teeth and Oropharynx as per pre-operative assessment     .Critical Care Performed by: Pricilla Loveless, MD Authorized by: Pricilla Loveless, MD   Critical care provider statement:    Critical care time (minutes):  45   Critical care time was exclusive of:  Separately billable procedures and treating other patients   Critical care was necessary to treat or prevent imminent or life-threatening deterioration of the following conditions:  Respiratory failure and cardiac failure   Critical care was time spent personally by me on the following activities:  Discussions with consultants, evaluation of patient's response to treatment, examination of patient, ordering and performing treatments and interventions, ordering and review of laboratory studies, ordering and review of radiographic studies, pulse oximetry, re-evaluation of patient's condition, obtaining history from patient or surrogate and review of old charts   (including critical care time)  Medications Ordered in ED Medications  docusate (COLACE) 50 MG/5ML liquid 100 mg (has no administration in time range)  polyethylene glycol (MIRALAX / GLYCOLAX) packet 17 g (has no administration in  time range)  propofol (DIPRIVAN) 1000 MG/100ML infusion (has no administration in time range)    ED Course  I have reviewed the triage vital signs and the nursing notes.  Pertinent labs & imaging results that were available during my care of the patient were reviewed by me and considered in my medical decision making (see chart for details).    MDM Rules/Calculators/A&P                          Patient presents status post cardiac arrest.  Initially his blood pressure is very elevated at over 200/100.  He is also quite tachycardic.  I suspect some of this is post arrest as well as getting to milligrams of epinephrine.  Blood pressure has slowly improved.  After initial intubation with no meds and initial GCS 3 he is starting to breathe on his own and be asynchronous with the ventilator.  Was put on propofol.  Initial work-up does not show an obvious emergent findings such as head bleed.  My suspicion is this is likely drug overdose related.  I have discussed with ICU, Raymon Mutton, and they will admit. Final Clinical Impression(s) / ED Diagnoses Final diagnoses:  Cardiac arrest [facility])    Rx / DC Orders ED Discharge Orders    None       Pricilla Loveless, MD July 01, 2020 1527

## 2020-06-13 NOTE — Progress Notes (Signed)
More frequent myoclonic jerking. Pt with post anoxic myoclonus. Loaded with Keppra and Depakote. Advised 2H RN to start propofol. Also adding Klonopin via tube 2mg  TID.   -- , MD [facility] Pager: [phone] If 7pm to 7am, please call on call as listed on AMION.

## 2020-06-13 NOTE — Progress Notes (Signed)
Assist with transport to CT and back to ED. NO noted respiratory issues at this time.

## 2020-06-13 NOTE — Procedures (Signed)
Patient Name: Oscar Burke  MRN: [id number]  Epilepsy Attending: Charlsie Quest  Referring Physician/Provider: Felicie Morn, PA Date: 06/22/20 Duration: 25.04 mins  Patient history: 39yo M with cardiac arrest. EEG to evaluate for seizure  Level of alertness:  comatose  AEDs during EEG study: None  Technical aspects: This EEG study was done with scalp electrodes positioned according to the 10-20 International system of electrode placement. Electrical activity was acquired at a sampling rate of 500Hz  and reviewed with a high frequency filter of 70Hz  and a low frequency filter of 1Hz . EEG data were recorded continuously and digitally stored.   Description: EEG showed frequent episodes of spontaneous eye opening with axial jerks. Concomitant eeg showed generalized polsypikes consistent with myoclonic seizure. EEG also showed generalized background suppression. Hyperventilation and photic stimulation were not performed.     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  Dr was notifed.   [name] 

## 2020-06-13 NOTE — Progress Notes (Signed)
PT had generalized seizure, full body shaking and drooling. Witnessed by Nurse tech. CCM notified. Neuro notified.

## 2020-06-13 NOTE — Consult Note (Addendum)
Neurology Consultation  Reason for Consult: Myoclonus post cardiac arrest with thought to be 10-minute downtime  Referring Physician: Dr. Chestine Spore  CC: Myoclonus status post cardiac arrest  History is obtained from: EMS and chart  HPI: Oscar Burke is a 39 y.o. male with history of polysubstance abuse, hepatitis C, bipolar 1 disorder.  Patient was brought in by EMS after being found unresponsive and required CPR.  History in the ED was from police as well as EMS.  Patient apparently was face down on the floor when first responders arrived.  Patient had CPR started as on arrival EMS found him pulseless, apneic and asystolic.  Estimated time of CPR was 10 minutes however is unclear how long he was down prior to EMS arrival.  Apparently there was a girlfriend who was a bystander who was unable to give history secondary to apparently being under the influence of substances.  Patient has a longstanding history of polysubstance abuse.  Currently patient is intubated, not breathing over the vent.   Past Medical History:  Diagnosis Date  . Bipolar 1 disorder (HCC)   . Chronic back pain   . Hepatitis C   . Polysubstance abuse (HCC)   . Smoker      Family History  Problem Relation Age of Onset  . Cancer Mother    Social History:   reports that he has been smoking cigarettes. He has been smoking about 1.00 pack per day. He has never used smokeless tobacco. He reports current drug use. Drugs: Cocaine and IV. He reports that he does not drink alcohol.  Medications  Current Facility-Administered Medications:  .  acetaminophen (TYLENOL) tablet 650 mg, 650 mg, Oral, Q4H PRN, Verdene Lennert, MD .  docusate (COLACE) 50 MG/5ML liquid 100 mg, 100 mg, Oral, BID, Pricilla Loveless, MD .  heparin injection 5,000 Units, 5,000 Units, Subcutaneous, Q8H, Verdene Lennert, MD .  ondansetron (ZOFRAN) injection 4 mg, 4 mg, Intravenous, Q6H PRN, Verdene Lennert, MD .  pantoprazole (PROTONIX) injection 40 mg,  40 mg, Intravenous, QHS, Basaraba, Iulia, MD .  polyethylene glycol (MIRALAX / GLYCOLAX) packet 17 g, 17 g, Oral, Daily, Criss Alvine, Scott, MD .  polyethylene glycol (MIRALAX / GLYCOLAX) packet 17 g, 17 g, Oral, Daily PRN, Verdene Lennert, MD .  propofol (DIPRIVAN) 1000 MG/100ML infusion, 0-50 mcg/kg/min, Intravenous, Continuous, Goldston, Scott, MD .  senna-docusate (Senokot-S) tablet 1 tablet, 1 tablet, Per Tube, QHS PRN, Verdene Lennert, MD .  sodium chloride 0.9 % bolus 1,000 mL, 1,000 mL, Intravenous, Once, Pricilla Loveless, MD  Current Outpatient Medications:  .  Cyclobenzaprine HCl (FLEXERIL PO), Take by mouth., Disp: , Rfl:  .  loperamide (IMODIUM) 2 MG capsule, Take 4 mg by mouth as needed for diarrhea or loose stools., Disp: , Rfl:  .  Naloxone HCl (NARCAN IJ), Inject as directed once., Disp: , Rfl:   ROS: Unable to obtain due to intubated and nonresponsive   Exam: Current vital signs: Ht 6' (1.829 m)   Wt 75 kg   BMI 22.42 kg/m  Vital signs in last 24 hours: FiO2 (%):  [40 %-60 %] 40 % (10/07 1450) Weight:  [75 kg] 75 kg (10/07 1400)   Constitutional: Appears well-developed and well-nourished.  Eyes: No scleral injection HENT: No OP obstrucion Head: Normocephalic.  Cardiovascular: Normal rate and regular rhythm.  Respiratory: Intubated GI: Soft.  No distension. There is no tenderness.  Skin: WDI-multiple track marks in the antecubital area bilateral arms  Neuro: Mental Status: Patient does not respond to  verbal stimuli.  Does not respond to deep sternal rub.  Does not follow commands.  No verbalizations noted.  Cranial Nerves: II: patient does not respond confrontation bilaterally,  III,IV,VI: doll's response absent bilaterally. pupils right 2 mm, left 2 mm,and reactive bilaterally V,VII: corneal reflex absent bilaterally. There is sustained clonus of the jaw when mouth is held open by pressing on chin in an oblique-inferior direction VIII: patient does not respond  to verbal stimuli IX,X: gag reflex present XI: trapezius strength unable to test bilaterally XII: tongue strength unable to test Motor: Extremities flaccid throughout.  No spontaneous movement noted except during myoclonus as described below.  No purposeful movements noted.   Sensory: Does not respond to noxious stimuli in any extremity. Deep Tendon Reflexes:  2+ brachioradialis with no knee jerk or ankle jerk.. Sustained clonus bilateral ankles Plantars: absent bilaterally Cerebellar: Unable to perform Other: Myoclonus occurs intermittently at irregular time intervals ranging from 2-5 minutes: Twitching of patient's mandibular region in addition to rapid eye opening x 1 which is not on sustained, in addition to concomitant internal rotation and abduction movements of bilateral lower extremities.  Labs I have reviewed labs in epic and the results pertinent to this consultation are:   CBC    Component Value Date/Time   WBC 12.2 (H) Jun 14, 2020 1435   RBC 4.99 14-Jun-2020 1435   HGB 15.3 06-14-2020 1551   HCT 45.0 06-14-2020 1551   HCT 25.3 (L) 06/19/2018 1124   PLT 262 Jun 14, 2020 1435   MCV 95.6 2020/06/14 1435   MCH 28.3 06/14/20 1435   MCHC 29.6 (L) 14-Jun-2020 1435   RDW 12.3 Jun 14, 2020 1435   LYMPHSABS 4.1 (H) 06-14-2020 1435   MONOABS 0.7 06/14/20 1435   EOSABS 0.2 14-Jun-2020 1435   BASOSABS 0.1 06-14-2020 1435    CMP     Component Value Date/Time   NA 136 June 14, 2020 1551   K 3.6 06-14-20 1551   CL 98 Jun 14, 2020 1452   CO2 20 (L) 06/14/2020 1435   GLUCOSE 337 (H) Jun 14, 2020 1452   BUN 16 06-14-2020 1452   CREATININE 1.00 2020-06-14 1452   CALCIUM 8.7 (L) 06/14/20 1435   PROT 5.9 (L) 2020-06-14 1435   ALBUMIN 3.2 (L) 2020/06/14 1435   AST 146 (H) 06/14/20 1435   ALT 51 (H) 06/14/20 1435   ALKPHOS 133 (H) 06-14-2020 1435   BILITOT 0.5 Jun 14, 2020 1435   GFRNONAA >60 June 14, 2020 1435   GFRAA >60 05/10/2019 1815    Lipid Panel     Component Value  Date/Time   TRIG 112 12/14/2013 1320     Imaging I have reviewed the images obtained:  CT of the head: Somewhat limited exam due the patient tilting in the Gantry. Vague area of decreased attenuation within the left cerebellar hemisphere. This is likely related to patient tilting although an involving infarct cannot be totally excluded.   CT of the cervical spine: No acute bony abnormality noted. Gastric catheter coiled within the oropharynx and nasopharynx as described.  Felicie Morn PA-C [facility] [phone] 14-Jun-2020, 4:53 PM     Assessment:  39 year old male with known polysubstance abuse history found down for an unknown period of time.  Upon EMS arrival patient was found apneic in asystole.  Patient received 10 minutes of CPR before ROSC.  Patient was intubated and brought to the emergency department.  While in the emergency department he was noted to have myoclonic-like activity with blinking eyes, jaw twitching and intermittent lower extremity proximal internal rotation of  hips.  1. Exam as above.   2. Most likely with diffuse anoxic brarin injury resulting in myoclonus. 3. Myoclonus this soon after an anoxic event is an indicator of a poor prognosis   Recommendations: -EEG: Ordered -Will need repeat CT when capable.  --When stable, will need MRI of brain without contrast -We will make recommendations for antiepileptic medication after EEG  Addendum: -- Spot EEG completed. EEG abnormalities: Myoclonic seizure, generalized. Background suppression, generalized. 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 -- Will start LTM -- Loading with Keppra 3000 mg IV followed by scheduled dosing of 1000 mg IV BID.   I have seen and examined the patient. I have formulated the assessment and recommendations. 39 year old male presenting after cardiac and respiratory  arrest at home. While in the emergency department he was noted to have myoclonic-like activity with blinking eyes, jaw twitching and intermittent lower extremity proximal internal rotation of hips. Exam reveals an unresponsive patient with myoclonus. Most likely with diffuse anoxic brarin injury resulting in myoclonus. Myoclonus also seen on EEG. Keppra has been started. Plan is for close observation and LTM EEG.  Electronically signed: Dr. Caryl Pina

## 2020-06-13 NOTE — Progress Notes (Addendum)
eLink Physician-Brief Progress Note Patient Name: Oscar Burke DOB: 11-16-1980 MRN: [id number]   Date of Service  2020/07/02  HPI/Events of Note  Patient with anoxic encephalopathy just had a seizure, he is on cEEG and just received a dose of  Keppra.  eICU Interventions  PRN Benzodiazepine for breakthrough seizures. Notify neurology.        Oscar Burke 07-02-2020, 10:04 PM

## 2020-06-13 NOTE — ED Notes (Signed)
To ED via [facility]- found face down, hx of overdose 2 days ago- , hx of opiate abuse. Multiple fresh track marks on arms,  When Fire dept arrived, pt was pulseless and apneic, given Narcan 1mg  nasally, CPR x 10 minutes total- EMS found pt to be in asystole initially , received Epi x 2- ROSC-  On arrival to ED- pt in NSR, has king airway in, reintubated with 7.5 ETT per Dr. - no sedation needed. IO in right tib.  Received 1000cc NS enroute

## 2020-06-14 ENCOUNTER — Inpatient Hospital Stay (HOSPITAL_COMMUNITY): Payer: Self-pay

## 2020-06-14 DIAGNOSIS — G40409 Other generalized epilepsy and epileptic syndromes, not intractable, without status epilepticus: Secondary | ICD-10-CM

## 2020-06-14 DIAGNOSIS — M6282 Rhabdomyolysis: Secondary | ICD-10-CM

## 2020-06-14 DIAGNOSIS — N179 Acute kidney failure, unspecified: Secondary | ICD-10-CM

## 2020-06-14 DIAGNOSIS — I361 Nonrheumatic tricuspid (valve) insufficiency: Secondary | ICD-10-CM

## 2020-06-14 DIAGNOSIS — T50904A Poisoning by unspecified drugs, medicaments and biological substances, undetermined, initial encounter: Secondary | ICD-10-CM

## 2020-06-14 LAB — ECHOCARDIOGRAM COMPLETE
Area-P 1/2: 6.17 cm2
Calc EF: 41 %
Height: 72 in
S' Lateral: 4.1 cm
Single Plane A2C EF: 38.1 %
Single Plane A4C EF: 42.5 %
Weight: 2645.52 oz

## 2020-06-14 LAB — CBC
HCT: 47.3 % (ref 39.0–52.0)
Hemoglobin: 15.2 g/dL (ref 13.0–17.0)
MCH: 29.1 pg (ref 26.0–34.0)
MCHC: 32.1 g/dL (ref 30.0–36.0)
MCV: 90.4 fL (ref 80.0–100.0)
Platelets: 320 10*3/uL (ref 150–400)
RBC: 5.23 MIL/uL (ref 4.22–5.81)
RDW: 12.4 % (ref 11.5–15.5)
WBC: 28.1 10*3/uL — ABNORMAL HIGH (ref 4.0–10.5)
nRBC: 0 % (ref 0.0–0.2)

## 2020-06-14 LAB — COMPREHENSIVE METABOLIC PANEL
ALT: 49 U/L — ABNORMAL HIGH (ref 0–44)
AST: 83 U/L — ABNORMAL HIGH (ref 15–41)
Albumin: 3.2 g/dL — ABNORMAL LOW (ref 3.5–5.0)
Alkaline Phosphatase: 115 U/L (ref 38–126)
Anion gap: 10 (ref 5–15)
BUN: 12 mg/dL (ref 6–20)
CO2: 25 mmol/L (ref 22–32)
Calcium: 8.6 mg/dL — ABNORMAL LOW (ref 8.9–10.3)
Chloride: 103 mmol/L (ref 98–111)
Creatinine, Ser: 0.94 mg/dL (ref 0.61–1.24)
GFR calc non Af Amer: >60 mL/min (ref >60)
Glucose, Bld: 151 mg/dL — ABNORMAL HIGH (ref 70–99)
Potassium: 4.3 mmol/L (ref 3.5–5.1)
Sodium: 138 mmol/L (ref 135–145)
Total Bilirubin: 0.5 mg/dL (ref 0.3–1.2)
Total Protein: 6.1 g/dL — ABNORMAL LOW (ref 6.5–8.1)

## 2020-06-14 LAB — BASIC METABOLIC PANEL
Anion gap: 9 (ref 5–15)
BUN: 14 mg/dL (ref 6–20)
CO2: 24 mmol/L (ref 22–32)
Calcium: 8.1 mg/dL — ABNORMAL LOW (ref 8.9–10.3)
Chloride: 104 mmol/L (ref 98–111)
Creatinine, Ser: 0.93 mg/dL (ref 0.61–1.24)
GFR calc non Af Amer: >60 mL/min (ref >60)
Glucose, Bld: 130 mg/dL — ABNORMAL HIGH (ref 70–99)
Potassium: 4.8 mmol/L (ref 3.5–5.1)
Sodium: 137 mmol/L (ref 135–145)

## 2020-06-14 LAB — POCT I-STAT 7, (LYTES, BLD GAS, ICA,H+H)
Acid-Base Excess: 3 mmol/L — ABNORMAL HIGH (ref 0.0–2.0)
Bicarbonate: 29.6 mmol/L — ABNORMAL HIGH (ref 20.0–28.0)
Calcium, Ion: 1.16 mmol/L (ref 1.15–1.40)
HCT: 41 % (ref 39.0–52.0)
Hemoglobin: 13.9 g/dL (ref 13.0–17.0)
O2 Saturation: 98 %
Patient temperature: 99.5
Potassium: 5.7 mmol/L — ABNORMAL HIGH (ref 3.5–5.1)
Sodium: 136 mmol/L (ref 135–145)
TCO2: 31 mmol/L (ref 22–32)
pCO2 arterial: 53.6 mmHg — ABNORMAL HIGH (ref 32.0–48.0)
pH, Arterial: 7.351 (ref 7.350–7.450)
pO2, Arterial: 119 mmHg — ABNORMAL HIGH (ref 83.0–108.0)

## 2020-06-14 LAB — PHOSPHORUS: Phosphorus: 1.5 mg/dL — ABNORMAL LOW (ref 2.5–4.6)

## 2020-06-14 LAB — GLUCOSE, CAPILLARY
Glucose-Capillary: 108 mg/dL — ABNORMAL HIGH (ref 70–99)
Glucose-Capillary: 118 mg/dL — ABNORMAL HIGH (ref 70–99)
Glucose-Capillary: 124 mg/dL — ABNORMAL HIGH (ref 70–99)
Glucose-Capillary: 131 mg/dL — ABNORMAL HIGH (ref 70–99)
Glucose-Capillary: 137 mg/dL — ABNORMAL HIGH (ref 70–99)
Glucose-Capillary: 147 mg/dL — ABNORMAL HIGH (ref 70–99)

## 2020-06-14 LAB — TROPONIN I (HIGH SENSITIVITY)
Troponin I (High Sensitivity): 1142 ng/L (ref <18)
Troponin I (High Sensitivity): 1050 ng/L (ref <18)

## 2020-06-14 LAB — CK
Total CK: 641 U/L — ABNORMAL HIGH (ref 49–397)
Total CK: 570 U/L — ABNORMAL HIGH (ref 49–397)

## 2020-06-14 LAB — MAGNESIUM: Magnesium: 1.5 mg/dL — ABNORMAL LOW (ref 1.7–2.4)

## 2020-06-14 LAB — LACTIC ACID, PLASMA
Lactic Acid, Venous: 2.4 mmol/L (ref 0.5–1.9)
Lactic Acid, Venous: 1.2 mmol/L (ref 0.5–1.9)
Lactic Acid, Venous: 1.1 mmol/L (ref 0.5–1.9)

## 2020-06-14 LAB — TRIGLYCERIDES: Triglycerides: 52 mg/dL (ref <150)

## 2020-06-14 MED ORDER — POLYETHYLENE GLYCOL 3350 17 G PO PACK: 17.0000 g | PACK | Freq: Every day | ORAL | Status: DC | PRN

## 2020-06-14 MED ORDER — PROSOURCE TF PO LIQD
45.0000 mL | Freq: Two times a day (BID) | ORAL | Status: DC
  Administered 2020-06-14 – 2020-06-17 (×7): 45 mL
  Filled 2020-06-14 (×7): qty 45

## 2020-06-14 MED ORDER — POLYETHYLENE GLYCOL 3350 17 G PO PACK
17.0000 g | PACK | Freq: Every day | ORAL | Status: DC
  Administered 2020-06-14 – 2020-06-17 (×3): 17 g
  Filled 2020-06-14 (×3): qty 1

## 2020-06-14 MED ORDER — VALPROIC ACID 250 MG/5ML PO SOLN
500.0000 mg | Freq: Three times a day (TID) | ORAL | Status: DC
  Administered 2020-06-14 – 2020-06-16 (×7): 500 mg
  Filled 2020-06-14 (×7): qty 10

## 2020-06-14 MED ORDER — SODIUM CHLORIDE 0.9 % IV BOLUS
500.0000 mL | Freq: Once | INTRAVENOUS | Status: AC
  Administered 2020-06-14: 500 mL via INTRAVENOUS

## 2020-06-14 MED ORDER — FENTANYL 2500MCG IN NS 250ML (10MCG/ML) PREMIX INFUSION
0.0000 ug/h | INTRAVENOUS | Status: DC
  Administered 2020-06-14 (×2): 100 ug/h via INTRAVENOUS
  Filled 2020-06-14 (×2): qty 250

## 2020-06-14 MED ORDER — VITAL HIGH PROTEIN PO LIQD: 1000.0000 mL | ORAL | Status: DC

## 2020-06-14 MED ORDER — VITAL 1.5 CAL PO LIQD
1000.0000 mL | ORAL | Status: DC
  Administered 2020-06-14 – 2020-06-17 (×4): 1000 mL
  Filled 2020-06-14 (×3): qty 1000

## 2020-06-14 MED ORDER — CHLORHEXIDINE GLUCONATE CLOTH 2 % EX PADS
6.0000 | MEDICATED_PAD | Freq: Every day | CUTANEOUS | Status: DC
  Administered 2020-06-14 – 2020-06-16 (×3): 6 via TOPICAL

## 2020-06-14 MED ORDER — MAGNESIUM SULFATE 4 GM/100ML IV SOLN
4.0000 g | Freq: Once | INTRAVENOUS | Status: AC
  Administered 2020-06-14: 4 g via INTRAVENOUS
  Filled 2020-06-14: qty 100

## 2020-06-14 MED ORDER — DEXMEDETOMIDINE HCL IN NACL 400 MCG/100ML IV SOLN
0.4000 ug/kg/h | INTRAVENOUS | Status: DC
  Administered 2020-06-14: 0.4 ug/kg/h via INTRAVENOUS
  Administered 2020-06-14: 1.2 ug/kg/h via INTRAVENOUS
  Administered 2020-06-14 – 2020-06-15 (×3): 1 ug/kg/h via INTRAVENOUS
  Filled 2020-06-14 (×6): qty 100

## 2020-06-14 MED ORDER — ACETAMINOPHEN 325 MG PO TABS
650.0000 mg | ORAL_TABLET | ORAL | Status: DC | PRN
  Administered 2020-06-15 (×2): 650 mg
  Filled 2020-06-14 (×2): qty 2

## 2020-06-14 MED ORDER — SODIUM PHOSPHATES 45 MMOLE/15ML IV SOLN
45.0000 mmol | Freq: Once | INTRAVENOUS | Status: AC
  Administered 2020-06-14: 45 mmol via INTRAVENOUS
  Filled 2020-06-14: qty 15

## 2020-06-14 NOTE — Progress Notes (Addendum)
Subjective: Continues to have myoclonic seizures overnight even after adding Klonopin and Keppra.  ROS: Unable to obtain due to poor mental status  Examination  Vital signs in last 24 hours: Temp:  [98.9 F (37.2 C)-101.8 F (38.8 C)] 99.4 F (37.4 C) (10/08 1114) Pulse Rate:  [80-121] 88 (10/08 0803) Resp:  [8-52] 49 (10/08 0803) BP: (79-213)/(49-154) 114/79 (10/08 0803) SpO2:  [94 %-100 %] 99 % (10/08 0615) FiO2 (%):  [40 %-60 %] 40 % (10/08 0804) Weight:  [75 kg] 75 kg (10/07 1400)  General: lying in bed, not in apparent distress CVS: pulse-normal rate and rhythm RS: breathing comfortably, intubated Extremities: normal, warm Neuro: Pupils equally round and reactive to light, corneal reflex absent, gag reflex present, does not withdraw to noxious stimuli in all 4 extremities, flaccid, has episodes of myoclonic eye opening about every 30 seconds on average.   Basic Metabolic Panel: Recent Labs  Lab 07/09/20 1435 07/09/20 1435 2020-07-09 1452 2020/07/09 1452 2020-07-09 1551 Jul 09, 2020 1720 06/14/20 0052 06/14/20 0353 06/14/20 0953  NA 135   < > 135   < > 136 137 138 136 137  K 4.0   < > 4.0   < > 3.6 3.8 4.3 5.7* 4.8  CL 98  --  98  --   --   --  103  --  104  CO2 20*  --   --   --   --   --  25  --  24  GLUCOSE 349*  --  337*  --   --   --  151*  --  130*  BUN 14  --  16  --   --   --  12  --  14  CREATININE 1.34*  --  1.00  --   --   --  0.94  --  0.93  CALCIUM 8.7*  --   --   --   --   --  8.6*  --  8.1*  MG  --   --   --   --   --   --  1.5*  --   --   PHOS  --   --   --   --   --   --  1.5*  --   --    < > = values in this interval not displayed.    CBC: Recent Labs  Lab 07-09-2020 1435 07-09-20 1435 07/09/20 1452 09-Jul-2020 1551 07/09/2020 1720 06/14/20 0052 06/14/20 0353  WBC 12.2*  --   --   --   --  28.1*  --   NEUTROABS 6.7  --   --   --   --   --   --   HGB 14.1   < > 15.3 15.3 14.6 15.2 13.9  HCT 47.7   < > 45.0 45.0 43.0 47.3 41.0  MCV 95.6  --   --   --    --  90.4  --   PLT 262  --   --   --   --  320  --    < > = values in this interval not displayed.     Coagulation Studies: Recent Labs    2020/07/09 1435  LABPROT 11.8  INR 0.9    Imaging CT head without contrast July 09, 2020: Somewhat limited exam due the patient tilting in the gantry.  Vague area of decreased attenuation within the left cerebellar hemisphere. This is likely related to patient tilting although an  involving infarct cannot be totally excluded. The need for further workup can be determined on a clinical basis.   ASSESSMENT AND PLAN: 39 year old male with polysubstance abuse who presented after cardiac arrest and was noted to have myoclonic seizures.  Myoclonic seizures Suspected anoxic/hypoxic brain injury Cardiac arrest Transaminitis Hypomagnesemia Hypophosphatemia Leukocytosis Hypoproteinemia with hypoalbuminemia Hyperglycemia Lactic acidosis Elevated CK -Patient continues to have myoclonic seizures even after adding Keppra and Klonopin and propofol at 44mcg/hr.  Recommendations -We will add valproic acid 500 mg every 8 hours -Can titrate propofol to 20 mcg/hr if seizures persist -Myoclonic seizures first 24 hours after cardiac arrest is suggestive of severe neurologic injury and poor prognosis.   -We will attempt to contact patient's family to discuss goals of care -Continue seizure precautions -As needed IV Versed for prolonged clinical seizure-like activity -Patient's girlfriend requested a call.  At this point we do not have any other family member therefore I called her.  She states she has been with the patient for 8 years and is is common-law wife.  She did state that patient has a brother in [place] that she is trying to contact but did not very close together.  I discussed patient's current neurologic status with girlfriend.  She states patient would not want to live like this.  -We were able to obtain patient's brother's number from patient's  girlfriend.  His name is Oscar Burke [phone].  I called and spoke with the brother who states that he would want everything done including trach/PEG even if his brother ends up in vegetative state.   -Plan to continue management of seizures and obtain MRI brain without contrast to look for anoxic/hypoxic brain injury at 72 hours (Sunday/Monday) -Updated Dr. Chestine Spore and the team regarding my conversation with the girlfriend and patient's brother  CRITICAL CARE Performed by: Charlsie Quest   Total critical care time: 35 minutes  Critical care time was exclusive of separately billable procedures and treating other patients.  Critical care was necessary to treat or prevent imminent or life-threatening deterioration.  Critical care was time spent personally by me on the following activities: development of treatment plan with patient and/or surrogate as well as nursing, discussions with consultants, evaluation of patient's response to treatment, examination of patient, obtaining history from patient or surrogate, ordering and performing treatments and interventions, ordering and review of laboratory studies, ordering and review of radiographic studies, pulse oximetry and re-evaluation of patient's condition.   Lindie Spruce Epilepsy [facility] For questions after 5pm please refer to AMION to reach the Neurologist on call

## 2020-06-14 NOTE — Progress Notes (Addendum)
NAME:  Oscar Burke, MRN:  [id number], DOB:  [birth year], LOS: 1 ADMISSION DATE:  06/10/2020, CONSULTATION DATE:  06/23/2020 REFERRING MD:  Dr. Criss Alvine, CHIEF COMPLAINT:  Cardiac arrest   Brief History   39 year old male found down and unresponsive, on EMS arrival patient was seen in pulseless, apneic, and asystolic requiring approximately 10 minutes of CPR with 2 rounds of epinephrine prior to return of spontaneous circulation.  Unknown downtime prior to initiation of CPR.  PCCM consulted for further management and admission.  History of present illness   Oscar Burke is a 39 year old male with a past medical history significant for bipolar disease, chronic pain, hepatitis C, polysubstance abuse, and current smoker who was found down and unresponsive.  On EMS arrival patient was seen pulseless, apneic, and asystolic requiring 10 minutes of CPR and 2 rounds of epinephrine before achieving ROSC unknown downtime prior to initiation of CPR.  Patient was intubated on ED arrival.  Vital signs significant on arrival for tachycardia and hypertension.  Majority of lab work pending at time of assessment however abnormalities included lactate of 8.2 glucose of 337, and WBC 12.2.  Initial chest x-ray negative for any acute abnormalities. CT head with questionable decreased attenuation within the left cerebral hemisphere likely related to patient positioning but evolving infarct cannot be excluded.  Past Medical History  Bipolar disease Chronic pain Hepatitis C Polysubstance abuse Current smoker  Significant Hospital Events     Consults:  PCCM Neuro  Procedures:  ETT 10/7 >  Significant Diagnostic Tests:  Chest x-ray 10/7 >negative for any acute abnormalities.   CT head 10/7 > questionable decreased attenuation within the left cerebral hemisphere likely related to patient positioning but evolving infarct cannot be excluded.  TTE: LVEF 35-40%, tricuspid vegetation  Micro Data:    COVID-19/7 BCx pending  Antimicrobials:  Unasyn 10/7- Vanc 10/7-  Interim history/subjective:  Intubated, sedated, ongoing myoclonus despite sedation. Severely increased RR without sedation.  Objective   Blood pressure 114/79, pulse 88, temperature 99.4 F (37.4 C), temperature source Oral, resp. rate (!) 49, height 6' (1.829 m), weight 75 kg, SpO2 99 %.    Vent Mode: PCV FiO2 (%):  [40 %] 40 % Set Rate:  [20 bmp] 20 bmp PEEP:  [5 cmH20] 5 cmH20 Plateau Pressure:  [15 cmH20-16 cmH20] 16 cmH20   Intake/Output Summary (Last 24 hours) at 06/14/2020 1502 Last data filed at 06/14/2020 0600 Gross per 24 hour  Intake 832.08 ml  Output 1900 ml  Net -1067.92 ml   Filed Weights   06/09/2020 1400  Weight: 75 kg    Examination: General: intubated, sedated on propofol and fentanyl HENT: [place]/AT, eyes anicteric Lungs: CTAB, minimal tracheal secretions. Cardiovascular: RRR Abdomen: Soft, nontender, nondistended Extremities: Warm, no pitting edema.  No clubbing or cyanosis Neuro: Eyes in fixed upward gaze.  Periodic blinking due to myoclonus about every 30 seconds.  Pupils small and reactive.  Negative corneal and doll's eyes reflexes.  No response to painful stimulation in any extremity.  No response to verbal stimulation. Derm: Track marks   [facility] Problem list     Assessment & Plan:   PEA arrest - found down unconscious for unknown period of time. Due to hypoxia d/t opiate overdose.  CPR, 2 epi. 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 -Requires ongoing mechanical ventilation for airway protection -Continue prophylactic Unasyn given concern for aspiration  while down  Acute hypoxic vent dependent respiratory failure post cardiac arrest -Low tidal volume  ventilation -VAP prevention protocol -Wean PEEP and FiO2 per ARDS protocol. -Daily SAT and SBT when appropriate.  Currently requiring sedation. to suppress myoclonus and mental status precludes extubation.  Myoclonic seizures -Appreciate neurology's assistance.  Continue propofol for sedation.  Adding -Adding valproic acid -Continuous EEG  Hx TV endocarditis Polysubstance abuse -Continue empiric vancomycin and beta-lactam -Blood cultures pending  AKI Rhabdomyolysis -Maintain euvolemia -Continue to monitor -Strict I's/O -Renally dose meds and avoid nephrotoxic meds -Serial CK monitoring  Hyperglycemia No hx of diabetes. Glucose on arrival elevated, most likely stress-induced. -Accu-Cheks with sliding scale insulin as needed -Goal BG 140-180 while admitted to the ICU if requiring insulin encephalopathy  Elevated transaminase levels History of chronic Hepatitis C Previous liver disease with hep C (dx 2015). Given patient down unknown time, likely shock liver. -Continue to monitor LFTs -Trend LFT's -Hep C viral load pending  GoC -Brother Onalee Hua updated over the phone.  He indicated that for right now his brother would not want everything done, but would not want long-term SNF, trach, PEG.  He lost his other brother and sister in 2019 and 2020 due to drug overdoses.  There are no other living family members. He is currently working [employer] and gets spotty cell reception but has requested we leave messages if he is unavailable.   Best practice:  Diet: NPO Pain/Anxiety/Delirium protocol (if indicated): PAD VAP protocol (if indicated): per protocol DVT prophylaxis: heparin GI prophylaxis: Protonix Glucose control: SSI Mobility: bed Code Status: Full Family Communication: brother Onalee Hua updated over the phone. Disposition:   Labs   CBC: Recent Labs  Lab 07/03/2020 1435 06/15/2020 1435 06/12/2020 1452 07/05/2020 1551 06/07/2020 1720 06/14/20 0052 06/14/20 0353  WBC 12.2*  --    --   --   --  28.1*  --   NEUTROABS 6.7  --   --   --   --   --   --   HGB 14.1   < > 15.3 15.3 14.6 15.2 13.9  HCT 47.7   < > 45.0 45.0 43.0 47.3 41.0  MCV 95.6  --   --   --   --  90.4  --   PLT 262  --   --   --   --  320  --    < > = values in this interval not displayed.    Basic Metabolic Panel: Recent Labs  Lab 06/12/2020 1435 06/15/2020 1435 06/29/2020 1452 06/28/2020 1452 06/11/2020 1551 06/11/2020 1720 06/14/20 0052 06/14/20 0353 06/14/20 0953  NA 135   < > 135   < > 136 137 138 136 137  K 4.0   < > 4.0   < > 3.6 3.8 4.3 5.7* 4.8  CL 98  --  98  --   --   --  103  --  104  CO2 20*  --   --   --   --   --  25  --  24  GLUCOSE 349*  --  337*  --   --   --  151*  --  130*  BUN 14  --  16  --   --   --  12  --  14  CREATININE 1.34*  --  1.00  --   --   --  0.94  --  0.93  CALCIUM 8.7*  --   --   --   --   --  8.6*  --  8.1*  MG  --   --   --   --   --   --  1.5*  --   --   PHOS  --   --   --   --   --   --  1.5*  --   --    < > = values in this interval not displayed.   GFR: Estimated Creatinine Clearance: 110.9 mL/min (by C-G formula based on SCr of 0.93 mg/dL). Recent Labs  Lab 06/28/20 1435 06/28/20 1440 Jun 28, 2020 1842 Jun 28, 2020 2208 06/14/20 0052  WBC 12.2*  --   --   --  28.1*  LATICACIDVEN  --  8.2* 2.4* 2.4* 2.4*    Liver Function Tests: Recent Labs  Lab 06-28-20 1435 06/14/20 0052  AST 146* 83*  ALT 51* 49*  ALKPHOS 133* 115  BILITOT 0.5 0.5  PROT 5.9* 6.1*  ALBUMIN 3.2* 3.2*   No results for input(s): LIPASE, AMYLASE in the last 168 hours. No results for input(s): AMMONIA in the last 168 hours.  ABG    Component Value Date/Time   PHART 7.351 06/14/2020 0353   PCO2ART 53.6 (H) 06/14/2020 0353   PO2ART 119 (H) 06/14/2020 0353   HCO3 29.6 (H) 06/14/2020 0353   TCO2 31 06/14/2020 0353   ACIDBASEDEF 1.0 28-Jun-2020 1720   O2SAT 98.0 06/14/2020 0353     Coagulation Profile: Recent Labs  Lab 06/28/20 1435  INR 0.9    Cardiac Enzymes: Recent  Labs  Lab 06/14/20 0953  CKTOTAL 641*    HbA1C: Hgb A1c MFr Bld  Date/Time Value Ref Range Status  28-Jun-2020 10:08 PM 5.6 4.8 - 5.6 % Final    Comment:    (NOTE) Pre diabetes:          5.7%-6.4%  Diabetes:              >6.4%  Glycemic control for   <7.0% adults with diabetes     CBG: Recent Labs  Lab 2020/06/28 2103 06-28-2020 2357 06/14/20 0409 06/14/20 0752 06/14/20 1116  GLUCAP 120* 112* 108* 118* 131*     This patient is critically ill with multiple organ system failure which requires frequent high complexity decision making, assessment, support, evaluation, and titration of therapies. This was completed through the application of advanced monitoring technologies and extensive interpretation of multiple databases. During this encounter critical care time was devoted to patient care services described in this note for 45 minutes.  Steffanie Dunn, DO 06/14/20 3:36 PM [facility]

## 2020-06-14 NOTE — Procedures (Addendum)
Patient Name: Oscar Burke  MRN: [id number]  Epilepsy Attending: Charlsie Quest  Referring Physician/Provider:  Dr. Caryl Pina Duration:  2020/06/28 1832 to 06/14/2020 1832  Patient history: 39yo M with cardiac arrest. EEG to evaluate for seizure  Level of alertness:  comatose  AEDs during EEG study: None  Technical aspects: This EEG study was done with scalp electrodes positioned according to the 10-20 International system of electrode placement. Electrical activity was acquired at a sampling rate of 500Hz  and reviewed with a high frequency filter of 70Hz  and a low frequency filter of 1Hz . EEG data were recorded continuously and digitally stored.    Description: EEG initially showed frequent episodes of spontaneous eye opening with axial jerks, avg per 30 secs. Concomitant eeg showed high amplitude generalized sharply contoured 3-5hz  thet-delta slowing consistent with myoclonic seizure.  After 1:30 AM on 06/14/2020, the frequency of myoclonic seizures improved but again started worsening after around 4 AM. EEG also showed generalized background suppression between myoclonic jerks.  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   Koray Soter 

## 2020-06-14 NOTE — Progress Notes (Signed)
eLink Physician-Brief Progress Note Patient Name: Oscar Burke DOB: 12-08-1980 MRN: [id number]   Date of Service  06/14/2020  HPI/Events of Note  Patient is tachypnic and tachycardic likely reflecting acute withdrawal from his polysubstance abuse drugs.  eICU Interventions  Will start Precedex and low dose Fentanyl infusions to combat withdrawal        Jacquelinne Speak U Nohealani Medinger 06/14/2020, 1:01 AM

## 2020-06-14 NOTE — Progress Notes (Signed)
LTM maint complete - no skin breakdown under: Fp1 F3 A1  

## 2020-06-14 NOTE — Progress Notes (Signed)
eLink Physician-Brief Progress Note Patient Name: Oscar Burke DOB: Dec 01, 1980 MRN: [id number]   Date of Service  06/14/2020  HPI/Events of Note  Patient with irregular rapid breathing which is unchanged with switch from PCV to [facility] and even Pressure Support, likely neurogenic in origin given his history.  eICU Interventions  Will check a portable CXR just to exclude  Pulmonary issues.        Oscar Burke 06/14/2020, 3:54 AM

## 2020-06-14 NOTE — Progress Notes (Signed)
eLink Physician-Brief Progress Note Patient Name: Oscar Burke DOB: 1980/12/12 MRN: [id number]   Date of Service  06/14/2020  HPI/Events of Note  BP 87/58  eICU Interventions  NS 500 ml iv fluid bolus x 1        Aurther Harlin U Analea Muller 06/14/2020, 6:46 AM

## 2020-06-14 NOTE — Progress Notes (Addendum)
Initial Nutrition Assessment  DOCUMENTATION CODES:   Not applicable  INTERVENTION:   ADDENDUM (6761): Consult received for tube feeding initiation and management. RD will order the tube feeding recommendations below.  Tube feeding recommendations: - Vital 1.5 @ 55 ml/hr (1320 ml/day) via OG tube - ProSource TF 45 ml BID  Recommended tube feeding regimen would provide 2060 kcal, 111 grams of protein, and 1008 ml of H2O.   NUTRITION DIAGNOSIS:   Inadequate oral intake related to inability to eat as evidenced by NPO status.  GOAL:   Patient will meet greater than or equal to 90% of their needs  MONITOR:   Vent status, Labs, Weight trends, I & O's  REASON FOR ASSESSMENT:   Ventilator    ASSESSMENT:   39 year old male who presented to the ED on 10/07 s/p cardiac arrest. PMH of substance abuse, previous endocarditis, hepatitis C, bipolar 1 disorder.   Discussed pt with RN and during ICU rounds today.  Per notes, plan is to continue management of seizures and obtain MRI of brain without contrast to look for anoxic/hypoxic brain injury at 72 hours.  OG tube in place, currently to low intermittent suction. RD to leave TF recommendations if needed.  Unable to obtain diet and weight history at this time. Reviewed weight history available in chart. Current weight consistent weight weight from 13 months ago.  Patient is currently intubated on ventilator support MV: 13.0 L/min Temp (24hrs), Avg:99.7 F (37.6 C), Min:98.7 F (37.1 C), Max:101.8 F (38.8 C)  Drips: Precedex Fentanyl  Medications reviewed and include: SSI, protonix, miralax, IV abx, IV sodium phosphate 45 mmol once  Labs reviewed: phosphorus 1.5, magnesium 1.5, elevated LFTs CBG's: 108-161 x 24 hours  UOP: 1650 ml x 24 hours OGT output: 250 ml x 24 hours I/O's: -1.0 L since admit  NUTRITION - FOCUSED PHYSICAL EXAM:    Most Recent Value  Orbital Region No depletion  Upper Arm Region No depletion   Thoracic and Lumbar Region Mild depletion  Buccal Region Unable to assess  Temple Region No depletion  Clavicle Bone Region Mild depletion  Clavicle and Acromion Bone Region Mild depletion  Scapular Bone Region Severe depletion  Dorsal Hand No depletion  Patellar Region No depletion  Anterior Thigh Region No depletion  Posterior Calf Region No depletion  Edema (RD Assessment) None  Hair Reviewed  Eyes Unable to assess  Mouth Unable to assess  Skin Reviewed  Nails Reviewed       Diet Order:   Diet Order            Diet NPO time specified  Diet effective now                 EDUCATION NEEDS:   No education needs have been identified at this time  Skin:  Skin Assessment: Reviewed RN Assessment  Last BM:  no documented BM  Height:   Ht Readings from Last 1 Encounters:  06/26/2020 6' (1.829 m)    Weight:   Wt Readings from Last 1 Encounters:  06/16/2020 75 kg    Ideal Body Weight:  80.9 kg  BMI:  Body mass index is 22.42 kg/m.  Estimated Nutritional Needs:   Kcal:  2098  Protein:  105-125 grams  Fluid:  >/= 2.0 L    Earma Reading, MS, RD, LDN Inpatient Clinical Dietitian Please see AMi[facility] for contact information.

## 2020-06-14 NOTE — Progress Notes (Signed)
  Echocardiogram 2D Echocardiogram has been performed.  Oscar Burke 06/14/2020, 9:28 AM

## 2020-06-15 ENCOUNTER — Inpatient Hospital Stay (HOSPITAL_COMMUNITY): Payer: Self-pay

## 2020-06-15 DIAGNOSIS — G935 Compression of brain: Secondary | ICD-10-CM

## 2020-06-15 LAB — GLUCOSE, CAPILLARY
Glucose-Capillary: 158 mg/dL — ABNORMAL HIGH (ref 70–99)
Glucose-Capillary: 132 mg/dL — ABNORMAL HIGH (ref 70–99)
Glucose-Capillary: 135 mg/dL — ABNORMAL HIGH (ref 70–99)
Glucose-Capillary: 151 mg/dL — ABNORMAL HIGH (ref 70–99)
Glucose-Capillary: 132 mg/dL — ABNORMAL HIGH (ref 70–99)

## 2020-06-15 LAB — HCV RNA QUANT
HCV Quantitative Log: 6.199 log10 IU/mL (ref >1.70)
HCV Quantitative: 1580000 IU/mL (ref >50)

## 2020-06-15 LAB — CK: Total CK: 193 U/L (ref 49–397)

## 2020-06-15 LAB — CBC
HCT: 38.6 % — ABNORMAL LOW (ref 39.0–52.0)
Hemoglobin: 12.1 g/dL — ABNORMAL LOW (ref 13.0–17.0)
MCH: 28.5 pg (ref 26.0–34.0)
MCHC: 31.3 g/dL (ref 30.0–36.0)
MCV: 90.8 fL (ref 80.0–100.0)
Platelets: 194 10*3/uL (ref 150–400)
RBC: 4.25 MIL/uL (ref 4.22–5.81)
RDW: 12.8 % (ref 11.5–15.5)
WBC: 15.3 10*3/uL — ABNORMAL HIGH (ref 4.0–10.5)
nRBC: 0 % (ref 0.0–0.2)

## 2020-06-15 LAB — COMPREHENSIVE METABOLIC PANEL
ALT: 28 U/L (ref 0–44)
AST: 49 U/L — ABNORMAL HIGH (ref 15–41)
Albumin: 2.4 g/dL — ABNORMAL LOW (ref 3.5–5.0)
Alkaline Phosphatase: 71 U/L (ref 38–126)
Anion gap: 7 (ref 5–15)
BUN: 18 mg/dL (ref 6–20)
CO2: 31 mmol/L (ref 22–32)
Calcium: 8.3 mg/dL — ABNORMAL LOW (ref 8.9–10.3)
Chloride: 102 mmol/L (ref 98–111)
Creatinine, Ser: 0.8 mg/dL (ref 0.61–1.24)
GFR, Estimated: >60 mL/min (ref >60)
Glucose, Bld: 122 mg/dL — ABNORMAL HIGH (ref 70–99)
Potassium: 4.4 mmol/L (ref 3.5–5.1)
Sodium: 140 mmol/L (ref 135–145)
Total Bilirubin: 0.4 mg/dL (ref 0.3–1.2)
Total Protein: 5.2 g/dL — ABNORMAL LOW (ref 6.5–8.1)

## 2020-06-15 LAB — PHOSPHORUS: Phosphorus: 2.1 mg/dL — ABNORMAL LOW (ref 2.5–4.6)

## 2020-06-15 LAB — TRIGLYCERIDES: Triglycerides: 80 mg/dL (ref <150)

## 2020-06-15 LAB — MAGNESIUM: Magnesium: 2 mg/dL (ref 1.7–2.4)

## 2020-06-15 MED ORDER — FENTANYL CITRATE (PF) 100 MCG/2ML IJ SOLN
25.0000 ug | INTRAMUSCULAR | Status: DC | PRN
  Administered 2020-06-15: 50 ug via INTRAVENOUS
  Filled 2020-06-15: qty 2

## 2020-06-15 MED ORDER — DEXMEDETOMIDINE HCL IN NACL 400 MCG/100ML IV SOLN
0.4000 ug/kg/h | INTRAVENOUS | Status: DC
  Administered 2020-06-15: 0.4 ug/kg/h via INTRAVENOUS
  Administered 2020-06-16: 0.2 ug/kg/h via INTRAVENOUS
  Filled 2020-06-15 (×2): qty 100

## 2020-06-15 NOTE — Progress Notes (Signed)
Patient transported to CT and returned to 2H03 without complications. Vitals stable throughout. RT will continue to monitor.

## 2020-06-15 NOTE — Progress Notes (Signed)
eLink Physician-Brief Progress Note Patient Name: Oscar Burke DOB: 08/10/1981 MRN: [id number]   Date of Service  06/15/2020  HPI/Events of Note  Patient with anoxic encephalopathy with concern for impending herniation, he is hypertensive and tachycardic.  eICU Interventions  Precedex infusion + PRN Fentanyl for comfort ordered.        [name] Nicanor Mendolia 06/15/2020, 11:18 PM

## 2020-06-15 NOTE — Procedures (Addendum)
Patient Name:Oscar Burke EQA:[id number] Epilepsy Attending:Odesser Tourangeau Annabelle Harman Referring Physician/Provider: Dr. Caryl Pina Duration: 06/14/2020 1832 to 06/15/2020 1844  Patient history:39yo M with cardiac arrest. EEG to evaluate for seizure  Level of alertness:comatose  AEDs during EEG study:LEV, Clonopin, Propofol, VPA  Technical aspects: This EEG study was done with scalp electrodes positioned according to the 10-20 International system of electrode placement. Electrical activity was acquired at a sampling rate of 500Hz  and reviewed with a high frequency filter of 70Hz  and a low frequency filter of 1Hz . EEG data were recorded continuously and digitally stored.    Description:EEG initially showed busts suppression pattern with 1-3 second bursts of high amplitude generalized 5-6Hz  sharply contoured theta slowing  as well as generalized suppression lasting 10-15 seconds. At times during bursts, patient was noted to have spontaneous eye opening consistent with myoclonic seizure. EEG was not reactive to noxious stimulation. Gradually periods of suppression became longer and bursts of slowing became shorter, less frequent and lower amplitude.   ABNORMALITY -Myoclonic seizure, generalized - Burst suppression, generalized  IMPRESSION: This studyinitially showed myoclonic seizures which gradually resolved. 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 EEG appears to be worsening compared to previous day    [name] 

## 2020-06-15 NOTE — Progress Notes (Addendum)
Subjective: Pt remains comatose without brainstem reflexes and R<L pupil. CTH pending this am  Objective: Current vital signs: BP 135/84   Pulse 72   Temp (!) 100.9 F (38.3 C) (Oral)   Resp 20   Ht 6' (1.829 m)   Wt 87 kg   SpO2 100%   BMI 26.01 kg/m  Vital signs in last 24 hours: Temp:  [98.3 F (36.8 C)-101.8 F (38.8 C)] 100.9 F (38.3 C) (10/09 0352) Pulse Rate:  [72-89] 72 (10/09 0256) Resp:  [0-49] 20 (10/09 0700) BP: (103-145)/(66-100) 135/84 (10/09 0700) SpO2:  [96 %-100 %] 100 % (10/09 0700) FiO2 (%):  [30 %-40 %] 30 % (10/09 0256) Weight:  [87 kg] 87 kg (10/09 0500)  Intake/Output from previous day: 10/08 0701 - 10/09 0700 In: 2613 [I.V.:803.7; NG/GT:343.8; IV Piggyback:1465.5] Out: 1750 [Urine:1700; Emesis/NG output:50] Intake/Output this shift: No intake/output data recorded. Nutritional status:  Diet Order            Diet NPO time specified  Diet effective now                General Exam: General: lying in bed, critically ill CVS: pulse-normal rate and rhythm RS: vent support, intubated Extremities: normal, warm  Neurological Exam:  No eye opening or movement to any noxious stim. Right pupil is 49mm, Left is 56mm, neither are reactive to light. No cough. No Gag. No OCR. No blink. Flaccid, no myoclonic movements seen today.  Lab Results: Results for orders placed or performed during the hospital encounter of 06/29/2020 (from the past 48 hour(s))  Comprehensive metabolic panel     Status: Abnormal   Collection Time: 07/04/2020  2:35 PM  Result Value Ref Range   Sodium 135 135 - 145 mmol/L   Potassium 4.0 3.5 - 5.1 mmol/L   Chloride 98 98 - 111 mmol/L   CO2 20 (L) 22 - 32 mmol/L   Glucose, Bld 349 (H) 70 - 99 mg/dL    Comment: Glucose reference range applies only to samples taken after fasting for at least 8 hours.   BUN 14 6 - 20 mg/dL   Creatinine, Ser 1.34 (H) 0.61 - 1.24 mg/dL   Calcium 8.7 (L) 8.9 - 10.3 mg/dL   Total Protein 5.9 (L) 6.5 -  8.1 g/dL   Albumin 3.2 (L) 3.5 - 5.0 g/dL   AST 146 (H) 15 - 41 U/L   ALT 51 (H) 0 - 44 U/L   Alkaline Phosphatase 133 (H) 38 - 126 U/L   Total Bilirubin 0.5 0.3 - 1.2 mg/dL   GFR calc non Af Amer >60 >60 mL/min   Anion gap 17 (H) 5 - 15    Comment: Performed at [facility] [address] [place], [place] [address]  Ethanol     Status: None   Collection Time: 06/30/2020  2:35 PM  Result Value Ref Range   Alcohol, Ethyl (B) <10 <10 mg/dL    Comment: (NOTE) Lowest detectable limit for serum alcohol is 10 mg/dL.  For medical purposes only. Performed at [facility], [place] [address], [place], [place] [address]   Brain natriuretic peptide     Status: Abnormal   Collection Time: 06/28/2020  2:35 PM  Result Value Ref Range   B Natriuretic Peptide 150.0 (H) 0.0 - 100.0 pg/mL    Comment: Performed at [facility] [address] [place], [place] [address]  CBC with Differential     Status: Abnormal  Collection Time: 06/21/2020  2:35 PM  Result Value Ref Range   WBC 12.2 (H) 4.0 - 10.5 K/uL   RBC 4.99 4.22 - 5.81 MIL/uL   Hemoglobin 14.1 13.0 - 17.0 g/dL   HCT 47.7 39 - 52 %   MCV 95.6 80.0 - 100.0 fL   MCH 28.3 26.0 - 34.0 pg   MCHC 29.6 (L) 30.0 - 36.0 g/dL   RDW 12.3 11.5 - 15.5 %   Platelets 262 150 - 400 K/uL   nRBC 0.0 0.0 - 0.2 %   Neutrophils Relative % 55 %   Neutro Abs 6.7 1.7 - 7.7 K/uL   Lymphocytes Relative 33 %   Lymphs Abs 4.1 (H) 0.7 - 4.0 K/uL   Monocytes Relative 6 %   Monocytes Absolute 0.7 0.1 - 1.0 K/uL   Eosinophils Relative 2 %   Eosinophils Absolute 0.2 0 - 0 K/uL   Basophils Relative 0 %   Basophils Absolute 0.1 0 - 0 K/uL   Immature Granulocytes 4 %   Abs Immature Granulocytes 0.44 (H) 0.00 - 0.07 K/uL    Comment: Performed at [facility], [address], [place], [place] [address]  Troponin I (High Sensitivity)     Status: Abnormal   Collection Time: 07/05/2020  2:35 PM  Result Value Ref Range   Troponin I (High Sensitivity)  29 (H) <18 ng/L    Comment: (NOTE) Elevated high sensitivity troponin I (hsTnI) values and significant  changes across serial measurements may suggest ACS but many other  chronic and acute conditions are known to elevate hsTnI results.  Refer to the "Links" section for chest pain algorithms and additional  guidance. Performed at [facility], [place] [address] [place], [place] [address]   Acetaminophen level     Status: Abnormal   Collection Time: 06/27/2020  2:35 PM  Result Value Ref Range   Acetaminophen (Tylenol), Serum <10 (L) 10 - 30 ug/mL    Comment: (NOTE) Therapeutic concentrations vary significantly. A range of 10-30 ug/mL  may be an effective concentration for many patients. However, some  are best treated at concentrations outside of this range. Acetaminophen concentrations >150 ug/mL at 4 hours after ingestion  and >50 ug/mL at 12 hours after ingestion are often associated with  toxic reactions.  Performed at [facility], [place] [address], [place], [place] [address]   Salicylate level     Status: Abnormal   Collection Time: 07/01/2020  2:35 PM  Result Value Ref Range   Salicylate Lvl <7.4 (L) 7.0 - 30.0 mg/dL    Comment: Performed at [facility] [address] [place], [place] [address]  Protime-INR     Status: None   Collection Time: 06/26/2020  2:35 PM  Result Value Ref Range   Prothrombin Time 11.8 11.4 - 15.2 seconds   INR 0.9 0.8 - 1.2    Comment: (NOTE) INR goal varies based on device and disease states. Performed at [facility], [place] [address], [place], [place] [address]   Lactic acid, plasma     Status: Abnormal   Collection Time: 06/12/2020  2:40 PM  Result Value Ref Range   Lactic Acid, Venous 8.2 (HH) 0.5 - 1.9 mmol/L    Comment: CRITICAL RESULT CALLED TO, READ BACK BY AND VERIFIED WITH: K.COBB RN 1522 07/05/2020 MCCORMICK K Performed at [facility] [address], [place], [place] [address]   Respiratory Panel by RT PCR  (  Flu A&B, Covid) - Nasopharyngeal Swab     Status: None   Collection Time: 06/14/2020  2:40 PM   Specimen: Nasopharyngeal Swab  Result Value Ref Range   SARS Coronavirus 2 by RT PCR NEGATIVE NEGATIVE    Comment: (NOTE) SARS-CoV-2 target nucleic acids are NOT DETECTED.  The SARS-CoV-2 RNA is generally detectable in upper respiratoy specimens during the acute phase of infection. The lowest concentration of SARS-CoV-2 viral copies this assay can detect is 131 copies/mL. A negative result does not preclude SARS-Cov-2 infection and should not be used as the sole basis for treatment or other patient management decisions. A negative result may occur with  improper specimen collection/handling, submission of specimen other than nasopharyngeal swab, presence of viral mutation(s) within the areas targeted by this assay, and inadequate number of viral copies (<131 copies/mL). A negative result must be combined with clinical observations, patient history, and epidemiological information. The expected result is Negative.  Fact Sheet for Patients:  PinkCheek.be  Fact Sheet for Healthcare Providers:  GravelBags.it  This test is no t yet approved or cleared by the [place] FDA and  has been authorized for detection and/or diagnosis of SARS-CoV-2 by FDA under an Emergency Use Authorization (EUA). This EUA will remain  in effect (meaning this test can be used) for the duration of the COVID-19 declaration under Section 564(b)(1) of the Act, 21 U.S.C. section 360bbb-3(b)(1), unless the authorization is terminated or revoked sooner.     Influenza A by PCR NEGATIVE NEGATIVE   Influenza B by PCR NEGATIVE NEGATIVE    Comment: (NOTE) The Xpert Xpress SARS-CoV-2/FLU/RSV assay is intended as an aid in  the diagnosis of influenza from Nasopharyngeal swab specimens and  should not be used as a sole basis for treatment. Nasal washings and   aspirates are unacceptable for Xpert Xpress SARS-CoV-2/FLU/RSV  testing.  Fact Sheet for Patients: PinkCheek.be  Fact Sheet for Healthcare Providers: GravelBags.it  This test is not yet approved or cleared by the [place] FDA and  has been authorized for detection and/or diagnosis of SARS-CoV-2 by  FDA under an Emergency Use Authorization (EUA). This EUA will remain  in effect (meaning this test can be used) for the duration of the  Covid-19 declaration under Section 564(b)(1) of the Act, 21  U.S.C. section 360bbb-3(b)(1), unless the authorization is  terminated or revoked. Performed at [facility], [place] [address], [place], [place] [address]   I-stat chem 8, ED (not at [facility] or [facility])     Status: Abnormal   Collection Time: 07/05/2020  2:52 PM  Result Value Ref Range   Sodium 135 135 - 145 mmol/L   Potassium 4.0 3.5 - 5.1 mmol/L   Chloride 98 98 - 111 mmol/L   BUN 16 6 - 20 mg/dL   Creatinine, Ser 1.00 0.61 - 1.24 mg/dL   Glucose, Bld 337 (H) 70 - 99 mg/dL    Comment: Glucose reference range applies only to samples taken after fasting for at least 8 hours.   Calcium, Ion 1.08 (L) 1.15 - 1.40 mmol/L   TCO2 22 22 - 32 mmol/L   Hemoglobin 15.3 13.0 - 17.0 g/dL   HCT 45.0 39 - 52 %  CBG monitoring, ED     Status: Abnormal   Collection Time: 06/12/2020  3:21 PM  Result Value Ref Range   Glucose-Capillary 268 (H) 70 - 99 mg/dL    Comment: Glucose reference range applies only to samples taken after fasting for at  least 8 hours.   Comment 1 Notify RN    Comment 2 Document in Chart   I-Stat arterial blood gas, ED     Status: Abnormal   Collection Time: 06/22/2020  3:51 PM  Result Value Ref Range   pH, Arterial 7.249 (L) 7.35 - 7.45   pCO2 arterial 59.8 (H) 32 - 48 mmHg   pO2, Arterial 75 (L) 83 - 108 mmHg   Bicarbonate 26.3 20.0 - 28.0 mmol/L   TCO2 28 22 - 32 mmol/L   O2 Saturation 92.0 %   Acid-base deficit 2.0  0.0 - 2.0 mmol/L   Sodium 136 135 - 145 mmol/L   Potassium 3.6 3.5 - 5.1 mmol/L   Calcium, Ion 1.17 1.15 - 1.40 mmol/L   HCT 45.0 39 - 52 %   Hemoglobin 15.3 13.0 - 17.0 g/dL   Patient temperature 98.3 F    Collection site Radial    Drawn by RT    Sample type ARTERIAL   I-Stat arterial blood gas, ED     Status: Abnormal   Collection Time: 06/08/2020  5:20 PM  Result Value Ref Range   pH, Arterial 7.291 (L) 7.35 - 7.45   pCO2 arterial 56.4 (H) 32 - 48 mmHg   pO2, Arterial 83 83 - 108 mmHg   Bicarbonate 27.2 20.0 - 28.0 mmol/L   TCO2 29 22 - 32 mmol/L   O2 Saturation 94.0 %   Acid-base deficit 1.0 0.0 - 2.0 mmol/L   Sodium 137 135 - 145 mmol/L   Potassium 3.8 3.5 - 5.1 mmol/L   Calcium, Ion 1.18 1.15 - 1.40 mmol/L   HCT 43.0 39 - 52 %   Hemoglobin 14.6 13.0 - 17.0 g/dL   Collection site Radial    Drawn by RT    Sample type ARTERIAL   Urine rapid drug screen (hosp performed)     Status: Abnormal   Collection Time: 06/08/2020  6:10 PM  Result Value Ref Range   Opiates NONE DETECTED NONE DETECTED   Cocaine POSITIVE (A) NONE DETECTED   Benzodiazepines NONE DETECTED NONE DETECTED   Amphetamines NONE DETECTED NONE DETECTED   Tetrahydrocannabinol NONE DETECTED NONE DETECTED   Barbiturates NONE DETECTED NONE DETECTED    Comment: (NOTE) DRUG SCREEN FOR MEDICAL PURPOSES ONLY.  IF CONFIRMATION IS NEEDED FOR ANY PURPOSE, NOTIFY LAB WITHIN 5 DAYS.  LOWEST DETECTABLE LIMITS FOR URINE DRUG SCREEN Drug Class                     Cutoff (ng/mL) Amphetamine and metabolites    1000 Barbiturate and metabolites    200 Benzodiazepine                 867 Tricyclics and metabolites     300 Opiates and metabolites        300 Cocaine and metabolites        300 THC                            50 Performed at [facility], [place] [address], [place], [place] [address]   Urinalysis, Routine w reflex microscopic Urine, Catheterized     Status: Abnormal   Collection Time: 07/03/2020  6:10 PM   Result Value Ref Range   Color, Urine YELLOW YELLOW   APPearance CLOUDY (A) CLEAR   Specific Gravity, Urine 1.011 1.005 - 1.030   pH 5.0 5.0 - 8.0  Glucose, UA >=500 (A) NEGATIVE mg/dL   Hgb urine dipstick SMALL (A) NEGATIVE   Bilirubin Urine NEGATIVE NEGATIVE   Ketones, ur NEGATIVE NEGATIVE mg/dL   Protein, ur 100 (A) NEGATIVE mg/dL   Nitrite NEGATIVE NEGATIVE   Leukocytes,Ua NEGATIVE NEGATIVE   RBC / HPF 0-5 0 - 5 RBC/hpf   WBC, UA 6-10 0 - 5 WBC/hpf   Bacteria, UA RARE (A) NONE SEEN   Mucus PRESENT    Sperm, UA PRESENT     Comment: Performed at [facility] [address], [place], [place] [address]  MRSA PCR Screening     Status: None   Collection Time: 06/12/2020  6:10 PM   Specimen: Nasopharyngeal  Result Value Ref Range   MRSA by PCR NEGATIVE NEGATIVE    Comment:        The GeneXpert MRSA Assay (FDA approved for NASAL specimens only), is one component of a comprehensive MRSA colonization surveillance program. It is not intended to diagnose MRSA infection nor to guide or monitor treatment for MRSA infections. Performed at [facility], [place] [address], [place], [place] [address]   HIV Antibody (routine testing w rflx)     Status: None   Collection Time: 06/20/2020  6:42 PM  Result Value Ref Range   HIV Screen 4th Generation wRfx Non Reactive Non Reactive    Comment: Performed at [facility], [place] [address], [place], [place] [address]  Lactic acid, plasma     Status: Abnormal   Collection Time: 06/16/2020  6:42 PM  Result Value Ref Range   Lactic Acid, Venous 2.4 (HH) 0.5 - 1.9 mmol/L    Comment: CRITICAL VALUE NOTED.  VALUE IS CONSISTENT WITH PREVIOUSLY REPORTED AND CALLED VALUE. Performed at [facility], [place] [address] [place], [place] [address]   Troponin I (High Sensitivity)     Status: Abnormal   Collection Time: 06/12/2020  6:42 PM  Result Value Ref Range   Troponin I (High Sensitivity) 567 (HH) <18 ng/L    Comment: CRITICAL RESULT  CALLED TO, READ BACK BY AND VERIFIED WITH: A.[place] 06/29/2020 MCCORMICK K (NOTE) Elevated high sensitivity troponin I (hsTnI) values and significant  changes across serial measurements may suggest ACS but many other  chronic and acute conditions are known to elevate hsTnI results.  Refer to the Links section for chest pain algorithms and additional  guidance. Performed at [facility], [place] [address] [place], [place] [address]   Culture, blood (routine x 2)     Status: None (Preliminary result)   Collection Time: 06/18/2020  6:42 PM   Specimen: BLOOD LEFT ARM  Result Value Ref Range   Specimen Description BLOOD LEFT ARM    Special Requests AEROBIC BOTTLE ONLY Blood Culture adequate volume    Culture      NO GROWTH 2 DAYS Performed at [facility], [place] [address] [place], [place] [address]    Report Status PENDING   Type and screen [place]     Status: None   Collection Time: 07/04/2020  6:43 PM  Result Value Ref Range   ABO/RH(D) A POS    Antibody Screen NEG    Sample Expiration      06/16/2020,2359 Performed at [facility], [place] [address], [place], [place] [address]   Culture, blood (routine x 2)     Status: None (Preliminary result)   Collection Time: 07/03/2020  6:43 PM  Specimen: BLOOD LEFT HAND  Result Value Ref Range   Specimen Description BLOOD LEFT HAND    Special Requests      BOTTLES DRAWN AEROBIC AND ANAEROBIC Blood Culture adequate volume   Culture      NO GROWTH 2 DAYS Performed at [facility], [address], [place], [place] [address]    Report Status PENDING   Glucose, capillary     Status: Abnormal   Collection Time: 06/16/2020  6:50 PM  Result Value Ref Range   Glucose-Capillary 161 (H) 70 - 99 mg/dL    Comment: Glucose reference range applies only to samples taken after fasting for at least 8 hours.  Glucose, capillary     Status: Abnormal   Collection Time: 06/11/2020  9:03 PM  Result Value Ref Range    Glucose-Capillary 120 (H) 70 - 99 mg/dL    Comment: Glucose reference range applies only to samples taken after fasting for at least 8 hours.  Lactic acid, plasma     Status: Abnormal   Collection Time: 06/21/2020 10:08 PM  Result Value Ref Range   Lactic Acid, Venous 2.4 (HH) 0.5 - 1.9 mmol/L    Comment: CRITICAL VALUE NOTED.  VALUE IS CONSISTENT WITH PREVIOUSLY REPORTED AND CALLED VALUE. Performed at [facility], [place] [address] [place], [place] [address]   Hemoglobin A1c     Status: None   Collection Time: 06/15/2020 10:08 PM  Result Value Ref Range   Hgb A1c MFr Bld 5.6 4.8 - 5.6 %    Comment: (NOTE) Pre diabetes:          5.7%-6.4%  Diabetes:              >6.4%  Glycemic control for   <7.0% adults with diabetes    Mean Plasma Glucose 114.02 mg/dL    Comment: Performed at [facility] [address], [place], [place] [address]  Troponin I (High Sensitivity)     Status: Abnormal   Collection Time: 06/30/2020 10:08 PM  Result Value Ref Range   Troponin I (High Sensitivity) 926 (HH) <18 ng/L    Comment: CRITICAL VALUE NOTED.  VALUE IS CONSISTENT WITH PREVIOUSLY REPORTED AND CALLED VALUE. (NOTE) Elevated high sensitivity troponin I (hsTnI) values and significant  changes across serial measurements may suggest ACS but many other  chronic and acute conditions are known to elevate hsTnI results.  Refer to the Links section for chest pain algorithms and additional  guidance. Performed at [facility], [place] [address], [place], [place] [address]   Glucose, capillary     Status: Abnormal   Collection Time: 06/27/2020 11:57 PM  Result Value Ref Range   Glucose-Capillary 112 (H) 70 - 99 mg/dL    Comment: Glucose reference range applies only to samples taken after fasting for at least 8 hours.  Triglycerides     Status: None   Collection Time: 06/14/20 12:52 AM  Result Value Ref Range   Triglycerides 52 <150 mg/dL    Comment: Performed at [facility] [address], [place], [place] [address]  Magnesium     Status: Abnormal   Collection Time: 06/14/20 12:52 AM  Result Value Ref Range   Magnesium 1.5 (L) 1.7 - 2.4 mg/dL    Comment: Performed at [facility] [address], [place],  [address]  Phosphorus     Status: Abnormal   Collection Time: 06/14/20 12:52 AM  Result Value Ref  Range   Phosphorus 1.5 (L) 2.5 - 4.6 mg/dL    Comment: Performed at [facility] [address] [place] [address]  CBC     Status: Abnormal   Collection Time: 06/14/20 12:52 AM  Result Value Ref Range   WBC 28.1 (H) 4.0 - 10.5 K/uL   RBC 5.23 4.22 - 5.81 MIL/uL   Hemoglobin 15.2 13.0 - 17.0 g/dL   HCT 47.3 39 - 52 %   MCV 90.4 80.0 - 100.0 fL   MCH 29.1 26.0 - 34.0 pg   MCHC 32.1 30.0 - 36.0 g/dL   RDW 12.4 11.5 - 15.5 %   Platelets 320 150 - 400 K/uL   nRBC 0.0 0.0 - 0.2 %    Comment: Performed at [facility], [place] [address], [place], [place] [address]  Comprehensive metabolic panel     Status: Abnormal   Collection Time: 06/14/20 12:52 AM  Result Value Ref Range   Sodium 138 135 - 145 mmol/L   Potassium 4.3 3.5 - 5.1 mmol/L   Chloride 103 98 - 111 mmol/L   CO2 25 22 - 32 mmol/L   Glucose, Bld 151 (H) 70 - 99 mg/dL    Comment: Glucose reference range applies only to samples taken after fasting for at least 8 hours.   BUN 12 6 - 20 mg/dL   Creatinine, Ser 0.94 0.61 - 1.24 mg/dL   Calcium 8.6 (L) 8.9 - 10.3 mg/dL   Total Protein 6.1 (L) 6.5 - 8.1 g/dL   Albumin 3.2 (L) 3.5 - 5.0 g/dL   AST 83 (H) 15 - 41 U/L   ALT 49 (H) 0 - 44 U/L   Alkaline Phosphatase 115 38 - 126 U/L   Total Bilirubin 0.5 0.3 - 1.2 mg/dL   GFR calc non Af Amer >60 >60 mL/min   Anion gap 10 5 - 15    Comment: Performed at [facility], [place] [address], [place], [place] [address]  Lactic acid, plasma     Status: Abnormal   Collection Time: 06/14/20 12:52 AM  Result Value Ref Range   Lactic Acid, Venous 2.4 (HH) 0.5 - 1.9 mmol/L     Comment: CRITICAL VALUE NOTED.  VALUE IS CONSISTENT WITH PREVIOUSLY REPORTED AND CALLED VALUE. Performed at [facility], [place] [address], [place], [place] [address]   Troponin I (High Sensitivity)     Status: Abnormal   Collection Time: 06/14/20 12:52 AM  Result Value Ref Range   Troponin I (High Sensitivity) 1,142 (HH) <18 ng/L    Comment: CRITICAL VALUE NOTED.  VALUE IS CONSISTENT WITH PREVIOUSLY REPORTED AND CALLED VALUE. (NOTE) Elevated high sensitivity troponin I (hsTnI) values and significant  changes across serial measurements may suggest ACS but many other  chronic and acute conditions are known to elevate hsTnI results.  Refer to the Links section for chest pain algorithms and additional  guidance. Performed at [facility], [place] [address] [place], [place] [address]   I-STAT 7, (LYTES, BLD GAS, ICA, H+H)     Status: Abnormal   Collection Time: 06/14/20  3:53 AM  Result Value Ref Range   pH, Arterial 7.351 7.35 - 7.45   pCO2 arterial 53.6 (H) 32 - 48 mmHg   pO2, Arterial 119 (H) 83 - 108 mmHg   Bicarbonate 29.6 (H) 20.0 - 28.0 mmol/L   TCO2 31 22 - 32 mmol/L   O2 Saturation 98.0 %   Acid-Base  Excess 3.0 (H) 0.0 - 2.0 mmol/L   Sodium 136 135 - 145 mmol/L   Potassium 5.7 (H) 3.5 - 5.1 mmol/L   Calcium, Ion 1.16 1.15 - 1.40 mmol/L   HCT 41.0 39 - 52 %   Hemoglobin 13.9 13.0 - 17.0 g/dL   Patient temperature 99.5 F    Collection site Radial    Drawn by Operator    Sample type ARTERIAL   Glucose, capillary     Status: Abnormal   Collection Time: 06/14/20  4:09 AM  Result Value Ref Range   Glucose-Capillary 108 (H) 70 - 99 mg/dL    Comment: Glucose reference range applies only to samples taken after fasting for at least 8 hours.  Glucose, capillary     Status: Abnormal   Collection Time: 06/14/20  7:52 AM  Result Value Ref Range   Glucose-Capillary 118 (H) 70 - 99 mg/dL    Comment: Glucose reference range applies only to samples taken after fasting for at  least 8 hours.  Basic metabolic panel     Status: Abnormal   Collection Time: 06/14/20  9:53 AM  Result Value Ref Range   Sodium 137 135 - 145 mmol/L   Potassium 4.8 3.5 - 5.1 mmol/L   Chloride 104 98 - 111 mmol/L   CO2 24 22 - 32 mmol/L   Glucose, Bld 130 (H) 70 - 99 mg/dL    Comment: Glucose reference range applies only to samples taken after fasting for at least 8 hours.   BUN 14 6 - 20 mg/dL   Creatinine, Ser 0.93 0.61 - 1.24 mg/dL   Calcium 8.1 (L) 8.9 - 10.3 mg/dL   GFR calc non Af Amer >60 >60 mL/min   Anion gap 9 5 - 15    Comment: Performed at [facility] [address], [place], [place] [address]  CK     Status: Abnormal   Collection Time: 06/14/20  9:53 AM  Result Value Ref Range   Total CK 641 (H) 49.0 - 397.0 U/L    Comment: Performed at [facility], [place] [address] [place], [place] [address]  Glucose, capillary     Status: Abnormal   Collection Time: 06/14/20 11:16 AM  Result Value Ref Range   Glucose-Capillary 131 (H) 70 - 99 mg/dL    Comment: Glucose reference range applies only to samples taken after fasting for at least 8 hours.  Glucose, capillary     Status: Abnormal   Collection Time: 06/14/20  3:14 PM  Result Value Ref Range   Glucose-Capillary 137 (H) 70 - 99 mg/dL    Comment: Glucose reference range applies only to samples taken after fasting for at least 8 hours.  Troponin I (High Sensitivity)     Status: Abnormal   Collection Time: 06/14/20  3:57 PM  Result Value Ref Range   Troponin I (High Sensitivity) 1,050 (HH) <18 ng/L    Comment: CRITICAL VALUE NOTED.  VALUE IS CONSISTENT WITH PREVIOUSLY REPORTED AND CALLED VALUE. (NOTE) Elevated high sensitivity troponin I (hsTnI) values and significant  changes across serial measurements may suggest ACS but many other  chronic and acute conditions are known to elevate hsTnI results.  Refer to the Links section for chest pain algorithms and additional  guidance. Performed at [facility], [place] [address], [place], [place] [address]   Lactic acid, plasma     Status: None   Collection Time: 06/14/20  3:57 PM  Result Value  Ref Range   Lactic Acid, Venous 1.2 0.5 - 1.9 mmol/L    Comment: Performed at [facility], [address] [place], [place] [address]  CK     Status: Abnormal   Collection Time: 06/14/20  3:57 PM  Result Value Ref Range   Total CK 570 (H) 49.0 - 397.0 U/L    Comment: Performed at [facility], [address], [place], [place] [address]  Lactic acid, plasma     Status: None   Collection Time: 06/14/20  7:17 PM  Result Value Ref Range   Lactic Acid, Venous 1.1 0.5 - 1.9 mmol/L    Comment: Performed at [facility], [address] [place], [place] [address]  Glucose, capillary     Status: Abnormal   Collection Time: 06/14/20  7:33 PM  Result Value Ref Range   Glucose-Capillary 124 (H) 70 - 99 mg/dL    Comment: Glucose reference range applies only to samples taken after fasting for at least 8 hours.  Glucose, capillary     Status: Abnormal   Collection Time: 06/14/20 11:34 PM  Result Value Ref Range   Glucose-Capillary 147 (H) 70 - 99 mg/dL    Comment: Glucose reference range applies only to samples taken after fasting for at least 8 hours.  Phosphorus     Status: Abnormal   Collection Time: 06/15/20 12:36 AM  Result Value Ref Range   Phosphorus 2.1 (L) 2.5 - 4.6 mg/dL    Comment: Performed at [facility], [address] [place], [place] [address]  Magnesium     Status: None   Collection Time: 06/15/20 12:36 AM  Result Value Ref Range   Magnesium 2.0 1.7 - 2.4 mg/dL    Comment: Performed at [facility], [address] [place], [place] [address]  Triglycerides     Status: None   Collection Time: 06/15/20 12:36 AM  Result Value Ref Range   Triglycerides 80 <150 mg/dL    Comment: Performed at [facility], [address], [place], [place] [address]  Glucose, capillary     Status: Abnormal   Collection Time:  06/15/20  3:51 AM  Result Value Ref Range   Glucose-Capillary 158 (H) 70 - 99 mg/dL    Comment: Glucose reference range applies only to samples taken after fasting for at least 8 hours.    Recent Results (from the past 240 hour(s))  Respiratory Panel by RT PCR (Flu A&B, Covid) - Nasopharyngeal Swab     Status: None   Collection Time: 07/01/2020  2:40 PM   Specimen: Nasopharyngeal Swab  Result Value Ref Range Status   SARS Coronavirus 2 by RT PCR NEGATIVE NEGATIVE Final    Comment: (NOTE) SARS-CoV-2 target nucleic acids are NOT DETECTED.  The SARS-CoV-2 RNA is generally detectable in upper respiratoy specimens during the acute phase of infection. The lowest concentration of SARS-CoV-2 viral copies this assay can detect is 131 copies/mL. A negative result does not preclude SARS-Cov-2 infection and should not be used as the sole basis for treatment or other patient management decisions. A negative result may occur with  improper specimen collection/handling, submission of specimen other than nasopharyngeal swab, presence of viral mutation(s) within the areas targeted by this assay, and inadequate number of viral copies (<131 copies/mL). A negative result must be combined with clinical observations, patient history, and epidemiological information. The expected result is Negative.  Fact Sheet for Patients:  https://www.moore.com/  Fact  Sheet for Healthcare Providers:  GravelBags.it  This test is no t yet approved or cleared by the [place] FDA and  has been authorized for detection and/or diagnosis of SARS-CoV-2 by FDA under an Emergency Use Authorization (EUA). This EUA will remain  in effect (meaning this test can be used) for the duration of the COVID-19 declaration under Section 564(b)(1) of the Act, 21 U.S.C. section 360bbb-3(b)(1), unless the authorization is terminated or revoked sooner.     Influenza A by PCR NEGATIVE  NEGATIVE Final   Influenza B by PCR NEGATIVE NEGATIVE Final    Comment: (NOTE) The Xpert Xpress SARS-CoV-2/FLU/RSV assay is intended as an aid in  the diagnosis of influenza from Nasopharyngeal swab specimens and  should not be used as a sole basis for treatment. Nasal washings and  aspirates are unacceptable for Xpert Xpress SARS-CoV-2/FLU/RSV  testing.  Fact Sheet for Patients: PinkCheek.be  Fact Sheet for Healthcare Providers: GravelBags.it  This test is not yet approved or cleared by the [place] FDA and  has been authorized for detection and/or diagnosis of SARS-CoV-2 by  FDA under an Emergency Use Authorization (EUA). This EUA will remain  in effect (meaning this test can be used) for the duration of the  Covid-19 declaration under Section 564(b)(1) of the Act, 21  U.S.C. section 360bbb-3(b)(1), unless the authorization is  terminated or revoked. Performed at [facility], [place] [address], [place], [place] [address]   MRSA PCR Screening     Status: None   Collection Time: 06/12/2020  6:10 PM   Specimen: Nasopharyngeal  Result Value Ref Range Status   MRSA by PCR NEGATIVE NEGATIVE Final    Comment:        The GeneXpert MRSA Assay (FDA approved for NASAL specimens only), is one component of a comprehensive MRSA colonization surveillance program. It is not intended to diagnose MRSA infection nor to guide or monitor treatment for MRSA infections. Performed at [facility], [place] [address] [place], [place] [address]   Culture, blood (routine x 2)     Status: None (Preliminary result)   Collection Time: 06/21/2020  6:42 PM   Specimen: BLOOD LEFT ARM  Result Value Ref Range Status   Specimen Description BLOOD LEFT ARM  Final   Special Requests AEROBIC BOTTLE ONLY Blood Culture adequate volume  Final   Culture   Final    NO GROWTH 2 DAYS Performed at [facility], [place] [address]  [place], [place] [address]    Report Status PENDING  Incomplete  Culture, blood (routine x 2)     Status: None (Preliminary result)   Collection Time: 06/26/2020  6:43 PM   Specimen: BLOOD LEFT HAND  Result Value Ref Range Status   Specimen Description BLOOD LEFT HAND  Final   Special Requests   Final    BOTTLES DRAWN AEROBIC AND ANAEROBIC Blood Culture adequate volume   Culture   Final    NO GROWTH 2 DAYS Performed at [facility], [place] [address] [place], [place] [address]    Report Status PENDING  Incomplete    Lipid Panel Recent Labs    06/15/20 0036  TRIG 80    Studies/Results: EEG  Result Date: 06/15/2020 Lora Havens, MD     07/04/2020  6:32 PM Patient Name: Oscar Burke MRN: [id number] Epilepsy Attending: Lora Havens Referring Physician/Provider: Etta Quill, PA Date: 06/12/2020 Duration: 25.04 mins Patient history: 39yo M with cardiac arrest. EEG  to evaluate for seizure Level of alertness:  comatose AEDs during EEG study: None Technical aspects: This EEG study was done with scalp electrodes positioned according to the 10-20 International system of electrode placement. Electrical activity was acquired at a sampling rate of $Remov'500Hz'xrYtSO$  and reviewed with a high frequency filter of $RemoveB'70Hz'OnytaFNN$  and a low frequency filter of $RemoveB'1Hz'ZkVUMnEC$ . EEG data were recorded continuously and digitally stored. Description: EEG showed frequent episodes of spontaneous eye opening with axial jerks. Concomitant eeg showed generalized polsypikes consistent with myoclonic seizure. EEG also showed generalized background suppression. Hyperventilation and photic stimulation were not performed.   ABNORMALITY - Myoclonic seizure, generalized - Background suppression, generalized IMPRESSION: This study showed frequent myoclonic seizures as well as profound diffuse encephalopoathy consistent with anoxic/hypoxic brain injury. Dr Cheral Marker was notifed. Lora Havens   CT Head Wo Contrast  Result Date: 07/06/2020 CLINICAL  DATA:  Recent head and neck trauma, found unresponsive EXAM: CT HEAD WITHOUT CONTRAST CT CERVICAL SPINE WITHOUT CONTRAST TECHNIQUE: Multidetector CT imaging of the head and cervical spine was performed following the standard protocol without intravenous contrast. Multiplanar CT image reconstructions of the cervical spine were also generated. COMPARISON:  11/19/2017 FINDINGS: CT HEAD FINDINGS Brain: Patient is somewhat tilted in the gantry causing some artifact. Vague area of decreased attenuation is noted in the left cerebellar hemisphere. Given the patient's clinical history and acute evolving infarct could not be totally excluded. This is however more likely related to the patient tilting in the gantry. No other findings to suggest acute hemorrhage, acute infarction or space-occupying mass lesion are noted. Vascular: No hyperdense vessel or unexpected calcification. Skull: Normal. Negative for fracture or focal lesion. Sinuses/Orbits: Mucosal thickening is noted within the maxillary antra bilaterally. Prior surgical changes are noted in the maxillary antra. Other: Note is made of gastric catheter coiled within the posterior oropharynx and nasopharynx. CT CERVICAL SPINE FINDINGS Alignment: Within normal limits. Skull base and vertebrae: 7 cervical segments are well visualized. Vertebral body height is well maintained. No acute fracture or acute facet abnormality is noted. Soft tissues and spinal canal: Surrounding soft tissue structures are within normal limits. Upper chest: Visualized lung apices show some mild apical scarring bilaterally. This is new from the prior CT examination. Other: Endotracheal tube is noted in satisfactory position. Gastric catheter is noted coiled within the oro and nasopharynx. IMPRESSION: CT of the head: Somewhat limited exam due the patient tilting in the gantry. Vague area of decreased attenuation within the left cerebellar hemisphere. This is likely related to patient tilting  although an involving infarct cannot be totally excluded. The need for further workup can be determined on a clinical basis. CT of the cervical spine: No acute bony abnormality noted. Gastric catheter coiled within the oropharynx and nasopharynx as described. Electronically Signed   By: Inez Catalina M.D.   On: 06/11/2020 15:19   CT Cervical Spine Wo Contrast  Result Date: 07/03/2020 CLINICAL DATA:  Recent head and neck trauma, found unresponsive EXAM: CT HEAD WITHOUT CONTRAST CT CERVICAL SPINE WITHOUT CONTRAST TECHNIQUE: Multidetector CT imaging of the head and cervical spine was performed following the standard protocol without intravenous contrast. Multiplanar CT image reconstructions of the cervical spine were also generated. COMPARISON:  11/19/2017 FINDINGS: CT HEAD FINDINGS Brain: Patient is somewhat tilted in the gantry causing some artifact. Vague area of decreased attenuation is noted in the left cerebellar hemisphere. Given the patient's clinical history and acute evolving infarct could not be totally excluded. This is however more likely related  to the patient tilting in the gantry. No other findings to suggest acute hemorrhage, acute infarction or space-occupying mass lesion are noted. Vascular: No hyperdense vessel or unexpected calcification. Skull: Normal. Negative for fracture or focal lesion. Sinuses/Orbits: Mucosal thickening is noted within the maxillary antra bilaterally. Prior surgical changes are noted in the maxillary antra. Other: Note is made of gastric catheter coiled within the posterior oropharynx and nasopharynx. CT CERVICAL SPINE FINDINGS Alignment: Within normal limits. Skull base and vertebrae: 7 cervical segments are well visualized. Vertebral body height is well maintained. No acute fracture or acute facet abnormality is noted. Soft tissues and spinal canal: Surrounding soft tissue structures are within normal limits. Upper chest: Visualized lung apices show some mild apical  scarring bilaterally. This is new from the prior CT examination. Other: Endotracheal tube is noted in satisfactory position. Gastric catheter is noted coiled within the oro and nasopharynx. IMPRESSION: CT of the head: Somewhat limited exam due the patient tilting in the gantry. Vague area of decreased attenuation within the left cerebellar hemisphere. This is likely related to patient tilting although an involving infarct cannot be totally excluded. The need for further workup can be determined on a clinical basis. CT of the cervical spine: No acute bony abnormality noted. Gastric catheter coiled within the oropharynx and nasopharynx as described. Electronically Signed   By: Inez Catalina M.D.   On: 06/21/2020 15:19   DG Chest Portable 1 View  Result Date: 07/02/2020 CLINICAL DATA:  ETT insertion. EXAM: PORTABLE CHEST 1 VIEW COMPARISON:  05/10/2019 FINDINGS: Single AP view of the chest. Endotracheal tube terminates 5.1 cm above carina. Nasogastric tube extends beyond the inferior aspect of the film. External pacer/defibrillator. The low chest is excluded. Midline trachea. Borderline cardiomegaly. No large pleural effusion. No pneumothorax. No congestive failure. No lobar consolidation. IMPRESSION: Limited exam, with the low chest excluded. Appropriate position of endotracheal tube. No acute cardiopulmonary disease. Electronically Signed   By: Abigail Miyamoto M.D.   On: 06/26/2020 14:51   Overnight EEG with video  Result Date: 06/14/2020 Lora Havens, MD     06/15/2020  6:49 AM Patient Name: Oscar Burke MRN: [id number] Epilepsy Attending: Lora Havens Referring Physician/Provider:  Dr. Kerney Elbe Duration:  06/20/2020 1832 to 06/14/2020 1832  Patient history: 39yo M with cardiac arrest. EEG to evaluate for seizure  Level of alertness:  comatose  AEDs during EEG study: None  Technical aspects: This EEG study was done with scalp electrodes positioned according to the 10-20 International system of  electrode placement. Electrical activity was acquired at a sampling rate of 500Hz  and reviewed with a high frequency filter of 70Hz  and a low frequency filter of 1Hz . EEG data were recorded continuously and digitally stored. Description: EEG initially showed frequent episodes of spontaneous eye opening with axial jerks, avg per 30 secs. Concomitant eeg showed high amplitude generalized sharply contoured 3-5hz  thet-delta slowing consistent with myoclonic seizure.  After 1:30 AM on 06/14/2020, the frequency of myoclonic seizures improved but again started worsening after around 4 AM. EEG also showed generalized background suppression between myoclonic jerks. 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  Lora Havens   ECHOCARDIOGRAM COMPLETE  Result Date: 06/14/2020    ECHOCARDIOGRAM REPORT   Patient Name:   Oscar Burke Date of Exam: 06/14/2020 Medical Rec #:  [id number]        Height:  72.0 in Accession #:    [id number]       Weight:       165.3 lb Date of Birth:  Mar 20, 1981        BSA:          1.965 m Patient Age:    40 years         BP:           79/49 mmHg Patient Gender: M                HR:           84 bpm. Exam Location:  Inpatient Procedure: 2D Echo, 3D Echo, Cardiac Doppler and Color Doppler Indications:    R01.1 Murmur  History:        Patient has prior history of Echocardiogram examinations, most                 recent 06/13/2018. Endocarditis and TV vegetation,                 Arrythmias:Cardiac Arrest; Signs/Symptoms:Fever. IVDU. Opiate                 overdose. Poly substance abuse. Hypoxia.  Sonographer:    Roseanna Rainbow RDCS Referring Phys: [id number] [facility]  Sonographer Comments: Technically difficult study due to poor echo windows and echo performed with patient supine and on artificial respirator.  Patient on vent and EEG. Could not turn. IMPRESSIONS  1. Left ventricular ejection fraction, by estimation, is 35 to 40%. The left ventricle has moderately decreased function. The left ventricle demonstrates global hypokinesis. There is mild concentric left ventricular hypertrophy. Left ventricular diastolic parameters were normal.  2. Right ventricular systolic function is normal. The right ventricular size is normal. There is normal pulmonary artery systolic pressure.  3. The mitral valve is normal in structure. Trivial mitral valve regurgitation. No evidence of mitral stenosis.  4. There is a mass on the tricuspid valve that likely represents a vegetation .     3 D images of the tricuspid valve and the vegetation were obtained.     . Tricuspid valve regurgitation is moderate to severe.  5. The aortic valve is normal in structure. Aortic valve regurgitation is not visualized. No aortic stenosis is present. FINDINGS  Left Ventricle: Left ventricular ejection fraction, by estimation, is 35 to 40%. The left ventricle has moderately decreased function. The left ventricle demonstrates global hypokinesis. The left ventricular internal cavity size was normal in size. There is mild concentric left ventricular hypertrophy. Left ventricular diastolic parameters were normal. Right Ventricle: The right ventricular size is normal. No increase in right ventricular wall thickness. Right ventricular systolic function is normal. There is normal pulmonary artery systolic pressure. The tricuspid regurgitant velocity is 2.12 m/s, and  with an assumed right atrial pressure of 8 mmHg, the estimated right ventricular systolic pressure is 97.6 mmHg. Left Atrium: Left atrial size was normal in size. Right Atrium: Right atrial size was normal in size. Pericardium: There is no evidence of pericardial effusion. Mitral Valve: The mitral valve is normal in structure. Trivial mitral valve regurgitation. No evidence of mitral valve stenosis.  Tricuspid Valve: There is a mass on the tricuspid valve that likely represents a vegetation . 3 D images of the tricuspid valve and the vegetation were obtained. The tricuspid valve is grossly normal. Tricuspid valve regurgitation is moderate to severe. Aortic Valve: The aortic valve is normal in structure. Aortic valve regurgitation is not visualized.  No aortic stenosis is present. Pulmonic Valve: The pulmonic valve was normal in structure. Pulmonic valve regurgitation is not visualized. Aorta: The aortic root and ascending aorta are structurally normal, with no evidence of dilitation. IAS/Shunts: The atrial septum is grossly normal.  LEFT VENTRICLE PLAX 2D LVIDd:         5.10 cm      Diastology LVIDs:         4.10 cm      LV e' medial:    10.90 cm/s LV PW:         1.20 cm      LV E/e' medial:  7.8 LV IVS:        1.20 cm      LV e' lateral:   12.50 cm/s LVOT diam:     2.10 cm      LV E/e' lateral: 6.8 LV SV:         59 LV SV Index:   30 LVOT Area:     3.46 cm  LV Volumes (MOD) LV vol d, MOD A2C: 152.0 ml LV vol d, MOD A4C: 107.0 ml LV vol s, MOD A2C: 94.1 ml LV vol s, MOD A4C: 61.5 ml LV SV MOD A2C:     57.9 ml LV SV MOD A4C:     107.0 ml LV SV MOD BP:      53.2 ml RIGHT VENTRICLE             IVC RV S prime:     14.50 cm/s  IVC diam: 2.80 cm TAPSE (M-mode): 1.9 cm LEFT ATRIUM             Index       RIGHT ATRIUM           Index LA diam:        4.30 cm 2.19 cm/m  RA Area:     22.00 cm LA Vol (A2C):   21.6 ml 10.99 ml/m RA Volume:   83.50 ml  42.49 ml/m LA Vol (A4C):   25.0 ml 12.72 ml/m LA Biplane Vol: 24.0 ml 12.21 ml/m  AORTIC VALVE LVOT Vmax:   87.90 cm/s LVOT Vmean:  65.200 cm/s LVOT VTI:    0.171 m  AORTA Ao Root diam: 3.60 cm MITRAL VALVE               TRICUSPID VALVE MV Area (PHT): 6.17 cm    TR Peak grad:   18.0 mmHg MV Decel Time: 123 msec    TR Vmax:        212.00 cm/s MV E velocity: 84.90 cm/s MV A velocity: 48.50 cm/s  SHUNTS MV E/A ratio:  1.75        Systemic VTI:  0.17 m                             Systemic Diam: 2.10 cm Mertie Moores MD Electronically signed by Mertie Moores MD Signature Date/Time: 06/14/2020/11:18:55 AM    Final     Medications:  Scheduled: . chlorhexidine gluconate (MEDLINE KIT)  15 mL Mouth Rinse BID  . Chlorhexidine Gluconate Cloth  6 each Topical Daily  . clonazePAM  2 mg Per Tube TID  . feeding supplement (PROSource TF)  45 mL Per Tube BID  . heparin  5,000 Units Subcutaneous Q8H  . insulin aspart  0-15 Units Subcutaneous Q4H  . mouth rinse  15 mL Mouth Rinse 10 times per day  .  pantoprazole (PROTONIX) IV  40 mg Intravenous Q12H  . polyethylene glycol  17 g Per Tube Daily  . valproic acid  500 mg Per Tube Q8H   Continuous: . sodium chloride Stopped (06/14/20 1946)  . ampicillin-sulbactam (UNASYN) IV Stopped (06/15/20 0553)  . dexmedetomidine (PRECEDEX) IV infusion 1 mcg/kg/hr (06/15/20 0600)  . feeding supplement (VITAL 1.5 CAL) 1,000 mL (06/14/20 2345)  . fentaNYL infusion INTRAVENOUS 100 mcg/hr (06/15/20 0600)  . levETIRAcetam 1,000 mg (06/15/20 0626)  . propofol (DIPRIVAN) infusion Stopped (06/14/20 0617)  . sodium chloride    . vancomycin Stopped (06/15/20 0303)    LTM EEG report for this AM: ABNORMALITY -Myoclonic seizure, generalized -Burst suppression, generalized IMPRESSION: This studyshowed myoclonic seizures as well as profound diffuse encephalopoathy consistent with anoxic/hypoxic brain injury. EEG appears to be worsening compared to previous day   Assessment: 39 year old male with polysubstance abuse who presented after cardiac arrest and was noted to have myoclonic seizures. Myoclonic seizures Coma Suspected severe anoxic/hypoxic brain injury Cardiac arrest Transaminitis Hypomagnesemia Hypophosphatemia Leukocytosis Hypoproteinemia with hypoalbuminemia Hyperglycemia Lactic acidosis Elevated CK -On exam this AM, myoclonus is resolved. Flaccid tone in all 4 extremities. Absent brainstem reflexes.    Recommendations Anoxic brain injury with myoclonus: -Continue valproic acid 500 mg per tube every 8 hours -Continue Keppra at 1000 mg IV BID -Continue Klonopin 2 mg per tube TID -Myoclonic seizures in first 24 hours after cardiac arrest is suggestive of severe neurologic injury and poor prognosis.   -Continue seizure precautions -CTH now given change in pupils and loss of brain stem reflexes -Plan to continue management of seizures and obtain MRI brain without contrast to look for anoxic/hypoxic brain injury at 24 hours (Sunday)  Goals of care: -Dr. Hortense Ramal spoke with patient's girlfriend over the telephone (10/8). She stated that she has been with the patient for 8 years and is his common-law wife.  She did state that patient has a brother in [place] that she is trying to contact but were not very close together.  Dr. Hortense Ramal discussed patient's neurologic status with girlfriend.  She stated patient would not want to live like this.  -Dr. Hortense Ramal was able to obtain patient's brother's number from patient's girlfriend.  His name is Venia Carbon [phone].  Dr. Hortense Ramal called and spoke with the brother who stated that he would want everything done including trach/PEG even if his brother ends up in vegetative state.  Addendum: CT head at 2951 shows diffuse cerebral edema,.consistent with widespread, severe diffuse anoxic brain injury. There is complete to near-complete effacement of the CSF spaces infratentorially, consistent with impending herniation. There is no indication for hypertonic saline or neurosurgical intervention, as it would not change outcome.   40 minutes spent in the neurological evaluation and management of this critically ill patient. Time spent included coordination of care.    LOS: 2 days  42min cc time spent [facility], ARNP-C, ANVP-BC Pager: [phone]

## 2020-06-15 NOTE — Progress Notes (Signed)
LTM EEG discontinued - no skin breakdown at unhook.   

## 2020-06-15 NOTE — Progress Notes (Signed)
Notified by radiology that patient is actively herniating brain. Progressively dropping HR today, currently 59.  Updated brother Onalee Hua via phone. He understands the grave prognosis and agrees that resuscitation after arrest would be futile. Code status changes to DNR. RN updated and order changed in chart.  Steffanie Dunn, DO 06/15/20 2:26 PM [facility]

## 2020-06-15 NOTE — Plan of Care (Signed)
D/w Dr. Chestine Spore PCCM DO -discontinuing sedation-- anticipate possible need for brain death evaluation    Tessie Fass MSN, AGACNP-BC [facility] [phone] If no answer, [phone] 06/15/2020, 6:20 PM

## 2020-06-15 NOTE — Progress Notes (Addendum)
NAME:  Oscar Burke, MRN:  [id number], DOB:  12-29-1980, LOS: 2 ADMISSION DATE:  2020/06/15, CONSULTATION DATE:  06-15-20 REFERRING MD:  Dr. Criss Alvine, CHIEF COMPLAINT:  Cardiac arrest   Brief History   39 year old male found down and unresponsive, on EMS arrival patient was seen in pulseless, apneic, and asystolic requiring approximately 10 minutes of CPR with 2 rounds of epinephrine prior to return of spontaneous circulation.  Unknown downtime prior to initiation of CPR.  PCCM consulted for further management and admission.  History of present illness   Oscar Burke is a 39 year old male with a past medical history significant for bipolar disease, chronic pain, hepatitis C, polysubstance abuse, and current smoker who was found down and unresponsive.  On EMS arrival patient was seen pulseless, apneic, and asystolic requiring 10 minutes of CPR and 2 rounds of epinephrine before achieving ROSC unknown downtime prior to initiation of CPR.  Patient was intubated on ED arrival.  Vital signs significant on arrival for tachycardia and hypertension.  Majority of lab work pending at time of assessment however abnormalities included lactate of 8.2 glucose of 337, and WBC 12.2.  Initial chest x-ray negative for any acute abnormalities. CT head with questionable decreased attenuation within the left cerebral hemisphere likely related to patient positioning but evolving infarct cannot be excluded.  Past Medical History  Bipolar disease Chronic pain Hepatitis C Polysubstance abuse Current smoker  Significant Hospital Events     Consults:  PCCM Neuro  Procedures:  ETT 10/7 >  Significant Diagnostic Tests:  Chest x-ray 10/7 >negative for any acute abnormalities.   CT head 10/7 > questionable decreased attenuation within the left cerebral hemisphere likely related to patient positioning but evolving infarct cannot be excluded.  TTE: LVEF 35-40%, tricuspid vegetation  10/9> EEG with  generalized myoclonic seizures, profound diffuse encephalopathy consistent with anoxic brain injury. Worse from prior EEG   Micro Data:  COVID-19 10/7> neg  BCx 10/7> no growth at 2 days   Antimicrobials:  Unasyn 10/7- Vanc 10/7-  Interim history/subjective:   This morning, EEG with generalized myoclonic seizures, profound diffuse encephalopathy consistent with anoxic brain injury. Worse from prior EEG   Limited lab results 10/9 AM -- phos 2.1 and mag 2.0  Febrile -- 101.48   Has been off of propofol and on fentanyl, precedex  Objective   Blood pressure 125/78, pulse 77, temperature (!) 100.9 F (38.3 C), temperature source Oral, resp. rate 20, height 6' (1.829 m), weight 87 kg, SpO2 100 %.    Vent Mode: PCV FiO2 (%):  [30 %-40 %] 30 % Set Rate:  [20 bmp] 20 bmp PEEP:  [5 cmH20] 5 cmH20 Plateau Pressure:  [11 cmH20] 11 cmH20   Intake/Output Summary (Last 24 hours) at 06/15/2020 0823 Last data filed at 06/15/2020 0600 Gross per 24 hour  Intake 2432.1 ml  Output 1750 ml  Net 682.1 ml   Filed Weights   06-15-2020 1400 06/15/20 0500  Weight: 75 kg 87 kg    Examination: General: critically ill appearing middle aged M, intubated, sedated on cEEG NAD  HENT: NCAT ETT secure OGt secure. Trachea midline  Lungs: CTAb, symmetrical chest expansion, mechanically ventilated Cardiovascular: RRR s1s2 cap refill brisk 2+ radial pulses  Abdomen:Soft ndnt + bowel sounds Extremities: Symmetrical muscle bulk. No obvious joint deformity. No cyanosis or clubbing. Non-pitting BUE edema  Neuro: R pupil 37mm L pupil 55mm, not responsive. No corneal reflex. Does not respond to painful stimuli. Flaccid tone. Does initiate spontaneous respiration on  PSV/CPAP  Derm: Scattered scabs, track marks    Resolved Hospital Problem list     Assessment & Plan:   PEA arrest - found down unconscious for unknown period of time. Due to hypoxia d/t opiate overdose.  CPR, 2 epi. Acute HFrEF Tricuspid  regurgitation with vegetation-suspect this is old from previous endocarditis Troponin elevation likely 2/2 chest compressions, ischemia -Neuroprotective measures-maintain normal electrolytes, normoglycemia, normocapnia.  Acute hypoxic vent dependent respiratory failure post cardiac arrest -Cont MV support -- on PCV 10/9 -VAP prevention protocol --clinically not appropriate for WUA/SBT  -Cont unasyn as ppx abx for possible aspiration   Myoclonic seizures Suspected anoxic brain injury -EEG worse 10/9 from prior -asymmetric pupils 10/9 -- R 74mm, L 24mm  -Appreciate neurology's assistance.  -Keppra, VPA, Clonazepam. Continue propofol -- titrate up to 18mcg/hr if sz persist  -Continuous EEG --Plan to cont mgmnt of sz, obtain MRI brain non con Sunday vs Monday  -- Will get STAT CT H non con given pupil change -- concern for herniation   Hx TV endocarditis Polysubstance abuse -Continue empiric vancomycin and beta-lactam -Blood cultures pending  AKI Rhabdomyolysis -CMP 10/9, BMP 10/0 -check CK -I/O   Hyperglycemia No hx of diabetes. Glucose on arrival elevated, most likely stress-induced. -SSI -Goal BG 140-180   Elevated transaminase levels History of chronic Hepatitis C Previous liver disease with hep C (dx 2015). Given patient down unknown time, likely shock liver. -CMP 10/9   GoC -Per prior PCCM note: "Brother Onalee Hua updated over the phone.  He indicated that for right now his brother would not want everything done, but would not want long-term SNF, trach, PEG.  He lost his other brother and sister in 2019 and 2020 due to drug overdoses.  There are no other living family members. He is currently working [employer] and gets spotty cell reception but has requested we leave messages if he is unavailable."  Best practice:  Diet: NPO Pain/Anxiety/Delirium protocol (if indicated): PAD VAP protocol (if indicated): per protocol DVT prophylaxis: heparin GI prophylaxis:  Protonix Glucose control: SSI Mobility: bed Code Status: Full Family Communication: pending communication with pt brother, Onalee Hua Disposition: ICU  Labs   CBC: Recent Labs  Lab July 11, 2020 1435 07/11/20 1435 2020-07-11 1452 2020-07-11 1551 07/11/20 1720 06/14/20 0052 06/14/20 0353  WBC 12.2*  --   --   --   --  28.1*  --   NEUTROABS 6.7  --   --   --   --   --   --   HGB 14.1   < > 15.3 15.3 14.6 15.2 13.9  HCT 47.7   < > 45.0 45.0 43.0 47.3 41.0  MCV 95.6  --   --   --   --  90.4  --   PLT 262  --   --   --   --  320  --    < > = values in this interval not displayed.    Basic Metabolic Panel: Recent Labs  Lab 07/11/2020 1435 2020-07-11 1435 2020/07/11 1452 2020/07/11 1452 July 11, 2020 1551 07-11-2020 1720 06/14/20 0052 06/14/20 0353 06/14/20 0953 06/15/20 0036  NA 135   < > 135   < > 136 137 138 136 137  --   K 4.0   < > 4.0   < > 3.6 3.8 4.3 5.7* 4.8  --   CL 98  --  98  --   --   --  103  --  104  --  CO2 20*  --   --   --   --   --  25  --  24  --   GLUCOSE 349*  --  337*  --   --   --  151*  --  130*  --   BUN 14  --  16  --   --   --  12  --  14  --   CREATININE 1.34*  --  1.00  --   --   --  0.94  --  0.93  --   CALCIUM 8.7*  --   --   --   --   --  8.6*  --  8.1*  --   MG  --   --   --   --   --   --  1.5*  --   --  2.0  PHOS  --   --   --   --   --   --  1.5*  --   --  2.1*   < > = values in this interval not displayed.   GFR: Estimated Creatinine Clearance: 114.7 mL/min (by C-G formula based on SCr of 0.93 mg/dL). Recent Labs  Lab July 09, 2020 1435 July 09, 2020 1440 2020-07-09 2208 06/14/20 0052 06/14/20 1557 06/14/20 1917  WBC 12.2*  --   --  28.1*  --   --   LATICACIDVEN  --    < > 2.4* 2.4* 1.2 1.1   < > = values in this interval not displayed.    Liver Function Tests: Recent Labs  Lab 07/09/2020 1435 06/14/20 0052  AST 146* 83*  ALT 51* 49*  ALKPHOS 133* 115  BILITOT 0.5 0.5  PROT 5.9* 6.1*  ALBUMIN 3.2* 3.2*   No results for input(s): LIPASE, AMYLASE in  the last 168 hours. No results for input(s): AMMONIA in the last 168 hours.  ABG    Component Value Date/Time   PHART 7.351 06/14/2020 0353   PCO2ART 53.6 (H) 06/14/2020 0353   PO2ART 119 (H) 06/14/2020 0353   HCO3 29.6 (H) 06/14/2020 0353   TCO2 31 06/14/2020 0353   ACIDBASEDEF 1.0 07/09/2020 1720   O2SAT 98.0 06/14/2020 0353     Coagulation Profile: Recent Labs  Lab 09-Jul-2020 1435  INR 0.9    Cardiac Enzymes: Recent Labs  Lab 06/14/20 0953 06/14/20 1557  CKTOTAL 641* 570*    HbA1C: Hgb A1c MFr Bld  Date/Time Value Ref Range Status  2020-07-09 10:08 PM 5.6 4.8 - 5.6 % Final    Comment:    (NOTE) Pre diabetes:          5.7%-6.4%  Diabetes:              >6.4%  Glycemic control for   <7.0% adults with diabetes     CBG: Recent Labs  Lab 06/14/20 1514 06/14/20 1933 06/14/20 2334 06/15/20 0351 06/15/20 0739  GLUCAP 137* 124* 147* 158* 132*     CRITICAL CARE Performed by: Lanier Clam   Total critical care time: 38 minutes  Critical care time was exclusive of separately billable procedures and treating other patients. Critical care was necessary to treat or prevent imminent or life-threatening deterioration.  Critical care was time spent personally by me on the following activities: development of treatment plan with patient and/or surrogate as well as nursing, discussions with consultants, evaluation of patient's response to treatment, examination of patient, obtaining history from patient or surrogate, ordering and performing treatments and interventions,  ordering and review of laboratory studies, ordering and review of radiographic studies, pulse oximetry and re-evaluation of patient's condition.  Tessie FassGrace Moustapha Tooker MSN, AGACNP-BC  Pulmonary/Critical Care Medicine [id number] If no answer, [phone] 06/15/2020, 8:23 AM

## 2020-06-16 DIAGNOSIS — G931 Anoxic brain damage, not elsewhere classified: Secondary | ICD-10-CM

## 2020-06-16 LAB — CBC
HCT: 46 % (ref 39.0–52.0)
Hemoglobin: 14.1 g/dL (ref 13.0–17.0)
MCH: 29 pg (ref 26.0–34.0)
MCHC: 30.7 g/dL (ref 30.0–36.0)
MCV: 94.7 fL (ref 80.0–100.0)
Platelets: 217 10*3/uL (ref 150–400)
RBC: 4.86 MIL/uL (ref 4.22–5.81)
RDW: 12.9 % (ref 11.5–15.5)
WBC: 15.8 10*3/uL — ABNORMAL HIGH (ref 4.0–10.5)
nRBC: 0 % (ref 0.0–0.2)

## 2020-06-16 LAB — TRIGLYCERIDES: Triglycerides: 79 mg/dL (ref <150)

## 2020-06-16 LAB — BASIC METABOLIC PANEL
Anion gap: 7 (ref 5–15)
BUN: 10 mg/dL (ref 6–20)
CO2: 35 mmol/L — ABNORMAL HIGH (ref 22–32)
Calcium: 9 mg/dL (ref 8.9–10.3)
Chloride: 104 mmol/L (ref 98–111)
Creatinine, Ser: 0.66 mg/dL (ref 0.61–1.24)
GFR, Estimated: >60 mL/min (ref >60)
Glucose, Bld: 123 mg/dL — ABNORMAL HIGH (ref 70–99)
Potassium: 4.8 mmol/L (ref 3.5–5.1)
Sodium: 146 mmol/L — ABNORMAL HIGH (ref 135–145)

## 2020-06-16 LAB — MAGNESIUM: Magnesium: 1.9 mg/dL (ref 1.7–2.4)

## 2020-06-16 LAB — GLUCOSE, CAPILLARY
Glucose-Capillary: 112 mg/dL — ABNORMAL HIGH (ref 70–99)
Glucose-Capillary: 124 mg/dL — ABNORMAL HIGH (ref 70–99)
Glucose-Capillary: 127 mg/dL — ABNORMAL HIGH (ref 70–99)
Glucose-Capillary: 138 mg/dL — ABNORMAL HIGH (ref 70–99)
Glucose-Capillary: 139 mg/dL — ABNORMAL HIGH (ref 70–99)
Glucose-Capillary: 143 mg/dL — ABNORMAL HIGH (ref 70–99)

## 2020-06-16 LAB — PHOSPHORUS: Phosphorus: 3 mg/dL (ref 2.5–4.6)

## 2020-06-16 MED ORDER — SODIUM CHLORIDE 0.9 % IV SOLN
250.0000 mL | INTRAVENOUS | Status: DC
  Administered 2020-06-16: 250 mL via INTRAVENOUS

## 2020-06-16 MED ORDER — NOREPINEPHRINE 4 MG/250ML-% IV SOLN: 0.0000 ug/min | INTRAVENOUS | Status: DC

## 2020-06-16 MED ORDER — DESMOPRESSIN ACETATE 4 MCG/ML IJ SOLN
1.0000 ug | Freq: Four times a day (QID) | INTRAMUSCULAR | Status: DC
  Administered 2020-06-16 – 2020-06-17 (×2): 1 ug via INTRAVENOUS
  Filled 2020-06-16: qty 1

## 2020-06-16 MED ORDER — LACTATED RINGERS IV BOLUS
1000.0000 mL | Freq: Once | INTRAVENOUS | Status: AC
  Administered 2020-06-16: 1000 mL via INTRAVENOUS

## 2020-06-16 MED ORDER — DESMOPRESSIN ACETATE 4 MCG/ML IJ SOLN
1.0000 ug | Freq: Once | INTRAMUSCULAR | Status: AC
  Administered 2020-06-16: 1 ug via INTRAVENOUS
  Filled 2020-06-16: qty 1

## 2020-06-16 MED ORDER — NOREPINEPHRINE 4 MG/250ML-% IV SOLN
2.0000 ug/min | INTRAVENOUS | Status: DC
  Administered 2020-06-16: 2 ug/min via INTRAVENOUS
  Administered 2020-06-17: 20 ug/min via INTRAVENOUS
  Administered 2020-06-17: 7 ug/min via INTRAVENOUS
  Filled 2020-06-16 (×4): qty 250

## 2020-06-16 NOTE — Plan of Care (Signed)
Updated brother Onalee Hua over the phone. He indicated that Oscar Burke's sponsor Gasper Lloyd is driving from [place] and wants to visit if she arrives before he passes. His brother is ok with her visiting. RN notified to allow her as a visitor for this patient.  Steffanie Dunn, DO 06/16/20 7:23 PM [facility]

## 2020-06-16 NOTE — Progress Notes (Signed)
Pharmacy Antibiotic Note  Oscar Burke is a 39 y.o. male admitted on 21-Jun-2020 with suspected bacteremia.  Pharmacy has been consulted for vancomcyin dosing. Pt has past history for bacteremia and history of polysubstance abuse (IVDU meth, cocaine, heroin). Pt has elevated WBC of 15.8. Pt is also s/p cardiac arrest +seizure.   Remains on Unasyn for possible aspiration PNA, and vancomycin for suspicion of bacteremia given TV vegetation.  Plan: Continue vancomycin 1g q 8 hrs for now. If antibiotics continue, will get a level tomorrow.  Monitor cbc, renal function, micro data, and clinical progress Monitor vanc trough at steady state as indicated  Height: 6' (182.9 cm) Weight: 83.3 kg (183 lb 10.3 oz) IBW/kg (Calculated) : 77.6  Temp (24hrs), Avg:99.1 F (37.3 C), Min:97 F (36.1 C), Max:102 F (38.9 C)  Recent Labs  Lab 06-21-2020 1435 06/21/2020 1440 June 21, 2020 1452 2020/06/21 1842 06/21/2020 2208 06/14/20 0052 06/14/20 0953 06/14/20 1557 06/14/20 1917 06/15/20 1140 06/16/20 0308  WBC 12.2*  --   --   --   --  28.1*  --   --   --  15.3* 15.8*  CREATININE 1.34*   < > 1.00  --   --  0.94 0.93  --   --  0.80 0.66  LATICACIDVEN  --    < >  --  2.4* 2.4* 2.4*  --  1.2 1.1  --   --    < > = values in this interval not displayed.    Estimated Creatinine Clearance: 133.4 mL/min (by C-G formula based on SCr of 0.66 mg/dL).    No Known Allergies  Antimicrobials this admission: vanc 10/7> unasyn 10/7>   Microbiology results: 10/7 COVID/Flu - neg 10/7 MRSA PCR - neg  10/7 Blood cx x2 - ngtd    Thank you for allowing pharmacy to be a part of this patient's care.  Reece Leader, Colon Flattery, BCCP Clinical Pharmacist  06/16/2020 12:13 PM   [facility] pharmacy phone numbers are listed on [email or web address]

## 2020-06-16 NOTE — Progress Notes (Signed)
NAME:  Oscar Burke, MRN:  [id number], DOB:  1981-04-10, LOS: 3 ADMISSION DATE:  06/24/2020, CONSULTATION DATE:  06/22/2020 REFERRING MD:  Dr. Criss Alvine, CHIEF COMPLAINT:  Cardiac arrest   Brief History   39 year old male found down and unresponsive, on EMS arrival patient was seen in pulseless, apneic, and asystolic requiring approximately 10 minutes of CPR with 2 rounds of epinephrine prior to return of spontaneous circulation.  Unknown downtime prior to initiation of CPR.  PCCM consulted for further management and admission.  History of present illness   Oscar Burke is a 39 year old male with a past medical history significant for bipolar disease, chronic pain, hepatitis C, polysubstance abuse, and current smoker who was found down and unresponsive.  On EMS arrival patient was seen pulseless, apneic, and asystolic requiring 10 minutes of CPR and 2 rounds of epinephrine before achieving ROSC unknown downtime prior to initiation of CPR.  Patient was intubated on ED arrival.  Vital signs significant on arrival for tachycardia and hypertension.  Majority of lab work pending at time of assessment however abnormalities included lactate of 8.2 glucose of 337, and WBC 12.2.  Initial chest x-ray negative for any acute abnormalities. CT head with questionable decreased attenuation within the left cerebral hemisphere likely related to patient positioning but evolving infarct cannot be excluded.  Past Medical History  Bipolar disease Chronic pain Hepatitis C Polysubstance abuse Current smoker  Significant Hospital Events     Consults:  PCCM Neuro  Procedures:  ETT 10/7 >  Significant Diagnostic Tests:  Chest x-ray 10/7 >negative for any acute abnormalities.   CT head 10/7 > questionable decreased attenuation within the left cerebral hemisphere likely related to patient positioning but evolving infarct cannot be excluded.  TTE: LVEF 35-40%, tricuspid vegetation  10/9> EEG with  generalized myoclonic seizures, profound diffuse encephalopathy consistent with anoxic brain injury. Worse from prior EEG   Micro Data:  COVID-19 10/7> neg  BCx 10/7> no growth at 2 days   Antimicrobials:  Unasyn 10/7- Vanc 10/7-  Interim history/subjective:  Was placed on precedex overnight to ensure comfort, which was turned off at the start of day shift today. Febrile overnight.  Objective   Blood pressure 116/74, pulse 95, temperature 98.1 F (36.7 C), resp. rate 20, height 6' (1.829 m), weight 83.3 kg, SpO2 96 %.    Vent Mode: PCV FiO2 (%):  [30 %-40 %] 40 % Set Rate:  [20 bmp] 20 bmp PEEP:  [5 cmH20] 5 cmH20 Plateau Pressure:  [11 cmH20-12 cmH20] 12 cmH20   Intake/Output Summary (Last 24 hours) at 06/16/2020 1614 Last data filed at 06/16/2020 1500 Gross per 24 hour  Intake 1536.65 ml  Output 4835 ml  Net -3298.35 ml   Filed Weights   07/07/2020 1400 06/15/20 0500 06/16/20 0500  Weight: 75 kg 87 kg 83.3 kg    Examination: General: examined at 4pm off all sedation x 9 hours HENT: [place]/AT, eyes anicteric Lungs: CTAB, breathing synchronously with the vent Cardiovascular: regular rate & rhythm, no murmurs Abdomen: soft, NT, ND Extremities: minimal hand edema, no cyanosis or clubbing Neuro: Fixed dilated pupils, no corneal or doll's eyes reflexes, no response to nailbed pressure in all extremities. No response to verbal stimulation. No pharyngeal gag or tracheal cough. When vent turned to rate of 8 for several minutes, he does not breathe over. Derm: warm, dry GU: foley; large volume diluate urine  Resolved Hospital Problem list     Assessment & Plan:   PEA arrest - found  down unconscious for unknown period of time. Due to hypoxia d/t opiate overdose.  CPR, 2 epi. Acute HFrEF Tricuspid regurgitation with vegetation- suspect this is old from previous endocarditis Troponin elevation likely 2/2 chest compressions, ischemia -Neuroprotective measures-maintain  normal electrolytes, normoglycemia, normocapnia.   Acute hypoxic vent dependent respiratory failure post cardiac arrest -Cont MV support -- on PCV 10/9 -VAP prevention protocol --clinically not appropriate for WUA/SBT  -Cont unasyn as ppx abx for possible aspiration   Myoclonic seizures Suspected anoxic brain injury -EEG worse 10/9 from prior -concerned that he may be brain dead -Appreciate neurology's assistance. -Sedation discontinued this morning for ongoing neuroprognostication. Will plan to do formal brain death testing tomorrow morning given use of klonopin this morning  -Keppra, VPA ok to con't. Clonazepam d/c. Previously d/c propofol.  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 -SSI PRN -Goal BG 140-180   Elevated transaminase levels- improving History of chronic Hepatitis C Previous liver disease with hep C (dx 2015). Given patient down unknown time, likely shock liver. -con't to periodically monitor  GoC -updated brother over the phone; he would like to be notified once the patient has passed  Best practice:  Diet: NPO Pain/Anxiety/Delirium protocol (if indicated): PAD VAP protocol (if indicated): per protocol DVT prophylaxis: heparin GI prophylaxis: Protonix Glucose control: SSI Mobility: bed Code Status: Full Family Communication: brother, Onalee Hua Disposition: ICU  Labs   CBC: Recent Labs  Lab 06/29/2020 1435 06/08/2020 1452 06/10/2020 1720 06/14/20 0052 06/14/20 0353 06/15/20 1140 06/16/20 0308  WBC 12.2*  --   --  28.1*  --  15.3* 15.8*  NEUTROABS 6.7  --   --   --   --   --   --   HGB 14.1   < > 14.6 15.2 13.9 12.1* 14.1  HCT 47.7   < > 43.0 47.3 41.0 38.6* 46.0  MCV 95.6  --   --  90.4  --  90.8 94.7   PLT 262  --   --  320  --  194 217   < > = values in this interval not displayed.    Basic Metabolic Panel: Recent Labs  Lab 06/14/2020 1435 06/18/2020 1435 06/12/2020 1452 07/03/2020 1551 06/14/20 0052 06/14/20 0353 06/14/20 0953 06/15/20 0036 06/15/20 1140 06/16/20 0308  NA 135   < > 135   < > 138 136 137  --  140 146*  K 4.0   < > 4.0   < > 4.3 5.7* 4.8  --  4.4 4.8  CL 98   < > 98  --  103  --  104  --  102 104  CO2 20*  --   --   --  25  --  24  --  31 35*  GLUCOSE 349*   < > 337*  --  151*  --  130*  --  122* 123*  BUN 14   < > 16  --  12  --  14  --  18 10  CREATININE 1.34*   < > 1.00  --  0.94  --  0.93  --  0.80 0.66  CALCIUM 8.7*  --   --   --  8.6*  --  8.1*  --  8.3* 9.0  MG  --   --   --   --  1.5*  --   --  2.0  --  1.9  PHOS  --   --   --   --  1.5*  --   --  2.1*  --  3.0   < > = values in this interval not displayed.   GFR: Estimated Creatinine Clearance: 133.4 mL/min (by C-G formula based on SCr of 0.66 mg/dL). Recent Labs  Lab 2020-06-14 1435 2020/06/14 1440 Jun 14, 2020 2208 06/14/20 0052 06/14/20 1557 06/14/20 1917 06/15/20 1140 06/16/20 0308  WBC 12.2*  --   --  28.1*  --   --  15.3* 15.8*  LATICACIDVEN  --    < > 2.4* 2.4* 1.2 1.1  --   --    < > = values in this interval not displayed.    Liver Function Tests: Recent Labs  Lab 06/14/20 1435 06/14/20 0052 06/15/20 1140  AST 146* 83* 49*  ALT 51* 49* 28  ALKPHOS 133* 115 71  BILITOT 0.5 0.5 0.4  PROT 5.9* 6.1* 5.2*  ALBUMIN 3.2* 3.2* 2.4*   No results for input(s): LIPASE, AMYLASE in the last 168 hours. No results for input(s): AMMONIA in the last 168 hours.  ABG    Component Value Date/Time   PHART 7.351 06/14/2020 0353   PCO2ART 53.6 (H) 06/14/2020 0353   PO2ART 119 (H) 06/14/2020 0353   HCO3 29.6 (H) 06/14/2020 0353   TCO2 31 06/14/2020 0353   ACIDBASEDEF 1.0 06/14/2020 1720   O2SAT 98.0 06/14/2020 0353     Coagulation Profile: Recent Labs  Lab Jun 14, 2020 1435  INR 0.9     Cardiac Enzymes: Recent Labs  Lab 06/14/20 0953 06/14/20 1557 06/15/20 1140  CKTOTAL 641* 570* 193    HbA1C: Hgb A1c MFr Bld  Date/Time Value Ref Range Status  Jun 14, 2020 10:08 PM 5.6 4.8 - 5.6 % Final    Comment:    (NOTE) Pre diabetes:          5.7%-6.4%  Diabetes:              >6.4%  Glycemic control for   <7.0% adults with diabetes     CBG: Recent Labs  Lab 06/16/20 0009 06/16/20 0336 06/16/20 0729 06/16/20 1112 06/16/20 1541  GLUCAP 143* 138* 124* 139* 112*    This patient is critically ill with multiple organ system failure which requires frequent high complexity decision making, assessment, support, evaluation, and titration of therapies. This was completed through the application of advanced monitoring technologies and extensive interpretation of multiple databases. During this encounter critical care time was devoted to patient care services described in this note for 44 minutes.  Steffanie Dunn, DO 06/16/20 7:38 PM [facility]

## 2020-06-17 ENCOUNTER — Inpatient Hospital Stay (HOSPITAL_COMMUNITY): Payer: Self-pay

## 2020-06-17 LAB — POCT I-STAT 7, (LYTES, BLD GAS, ICA,H+H)
Acid-Base Excess: 17 mmol/L — ABNORMAL HIGH (ref 0.0–2.0)
Acid-Base Excess: 14 mmol/L — ABNORMAL HIGH (ref 0.0–2.0)
Acid-Base Excess: 15 mmol/L — ABNORMAL HIGH (ref 0.0–2.0)
Acid-Base Excess: 13 mmol/L — ABNORMAL HIGH (ref 0.0–2.0)
Bicarbonate: 43.9 mmol/L — ABNORMAL HIGH (ref 20.0–28.0)
Bicarbonate: 41.5 mmol/L — ABNORMAL HIGH (ref 20.0–28.0)
Bicarbonate: 43.1 mmol/L — ABNORMAL HIGH (ref 20.0–28.0)
Bicarbonate: 41.7 mmol/L — ABNORMAL HIGH (ref 20.0–28.0)
Calcium, Ion: 1.23 mmol/L (ref 1.15–1.40)
Calcium, Ion: 1.25 mmol/L (ref 1.15–1.40)
Calcium, Ion: 1.28 mmol/L (ref 1.15–1.40)
Calcium, Ion: 1.31 mmol/L (ref 1.15–1.40)
HCT: 32 % — ABNORMAL LOW (ref 39.0–52.0)
HCT: 35 % — ABNORMAL LOW (ref 39.0–52.0)
HCT: 36 % — ABNORMAL LOW (ref 39.0–52.0)
HCT: 37 % — ABNORMAL LOW (ref 39.0–52.0)
Hemoglobin: 10.9 g/dL — ABNORMAL LOW (ref 13.0–17.0)
Hemoglobin: 11.9 g/dL — ABNORMAL LOW (ref 13.0–17.0)
Hemoglobin: 12.2 g/dL — ABNORMAL LOW (ref 13.0–17.0)
Hemoglobin: 12.6 g/dL — ABNORMAL LOW (ref 13.0–17.0)
O2 Saturation: 100 %
O2 Saturation: 97 %
O2 Saturation: 98 %
O2 Saturation: 98 %
Patient temperature: 99.7
Patient temperature: 99.7
Patient temperature: 99.7
Patient temperature: 99.7
Potassium: 3.2 mmol/L — ABNORMAL LOW (ref 3.5–5.1)
Potassium: 3.1 mmol/L — ABNORMAL LOW (ref 3.5–5.1)
Potassium: 3.1 mmol/L — ABNORMAL LOW (ref 3.5–5.1)
Potassium: 3.2 mmol/L — ABNORMAL LOW (ref 3.5–5.1)
Sodium: 148 mmol/L — ABNORMAL HIGH (ref 135–145)
Sodium: 148 mmol/L — ABNORMAL HIGH (ref 135–145)
Sodium: 149 mmol/L — ABNORMAL HIGH (ref 135–145)
Sodium: 148 mmol/L — ABNORMAL HIGH (ref 135–145)
TCO2: 46 mmol/L — ABNORMAL HIGH (ref 22–32)
TCO2: 43 mmol/L — ABNORMAL HIGH (ref 22–32)
TCO2: 45 mmol/L — ABNORMAL HIGH (ref 22–32)
TCO2: 44 mmol/L — ABNORMAL HIGH (ref 22–32)
pCO2 arterial: 64.1 mmHg — ABNORMAL HIGH (ref 32.0–48.0)
pCO2 arterial: 66.4 mmHg (ref 32.0–48.0)
pCO2 arterial: 76.6 mmHg (ref 32.0–48.0)
pCO2 arterial: 78.9 mmHg (ref 32.0–48.0)
pH, Arterial: 7.446 (ref 7.350–7.450)
pH, Arterial: 7.362 (ref 7.350–7.450)
pH, Arterial: 7.407 (ref 7.350–7.450)
pH, Arterial: 7.335 — ABNORMAL LOW (ref 7.350–7.450)
pO2, Arterial: 293 mmHg — ABNORMAL HIGH (ref 83.0–108.0)
pO2, Arterial: 104 mmHg (ref 83.0–108.0)
pO2, Arterial: 98 mmHg (ref 83.0–108.0)
pO2, Arterial: 114 mmHg — ABNORMAL HIGH (ref 83.0–108.0)

## 2020-06-17 LAB — BASIC METABOLIC PANEL
Anion gap: 7 (ref 5–15)
BUN: 13 mg/dL (ref 6–20)
CO2: 39 mmol/L — ABNORMAL HIGH (ref 22–32)
Calcium: 8.5 mg/dL — ABNORMAL LOW (ref 8.9–10.3)
Chloride: 103 mmol/L (ref 98–111)
Creatinine, Ser: 0.63 mg/dL (ref 0.61–1.24)
GFR, Estimated: >60 mL/min (ref >60)
Glucose, Bld: 99 mg/dL (ref 70–99)
Potassium: 3.7 mmol/L (ref 3.5–5.1)
Sodium: 149 mmol/L — ABNORMAL HIGH (ref 135–145)

## 2020-06-17 LAB — GLUCOSE, CAPILLARY
Glucose-Capillary: 105 mg/dL — ABNORMAL HIGH (ref 70–99)
Glucose-Capillary: 148 mg/dL — ABNORMAL HIGH (ref 70–99)
Glucose-Capillary: 71 mg/dL (ref 70–99)
Glucose-Capillary: 86 mg/dL (ref 70–99)

## 2020-06-17 LAB — MAGNESIUM: Magnesium: 1.9 mg/dL (ref 1.7–2.4)

## 2020-06-17 LAB — TRIGLYCERIDES: Triglycerides: 102 mg/dL (ref <150)

## 2020-06-17 LAB — PHOSPHORUS
Phosphorus: <1 mg/dL — CL (ref 2.5–4.6)
Phosphorus: 3.5 mg/dL (ref 2.5–4.6)

## 2020-06-17 MED ORDER — GLYCOPYRROLATE 0.2 MG/ML IJ SOLN: 0.2000 mg | INTRAMUSCULAR | Status: DC | PRN

## 2020-06-17 MED ORDER — ACETAMINOPHEN 325 MG PO TABS: 650.0000 mg | ORAL_TABLET | Freq: Four times a day (QID) | ORAL | Status: DC | PRN

## 2020-06-17 MED ORDER — ACETAMINOPHEN 650 MG RE SUPP: 650.0000 mg | Freq: Four times a day (QID) | RECTAL | Status: DC | PRN

## 2020-06-17 MED ORDER — DIPHENHYDRAMINE HCL 50 MG/ML IJ SOLN: 25.0000 mg | INTRAMUSCULAR | Status: DC | PRN

## 2020-06-17 MED ORDER — POLYVINYL ALCOHOL 1.4 % OP SOLN
1.0000 [drp] | Freq: Four times a day (QID) | OPHTHALMIC | Status: DC | PRN
  Filled 2020-06-17: qty 15

## 2020-06-17 MED ORDER — POTASSIUM CHLORIDE 20 MEQ PO PACK
40.0000 meq | PACK | Freq: Once | ORAL | Status: AC
  Administered 2020-06-17: 40 meq
  Filled 2020-06-17: qty 2

## 2020-06-17 MED ORDER — LACTATED RINGERS IV BOLUS
1000.0000 mL | Freq: Once | INTRAVENOUS | Status: AC
  Administered 2020-06-17: 1000 mL via INTRAVENOUS

## 2020-06-17 MED ORDER — GLYCOPYRROLATE 1 MG PO TABS
1.0000 mg | ORAL_TABLET | ORAL | Status: DC | PRN
  Filled 2020-06-17: qty 1

## 2020-06-17 MED ORDER — SODIUM PHOSPHATES 45 MMOLE/15ML IV SOLN
30.0000 mmol | Freq: Once | INTRAVENOUS | Status: AC
  Administered 2020-06-17: 30 mmol via INTRAVENOUS
  Filled 2020-06-17: qty 10

## 2020-06-17 MED ORDER — SODIUM CHLORIDE 0.9 % IV BOLUS
1000.0000 mL | Freq: Once | INTRAVENOUS | Status: AC
  Administered 2020-06-17: 1000 mL via INTRAVENOUS

## 2020-06-18 LAB — CULTURE, BLOOD (ROUTINE X 2)
Culture: NO GROWTH
Culture: NO GROWTH
Special Requests: ADEQUATE
Special Requests: ADEQUATE

## 2020-07-08 NOTE — Progress Notes (Signed)
Notified by [facility] staff that their medical director has ruled patient out as candidate for organ donation, they request [place] to call them with cardiac time of death. Dr Katrinka Blazing notified that [facility] is no longer pursuing organ donation.

## 2020-07-08 NOTE — Death Summary Note (Signed)
DEATH SUMMARY   Patient Details  Name: Oscar Burke MRN: [id number] DOB: 1981/06/13  Admission/Discharge Information   Admit Date:  2020-06-23  Date of Death: Date of Death: 2020-06-27  Time of Death: Time of Death: Jan 15, 2012  Length of Stay: 4  Referring Physician: Patient, No Pcp Per   Reason(s) for Hospitalization  Cardiac arrest in setting of recurrent overdoses History of infective endocarditis Acute hypoxemic respiratory failure due to aspiration pneumonitis from above  Diagnoses  Preliminary cause of death:  Secondary Diagnoses (including complications and co-morbidities):  Active Problems:   Cardiac arrest [facility])   Brief Hospital Course (including significant findings, care, treatment, and services provided and events leading to death)  Mr. Keller is a 39 year old gentleman with a history of substance abuse, previous endocarditis who presented post cardiac arrest.  His girlfriend found him unresponsive.  When EMS arrived at 1:59M he was in asystole and CPR was initiated.  ROSC was achieved within about 10 minutes.  He had an episode of an overdose treated with Narcan by EMS 2 days ago, but he refused hospital transfer.  Upon transfer to the ED, he was dyssynchronous on the ventilator and initiated on propofol.  No purposeful activity.  Patient progressed to brain death by physical exam but had worsening oxygenation precluding safe confirmatory testing.  After discussions with family his body was allowed to pass in peace.   Pertinent Labs and Studies  Significant Diagnostic Studies EEG  Result Date: June 23, 2020 Charlsie Quest, MD     06/23/2020  6:32 PM Patient Name: Oscar Burke MRN: [id number] Epilepsy Attending: Charlsie Quest Referring Physician/Provider: Felicie Morn, PA Date: 2020-06-23 Duration: 25.04 mins Patient history: 39yo M with cardiac arrest. EEG to evaluate for seizure Level of alertness:  comatose AEDs during EEG study: None Technical aspects: This EEG  study was done with scalp electrodes positioned according to the 10-20 International system of electrode placement. Electrical activity was acquired at a sampling rate of  and reviewed with a high frequency filter of  and a low frequency filter of . EEG data were recorded continuously and digitally stored. Description: EEG showed frequent episodes of spontaneous eye opening with axial jerks. Concomitant eeg showed generalized polsypikes consistent with myoclonic seizure. EEG also showed generalized background suppression. Hyperventilation and photic stimulation were not performed.   ABNORMALITY - Myoclonic seizure, generalized - Background suppression, generalized IMPRESSION: This study showed frequent myoclonic seizures as well as profound diffuse encephalopoathy consistent with anoxic/hypoxic brain injury. Dr Otelia Limes was notifed. Charlsie Quest   CT HEAD WO CONTRAST  Result Date: 06/15/2020 CLINICAL DATA:  Found unresponsive.  Assess for anoxic brain injury. EXAM: CT HEAD WITHOUT CONTRAST TECHNIQUE: Contiguous axial images were obtained from the base of the skull through the vertex without intravenous contrast. COMPARISON:  Jun 23, 2020 FINDINGS: Brain: Generalized brain swelling with effacement of the subarachnoid spaces. "Pseudo subarachnoid hemorrhage" sign. Findings consistent with diffuse hypoxic ischemic brain injury. No hemorrhage, hydrocephalus or extra-axial collection. Vascular: No primary vascular finding presently. Skull: Normal Sinuses/Orbits: Clear/normal Other: None IMPRESSION: Generalized brain swelling with effacement of the subarachnoid spaces. Findings consistent with diffuse hypoxic ischemic brain injury. Electronically Signed   By: Paulina Fusi M.D.   On: 06/15/2020 11:25   CT Head Wo Contrast  Result Date: 06/23/20 CLINICAL DATA:  Recent head and neck trauma, found unresponsive EXAM: CT HEAD WITHOUT CONTRAST CT CERVICAL SPINE WITHOUT CONTRAST TECHNIQUE: Multidetector CT  imaging of the head and cervical spine was performed following the standard protocol without  intravenous contrast. Multiplanar CT image reconstructions of the cervical spine were also generated. COMPARISON:  11/19/2017 FINDINGS: CT HEAD FINDINGS Brain: Patient is somewhat tilted in the gantry causing some artifact. Vague area of decreased attenuation is noted in the left cerebellar hemisphere. Given the patient's clinical history and acute evolving infarct could not be totally excluded. This is however more likely related to the patient tilting in the gantry. No other findings to suggest acute hemorrhage, acute infarction or space-occupying mass lesion are noted. Vascular: No hyperdense vessel or unexpected calcification. Skull: Normal. Negative for fracture or focal lesion. Sinuses/Orbits: Mucosal thickening is noted within the maxillary antra bilaterally. Prior surgical changes are noted in the maxillary antra. Other: Note is made of gastric catheter coiled within the posterior oropharynx and nasopharynx. CT CERVICAL SPINE FINDINGS Alignment: Within normal limits. Skull base and vertebrae: 7 cervical segments are well visualized. Vertebral body height is well maintained. No acute fracture or acute facet abnormality is noted. Soft tissues and spinal canal: Surrounding soft tissue structures are within normal limits. Upper chest: Visualized lung apices show some mild apical scarring bilaterally. This is new from the prior CT examination. Other: Endotracheal tube is noted in satisfactory position. Gastric catheter is noted coiled within the oro and nasopharynx. IMPRESSION: CT of the head: Somewhat limited exam due the patient tilting in the gantry. Vague area of decreased attenuation within the left cerebellar hemisphere. This is likely related to patient tilting although an involving infarct cannot be totally excluded. The need for further workup can be determined on a clinical basis. CT of the cervical spine: No  acute bony abnormality noted. Gastric catheter coiled within the oropharynx and nasopharynx as described. Electronically Signed   By: Alcide Clever M.D.   On: 07/04/2020 15:19   CT Cervical Spine Wo Contrast  Result Date: 06/16/2020 CLINICAL DATA:  Recent head and neck trauma, found unresponsive EXAM: CT HEAD WITHOUT CONTRAST CT CERVICAL SPINE WITHOUT CONTRAST TECHNIQUE: Multidetector CT imaging of the head and cervical spine was performed following the standard protocol without intravenous contrast. Multiplanar CT image reconstructions of the cervical spine were also generated. COMPARISON:  11/19/2017 FINDINGS: CT HEAD FINDINGS Brain: Patient is somewhat tilted in the gantry causing some artifact. Vague area of decreased attenuation is noted in the left cerebellar hemisphere. Given the patient's clinical history and acute evolving infarct could not be totally excluded. This is however more likely related to the patient tilting in the gantry. No other findings to suggest acute hemorrhage, acute infarction or space-occupying mass lesion are noted. Vascular: No hyperdense vessel or unexpected calcification. Skull: Normal. Negative for fracture or focal lesion. Sinuses/Orbits: Mucosal thickening is noted within the maxillary antra bilaterally. Prior surgical changes are noted in the maxillary antra. Other: Note is made of gastric catheter coiled within the posterior oropharynx and nasopharynx. CT CERVICAL SPINE FINDINGS Alignment: Within normal limits. Skull base and vertebrae: 7 cervical segments are well visualized. Vertebral body height is well maintained. No acute fracture or acute facet abnormality is noted. Soft tissues and spinal canal: Surrounding soft tissue structures are within normal limits. Upper chest: Visualized lung apices show some mild apical scarring bilaterally. This is new from the prior CT examination. Other: Endotracheal tube is noted in satisfactory position. Gastric catheter is noted coiled  within the oro and nasopharynx. IMPRESSION: CT of the head: Somewhat limited exam due the patient tilting in the gantry. Vague area of decreased attenuation within the left cerebellar hemisphere. This is likely related to patient tilting  although an involving infarct cannot be totally excluded. The need for further workup can be determined on a clinical basis. CT of the cervical spine: No acute bony abnormality noted. Gastric catheter coiled within the oropharynx and nasopharynx as described. Electronically Signed   By: Alcide Clever M.D.   On: 06/10/2020 15:19   DG CHEST PORT 1 VIEW  Result Date: 06/23/20 CLINICAL DATA:  Hypoxia EXAM: PORTABLE CHEST 1 VIEW COMPARISON:  06/15/2020 FINDINGS: Endotracheal tube, gastric catheter and esophageal probe are noted in satisfactory position. Right lung is well aerated and clear. New significant opacification in the base of the left lung is noted likely representing a combination of mucous plugging/consolidation and pleural effusion. No bony abnormality is noted. IMPRESSION: New opacification the left lung base consistent with effusion and consolidation. Tubes and lines as described above. Electronically Signed   By: Alcide Clever M.D.   On: 2020-06-23 15:30   DG Chest Port 1 View  Result Date: 06/15/2020 CLINICAL DATA:  Acute respiratory failure. EXAM: PORTABLE CHEST 1 VIEW COMPARISON:  06/23/2020; 05/10/2019 FINDINGS: Grossly unchanged cardiac silhouette and mediastinal contours. Interval removal of pericardial pacer leads. Interval advancement enteric tube with side port now projected slight leak caudal to the gastroesophageal junction. Presumed esophageal pH probe overlies the mid aspect of the esophagus. Stable positioning of endotracheal tube with tip positioned above the level of the carina. No pneumothorax. Lungs remain well aerated. No pleural effusion or pneumothorax. No evidence of edema. No acute osseous abnormalities. IMPRESSION: 1. Appropriate position  of support apparatus as above. No pneumothorax. 2.  No acute cardiopulmonary disease. Electronically Signed   By: Simonne Come M.D.   On: 06/15/2020 10:57   DG Chest Portable 1 View  Result Date: 06/25/2020 CLINICAL DATA:  ETT insertion. EXAM: PORTABLE CHEST 1 VIEW COMPARISON:  05/10/2019 FINDINGS: Single AP view of the chest. Endotracheal tube terminates 5.1 cm above carina. Nasogastric tube extends beyond the inferior aspect of the film. External pacer/defibrillator. The low chest is excluded. Midline trachea. Borderline cardiomegaly. No large pleural effusion. No pneumothorax. No congestive failure. No lobar consolidation. IMPRESSION: Limited exam, with the low chest excluded. Appropriate position of endotracheal tube. No acute cardiopulmonary disease. Electronically Signed   By: Jeronimo Greaves M.D.   On: 06/10/2020 14:51   Overnight EEG with video  Result Date: 06/14/2020 Charlsie Quest, MD     06/15/2020  6:49 AM Patient Name: Harlis Champoux MRN: [id number] Epilepsy Attending: Charlsie Quest Referring Physician/Provider:  Dr. Caryl Pina Duration:  07/07/2020 1832 to 06/14/2020 1832  Patient history: 39yo M with cardiac arrest. EEG to evaluate for seizure  Level of alertness:  comatose  AEDs during EEG study: None  Technical aspects: This EEG study was done with scalp electrodes positioned according to the 10-20 International system of electrode placement. Electrical activity was acquired at a sampling rate of  and reviewed with a high frequency filter of  and a low frequency filter of . EEG data were recorded continuously and digitally stored. Description: EEG initially showed frequent episodes of spontaneous eye opening with axial jerks, avg per 30 secs. Concomitant eeg showed high amplitude generalized sharply contoured 3-5hz  thet-delta slowing consistent with myoclonic seizure.  After 1:30 AM on 06/14/2020, the frequency of myoclonic seizures improved but again started worsening  after around 4 AM. EEG also showed generalized background suppression between myoclonic jerks. ABNORMALITY - Myoclonic seizure, generalized - Backgrund suppression, generalized  IMPRESSION: This study showed frequent myoclonic seizures as well as profound diffuse  encephalopoathy consistent with anoxic/hypoxic brain injury.  Charlsie Quest   ECHOCARDIOGRAM COMPLETE  Result Date: 06/14/2020    ECHOCARDIOGRAM REPORT   Patient Name:   GOLDMAN BIRCHALL Date of Exam: 06/14/2020 Medical Rec #:  [id number]        Height:       72.0 in Accession #:    [id number]       Weight:       165.3 lb Date of Birth:  09/25/1980        BSA:          1.965 m Patient Age:    39 years         BP:           79/49 mmHg Patient Gender: M                HR:           84 bpm. Exam Location:  Inpatient Procedure: 2D Echo, 3D Echo, Cardiac Doppler and Color Doppler Indications:    R01.1 Murmur  History:        Patient has prior history of Echocardiogram examinations, most                 recent 06/13/2018. Endocarditis and TV vegetation,                 Arrythmias:Cardiac Arrest; Signs/Symptoms:Fever. IVDU. Opiate                 overdose. Poly substance abuse. Hypoxia.  Sonographer:    Sheralyn Boatman RDCS Referring Phys: [id number] [facility]  Sonographer Comments: Technically difficult study due to poor echo windows and echo performed with patient supine and on artificial respirator. Patient on vent and EEG. Could not turn. IMPRESSIONS  1. Left ventricular ejection fraction, by estimation, is 35 to 40%. The left ventricle has moderately decreased function. The left ventricle demonstrates global hypokinesis. There is mild concentric left ventricular hypertrophy. Left ventricular diastolic parameters were normal.  2. Right ventricular systolic function is normal. The right ventricular size is normal. There is normal pulmonary artery systolic pressure.  3. The mitral valve is normal in structure. Trivial mitral valve regurgitation. No evidence of  mitral stenosis.  4. There is a mass on the tricuspid valve that likely represents a vegetation .     3 D images of the tricuspid valve and the vegetation were obtained.     . Tricuspid valve regurgitation is moderate to severe.  5. The aortic valve is normal in structure. Aortic valve regurgitation is not visualized. No aortic stenosis is present. FINDINGS  Left Ventricle: Left ventricular ejection fraction, by estimation, is 35 to 40%. The left ventricle has moderately decreased function. The left ventricle demonstrates global hypokinesis. The left ventricular internal cavity size was normal in size. There is mild concentric left ventricular hypertrophy. Left ventricular diastolic parameters were normal. Right Ventricle: The right ventricular size is normal. No increase in right ventricular wall thickness. Right ventricular systolic function is normal. There is normal pulmonary artery systolic pressure. The tricuspid regurgitant velocity is 2.12 m/s, and  with an assumed right atrial pressure of 8 mmHg, the estimated right ventricular systolic pressure is 26.0 mmHg. Left Atrium: Left atrial size was normal in size. Right Atrium: Right atrial size was normal in size. Pericardium: There is no evidence of pericardial effusion. Mitral Valve: The mitral valve is normal in structure. Trivial mitral valve regurgitation. No evidence of mitral valve stenosis. Tricuspid Valve: There  is a mass on the tricuspid valve that likely represents a vegetation . 3 D images of the tricuspid valve and the vegetation were obtained. The tricuspid valve is grossly normal. Tricuspid valve regurgitation is moderate to severe. Aortic Valve: The aortic valve is normal in structure. Aortic valve regurgitation is not visualized. No aortic stenosis is present. Pulmonic Valve: The pulmonic valve was normal in structure. Pulmonic valve regurgitation is not visualized. Aorta: The aortic root and ascending aorta are structurally normal, with no  evidence of dilitation. IAS/Shunts: The atrial septum is grossly normal.  LEFT VENTRICLE PLAX 2D LVIDd:         5.10 cm      Diastology LVIDs:         4.10 cm      LV e' medial:    10.90 cm/s LV PW:         1.20 cm      LV E/e' medial:  7.8 LV IVS:        1.20 cm      LV e' lateral:   12.50 cm/s LVOT diam:     2.10 cm      LV E/e' lateral: 6.8 LV SV:         59 LV SV Index:   30 LVOT Area:     3.46 cm  LV Volumes (MOD) LV vol d, MOD A2C: 152.0 ml LV vol d, MOD A4C: 107.0 ml LV vol s, MOD A2C: 94.1 ml LV vol s, MOD A4C: 61.5 ml LV SV MOD A2C:     57.9 ml LV SV MOD A4C:     107.0 ml LV SV MOD BP:      53.2 ml RIGHT VENTRICLE             IVC RV S prime:     14.50 cm/s  IVC diam: 2.80 cm TAPSE (M-mode): 1.9 cm LEFT ATRIUM             Index       RIGHT ATRIUM           Index LA diam:        4.30 cm 2.19 cm/m  RA Area:     22.00 cm LA Vol (A2C):   21.6 ml 10.99 ml/m RA Volume:   83.50 ml  42.49 ml/m LA Vol (A4C):   25.0 ml 12.72 ml/m LA Biplane Vol: 24.0 ml 12.21 ml/m  AORTIC VALVE LVOT Vmax:   87.90 cm/s LVOT Vmean:  65.200 cm/s LVOT VTI:    0.171 m  AORTA Ao Root diam: 3.60 cm MITRAL VALVE               TRICUSPID VALVE MV Area (PHT): 6.17 cm    TR Peak grad:   18.0 mmHg MV Decel Time: 123 msec    TR Vmax:        212.00 cm/s MV E velocity: 84.90 cm/s MV A velocity: 48.50 cm/s  SHUNTS MV E/A ratio:  1.75        Systemic VTI:  0.17 m                            Systemic Diam: 2.10 cm Kristeen Miss MD Electronically signed by Kristeen Miss MD Signature Date/Time: 06/14/2020/11:18:55 AM    Final     Microbiology Recent Results (from the past 240 hour(s))  Respiratory Panel by RT PCR (Flu A&B, Covid) - Nasopharyngeal Swab  Status: None   Collection Time: 2020-07-04  2:40 PM   Specimen: Nasopharyngeal Swab  Result Value Ref Range Status   SARS Coronavirus 2 by RT PCR NEGATIVE NEGATIVE Final    Comment: (NOTE) SARS-CoV-2 target nucleic acids are NOT DETECTED.  The SARS-CoV-2 RNA is generally detectable in  upper respiratoy specimens during the acute phase of infection. The lowest concentration of SARS-CoV-2 viral copies this assay can detect is 131 copies/mL. A negative result does not preclude SARS-Cov-2 infection and should not be used as the sole basis for treatment or other patient management decisions. A negative result may occur with  improper specimen collection/handling, submission of specimen other than nasopharyngeal swab, presence of viral mutation(s) within the areas targeted by this assay, and inadequate number of viral copies (<131 copies/mL). A negative result must be combined with clinical observations, patient history, and epidemiological information. The expected result is Negative.  Fact Sheet for Patients:  https://www.moore.com/  Fact Sheet for Healthcare Providers:  https://www.young.biz/  This test is no t yet approved or cleared by the [place] FDA and  has been authorized for detection and/or diagnosis of SARS-CoV-2 by FDA under an Emergency Use Authorization (EUA). This EUA will remain  in effect (meaning this test can be used) for the duration of the COVID-19 declaration under Section 564(b)(1) of the Act, 21 U.S.C. section 360bbb-3(b)(1), unless the authorization is terminated or revoked sooner.     Influenza A by PCR NEGATIVE NEGATIVE Final   Influenza B by PCR NEGATIVE NEGATIVE Final    Comment: (NOTE) The Xpert Xpress SARS-CoV-2/FLU/RSV assay is intended as an aid in  the diagnosis of influenza from Nasopharyngeal swab specimens and  should not be used as a sole basis for treatment. Nasal washings and  aspirates are unacceptable for Xpert Xpress SARS-CoV-2/FLU/RSV  testing.  Fact Sheet for Patients: https://www.moore.com/  Fact Sheet for Healthcare Providers: https://www.young.biz/  This test is not yet approved or cleared by the [place] FDA and  has been  authorized for detection and/or diagnosis of SARS-CoV-2 by  FDA under an Emergency Use Authorization (EUA). This EUA will remain  in effect (meaning this test can be used) for the duration of the  Covid-19 declaration under Section 564(b)(1) of the Act, 21  U.S.C. section 360bbb-3(b)(1), unless the authorization is  terminated or revoked. Performed at [facility], [address], [place], [place] [address]   MRSA PCR Screening     Status: None   Collection Time: Jul 04, 2020  6:10 PM   Specimen: Nasopharyngeal  Result Value Ref Range Status   MRSA by PCR NEGATIVE NEGATIVE Final    Comment:        The GeneXpert MRSA Assay (FDA approved for NASAL specimens only), is one component of a comprehensive MRSA colonization surveillance program. It is not intended to diagnose MRSA infection nor to guide or monitor treatment for MRSA infections. Performed at [facility], [address] [place], [place] [address]   Culture, blood (routine x 2)     Status: None   Collection Time: 2020-07-04  6:42 PM   Specimen: BLOOD LEFT ARM  Result Value Ref Range Status   Specimen Description BLOOD LEFT ARM  Final   Special Requests AEROBIC BOTTLE ONLY Blood Culture adequate volume  Final   Culture   Final    NO GROWTH 5 DAYS Performed at [facility], [address], [place], [place] [address]    Report Status 06/18/2020 FINAL  Final  Culture, blood (routine x 2)     Status: None   Collection Time: 06/12/2020  6:43 PM   Specimen: BLOOD LEFT HAND  Result Value Ref Range Status   Specimen Description BLOOD LEFT HAND  Final   Special Requests   Final    BOTTLES DRAWN AEROBIC AND ANAEROBIC Blood Culture adequate volume   Culture   Final    NO GROWTH 5 DAYS Performed at [facility], [address], [place], [place] [address]    Report Status 06/18/2020 FINAL  Final    Lab Basic Metabolic Panel: Recent Labs  Lab 06/14/20 0052 06/14/20 0353 06/14/20 0953 06/14/20 0953  06/15/20 0036 06/15/20 1140 06/15/20 1140 06/16/20 0308 06/16/20 0308 06-24-2020 0258 Jun 24, 2020 0751 2020-06-24 0900 06/24/20 0927 06/24/20 1051 2020/06/24 1641  NA 138   < > 137   < >  --  140   < > 146*   < > 149* 148* 149* 148* 148*  --   K 4.3   < > 4.8   < >  --  4.4   < > 4.8   < > 3.7 3.2* 3.1* 3.1* 3.2*  --   CL 103  --  104  --   --  102  --  104  --  103  --   --   --   --   --   CO2 25  --  24  --   --  31  --  35*  --  39*  --   --   --   --   --   GLUCOSE 151*  --  130*  --   --  122*  --  123*  --  99  --   --   --   --   --   BUN 12  --  14  --   --  18  --  10  --  13  --   --   --   --   --   CREATININE 0.94  --  0.93  --   --  0.80  --  0.66  --  0.63  --   --   --   --   --   CALCIUM 8.6*  --  8.1*  --   --  8.3*  --  9.0  --  8.5*  --   --   --   --   --   MG 1.5*  --   --   --  2.0  --   --  1.9  --  1.9  --   --   --   --   --   PHOS 1.5*  --   --   --  2.1*  --   --  3.0  --  <1.0*  --   --   --   --  3.5   < > = values in this interval not displayed.   Liver Function Tests: Recent Labs  Lab 07/06/2020 1435 06/14/20 0052 06/15/20 1140  AST 146* 83* 49*  ALT 51* 49* 28  ALKPHOS 133* 115 71  BILITOT 0.5 0.5 0.4  PROT 5.9* 6.1* 5.2*  ALBUMIN 3.2* 3.2* 2.4*   No results for input(s): LIPASE, AMYLASE in the last 168 hours. No results for input(s): AMMONIA in the last 168 hours. CBC: Recent Labs  Lab 06/18/2020 1435 07/04/2020 1452 06/14/20 0052 06/14/20 0353 06/15/20 1140 06/15/20 1140 06/16/20 0308  07/04/2020 0751 06/09/2020 0900 06/26/2020 0927 06/27/2020 1051  WBC 12.2*  --  28.1*  --  15.3*  --  15.8*  --   --   --   --   NEUTROABS 6.7  --   --   --   --   --   --   --   --   --   --   HGB 14.1   < > 15.2   < > 12.1*   < > 14.1 10.9* 11.9* 12.2* 12.6*  HCT 47.7   < > 47.3   < > 38.6*   < > 46.0 32.0* 35.0* 36.0* 37.0*  MCV 95.6  --  90.4  --  90.8  --  94.7  --   --   --   --   PLT 262  --  320  --  194  --  217  --   --   --   --    < > = values in this  interval not displayed.   Cardiac Enzymes: Recent Labs  Lab 06/14/20 0953 06/14/20 1557 06/15/20 1140  CKTOTAL 641* 570* 193   Sepsis Labs: Recent Labs  Lab Feb 25, 2020 1435 Feb 25, 2020 1440 Feb 25, 2020 2208 06/14/20 0052 06/14/20 1557 06/14/20 1917 06/15/20 1140 06/16/20 0308  WBC 12.2*  --   --  28.1*  --   --  15.3* 15.8*  LATICACIDVEN  --    < > 2.4* 2.4* 1.2 1.1  --   --    < > = values in this interval not displayed.    Lorin GlassDaniel C Calli Bashor 06/18/2020, 12:45 PM

## 2020-07-08 NOTE — Progress Notes (Signed)
NAME:  Oscar Burke, MRN:  [id number], DOB:  1980-09-11, LOS: 4 ADMISSION DATE:  10-Jul-2020, CONSULTATION DATE:  Jul 10, 2020 REFERRING MD:  Dr. Criss Alvine, CHIEF COMPLAINT:  Cardiac arrest   Brief History   39 year old male found down and unresponsive, on EMS arrival patient was seen in pulseless, apneic, and asystolic requiring approximately 10 minutes of CPR with 2 rounds of epinephrine prior to return of spontaneous circulation.  Unknown downtime prior to initiation of CPR.  PCCM consulted for further management and admission.  History of present illness   Oscar Burke is a 39 year old male with a past medical history significant for bipolar disease, chronic pain, hepatitis C, polysubstance abuse, and current smoker who was found down and unresponsive.  On EMS arrival patient was seen pulseless, apneic, and asystolic requiring 10 minutes of CPR and 2 rounds of epinephrine before achieving ROSC unknown downtime prior to initiation of CPR.  Patient was intubated on ED arrival.  Vital signs significant on arrival for tachycardia and hypertension.  Majority of lab work pending at time of assessment however abnormalities included lactate of 8.2 glucose of 337, and WBC 12.2.  Initial chest x-ray negative for any acute abnormalities. CT head with questionable decreased attenuation within the left cerebral hemisphere likely related to patient positioning but evolving infarct cannot be excluded.  Past Medical History  Bipolar disease Chronic pain Hepatitis C Polysubstance abuse Current smoker  Significant Hospital Events     Consults:  PCCM Neuro  Procedures:  ETT 10/7 >  Significant Diagnostic Tests:  Chest x-ray 10/7 >negative for any acute abnormalities.   CT head 10/7 > questionable decreased attenuation within the left cerebral hemisphere likely related to patient positioning but evolving infarct cannot be excluded.  TTE: LVEF 35-40%, tricuspid vegetation  10/9> EEG with  generalized myoclonic seizures, profound diffuse encephalopathy consistent with anoxic brain injury. Worse from prior EEG   Micro Data:  COVID-19 10/7> neg  BCx 10/7> no growth at 2 days   Antimicrobials:  Unasyn 10/7- Vanc 10/7-  Interim history/subjective:  No events.  Remains unresponsive.  Objective   Blood pressure 116/60, pulse 69, temperature 99.7 F (37.6 C), resp. rate 18, height 6' (1.829 m), weight 84.3 kg, SpO2 100 %.    Vent Mode: PRVC FiO2 (%):  [40 %-100 %] 60 % Set Rate:  [18 bmp-22 bmp] 18 bmp Vt Set:  [620 mL] 620 mL PEEP:  [5 cmH20] 5 cmH20 Plateau Pressure:  [12 cmH20-20 cmH20] 20 cmH20   Intake/Output Summary (Last 24 hours) at 06/08/2020 0835 Last data filed at 06/23/2020 0600 Gross per 24 hour  Intake 1488.34 ml  Output 810 ml  Net 678.34 ml   Filed Weights   06/15/20 0500 06/16/20 0500 06/26/2020 0615  Weight: 87 kg 83.3 kg 84.3 kg    Examination: Middle aged man comatose Pupils fixed, dilated No pupillary reflex No corneal reflex No cough/gag/doll's No vestibulo-ocular reflex GCS 3   Resolved Hospital Problem list     Assessment & Plan:   PEA arrest - found down unconscious for unknown period of time. Due to hypoxia d/t opiate overdose.  CPR, 2 epi. 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 Critical Care Time devoted to patient care services described in this note independent of APP/resident time (if applicable)  is 33 minutes.   Myrla Halsted MD [facility] June 28, 2020 8:44 AM Personal pager: [phone] If unanswered, please page CCM On-call: #[phone]

## 2020-07-08 NOTE — Progress Notes (Signed)
eLink Physician-Brief Progress Note Patient Name: Oscar Burke DOB: 11-27-1980 MRN: [id number]   Date of Service  Jul 05, 2020  HPI/Events of Note  Hypophosphatemia - PO4--- < 1.0 and Creatinine = 0.63.   eICU Interventions  Plan: 1. Replace PO4---. 2. Repeat PO4--- level at 2 PM.      Intervention Category Major Interventions: Electrolyte abnormality - evaluation and management  [name] 07/05/20, 5:50 AM

## 2020-07-08 NOTE — Procedures (Signed)
Bronchoscopy Procedure Note  Mandell Pangborn  [id number]  04-11-1981  Date:06/23/2020  Time:4:45 PM   Provider Performing:Danyal Whitenack C Katrinka Blazing   Procedure(s):  Initial Therapeutic Aspiration of Tracheobronchial Tree [phone])  Indication(s) LLL collapse  Consent Unable to obtain consent due to emergent nature of procedure.  Anesthesia None   Time Out Verified patient identification, verified procedure, site/side was marked, verified correct patient position, special equipment/implants available, medications/allergies/relevant history reviewed, required imaging and test results available.   Sterile Technique Usual hand hygiene, masks, gowns, and gloves were used   Procedure Description Bronchoscope advanced through endotracheal tube and into airway.  Airways were examined down to subsegmental level with findings noted below.   Following diagnostic evaluation, Therapeutic aspiration performed in LLL  Findings:  - ETT in good position - Mucus plugging bilaterally, worst in LLL, suctioned down as far as able, unfortunately saturations did not improve   Complications/Tolerance None; patient tolerated the procedure well. Chest X-ray is not needed post procedure.   EBL Minimal   Specimen(s) None

## 2020-07-08 NOTE — Progress Notes (Signed)
Progressively hypoxemic through day precluding ability to perform apnea test.  8 minutes apnea test unfortunately did not quite meet criteria.  Now saturating in 70s to 80s on 100% FiO2, CXR showing progressive LLL collapse confirmed with bronch and bedside US.  Now not stable enough for brain death nuclear scan.  I have called brother and informed him that patient will soon pass away.  No further escalation of care, brother would like called when patient passes.  DNR  Myrla Halsted MD PCCM

## 2020-07-08 NOTE — Progress Notes (Signed)
[facility] notified of time of death. Referral has already been made. Not potential donor.   Ref # [id number]

## 2020-07-08 NOTE — Procedures (Signed)
Extubation Procedure Note  Terminal extubation.   Patient Details:   Name: Oscar Burke DOB: [birth year] MRN: [id number]   Airway Documentation:    Vent end date: 06/25/2020 Vent end time: 1945    Lianne Bushy 25-Jun-2020, 7:59 PM

## 2020-07-08 NOTE — Progress Notes (Signed)
Significant other at bedside, her and brother have been notified of imminent passing. A couple friends coming to visit and then Aksh will be allowed to pass in peace off ventilator.  Myrla Halsted MD

## 2020-07-08 NOTE — Procedures (Signed)
View of left chest shows elevated left hemidiaphragm, no effusion and dense lung consolidation.  Myrla Halsted MD PCCM

## 2020-07-08 DEATH — deceased
